# Patient Record
Sex: Female | Born: 1940
Health system: Southern US, Community
[De-identification: ages and names within clinical notes are randomized; demographics above are authoritative.]

## PROBLEM LIST (undated history)

## (undated) DIAGNOSIS — R519 Headache, unspecified: Secondary | ICD-10-CM

## (undated) DIAGNOSIS — J339 Nasal polyp, unspecified: Secondary | ICD-10-CM

## (undated) DIAGNOSIS — K589 Irritable bowel syndrome without diarrhea: Secondary | ICD-10-CM

## (undated) DIAGNOSIS — Z860100 Personal history of colon polyps, unspecified: Secondary | ICD-10-CM

## (undated) DIAGNOSIS — M419 Scoliosis, unspecified: Secondary | ICD-10-CM

## (undated) DIAGNOSIS — Z87442 Personal history of urinary calculi: Secondary | ICD-10-CM

## (undated) DIAGNOSIS — I2510 Atherosclerotic heart disease of native coronary artery without angina pectoris: Secondary | ICD-10-CM

## (undated) DIAGNOSIS — M7552 Bursitis of left shoulder: Secondary | ICD-10-CM

## (undated) DIAGNOSIS — R079 Chest pain, unspecified: Secondary | ICD-10-CM

## (undated) DIAGNOSIS — G500 Trigeminal neuralgia: Secondary | ICD-10-CM

## (undated) DIAGNOSIS — G9600 Cerebrospinal fluid leak, unspecified: Secondary | ICD-10-CM

## (undated) DIAGNOSIS — R51 Headache: Secondary | ICD-10-CM

## (undated) DIAGNOSIS — E785 Hyperlipidemia, unspecified: Secondary | ICD-10-CM

## (undated) DIAGNOSIS — E538 Deficiency of other specified B group vitamins: Secondary | ICD-10-CM

## (undated) DIAGNOSIS — N6019 Diffuse cystic mastopathy of unspecified breast: Secondary | ICD-10-CM

## (undated) DIAGNOSIS — K5790 Diverticulosis of intestine, part unspecified, without perforation or abscess without bleeding: Secondary | ICD-10-CM

## (undated) DIAGNOSIS — H9313 Tinnitus, bilateral: Secondary | ICD-10-CM

## (undated) DIAGNOSIS — K219 Gastro-esophageal reflux disease without esophagitis: Secondary | ICD-10-CM

## (undated) DIAGNOSIS — T8859XA Other complications of anesthesia, initial encounter: Secondary | ICD-10-CM

## (undated) DIAGNOSIS — M5030 Other cervical disc degeneration, unspecified cervical region: Secondary | ICD-10-CM

## (undated) DIAGNOSIS — T4145XA Adverse effect of unspecified anesthetic, initial encounter: Secondary | ICD-10-CM

## (undated) DIAGNOSIS — J329 Chronic sinusitis, unspecified: Secondary | ICD-10-CM

## (undated) DIAGNOSIS — Z8719 Personal history of other diseases of the digestive system: Secondary | ICD-10-CM

## (undated) DIAGNOSIS — N189 Chronic kidney disease, unspecified: Secondary | ICD-10-CM

## (undated) HISTORY — DX: Hyperlipidemia, unspecified: E78.5

## (undated) HISTORY — PX: NASAL SINUS SURGERY: SHX719

## (undated) HISTORY — DX: Gastro-esophageal reflux disease without esophagitis: K21.9

## (undated) HISTORY — DX: Scoliosis, unspecified: M41.9

## (undated) HISTORY — PX: CATARACT EXTRACTION: SUR2

## (undated) HISTORY — PX: JOINT REPLACEMENT: SHX530

## (undated) HISTORY — DX: Other cervical disc degeneration, unspecified cervical region: M50.30

## (undated) HISTORY — DX: Personal history of colon polyps, unspecified: Z86.0100

## (undated) HISTORY — DX: Nasal polyp, unspecified: J33.9

## (undated) HISTORY — DX: Chest pain, unspecified: R07.9

## (undated) HISTORY — DX: Chronic sinusitis, unspecified: J32.9

## (undated) HISTORY — PX: KIDNEY STONE SURGERY: SHX686

## (undated) HISTORY — PX: EYE SURGERY: SHX253

## (undated) HISTORY — DX: Diffuse cystic mastopathy of unspecified breast: N60.19

## (undated) HISTORY — PX: APPENDECTOMY: SHX54

## (undated) HISTORY — DX: Irritable bowel syndrome, unspecified: K58.9

## (undated) HISTORY — DX: Personal history of colonic polyps: Z86.010

## (undated) HISTORY — PX: ABDOMINAL HYSTERECTOMY: SHX81

## (undated) HISTORY — PX: CARDIAC CATHETERIZATION: SHX172

## (undated) HISTORY — DX: Diverticulosis of intestine, part unspecified, without perforation or abscess without bleeding: K57.90

## (undated) HISTORY — DX: Bursitis of left shoulder: M75.52

---

## 1972-09-14 HISTORY — PX: OTHER SURGICAL HISTORY: SHX169

## 1980-09-14 HISTORY — PX: BACK SURGERY: SHX140

## 2000-09-14 HISTORY — PX: CHOLECYSTECTOMY: SHX55

## 2000-11-01 ENCOUNTER — Encounter: Payer: Self-pay | Admitting: Neurosurgery

## 2000-11-01 ENCOUNTER — Ambulatory Visit (HOSPITAL_COMMUNITY): Admission: RE | Admit: 2000-11-01 | Discharge: 2000-11-01 | Payer: Self-pay | Admitting: Neurosurgery

## 2000-12-07 ENCOUNTER — Encounter: Payer: Self-pay | Admitting: Neurosurgery

## 2000-12-07 ENCOUNTER — Ambulatory Visit (HOSPITAL_COMMUNITY): Admission: RE | Admit: 2000-12-07 | Discharge: 2000-12-07 | Payer: Self-pay | Admitting: Neurosurgery

## 2000-12-20 ENCOUNTER — Encounter: Payer: Self-pay | Admitting: Neurosurgery

## 2000-12-20 ENCOUNTER — Encounter: Admission: RE | Admit: 2000-12-20 | Discharge: 2000-12-20 | Payer: Self-pay | Admitting: Neurosurgery

## 2001-01-03 ENCOUNTER — Encounter: Admission: RE | Admit: 2001-01-03 | Discharge: 2001-01-03 | Payer: Self-pay | Admitting: Neurosurgery

## 2001-01-03 ENCOUNTER — Encounter: Payer: Self-pay | Admitting: Neurosurgery

## 2002-01-23 ENCOUNTER — Encounter: Payer: Self-pay | Admitting: *Deleted

## 2002-01-23 ENCOUNTER — Inpatient Hospital Stay (HOSPITAL_COMMUNITY): Admission: EM | Admit: 2002-01-23 | Discharge: 2002-01-25 | Payer: Self-pay | Admitting: *Deleted

## 2002-09-14 HISTORY — PX: KNEE SURGERY: SHX244

## 2004-08-11 ENCOUNTER — Other Ambulatory Visit: Payer: Self-pay

## 2004-08-11 ENCOUNTER — Ambulatory Visit: Payer: Self-pay | Admitting: Otolaryngology

## 2004-08-12 ENCOUNTER — Ambulatory Visit: Payer: Self-pay | Admitting: Otolaryngology

## 2004-10-03 ENCOUNTER — Ambulatory Visit: Payer: Self-pay | Admitting: Otolaryngology

## 2004-11-04 ENCOUNTER — Ambulatory Visit: Payer: Self-pay

## 2005-04-03 ENCOUNTER — Ambulatory Visit: Payer: Self-pay | Admitting: Unknown Physician Specialty

## 2006-08-10 ENCOUNTER — Encounter: Admission: RE | Admit: 2006-08-10 | Discharge: 2006-08-10 | Payer: Self-pay | Admitting: Orthopedic Surgery

## 2006-09-02 ENCOUNTER — Encounter: Admission: RE | Admit: 2006-09-02 | Discharge: 2006-09-02 | Payer: Self-pay | Admitting: Orthopedic Surgery

## 2007-04-25 ENCOUNTER — Ambulatory Visit: Payer: Self-pay | Admitting: General Practice

## 2007-05-17 ENCOUNTER — Encounter: Admission: RE | Admit: 2007-05-17 | Discharge: 2007-05-17 | Payer: Self-pay | Admitting: Orthopedic Surgery

## 2007-05-25 ENCOUNTER — Ambulatory Visit: Payer: Self-pay | Admitting: Ophthalmology

## 2007-06-16 ENCOUNTER — Encounter: Admission: RE | Admit: 2007-06-16 | Discharge: 2007-06-16 | Payer: Self-pay | Admitting: Orthopedic Surgery

## 2007-07-12 ENCOUNTER — Encounter: Admission: RE | Admit: 2007-07-12 | Discharge: 2007-07-12 | Payer: Self-pay | Admitting: Orthopedic Surgery

## 2007-08-03 ENCOUNTER — Ambulatory Visit: Payer: Self-pay | Admitting: Urology

## 2007-08-24 ENCOUNTER — Ambulatory Visit: Payer: Self-pay | Admitting: Urology

## 2007-12-01 ENCOUNTER — Ambulatory Visit: Payer: Self-pay | Admitting: Urology

## 2007-12-19 ENCOUNTER — Ambulatory Visit: Payer: Self-pay | Admitting: Cardiology

## 2007-12-27 ENCOUNTER — Ambulatory Visit: Payer: Self-pay

## 2008-01-03 ENCOUNTER — Ambulatory Visit: Payer: Self-pay | Admitting: Cardiology

## 2008-05-23 ENCOUNTER — Ambulatory Visit: Payer: Self-pay | Admitting: Urology

## 2008-09-21 ENCOUNTER — Ambulatory Visit: Payer: Self-pay | Admitting: Otolaryngology

## 2008-09-27 ENCOUNTER — Ambulatory Visit: Payer: Self-pay | Admitting: Otolaryngology

## 2008-11-28 ENCOUNTER — Ambulatory Visit: Payer: Self-pay | Admitting: Urology

## 2008-12-20 ENCOUNTER — Ambulatory Visit: Payer: Self-pay | Admitting: Otolaryngology

## 2009-02-20 DIAGNOSIS — R0602 Shortness of breath: Secondary | ICD-10-CM | POA: Insufficient documentation

## 2009-02-20 DIAGNOSIS — J45909 Unspecified asthma, uncomplicated: Secondary | ICD-10-CM | POA: Insufficient documentation

## 2009-02-20 DIAGNOSIS — J329 Chronic sinusitis, unspecified: Secondary | ICD-10-CM | POA: Insufficient documentation

## 2009-02-20 DIAGNOSIS — E782 Mixed hyperlipidemia: Secondary | ICD-10-CM

## 2009-02-20 DIAGNOSIS — K219 Gastro-esophageal reflux disease without esophagitis: Secondary | ICD-10-CM

## 2009-02-20 DIAGNOSIS — R079 Chest pain, unspecified: Secondary | ICD-10-CM | POA: Insufficient documentation

## 2009-05-17 ENCOUNTER — Ambulatory Visit: Payer: Self-pay | Admitting: Internal Medicine

## 2009-07-12 ENCOUNTER — Encounter: Admission: RE | Admit: 2009-07-12 | Discharge: 2009-07-12 | Payer: Self-pay | Admitting: Orthopedic Surgery

## 2009-07-23 ENCOUNTER — Encounter: Admission: RE | Admit: 2009-07-23 | Discharge: 2009-07-23 | Payer: Self-pay | Admitting: Orthopedic Surgery

## 2009-10-16 ENCOUNTER — Telehealth: Payer: Self-pay | Admitting: Cardiology

## 2009-10-18 ENCOUNTER — Ambulatory Visit: Payer: Self-pay | Admitting: Cardiology

## 2009-10-18 DIAGNOSIS — R609 Edema, unspecified: Secondary | ICD-10-CM | POA: Insufficient documentation

## 2009-11-05 ENCOUNTER — Encounter: Payer: Self-pay | Admitting: Cardiology

## 2009-11-05 ENCOUNTER — Ambulatory Visit (HOSPITAL_COMMUNITY): Admission: RE | Admit: 2009-11-05 | Discharge: 2009-11-05 | Payer: Self-pay | Admitting: Cardiology

## 2009-11-05 ENCOUNTER — Ambulatory Visit: Payer: Self-pay

## 2009-11-05 ENCOUNTER — Ambulatory Visit: Payer: Self-pay | Admitting: Cardiology

## 2009-12-04 ENCOUNTER — Ambulatory Visit: Payer: Self-pay | Admitting: Urology

## 2009-12-11 ENCOUNTER — Encounter: Admission: RE | Admit: 2009-12-11 | Discharge: 2009-12-11 | Payer: Self-pay | Admitting: Orthopedic Surgery

## 2010-04-11 ENCOUNTER — Ambulatory Visit: Payer: Self-pay | Admitting: Unknown Physician Specialty

## 2010-04-18 ENCOUNTER — Ambulatory Visit: Payer: Self-pay | Admitting: Urology

## 2010-07-02 ENCOUNTER — Ambulatory Visit: Payer: Self-pay | Admitting: Otolaryngology

## 2010-10-03 ENCOUNTER — Other Ambulatory Visit: Payer: Self-pay | Admitting: Ophthalmology

## 2010-10-14 NOTE — Assessment & Plan Note (Signed)
Summary: f2y per pt call/lg   Visit Type:  2 yr f/u  CC:  bilateral leg edema/tingling ...denies any cp or sob.  Current Medications (verified): 1)  Premarin 0.625 Mg Tabs (Estrogens Conjugated) .Marland Kitchen.. 1 Tab Every Other Day 2)  Prevacid 30 Mg Cpdr (Lansoprazole) .Marland Kitchen.. 1 Tab Two Times A Day 2-3 Times Weekly 3)  Topamax 100 Mg Tabs (Topiramate) .Marland Kitchen.. 1 1/2 Tab Once Daily 4)  Vitamin C 500 Mg Tabs (Ascorbic Acid) .Marland Kitchen.. 1 Tab Once Daily 5)  Vitamin E 400 Unit Caps (Vitamin E) .Marland Kitchen.. 1 Cap Once Daily 6)  Aspirin 81 Mg Tbec (Aspirin) .... Take One Tablet By Mouth Daily 7)  Fish Oil 1200 Mg Caps (Omega-3 Fatty Acids) .Marland Kitchen.. 1 Cap Once Daily 8)  Caltrate 600+d 600-400 Mg-Unit Tabs (Calcium Carbonate-Vitamin D) .Marland Kitchen.. 1 Tab Two Times A Day 9)  Hydrochlorothiazide 25 Mg Tabs (Hydrochlorothiazide) .Marland Kitchen.. 1 Tab 2-3 Times Weekly 10)  Gabapentin 300 Mg Caps (Gabapentin) .... 2 Caps As Needed 11)  Etodolac 200 Mg Caps (Etodolac)  Allergies (verified): 1)  ! Codeine  Past History:  Past Medical History: Last updated: 02/20/2009 CHEST PAIN-UNSPECIFIED (ICD-786.50) DYSPNEA ON EXERTION (ICD-786.09) HYPERLIPIDEMIA-MIXED (ICD-272.4) GERD (ICD-530.81) ASTHMA (ICD-493.90) SINUSITIS, CHRONIC (ICD-473.9)    Past Surgical History: Last updated: 02/20/2009  She has had back surgery in 1982, knee surgery   in 2004, and is having a lot of problems with her left knee,   cholecystectomy 2002, sinus procedures x4 in 1970, 1990, 1994, and 2005,   and hysterectomy in 1974.      Family History: Last updated: 02/20/2009 Siblings:  brother had a premature MI. Both parents died of heart-related problems, but her mother was 64 and her father was 24 years old.     Social History: Last updated: 02/20/2009 Retired .Marland Kitchen88 Married   Review of Systems       negative other than history of present illness  Vital Signs:  Patient profile:   70 year old female Height:      66 inches Weight:      149 pounds BMI:      24.14 Pulse rate:   66 / minute Pulse rhythm:   regular BP sitting:   122 / 70  (left arm) Cuff size:   regular  Vitals Entered By: Danielle Rankin, CMA (October 18, 2009 4:15 PM)   Physical Exam  General:  Well developed, well nourished, in no acute distress. Head:  normocephalic and atraumatic Eyes:  PERRLA/EOM intact; conjunctiva and lids normal. Ears:  TM's intact and clear with normal canals and hearing Neck:  Neck supple, no JVD. No masses, thyromegaly or abnormal cervical nodes. Chest Vyom Brass:  no deformities or breast masses noted Lungs:  Clear bilaterally to auscultation and percussion. Heart:  Non-displaced PMI, chest non-tender; regular rate and rhythm, S1, S2 without murmurs, rubs or gallops. Carotid upstroke normal, no bruit. Normal abdominal aortic size, no bruits. Femorals normal pulses, no bruits. Pedals normal pulses. No edema, no varicosities. Abdomen:  Bowel sounds positive; abdomen soft and non-tender without masses, organomegaly, or hernias noted. No hepatosplenomegaly. Msk:  Back normal, normal gait. Muscle strength and tone normal. Pulses:  pulses normal in all 4 extremities Extremities:  1+ edema above the sock line. There is no edema around either ankle or foot. Venous varicosities are present but not dramatic. There is no sign of DVT Neurologic:  Alert and oriented x 3. Skin:  Intact without lesions or rashes. Psych:  Normal affect.   Problems:  Medical Problems Added: 1)  Dx of Edema  (ICD-782.3)  EKG  Procedure date:  10/18/2009  Findings:      nsinus rhythm, low voltage  Impression & Recommendations:  Problem # 1:  EDEMA (ICD-782.3) Assessment New I suspect her pain is due to venous insufficiency. Will perform lower extremity venous Dopplers and a 2-D echocardiogram to rule out any sign of DVT and/or pulmonary hypertension right-sided dysfunction. She may need a stronger diuretic down the road if HCTZ is not controlling this. Orders: Echocardiogram  (Echo) Venous Duplex Lower Extremity (Venous Duplex Lower)  Other Orders: EKG w/ Interpretation (93000)  Patient Instructions: 1)  Your physician recommends that you schedule a follow-up appointment in: AS NEEDED 2)  Your physician recommends that you continue on your current medications as directed. Please refer to the Current Medication list given to you today. 3)  Your physician has requested that you have an echocardiogram.  Echocardiography is a painless test that uses sound waves to create images of your heart. It provides your doctor with information about the size and shape of your heart and how well your heart's chambers and valves are working.  This procedure takes approximately one hour. There are no restrictions for this procedure. 4)  Your physician has requested that you limit the intake of sodium (salt) in your diet to two grams daily. Please see [facility] handout. 5)  ELEVATE LEGS AS MUCH AS POSSIBLE  6)  Your physician has requested that you have a lower or upper extremity venous duplex.  This test is an ultrasound of the veins in the legs or arms.  It looks at venous blood flow that carries blood from the heart to the legs or arms.  Allow one hour for a Lower Venous exam.  Allow thirty minutes for an Upper Venous exam. There are no restrictions or special instructions.

## 2010-10-14 NOTE — Progress Notes (Signed)
Summary: pt wants to talk about the fluid in her legs  Phone Note Call from Patient Call back at Home Phone [phone]   Caller: Patient Reason for Call: Talk to Nurse Summary of Call: pt has a lot of fluid in her legs and wants to talk to someone Initial call taken by: Omer Jack,  October 16, 2009 4:03 PM  Follow-up for Phone Call        Spoke with pt who reports appt was made for her to see Dr. Daleen Squibb on 10/18/09 to evaluate pain and burning and fluid in her legs and that she has no additional questions.  Pt. denies SOB and states she weighs herself daily.  Follow-up by: Dossie Arbour, RN, BSN,  October 16, 2009 4:55 PM

## 2010-10-29 ENCOUNTER — Ambulatory Visit: Payer: Self-pay | Admitting: Urology

## 2010-10-30 ENCOUNTER — Ambulatory Visit: Payer: Self-pay | Admitting: Otolaryngology

## 2010-11-03 LAB — PATHOLOGY REPORT

## 2010-12-24 ENCOUNTER — Ambulatory Visit: Payer: Self-pay | Admitting: Ophthalmology

## 2011-01-20 ENCOUNTER — Ambulatory Visit: Payer: Self-pay | Admitting: Sports Medicine

## 2011-01-27 NOTE — Assessment & Plan Note (Signed)
[facility]                             OFFICE NOTE   NAME:Dedominicis, CHOSEN GARRON                         MRN:          [id number]  DATE:01/03/2008                            DOB:          04/15/1941    Jocelyn Hooper returns today to discuss the findings of her stress Myoview.  I saw her on December 29, 2007, for chest discomfort and dyspnea on  exertion.   She had an adenosine rest stress Myoview on December 27, 2007.  This showed  an EF of 79%, normal contractility and thickening in all areas of  myocardium and no evidence of ischemia.   I have reviewed the findings of the study and its implications in detail  using a heart model with Jocelyn Hooper.  I have answered all of her  questions.   Her blood pressure today is 102/60, her pulse is 64 and regular.  Her  weight is 151 and stable.  Rest of her exam is unremarkable.   We will see her back on a p.r.n. basis.  She is obviously cleared for  any orthopedic surgery she needs on her knee.     Thomas C. Daleen Squibb, MD, [facility]  Electronically Signed    TCW/MedQ  DD: 01/03/2008  DT: 01/03/2008  Job #: [id number]   cc:   Alonna Buckler, M.D.

## 2011-01-27 NOTE — Assessment & Plan Note (Signed)
[facility]                            [place] OFFICE NOTE   NAME:Jocelyn Hooper, Jocelyn Hooper                         MRN:          [id number]  DATE:12/19/2007                            DOB:          Sep 26, 1940    We were asked by Dr. Alonna Buckler to consult on [facility] with chest  discomfort and dyspnea on exertion.   HISTORY OF PRESENT ILLNESS:  She is 70 years of age, married white  female.  Her only cardiac risk factors are age and a strong family  history.   She was admitted at [facility] with substernal chest pain in May 2003.  A cardiac cath showed no significant coronary disease at that time.   She has been having substernal chest discomfort or pressure.  She  notices this mostly after she eats.  She has also had some aching in her  shoulder which she thinks that is arthritis.  She has mostly dyspnea on  exertion which is most concerning.   Her cardiac risk factors, otherwise, are unremarkable, including she  does not smoke, does not have hypertension, diabetes, and is only  marginally overweight.   ALLERGIES/SENSITIVITIES:  She is CODEINE sensitive.  She had allergic  reaction to DYE in the past.   MEDICATIONS:  1. Premarin 0.625 mg p.o. daily.  2. Prevacid 30 mg a day.  3. Topamax 150 mg a day.  4. Aspirin 81 mg a day.  5. Vitamin C, E.  6. Fish oil.  7. Caltrate and D.   PAST SURGICAL HISTORY:  She has had back surgery in 1982, knee surgery  in 2004, and is having a lot of problems with her left knee,  cholecystectomy 2002, sinus procedures x4 in 1970, 1990, 1994, and 2005,  and hysterectomy in 1974.   FAMILY HISTORY:  Her brother had a premature MI.  Both parents died of  heart-related problems, but her mother was 98 and her father was 16  years old.   SOCIAL HISTORY:  She is retired since 1997.  She has two children.  She  is married.  She lives in the area.   REVIEW OF SYSTEMS:  Other than the HPI is positive for seasonal  allergies and history of gastroesophageal reflux.  Other Review of  Systems questioned and are negative.   PHYSICAL EXAMINATION:  VITAL SIGNS:  Today blood pressure is 122/66,  heart rate 75.  EKG is normal except for low voltage.  She is 5 feet 6  inches, weighs 151 pounds.  HEENT:  Normocephalic, atraumatic.  PERRLA.  Extraocular movements  intact.  Sclerae are clear.  Facial symmetry is normal.  NECK:  Carotid upstrokes are equal bilaterally without bruits, no JVD.  Thyroid is not enlarged.  Trachea is midline.  LUNGS:  Clear.  HEART:  Reveals a normal S1, S2.  PMI is nondisplaced.  There is no  murmur.  ABDOMEN:  Soft, good bowel sounds.  No midline bruit.  No hepatomegaly.  EXTREMITIES:  With no cyanosis, clubbing or edema.  Pulses are intact.  NEUROLOGICAL:  Intact.  SKIN:  Unremarkable.   ASSESSMENT/PLAN:  Jocelyn Hooper has risk factors of age and strong family  history and dyspnea on exertion.  I think her other chest discomfort is  probably reflux related.  She may have some arthritic pain in her  shoulders.   PLAN:  Adenosine rest stress Myoview.  She prefers this approach since  she is having a lot of problems with her left knee.  She may need some  surgical intervention on that knee in the future, and this would also  clear her if her Myoview is negative.     Thomas C. Daleen Squibb, MD, [facility]  Electronically Signed    TCW/MedQ  DD: 12/19/2007  DT: 12/19/2007  Job #: [id number]   cc:   Alonna Buckler, M.D.

## 2011-01-28 ENCOUNTER — Other Ambulatory Visit: Payer: Self-pay | Admitting: Orthopedic Surgery

## 2011-01-28 DIAGNOSIS — M545 Low back pain: Secondary | ICD-10-CM

## 2011-01-29 ENCOUNTER — Ambulatory Visit
Admission: RE | Admit: 2011-01-29 | Discharge: 2011-01-29 | Disposition: A | Discharge status: Home / Self Care | Payer: Medicare Other | Source: Ambulatory Visit | Attending: Orthopedic Surgery | Admitting: Orthopedic Surgery

## 2011-01-29 DIAGNOSIS — M545 Low back pain: Secondary | ICD-10-CM

## 2011-01-30 NOTE — Discharge Summary (Signed)
NAME:  Jocelyn Hooper, Jocelyn Hooper NO.:  [id number]   MEDICAL RECORD NO.:  [id number]                   PATIENT TYPE:   LOCATION:                                       FACILITY:  [facility]   PHYSICIAN:  Dante Gang, M.D.               DATE OF BIRTH:  Jun 21, 1941   DATE OF ADMISSION:  01/23/2002  DATE OF DISCHARGE:  01/25/2002                                 DISCHARGE SUMMARY   PROBLEM LIST:  1. Chest pain.  2. Prolonged Q-T.  3. Dizziness.  4. Hypercholesterolemia.  5. Gastroesophageal reflux.  6. Chronic sinusitis.  7. Asthma.  8. Status post colon polypectomy in March of 2003.  9. Cholecystectomy.  10.      Back surgery.  11.      History of partial hysterectomy.   MEDICATIONS AT DISCHARGE:  1. Darvocet-N 100 one p.o. q.4h. p.r.n. pain.  2. Singulair 10 mg q.d.  3. Premarin 0.625 mg q.d.  4. Prevacid 30 mg q.d.  5. Advair 100/50 mcg one puff b.i.d.  6. Flonase every day.   ADMITTING HISTORY:  The patient is a 70 year old woman who presented to the  emergency department complaining of chest pressure and tightness that had  been constant since the night before.  She has sweating along with this as  well as dizziness and nausea for several days beforehand.  She was still  feeling dizzy, especially when bending over.  Pain radiated to her back and  left shoulder.  She felt some numbness in her left arm.  She had no  vomiting, did have nausea and headache.   PHYSICAL EXAMINATION:  Temperature 96.8, blood pressure 138/82, pulse 80,  respiratory rate 24 and pulse oximetry 100% on room air.  This was a 70-year-  old woman in no acute distress.  Pupils were equal, round and reactive to  light and extraocular movements intact.  Neck was supple without  lymphadenopathy or JVD.  Lungs were clear to auscultation bilaterally.  Heart was regular without murmurs.  Abdomen was soft and nontender with  positive bowel sounds.  Extremities show 2+ pulses and no edema.   Neuro exam  showed her to be alert and oriented x3 with no focal deficits.   LABORATORY AND ACCESSORY CLINICAL DATA:  White count 7.2, hemoglobin 13.6,  platelets 255,000.  Sodium 137, potassium 4.3, chloride 106, bicarb 23, BUN  11, creatinine 0.7, glucose 100, calcium 9.3.  PT 12.9, INR 0.9, PTT 28.  CK  is 42, CK-MB 0.8, troponin I was less than 0.01.   Chest x-ray was unremarkable.   EKG showed normal sinus rhythm with a prolonged Q-T interval and no changes  consistent with acute ischemia.   HOSPITAL COURSE:  CHEST PAIN:  The patient was admitted and placed on  telemetry.  She had two episodes of pain overnight, one of which was 7 a.m.  when she got up to go to the bathroom; this pain was sudden and sharp.  Another episode when she later got up to go to the bathroom was more dull  and pressure-like with numbness in her legs and left arm; this was relieved  within seconds with sublingual nitroglycerin.   Subsequent cardiac enzymes continued to be negative.  Twelve-lead EKG showed  no further changes overnight.  Given that the pain was very typical anginal  pain,  Cardiology was consulted and the patient was seen by Dr. Vernie Shanks. De[place]ent.  He believed the patient's history was concerning for coronary  insufficiency and he recommended cardiac cath due to a high pre-test  probability.   Cardiac cath was performed on May 14th and the patient was found to have no  significant coronary disease.  Cardiology believed that the chest pain was  almost certainly noncardiac.  The patient was discharged home on the 14th on  her previous medications, plus some Darvocet for the pain.   FOLLOWUP:  She had seen a gastroenterologist in the past through the  [facility] and considering that this was the next most likely etiology  of her pain, she planned to return to see that physician.  She was also  going to follow up with her primary physician, Dr. Shayne Alken at [place].                                                Dante Gang, M.D.    JP/MEDQ  D:  05/18/2002  T:  05/20/2002  Job:  [id number]

## 2011-01-30 NOTE — Consult Note (Signed)
[facility]  Patient:    Jocelyn Hooper, Jocelyn Hooper Visit Number: [id number] MRN: [id number]          Service Type: MED Location: 4700 [id number] Attending Physician:  Edwyna Perfect Dictated by:   Lewayne Bunting, M.D. [facility] Proc. Date: 01/24/02 Admit Date:  01/23/2002 Discharge Date: 01/25/2002   CC:         C. Ulyess Mort, M.D.  Dr. Shayne Alken at the [facility]   Consultation Report  DATE OF BIRTH:  [birth year].  REFERRING PHYSICIAN:  C. Ulyess Mort, M.D.  CHIEF COMPLAINT:  Substernal chest pain.  HISTORY OF PRESENT ILLNESS:  Ms. Wirsing is a 70 year old female with strong family history of coronary artery disease.  The patient presented with substernal chest pain as well as left arm and shoulder pain to the medicine teaching service.  The patient has reported this for approximately one week in duration.  However, on Sunday when she was playing dominoes, she felt a heavy sensation in the chest associated with shortness of breath and left-sided arm pain and was rather concerned about this.  The patient then presented to the emergency room.  At that time, she was pain-free.  Today, the patient had another episode of substernal chest pain on exertion approximately 3 oclock this morning when walking from the bed to the commode. This occurred on several occasions and the patient received sublingual oxygen and nitroglycerin with resolution of her chest pressure.  She states for the last several weeks she has not felt "right."  She also has been feeling hot at night with sensation of heart pounding with laying on the left side.  The patient was ruled out for myocardial infarction with negative enzymes and had no significant EKG changes.  ALLERGIES:  CODEINE causes nausea.  MORPHINE SULFATE causes insomnia. PREDNISONE:  Causes increased heart rate.  MEDICATIONS: 1. Singulair 10 mg q.d. 2. Premarin 0.625 mg p.o. q.d. 3. Prevacid 30 mg q.d. 4. Advair  q.d. 5. Flonase q.d.  PAST MEDICAL HISTORY:  History of hyperlipidemia.  History of hiatal hernia and GERD.  History of sinus problems.  History of scoliosis.  History of colonoscopy and polypectomy.  History of back surgery.  History of partial hysterectomy.  History of myelogram x 2.  SOCIAL HISTORY:  The patient lives in [place] with her husband.  She is currently not employed.  She does not smoke or drink.  FAMILY HISTORY:  Mother died at age 41 but had her first myocardial infarction in her 71s.  Father died at age 53 but had bypass surgery at age 45.  Brother with CAD at age 64.  REVIEW OF SYSTEMS:  Positive for sweats.  No headaches or sore throat, rash, or lesions.  Positive for chest pain and mild dizziness but no dyspnea on exertion or PND.  No frequency or dysuria.  No weakness or numbness.  No myalgias or arthralgias.  No nausea or vomiting.  No polyuria or polydipsia.  PHYSICAL EXAMINATION:  VITAL SIGNS:  Blood pressure 100/56, pulse 80, respirations are 20, temperature is 98.6.  GENERAL:  A 70 year old white female in no apparent distress.  HEENT:  NCAT.  PERRLA.  EOMI.  NECK:  Supple.  No lymphadenopathy.  HEART:  Regular rate and rhythm.  Normal S1 and S2.  LUNGS:  Clear.  SKIN:  No rash or lesions.  ABDOMEN:  Soft and nontender.  GENITOURINARY:  Deferred.  RECTAL:  Deferred.  EXTREMITIES:  No clubbing, cyanosis,  or edema.  NEUROLOGICAL:  The patient is alert, oriented, and grossly nonfocal.  LABORATORY DATA:  Chest x-ray shows no acute abnormalities.  EKG shows normal sinus rhythm at 78 beats per minute, normal PR and QRS interval.  Hemoglobin 13.6, white count 7.2, platelets 255,000.  Potassium 4.3, creatinine 0.7.  Normal LFTs.  Troponins negative x 3.  IMPRESSION: 1. Substernal chest pain:  The patients history is very concerning for    coronary insufficiency.  She does have a strong family history of coronary    artery disease:  I have  recommended that the patient proceed with cardiac    catheterization due to a relatively high pretest probability.  The patient    is rather concern and also will feel more comfortable with more aggressive    workup.  She understands the risks and benefits of diagnostic    catheterization and she is willing to proceed.  This was discussed at    length with her and husband and all risks for elective cardiac    catheterization has been explained carefully including vascular    complications, myocardial infarction, stroke, and risks of death    from diagnostic catheterization. 2. Gastroesophageal reflux disease. 3. History of asthma.  DISPOSITION:  The patient will proceed with the cardiac catheterization in the morning. Dictated by:   Lewayne Bunting, M.D. [facility] Attending Physician:  Edwyna Perfect DD:  01/24/02 TD:  01/25/02 Job: [id number]

## 2011-02-03 ENCOUNTER — Ambulatory Visit: Payer: Self-pay | Admitting: Urology

## 2011-03-05 ENCOUNTER — Encounter: Payer: Self-pay | Admitting: Cardiology

## 2011-03-09 ENCOUNTER — Ambulatory Visit: Payer: Self-pay | Admitting: Urology

## 2011-03-12 ENCOUNTER — Encounter: Payer: Self-pay | Admitting: Cardiology

## 2011-08-14 ENCOUNTER — Other Ambulatory Visit: Payer: Self-pay | Admitting: Ophthalmology

## 2011-12-02 ENCOUNTER — Ambulatory Visit: Payer: Self-pay | Admitting: Urology

## 2011-12-26 ENCOUNTER — Emergency Department: Payer: Self-pay | Admitting: Emergency Medicine

## 2011-12-27 LAB — URINALYSIS, COMPLETE
Blood: NEGATIVE
Glucose,UR: NEGATIVE mg/dL (ref 0–75)
Ph: 8 (ref 4.5–8.0)
Protein: NEGATIVE
RBC,UR: 6 /HPF (ref 0–5)
Squamous Epithelial: 1

## 2011-12-31 ENCOUNTER — Ambulatory Visit: Payer: Self-pay | Admitting: Urology

## 2012-01-14 ENCOUNTER — Ambulatory Visit: Payer: Self-pay | Admitting: Urology

## 2012-06-01 ENCOUNTER — Ambulatory Visit: Payer: Self-pay | Admitting: Urology

## 2012-06-01 DIAGNOSIS — N200 Calculus of kidney: Secondary | ICD-10-CM | POA: Insufficient documentation

## 2012-06-01 DIAGNOSIS — M5430 Sciatica, unspecified side: Secondary | ICD-10-CM | POA: Insufficient documentation

## 2012-06-01 DIAGNOSIS — N23 Unspecified renal colic: Secondary | ICD-10-CM | POA: Insufficient documentation

## 2012-07-27 ENCOUNTER — Ambulatory Visit: Payer: Self-pay | Admitting: Urology

## 2012-08-26 ENCOUNTER — Ambulatory Visit: Payer: Self-pay | Admitting: Urology

## 2012-08-29 ENCOUNTER — Ambulatory Visit: Payer: Self-pay | Admitting: General Practice

## 2012-08-29 LAB — PROTIME-INR
INR: 0.8
Prothrombin Time: 11.7 secs (ref 11.5–14.7)

## 2012-08-29 LAB — BASIC METABOLIC PANEL
Anion Gap: 6 — ABNORMAL LOW (ref 7–16)
BUN: 17 mg/dL (ref 7–18)
Calcium, Total: 9.1 mg/dL (ref 8.5–10.1)
Chloride: 116 mmol/L — ABNORMAL HIGH (ref 98–107)
Co2: 24 mmol/L (ref 21–32)
Osmolality: 292 (ref 275–301)
Potassium: 4.4 mmol/L (ref 3.5–5.1)

## 2012-08-29 LAB — URINALYSIS, COMPLETE
Bacteria: NONE SEEN
Blood: NEGATIVE
Glucose,UR: NEGATIVE mg/dL (ref 0–75)
Ketone: NEGATIVE
Specific Gravity: 1.015 (ref 1.003–1.030)
Squamous Epithelial: 1
WBC UR: NONE SEEN /HPF (ref 0–5)

## 2012-08-29 LAB — CBC
Platelet: 190 10*3/uL (ref 150–440)
RBC: 3.99 10*6/uL (ref 3.80–5.20)
WBC: 6.4 10*3/uL (ref 3.6–11.0)

## 2012-08-29 LAB — SEDIMENTATION RATE: Erythrocyte Sed Rate: 6 mm/hr (ref 0–30)

## 2012-08-30 LAB — URINE CULTURE

## 2012-08-31 ENCOUNTER — Other Ambulatory Visit: Payer: Self-pay | Admitting: Nurse Practitioner

## 2012-09-12 ENCOUNTER — Inpatient Hospital Stay: Payer: Self-pay | Admitting: General Practice

## 2012-09-13 LAB — BASIC METABOLIC PANEL
BUN: 9 mg/dL (ref 7–18)
Calcium, Total: 7.8 mg/dL — ABNORMAL LOW (ref 8.5–10.1)
Chloride: 113 mmol/L — ABNORMAL HIGH (ref 98–107)
Co2: 20 mmol/L — ABNORMAL LOW (ref 21–32)
Creatinine: 0.84 mg/dL (ref 0.60–1.30)
EGFR (African American): >60
Glucose: 106 mg/dL — ABNORMAL HIGH (ref 65–99)
Osmolality: 280 (ref 275–301)
Potassium: 3.8 mmol/L (ref 3.5–5.1)
Sodium: 141 mmol/L (ref 136–145)

## 2012-09-13 LAB — PLATELET COUNT: Platelet: 147 10*3/uL — ABNORMAL LOW (ref 150–440)

## 2012-09-14 LAB — BASIC METABOLIC PANEL
BUN: 8 mg/dL (ref 7–18)
Chloride: 113 mmol/L — ABNORMAL HIGH (ref 98–107)
Co2: 21 mmol/L (ref 21–32)
Creatinine: 0.75 mg/dL (ref 0.60–1.30)
EGFR (African American): >60
EGFR (Non-African Amer.): >60
Glucose: 75 mg/dL (ref 65–99)
Osmolality: 276 (ref 275–301)

## 2012-09-14 LAB — URINE CULTURE

## 2012-12-07 ENCOUNTER — Ambulatory Visit: Payer: Self-pay | Admitting: Urology

## 2012-12-19 ENCOUNTER — Ambulatory Visit: Payer: Self-pay | Admitting: Unknown Physician Specialty

## 2012-12-20 LAB — PATHOLOGY REPORT

## 2013-01-04 ENCOUNTER — Ambulatory Visit: Payer: Self-pay | Admitting: Unknown Physician Specialty

## 2013-03-08 ENCOUNTER — Ambulatory Visit: Payer: Self-pay | Admitting: Urology

## 2013-05-22 ENCOUNTER — Ambulatory Visit: Payer: Self-pay | Admitting: Otolaryngology

## 2013-08-08 ENCOUNTER — Ambulatory Visit: Payer: Self-pay | Admitting: Urology

## 2013-08-16 ENCOUNTER — Ambulatory Visit: Payer: Self-pay | Admitting: Urology

## 2013-08-22 ENCOUNTER — Ambulatory Visit: Payer: Self-pay | Admitting: Urology

## 2013-08-22 ENCOUNTER — Ambulatory Visit: Payer: Self-pay | Admitting: Otolaryngology

## 2013-09-14 HISTORY — PX: TONSILLECTOMY: SUR1361

## 2013-10-16 ENCOUNTER — Ambulatory Visit: Payer: Self-pay | Admitting: Otolaryngology

## 2013-10-26 ENCOUNTER — Ambulatory Visit (INDEPENDENT_AMBULATORY_CARE_PROVIDER_SITE_OTHER): Payer: Medicare Other | Admitting: Podiatry

## 2013-10-26 ENCOUNTER — Encounter: Payer: Self-pay | Admitting: Podiatry

## 2013-10-26 VITALS — BP 106/59 | HR 73 | Resp 16 | Ht 66.0 in | Wt 152.0 lb

## 2013-10-26 DIAGNOSIS — G5760 Lesion of plantar nerve, unspecified lower limb: Secondary | ICD-10-CM

## 2013-10-26 DIAGNOSIS — G588 Other specified mononeuropathies: Secondary | ICD-10-CM

## 2013-10-26 NOTE — Progress Notes (Signed)
She presents today after have not seen her for approximately one year with a chief complaint of recurrence of her neuroma to her third interdigital space of her left foot. And she points to this area. She states that now she has radiating pain up and distal aspect of the third digit and fourth digit of the left foot. She denies any change in her past medical history medications allergies surgeries and social history.  Objective: Vital signs are stable she is alert and oriented x3. There is no erythema edema saline is drainage or odor. Pulses are strongly palpable left. Positive Mulder's click to the third interdigital space with pain.  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  Plan: Discussed etiology pathology conservative versus surgical therapies at this point I am suggesting that we start with a dehydrated alcohol injection and we will followup with [place] in 3 weeks. We dispensed the first injection today.

## 2013-11-16 ENCOUNTER — Ambulatory Visit (INDEPENDENT_AMBULATORY_CARE_PROVIDER_SITE_OTHER): Payer: Medicare Other | Admitting: Podiatry

## 2013-11-16 VITALS — BP 127/75 | HR 70 | Resp 16 | Ht 66.0 in | Wt 150.0 lb

## 2013-11-16 DIAGNOSIS — G5760 Lesion of plantar nerve, unspecified lower limb: Secondary | ICD-10-CM

## 2013-11-16 DIAGNOSIS — G588 Other specified mononeuropathies: Secondary | ICD-10-CM

## 2013-11-16 NOTE — Progress Notes (Signed)
She presents today for followup of her painful neuroma.  Objective: Vital signs are stable she is alert and oriented x3. Pulses are strongly palpable left foot. Still has a palpable Mulder's click to the third interdigital space of the left foot.  Assessment: Neuroma third interdigital space left foot.  Plan: Injected her second dose of dehydrated alcohol to the third interdigital space today.

## 2013-12-07 ENCOUNTER — Ambulatory Visit (INDEPENDENT_AMBULATORY_CARE_PROVIDER_SITE_OTHER): Payer: Medicare Other | Admitting: Podiatry

## 2013-12-07 ENCOUNTER — Encounter: Payer: Self-pay | Admitting: Podiatry

## 2013-12-07 VITALS — BP 112/63 | HR 65 | Resp 16 | Ht 66.0 in | Wt 150.0 lb

## 2013-12-07 DIAGNOSIS — G588 Other specified mononeuropathies: Secondary | ICD-10-CM

## 2013-12-07 DIAGNOSIS — G5760 Lesion of plantar nerve, unspecified lower limb: Secondary | ICD-10-CM

## 2013-12-07 NOTE — Progress Notes (Signed)
She presents today for her neuroma third interdigital space of the left foot. She has had to alcohol injection states that she is no better as of yet.  Objective: Vital signs are stable she is alert and oriented x3. Pulses are palpable. Palpable Mulder's click to the third interdigital space of the left foot.  Assessment: Neuroma third interdigital space left foot.  Plan: Injected her third dose of dehydrated alcohol today to the third interdigital space of her left foot. Followup with her in 3 weeks.

## 2013-12-28 ENCOUNTER — Ambulatory Visit: Payer: Medicare Other | Admitting: Podiatry

## 2014-01-03 ENCOUNTER — Encounter: Payer: Self-pay | Admitting: Podiatry

## 2014-01-03 ENCOUNTER — Ambulatory Visit (INDEPENDENT_AMBULATORY_CARE_PROVIDER_SITE_OTHER): Payer: Medicare Other | Admitting: Podiatry

## 2014-01-03 ENCOUNTER — Telehealth: Payer: Self-pay | Admitting: *Deleted

## 2014-01-03 VITALS — BP 122/76 | HR 70 | Resp 16

## 2014-01-03 DIAGNOSIS — G5760 Lesion of plantar nerve, unspecified lower limb: Secondary | ICD-10-CM

## 2014-01-03 DIAGNOSIS — G588 Other specified mononeuropathies: Secondary | ICD-10-CM

## 2014-01-03 NOTE — Progress Notes (Signed)
Jocelyn Hooper presents today for followup of her neuroma third interdigital space of her left foot. She had her third alcohol injection last visit. She states it is doing some better. I am now able to wear high heels.  Objective: Vital signs are stable she is alert and oriented x3. Pulses are palpable left. Pain on palpation third interdigital space left.  Assessment: Neuroma third interdigital space left foot.  Plan: Discussed etiology pathology conservative versus surgical therapies reinjected with her fourth dose of dehydrated alcohol to the third interdigital space of the left foot today. Followup with her in 3 weeks at which time we'll week we will reinject with dehydrated alcohol. Also we will discuss the results of her fungal culture which come back negative.

## 2014-01-03 NOTE — Telephone Encounter (Signed)
CALLED PATIENT TO LET HER KNOW THAT HER RESULTS FOR TOENAIL WAS NEGATIVE FOR FUNGUS. WE CAN TRY NUVAIL IF SHE WANTS. PATIENT IS GOING TO CALL PHARMACIES TO SEE WHAT THE PRICE MAYBE

## 2014-01-22 ENCOUNTER — Ambulatory Visit (INDEPENDENT_AMBULATORY_CARE_PROVIDER_SITE_OTHER): Payer: Medicare Other | Admitting: Podiatry

## 2014-01-22 ENCOUNTER — Encounter: Payer: Self-pay | Admitting: Podiatry

## 2014-01-22 VITALS — BP 100/60 | HR 82 | Resp 16

## 2014-01-22 DIAGNOSIS — G5760 Lesion of plantar nerve, unspecified lower limb: Secondary | ICD-10-CM

## 2014-01-22 DIAGNOSIS — G588 Other specified mononeuropathies: Secondary | ICD-10-CM

## 2014-01-22 NOTE — Progress Notes (Signed)
She presents today for followup of her neuroma third interdigital space of the left foot. She states it may be a little bit better.  Objective: Vital signs are stable she is alert and oriented x3. Palpable Mulder's click to the third interdigital space of the left foot. This is indicative of a neuroma.  Assessment: Neuroma third interdigital space left foot.  Plan: Injection dehydrated alcohol #5 to the third interdigital space of the left foot.

## 2014-01-24 ENCOUNTER — Ambulatory Visit: Payer: Medicare Other | Admitting: Podiatry

## 2014-01-31 ENCOUNTER — Ambulatory Visit: Payer: Self-pay | Admitting: Anesthesiology

## 2014-01-31 DIAGNOSIS — Z01810 Encounter for preprocedural cardiovascular examination: Secondary | ICD-10-CM

## 2014-02-01 ENCOUNTER — Ambulatory Visit: Payer: Self-pay | Admitting: Otolaryngology

## 2014-02-02 LAB — PATHOLOGY REPORT

## 2014-02-19 ENCOUNTER — Ambulatory Visit (INDEPENDENT_AMBULATORY_CARE_PROVIDER_SITE_OTHER): Payer: Medicare Other | Admitting: Podiatry

## 2014-02-19 ENCOUNTER — Encounter: Payer: Self-pay | Admitting: Podiatry

## 2014-02-19 VITALS — BP 120/76 | HR 78 | Resp 16

## 2014-02-19 DIAGNOSIS — G588 Other specified mononeuropathies: Secondary | ICD-10-CM

## 2014-02-19 DIAGNOSIS — G5760 Lesion of plantar nerve, unspecified lower limb: Secondary | ICD-10-CM

## 2014-02-19 NOTE — Progress Notes (Signed)
She presents today for followup of her neuroma to the third interdigital space of her left foot. She states that the fifth injection seemed to work well and she's doing much better. She also underwent a tonsillectomy recently.  Objective: Vital signs are stable she is alert and oriented x3 she has pain on palpation to the third interdigital space of palpable Mulder's click but much decrease in previously noted.  Assessment: Neuroma third interdigital space of the left foot.  Plan: 6 dehydrated alcohol injection today third interdigital space left.

## 2014-03-12 ENCOUNTER — Ambulatory Visit (INDEPENDENT_AMBULATORY_CARE_PROVIDER_SITE_OTHER): Payer: Medicare Other | Admitting: Podiatry

## 2014-03-12 VITALS — BP 96/49 | HR 75 | Resp 16

## 2014-03-12 DIAGNOSIS — G5760 Lesion of plantar nerve, unspecified lower limb: Secondary | ICD-10-CM

## 2014-03-12 DIAGNOSIS — G5782 Other specified mononeuropathies of left lower limb: Secondary | ICD-10-CM

## 2014-03-12 NOTE — Progress Notes (Signed)
She presents today for another injection of her third interdigital space of her left foot she states that her left foot is feeling much much better. She's very happy with the outcome at this point.  Objective: She has minimal pain on palpation third interdigital space of the left foot. Palpable Mulder's click is present.  Assessment: Neuroma third interdigital space of the left foot.  Plan: Injected dehydrated alcohol third interdigital space left foot.

## 2014-04-02 ENCOUNTER — Ambulatory Visit (INDEPENDENT_AMBULATORY_CARE_PROVIDER_SITE_OTHER): Payer: Medicare Other | Admitting: Podiatry

## 2014-04-02 ENCOUNTER — Encounter: Payer: Self-pay | Admitting: Podiatry

## 2014-04-02 DIAGNOSIS — G5782 Other specified mononeuropathies of left lower limb: Secondary | ICD-10-CM

## 2014-04-02 DIAGNOSIS — G5760 Lesion of plantar nerve, unspecified lower limb: Secondary | ICD-10-CM

## 2014-04-02 NOTE — Progress Notes (Signed)
She presents today for followup of her neuroma third interdigital space of the left foot. Seems to be doing a whole lot better almost 100%.  Objective: Pulses are palpable left foot. Pain on palpation third interdigital space of the left foot. Palpable Mulder's click third interdigital space left foot.  Assessment: Neuroma third interdigital space left foot.  Plan: Injected dehydrated alcohol third interdigital space of the left foot will followup with her on 3 weeks if necessary.

## 2014-04-23 ENCOUNTER — Ambulatory Visit (INDEPENDENT_AMBULATORY_CARE_PROVIDER_SITE_OTHER): Payer: Medicare Other | Admitting: Podiatry

## 2014-04-23 VITALS — BP 105/51 | HR 84 | Resp 16

## 2014-04-23 DIAGNOSIS — G5782 Other specified mononeuropathies of left lower limb: Secondary | ICD-10-CM

## 2014-04-23 DIAGNOSIS — G5760 Lesion of plantar nerve, unspecified lower limb: Secondary | ICD-10-CM

## 2014-04-23 NOTE — Progress Notes (Signed)
She presents today for followup of her neuroma/neuritis third interdigital space of her left foot. She states that "it's feeling much better and I think this is the last one".  Objective: Vital signs are stable she is alert and oriented x3. She has pain on palpation to the third interdigital space of the left foot though very minimal.  Assessment: Resolving neuroma third interdigital space of her left foot.  Plan: Reinjected the third interdigital space today with dehydrated alcohol I will followup with her in 3 weeks if necessary.

## 2014-04-30 ENCOUNTER — Ambulatory Visit: Payer: Medicare Other | Admitting: Podiatry

## 2014-05-09 DIAGNOSIS — M5136 Other intervertebral disc degeneration, lumbar region: Secondary | ICD-10-CM | POA: Insufficient documentation

## 2014-05-14 ENCOUNTER — Ambulatory Visit: Payer: Medicare Other | Admitting: Podiatry

## 2014-06-13 ENCOUNTER — Ambulatory Visit: Payer: Medicare Other | Admitting: Cardiology

## 2014-06-29 ENCOUNTER — Encounter (INDEPENDENT_AMBULATORY_CARE_PROVIDER_SITE_OTHER): Payer: Self-pay

## 2014-06-29 ENCOUNTER — Encounter: Payer: Self-pay | Admitting: Cardiovascular Disease

## 2014-06-29 ENCOUNTER — Ambulatory Visit (INDEPENDENT_AMBULATORY_CARE_PROVIDER_SITE_OTHER): Payer: Medicare Other | Admitting: Cardiovascular Disease

## 2014-06-29 VITALS — BP 112/80 | HR 73 | Ht 66.0 in | Wt 152.5 lb

## 2014-06-29 DIAGNOSIS — R0789 Other chest pain: Secondary | ICD-10-CM

## 2014-06-29 DIAGNOSIS — E785 Hyperlipidemia, unspecified: Secondary | ICD-10-CM

## 2014-06-29 DIAGNOSIS — I499 Cardiac arrhythmia, unspecified: Secondary | ICD-10-CM

## 2014-06-29 DIAGNOSIS — R0602 Shortness of breath: Secondary | ICD-10-CM

## 2014-06-29 NOTE — Progress Notes (Signed)
Patient ID: Jocelyn Hooper, female    DOB: 1940-11-19, 73 y.o.   MRN: [id number]  HPI Comments: Jocelyn Hooper is a 73 year old woman with hyperlipidemia, degenerative disc disease, GERD, IBS, history of chest pain with normal cardiac catheterization may 2003 at [facility], strong family history of coronary artery disease, previously seen in our clinic for leg swelling, leg tingling who presents for symptoms of shortness of breath with exertion .  She reports that she does not do any regular exercise, is very sedentary. Her husband is very active . She does some activities around the house but no regular walking . When she does yard work, gardening, she feels short of breath at times . She denies any lower extremity edema, no weight gain. Denies having any significant chest tightness with exertion . She is concerned given her strong family history . She was previously on HCTZ. She's not take this anymore . She does continue to have slight tingling in her legs. She takes gabapentin  Prior lab work showed total cholesterol 218, LDL 112, HDL 77   EKG shows normal sinus rhythm with rate 73 beats per minute, no significant ST or T wave changes    Outpatient Encounter Prescriptions as of 06/29/2014  Medication Sig  . acetaminophen (TYLENOL) 500 MG tablet Take 500 mg by mouth every 4 (four) hours as needed.   Marland Kitchen aspirin (ASPIR-81) 81 MG EC tablet Take 81 mg by mouth daily.    . Calcium Carbonate-Vitamin D (CALTRATE 600+D) 600-400 MG-UNIT per tablet Take 1 tablet by mouth 2 (two) times daily.    . diphenhydrAMINE (BENADRYL) 25 mg capsule Take 25 mg by mouth at bedtime as needed.   Jocelyn Hooper Calcium (STOOL SOFTENER PO) Take by mouth daily.  Marland Kitchen estrogens, conjugated, (PREMARIN) 0.625 MG tablet Take 0.625 mg by mouth daily. Every other day   . fluticasone (FLONASE) 50 MCG/ACT nasal spray Place 2 sprays into both nostrils daily.   Marland Kitchen gabapentin (NEURONTIN) 300 MG capsule Take 300 mg by mouth as needed. Take 2 caps     . lansoprazole (PREVACID) 30 MG capsule Take 30 mg by mouth 2 (two) times daily before a meal.   . topiramate (TOPAMAX) 100 MG tablet Take 150 mg by mouth daily. 1 1/2 tab  . vitamin B-12 (CYANOCOBALAMIN) 1000 MCG tablet Take 1,000 mcg by mouth daily.  . vitamin C (ASCORBIC ACID) 500 MG tablet Take 500 mg by mouth daily.     Review of Systems  Constitutional: Negative.   HENT: Negative.   Eyes: Negative.   Respiratory: Positive for shortness of breath.   Cardiovascular: Negative.   Gastrointestinal: Negative.   Endocrine: Negative.   Musculoskeletal: Negative.        Tingling in her legs  Skin: Negative.   Allergic/Immunologic: Negative.   Neurological: Negative.   Hematological: Negative.   Psychiatric/Behavioral: Negative.   All other systems reviewed and are negative.   BP 112/80  Pulse 73  Ht 5\' 6"  (1.676 m)  Wt 152 lb 8 oz (69.174 kg)  BMI 24.63 kg/m2  Physical Exam  Nursing note and vitals reviewed. Constitutional: She is oriented to person, place, and time. She appears well-developed and well-nourished.  HENT:  Head: Normocephalic.  Nose: Nose normal.  Mouth/Throat: Oropharynx is clear and moist.  Eyes: Conjunctivae are normal. Pupils are equal, round, and reactive to light.  Neck: Normal range of motion. Neck supple. No JVD present.  Cardiovascular: Normal rate, regular rhythm, S1 normal, S2 normal, normal heart  sounds and intact distal pulses.  Exam reveals no gallop and no friction rub.   No murmur heard. Pulmonary/Chest: Effort normal and breath sounds normal. No respiratory distress. She has no wheezes. She has no rales. She exhibits no tenderness.  Abdominal: Soft. Bowel sounds are normal. She exhibits no distension. There is no tenderness.  Musculoskeletal: Normal range of motion. She exhibits no edema and no tenderness.  Lymphadenopathy:    She has no cervical adenopathy.  Neurological: She is alert and oriented to person, place, and time. Coordination  normal.  Skin: Skin is warm and dry. No rash noted. No erythema.  Psychiatric: She has a normal mood and affect. Her behavior is normal. Judgment and thought content normal.    Assessment and Plan

## 2014-06-29 NOTE — Assessment & Plan Note (Signed)
She will treat her cholesterol with diet, increase her oatmeal, red yeast rice If cholesterol continues to run high, we could consider adding low-dose statin if she would agree

## 2014-06-29 NOTE — Addendum Note (Signed)
Addended by: Minna Merritts on: 06/29/2014 02:13 PM   Modules accepted: Level of Service

## 2014-06-29 NOTE — Patient Instructions (Addendum)
Your next appointment will be scheduled in our new office located at :  [facility], [place], [place] [address]  For cramping, Try tonic water, potassium or magnesium  For cholesterol, Oatmeal, Red Yeast Rice  You are doing well. No medication changes were made.  We will schedule a stress echo for shortness of  breath  Please call us if you have new issues that need to be addressed before your next appt.

## 2014-06-29 NOTE — Assessment & Plan Note (Addendum)
Etiology of her symptoms is unclear. We have discussed the various options with her. She does have a very strong family history of coronary artery disease. We discussed treadmill echo, plain treadmill, treadmill Myoview . She prefers a treadmill echocardiogram stress study.  Symptoms could be also secondary to her deconditioning. This was discussed with her.

## 2014-07-19 ENCOUNTER — Other Ambulatory Visit: Payer: Medicare Other

## 2014-07-24 ENCOUNTER — Other Ambulatory Visit (INDEPENDENT_AMBULATORY_CARE_PROVIDER_SITE_OTHER): Payer: Medicare Other

## 2014-07-24 DIAGNOSIS — R0602 Shortness of breath: Secondary | ICD-10-CM

## 2014-07-24 DIAGNOSIS — I499 Cardiac arrhythmia, unspecified: Secondary | ICD-10-CM

## 2014-07-24 DIAGNOSIS — R0789 Other chest pain: Secondary | ICD-10-CM

## 2014-08-15 ENCOUNTER — Encounter: Payer: Self-pay | Admitting: Cardiovascular Disease

## 2014-09-26 ENCOUNTER — Encounter: Payer: Self-pay | Admitting: Podiatry

## 2014-09-26 ENCOUNTER — Ambulatory Visit (INDEPENDENT_AMBULATORY_CARE_PROVIDER_SITE_OTHER): Payer: Medicare Other

## 2014-09-26 ENCOUNTER — Ambulatory Visit (INDEPENDENT_AMBULATORY_CARE_PROVIDER_SITE_OTHER): Payer: Medicare Other | Admitting: Podiatry

## 2014-09-26 VITALS — BP 128/75 | HR 66 | Resp 16

## 2014-09-26 DIAGNOSIS — M205X1 Other deformities of toe(s) (acquired), right foot: Secondary | ICD-10-CM

## 2014-09-26 DIAGNOSIS — R52 Pain, unspecified: Secondary | ICD-10-CM

## 2014-09-26 DIAGNOSIS — B351 Tinea unguium: Secondary | ICD-10-CM

## 2014-09-26 DIAGNOSIS — M7750 Other enthesopathy of unspecified foot: Secondary | ICD-10-CM

## 2014-09-26 DIAGNOSIS — M6588 Other synovitis and tenosynovitis, other site: Secondary | ICD-10-CM

## 2014-09-26 NOTE — Progress Notes (Signed)
She presents today with a 3 week duration of pain to the fifth metatarsal base right foot. She denies any trauma any past medical history medications are allergy changes. She states that the hallux nail plate right demonstrates onychomycosis.  Objective: Vital signs are stable she is alert and oriented 3 pulses are palpable right no erythema edema cellulitis drainage or odor just pain on palpation fifth metatarsal base of the peroneal brevis insertion site right. She also has a darkening to the nail plate which on a previous fungal culture demonstrated a negative status.  Assessment: Insertional peroneal brevis tendinitis and negative onychomycosis nail dystrophy and possible bacterial infection are present.  Plan: Injected today with dexamethasone and local anesthetic to the point of maximal tenderness writing Kercher wear her Darco shoe which she has at home encouraged rest and ice compression and elevation of this right ankle.

## 2014-10-10 ENCOUNTER — Ambulatory Visit (INDEPENDENT_AMBULATORY_CARE_PROVIDER_SITE_OTHER): Payer: Medicare Other | Admitting: Podiatry

## 2014-10-10 ENCOUNTER — Ambulatory Visit: Payer: Medicare Other | Admitting: Podiatry

## 2014-10-10 VITALS — BP 116/68 | HR 88 | Resp 16

## 2014-10-10 DIAGNOSIS — M778 Other enthesopathies, not elsewhere classified: Secondary | ICD-10-CM

## 2014-10-10 DIAGNOSIS — G5761 Lesion of plantar nerve, right lower limb: Secondary | ICD-10-CM

## 2014-10-10 DIAGNOSIS — M7751 Other enthesopathy of right foot: Secondary | ICD-10-CM

## 2014-10-10 DIAGNOSIS — M779 Enthesopathy, unspecified: Principal | ICD-10-CM

## 2014-10-11 NOTE — Progress Notes (Signed)
She presents today for follow-up of pain to the lateral aspect of her fifth metatarsal base right foot. She states this seems to be a little better but I hurt more and here now and she points to the sinus tarsi of the right foot. She is also complaining of pain to the forefoot.   Objective:Pulses remain palpable right foot. Neurologic sensorium is intact though she does have a palpable Mulder's click to the third interdigital space of the right foot. She also has pain on end range of motionof the subtalar joint right foot.she no longer has pain on palpation of the fifth metatarsal base of the right foot.  Assessment: Subtalar joint capsulitis right foot. Neuroma third interdigital space right foot.  Plan: Reinjected the neuroma third interdigital space right foot with Kenalog and local anesthetic after sterile Betadine skin prep. Injected the subtalar joint today after sterile Betadine skin prep with Kenalog and local anesthetic.

## 2014-10-31 ENCOUNTER — Encounter: Payer: Self-pay | Admitting: Podiatry

## 2014-11-07 ENCOUNTER — Ambulatory Visit: Payer: Medicare Other | Admitting: Podiatry

## 2014-12-28 DIAGNOSIS — E538 Deficiency of other specified B group vitamins: Secondary | ICD-10-CM | POA: Insufficient documentation

## 2014-12-28 DIAGNOSIS — M4134 Thoracogenic scoliosis, thoracic region: Secondary | ICD-10-CM | POA: Insufficient documentation

## 2014-12-28 DIAGNOSIS — G500 Trigeminal neuralgia: Secondary | ICD-10-CM | POA: Insufficient documentation

## 2015-01-01 NOTE — Op Note (Signed)
PATIENT NAME:  Jocelyn Hooper, Jocelyn Hooper MR#:  [id number] DATE OF BIRTH:  [birth year]  DATE OF PROCEDURE:  09/12/2012  PREOPERATIVE DIAGNOSIS: Degenerative arthrosis of the left knee.   POSTOPERATIVE DIAGNOSIS: Degenerative arthrosis of the left knee.   PROCEDURE PERFORMED: Left total knee arthroplasty using computer-assisted navigation.   SURGEON: Laurice Record. Hooten, MD  ASSISTANT: Vance Peper, PA-C (required to maintain retraction throughout the procedure)   ANESTHESIA: Femoral nerve block and general.   ESTIMATED BLOOD LOSS: 50 mL.   FLUIDS REPLACED: 2000 mL crystalloid.   TOURNIQUET TIME: 81 minutes.   DRAINS: Two medium drains to reinfusion system.   SOFT TISSUE RELEASES: Anterior cruciate ligament, posterior cruciate ligament, deep medial collateral ligament, patellofemoral ligament and posterolateral corner.   IMPLANTS UTILIZED:  DePuy PFC Sigma size 3 posterior stabilized femoral component (cemented), size 3 MBT tibial component (cemented), 35 mm three peg oval dome patella (cemented) and a 10 mm stabilized rotating platform polyethylene insert.  INDICATIONS FOR SURGERY: The patient is a 73 year old female who has been seen for complaints of persistent and progressive left knee pain. She developed valgus deformity and radiographs demonstrated significant degenerative changes in tricompartmental fashion. After discussion of the risks and benefits of surgical intervention, the patient expressed her understanding of the risks and benefits and agreed with plans for surgical intervention.   PROCEDURE IN DETAIL: The patient was brought to the operating room and, after adequate femoral nerve block and general anesthesia was achieved (spinal anesthesia could not be adequately achieved and it was elected to proceed with general anesthesia), a tourniquet was placed on the patient's upper left thigh. The patient's left knee and leg were cleaned and prepped with alcohol and DuraPrep and draped in the usual  sterile fashion. A "timeout" was performed as per usual protocol. The left lower extremity was exsanguinated using an Esmarch, and the tourniquet was inflated to 300 mmHg. An anterior longitudinal incision was made followed by a standard mid vastus approach. A moderate effusion was evacuated. Deep fibers of the medial collateral ligament were elevated in subperiosteal fashion off the medial flare of the tibia so as to maintain a continuous soft tissue sleeve. The patella was subluxed laterally and the patellofemoral ligament was incised. Inspection of the knee demonstrated severe degenerative changes with full thickness loss of articular cartilage to the lateral tibial plateau and lateral femoral condyle. Significant degenerative changes were also noted to the medial and patellofemoral compartments. Osteophytes were debrided using a rongeur. Anterior and posterior cruciate ligaments were excised. Two 4.0 mm Schanz pins were inserted into the femur and into the tibia for attachment of the ray of trackers used for computer-assisted navigation. Hip center was identified using a circumduction technique. Distal landmarks were mapped using the computer. The distal femur and proximal tibia were mapped using the computer. Distal femoral cutting guide was positioned using computer-assisted navigation and the distal cut was performed and verified using the computer. Distal femur was sized and it was felt that a size 3 femoral component was appropriate. Size 3 cutting guide was positioned and the anterior cut was performed and verified using the computer. This was followed by completion of the posterior and chamfer cuts. Femoral cutting guide for the central box was then positioned and central box cut was performed.   Attention was then directed to the proximal tibia. Medial and lateral menisci were excised. The extramedullary tibial cutting guide was positioned using computer-assisted navigation so as to achieve 0 degree  varus valgus alignment and 0 degree  posterior slope. Cut was performed and verified using the computer. The proximal tibia was sized and it was felt that a size 3 tibial tray was appropriate. Tibial and femoral trials were inserted followed by insertion of a 10 mm polyethylene trial. The knee was felt to be tight laterally. The trial components were removed and the knee was brought into full extension and distracted using Moreland retractors. The posterolateral corner was noted be extremely tight and was carefully released using a combination of electrocautery and Metzenbaum scissors. This allowed for good medial and lateral soft tissue balancing. Trial components were reinserted followed by insertion of a 10 mm polyethylene trial. Excellent mediolateral soft tissue balancing was appreciated both in extension and in flexion. Finally, the patella was cut and prepared so as to accommodate a 35 mm three peg oval dome patella. Patellar trial was placed and the knee was placed through a range of motion with excellent patellar tracking appreciated.   Femoral trial was removed after debridement of posterior osteophytes. Central post hole for the tibial component was reamed followed by insertion of a keel punch. Tibial trial was then removed. Cut surfaces of bone were irrigated with copious amounts of normal saline with antibiotic solution using pulsatile lavage and then suctioned dry. Polymethyl methacrylate cement with gentamicin was prepared in the usual fashion using a vacuum mixer. Cement was applied to the cut surface of the proximal tibia as well as along the undersurface of a size 3 MBT tibial component. The tibial component was positioned and impacted into place. Excess cement was removed using freer elevators. Cement was then applied to the cut surface of the femur as well as along the posterior flanges of a size 3 posterior stabilized femoral component. Femoral component was positioned and impacted into place.  Excess cement was removed using freer elevators. A 10 mm polyethylene trial was inserted and the knee was brought into full extension with steady axial compression applied. Finally, cement was applied to the backside of a 35 mm three peg oval dome patella and the patellar component was positioned and patellar clamp applied. Excess cement was removed using freer elevators.   After adequate curing of cement, the tourniquet was deflated after total tourniquet time of 81 minutes. Hemostasis was achieved using electrocautery. The knee was irrigated with copious amounts of normal saline with antibiotic solution using pulsatile lavage and then suctioned dry. The knee was inspected for any residual cement debris. A total of 30 mL of 0.25% Marcaine with epinephrine was injected along the posterior capsule. A 10 mm stabilized rotating platform polyethylene insert was inserted and the knee was placed through a range of motion. Excellent patellar tracking was appreciated and excellent mediolateral soft tissue balancing was appreciated both in extension and in flexion. Two medium drains were placed in the wound bed and brought out through a separate stab incision to be attached to a reinfusion system. The medial parapatellar portion of the incision was reapproximated using interrupted sutures of #1 Vicryl. The subcutaneous tissue was approximated in layers using first #0 Vicryl followed #2-0 Vicryl. Skin was closed with skin staples. A sterile dressing was applied.   The patient tolerated the procedure well. She was transported to the recovery room in stable condition.  ____________________________ Laurice Record. Holley Bouche., MD [email or web address]:sb D: 09/12/2012 11:11:58 ET     T: 09/12/2012 11:29:31 ET       JOB#: [id number] cc: Jeneen Rinks P. Holley Bouche., MD, <Dictator> Laurice Record Holley Bouche MD ELECTRONICALLY SIGNED 09/12/2012 17:58

## 2015-01-04 NOTE — Discharge Summary (Signed)
PATIENT NAME:  Jocelyn Hooper, Jocelyn Hooper MR#:  [id number] DATE OF BIRTH:  02/18/1941  DATE OF ADMISSION:  09/12/2012 DATE OF DISCHARGE:  09/15/2012  ADMITTING DIAGNOSIS: Degenerative arthrosis of the left knee.   DISCHARGE DIAGNOSIS: Degenerative arthrosis of the right knee.   HISTORY: The patient is a 74 year old, who has been followed at [facility] for progression of left knee pain for several years. She has had numerous Synvisc injections initially, she responded well to these. However, recently the Synvisc injections along with the ibuprofen have not given her any relief. She was not using any ambulatory aid at the time of surgery. She complained mostly of the pain along the medial aspect of the knee. She was having problems with activities of daily living. She continued to be extremely active. X-rays taken in [place] showed valgus deformity with significant degenerative changes in a tricompartmental fashion. After discussion of the risks and benefits of surgical intervention, the patient expressed her understanding of the risks and benefits and agreed for plans for surgical intervention.   HOSPITAL COURSE:  PROCEDURE: left total knee arthroplasty using computer-assisted navigation.   ANESTHESIA: Femoral nerve block with general.   SOFT TISSUE RELEASE: Anterior cruciate ligament, posterior cruciate ligament, deep medial collateral ligaments, as well as the patellofemoral ligament and the posterior lateral corner.   IMPLANTS UTILIZED: DePuy PFC Sigma size 3 posterior stabilized femoral component (cemented), size 3 MBT tibial component (cemented), 35 mm 3-pegged oval dome patella (cemented) and a 10 mm stabilized rotating platform polyethylene insert.  At the time of the insertion of the catheter prior to her operation, she was noted to have significant cloudiness and sediment in her urine. A culture was obtained and a 36 hours post specimen did not reveal any growth. She was  asymptomatic. No antibiotics were given for this. It appears that she a little bit of this during preadmission as well.   The patient tolerated the procedure very well. She had no complications. She was then taken to the PACU where she was stabilized and then transferred to the orthopedic floor. She began receiving anticoagulation therapy of Lovenox 30 mg subcutaneous q. 12 hours per anesthesia protocol. She was fitted with TED stockings bilaterally. These were allowed to be removed 1 hour per 8 hour shift. The left one was applied on day 2 following removal of the Hemovac and dressing changes. The patient was also fitted with AVI compression foot pumps bilaterally set at 80 mmHg. Her calves have been nontender. There has been no evidence of any deep venous thromboses of the lower extremity. Negative Homans sign. Heels were elevated off the bed using rolled towels.   The patient has denied any chest pain or shortness of breath. Vital signs have been stable. She has been afebrile. Hemodynamically she was stable. No transfusions were given other than the Autovac transfusion given the first 6 hours postoperatively.   Physical therapy was initiated on day 1 for gait training and transfers. Upon being discharged, she was ambulating greater than 200 feet. She was able go up and down 4 sets of steps. She was independent with bed to chair transfers. Occupational therapy was also initiated on day 1 for ADLs and assistive devices. She has progressed very nicely. There have been no complications throughout the hospital course.   The patient's IV, Foley and Hemovac were all discontinued on day 2 along with dressing change. The Polar Care was reapplied to the surgical leg maintaining a temperature of 40 to 50 degrees  Fahrenheit.   DISPOSITION: The patient is discharged to home in improved stable condition.   DISCHARGE INSTRUCTIONS: She may weight bear as tolerated. Continue using a walker until cleared by physical  therapy to go to a quad cane. She will receive home health PT. Continue TED stockings. These are to be worn during the day, but may be removed at night.  Recommend that she continue with Polar Care using her Polar Care around-the-clock as much as possible maintaining a temperature of 40 to 50 degrees Fahrenheit. She is placed on a regular diet. She has a follow-up appointment on January 14. She is to call the clinic sooner if any temperatures of 101.5 or greater or excessive bleeding.   DISCHARGE MEDICATIONS:  She was given a prescription for oxycodone 5 to 10 mg  q. 4 to 6 hours p.r.n. for pain, tramadol 50 to 100 mg q. 4 to 6 hours p.r.n. for pain and Lovenox 40 mg subcutaneously daily for 14 days, then discontinue and begin taking one 81 mg enteric-coated aspirin. She is to resume her regular medications that she was on prior to admission.   PAST MEDICAL HISTORY: Allergic rhinitis, asthma, cervical degenerative disk disease, diverticulosis, fibrocystic breast disease, gastroesophageal reflux disease, history of nodular tendinitis, history of recurrent sinusitis, hyperlipidemia, irritable bowel syndrome, nasal polyps and postmenopausal scoliosis.   ____________________________ Vance Peper, PA [id number] D: 09/15/2012 08:06:25 ET T: 09/15/2012 08:32:39 ET JOB#: [id number]  cc: Vance Peper, PA, <Dictator> Sharilyn Geisinger PA ELECTRONICALLY SIGNED 09/15/2012 20:12

## 2015-01-05 NOTE — Op Note (Signed)
PATIENT NAME:  Jocelyn Hooper, Jocelyn Hooper MR#:  [id number] DATE OF BIRTH:  11/04/1940  DATE OF PROCEDURE:  02/01/2014  PREOPERATIVE DIAGNOSIS: Left tonsil lesion (possible neoplasm versus infection).     POSTOPERATIVE DIAGNOSIS: Left tonsil lesion (possible neoplasm versus infection).   SURGEON: Janalee Dane, M.D.   DESCRIPTION OF PROCEDURE: The patient was placed in the supine position on the operating room table. After general endotracheal anesthesia had been induced, the patient was turned 90 degrees clockwise from anesthesia and placed in a slight Trendelenburg position. The Dingman mouth retractor was placed and suspended gently from the [place] stand and the tonsils were palpated. The left tonsil was slightly firmer than the right. The right side was completely smooth no abnormality visible therefore, it was left alone. The left tonsil with a large crypt in the center was grasped with a tonsil tenaculum and retracted to the midline. The Bovie cautery set on 25 was used to dissect the tonsil from its peritonsillar space. The tonsil was placed in saline and was taken by me down to the pathologist for description and verification of my clinical concerns. Prior to taking the specimen down, the tonsil base was injected with 0.5% plain bupivacaine. The oropharynx and nasopharynx was copiously irrigated with saline. The patient was returned to anesthesia, allowed to emerge from anesthesia in the operating room and taken to the recovery room in stable condition. There were no complications. Estimated blood loss less than 5 mL.   ____________________________ J. Nadeen Landau, MD [email or web address] D: 02/01/2014 07:59:03 ET T: 02/01/2014 08:04:36 ET JOB#: [id number]  cc: Janalee Dane, MD, <Dictator> Nicholos Johns MD ELECTRONICALLY SIGNED 02/11/2014 17:32

## 2015-01-09 ENCOUNTER — Ambulatory Visit
Admit: 2015-01-09 | Disposition: A | Discharge status: Home / Self Care | Payer: Self-pay | Attending: General Practice | Admitting: General Practice

## 2015-03-13 ENCOUNTER — Ambulatory Visit (INDEPENDENT_AMBULATORY_CARE_PROVIDER_SITE_OTHER): Payer: Medicare Other | Admitting: Podiatry

## 2015-03-13 ENCOUNTER — Encounter: Payer: Self-pay | Admitting: Podiatry

## 2015-03-13 VITALS — BP 93/60 | HR 87 | Resp 16

## 2015-03-13 DIAGNOSIS — G5762 Lesion of plantar nerve, left lower limb: Secondary | ICD-10-CM | POA: Diagnosis not present

## 2015-03-13 DIAGNOSIS — G5782 Other specified mononeuropathies of left lower limb: Secondary | ICD-10-CM

## 2015-03-14 NOTE — Progress Notes (Signed)
She presents today and states in the left foot is starting to her.  Objective: Vital signs are stable she is alert and oriented 3. Pulses are strongly palpable. Neurologic sensorium is intact she does have a palpable Mulder's click to the third digitthe left foot.  Assessment: Neuroma third interdigital space left foot. Mild tenderness to the fourth intermetatarsal space left foot.  Plan: Injected her first dose of dehydrated alcohol third interdigital space left foot. Follow up with her in 3-4 weeks.

## 2015-04-10 ENCOUNTER — Ambulatory Visit: Payer: Medicare Other | Admitting: Podiatry

## 2015-04-15 ENCOUNTER — Ambulatory Visit (INDEPENDENT_AMBULATORY_CARE_PROVIDER_SITE_OTHER): Payer: Medicare Other | Admitting: Podiatry

## 2015-04-15 ENCOUNTER — Encounter: Payer: Self-pay | Admitting: Podiatry

## 2015-04-15 ENCOUNTER — Ambulatory Visit (INDEPENDENT_AMBULATORY_CARE_PROVIDER_SITE_OTHER): Payer: Medicare Other

## 2015-04-15 VITALS — BP 133/73 | HR 77 | Resp 16

## 2015-04-15 DIAGNOSIS — S92302A Fracture of unspecified metatarsal bone(s), left foot, initial encounter for closed fracture: Secondary | ICD-10-CM

## 2015-04-15 MED ORDER — ONDANSETRON HCL 4 MG PO TABS: 4.0000 mg | ORAL_TABLET | Freq: Three times a day (TID) | ORAL | Status: DC | PRN

## 2015-04-15 MED ORDER — ONDANSETRON 4 MG PO TBDP: 4.0000 mg | ORAL_TABLET | Freq: Three times a day (TID) | ORAL | Status: DC | PRN

## 2015-04-15 NOTE — Progress Notes (Signed)
Presents today after having twisted her left foot. She states is in a little more than a week now and it has been fairly sore and swollen. She relates a fall last Sunday the resulted in pain to the left foot and abrasions to her right knee and hip. She denies any past changes in her past medical history medications or allergies.  Objective: Vital signs are stable she is alert and oriented 3. Pulses are palpable. Moderate edema to the left foot overlying the fifth metatarsal base which is exquisitely painful. Radiographic evaluation demonstrates fracture fifth metatarsal base approximately 6 mm of diastases.  Assessment: Fracture fifth metatarsal base left. Neuroma.  Plan: Place her in a Cam Walker for the next month left foot. Consider injection for neuroma next visit.

## 2015-04-19 ENCOUNTER — Telehealth: Payer: Self-pay | Admitting: Podiatry

## 2015-04-19 NOTE — Telephone Encounter (Signed)
Pt called and  She has a  question about the boot that she was given and another question also.

## 2015-04-22 ENCOUNTER — Telehealth: Payer: Self-pay | Admitting: Podiatry

## 2015-04-22 NOTE — Telephone Encounter (Signed)
Patient was called-see previous note.

## 2015-04-22 NOTE — Telephone Encounter (Signed)
Called patient - she wanted to know if she needed to sleep in the boot. Told her she does not have to sleep in the boot, just make sure that anytime she is weight bearing that she has it on. Patient understood.

## 2015-04-22 NOTE — Telephone Encounter (Signed)
-----   Message from [facility] sent at 04/22/2015 10:15 AM EDT ----- Regarding: pt states she has called 3 times now Contact: [phone] Pt called and said she has called 2 times last week and again today requarding some questions about the boot she was given. Please call her back asap.Marland KitchenHer other # is [phone].Marland Kitchen

## 2015-04-22 NOTE — Telephone Encounter (Signed)
Pt states she has called n

## 2015-05-13 ENCOUNTER — Ambulatory Visit (INDEPENDENT_AMBULATORY_CARE_PROVIDER_SITE_OTHER): Payer: Medicare Other

## 2015-05-13 ENCOUNTER — Ambulatory Visit (INDEPENDENT_AMBULATORY_CARE_PROVIDER_SITE_OTHER): Payer: Medicare Other | Admitting: Podiatry

## 2015-05-13 DIAGNOSIS — S92302D Fracture of unspecified metatarsal bone(s), left foot, subsequent encounter for fracture with routine healing: Secondary | ICD-10-CM | POA: Diagnosis not present

## 2015-05-13 NOTE — Progress Notes (Signed)
She presents today for follow-up of her fracture fifth metatarsal base of her left foot. She states that is feeling better his lungs are wear the boot. She states that she has not been out of the boot with exception of the shower the entire month. When I ask her about her right foot she states that he hasn't bothered her at all.  Objective: Vital signs are stable she is alert and oriented 3 no acute distress well-nourished. Cam Walker intact to the left lower extremity was removed demonstrates moderate edema left lower extremity pain on palpation fifth metatarsal base left. Pulses remain strongly palpable. Radiographs 3 views taken TODAY still demonstrate large diastases at the base of the fifth metatarsal left foot.  Assessment: Fracture fifth metatarsal base left foot.  Plan: I encouraged her to continue the use of the Cam Walker and I will follow-up with her in 4-6 weeks.  Roselind Messier DPM

## 2015-06-05 ENCOUNTER — Other Ambulatory Visit: Payer: Self-pay | Admitting: Physician Assistant

## 2015-06-05 DIAGNOSIS — M2391 Unspecified internal derangement of right knee: Secondary | ICD-10-CM

## 2015-06-10 ENCOUNTER — Ambulatory Visit (INDEPENDENT_AMBULATORY_CARE_PROVIDER_SITE_OTHER): Payer: Medicare Other | Admitting: Podiatry

## 2015-06-10 ENCOUNTER — Encounter: Payer: Self-pay | Admitting: Podiatry

## 2015-06-10 ENCOUNTER — Ambulatory Visit (INDEPENDENT_AMBULATORY_CARE_PROVIDER_SITE_OTHER): Payer: Medicare Other

## 2015-06-10 VITALS — BP 120/80 | HR 64 | Resp 12

## 2015-06-10 DIAGNOSIS — S92302D Fracture of unspecified metatarsal bone(s), left foot, subsequent encounter for fracture with routine healing: Secondary | ICD-10-CM

## 2015-06-10 NOTE — Progress Notes (Signed)
She presents today for follow-up of her fifth metatarsal base avulsion fracture left area she's been in a Cam Walker now for approximately 12 weeks she states that still experience some burning but the pain is subsiding. She will snowshoe be able to go to a wedding and where pretty shoes this weekend.  Objective: Vital signs are stable alert and oriented 3. Pulses are palpable left. Minimal pain on palpation fifth metatarsal base left. No erythema no ecchymosis.  Assessment: Well-healing fracture fifth that base left.  Plan: I encouraged her to wear the Cam Walker for approximately another month explaining to her that she may wear dress shoes over the weekend as long she will immediately return to her Cam Walker or stable tennis shoes. I will follow up with her in another month for another set of x-rays.  Roselind Messier DPM

## 2015-06-12 ENCOUNTER — Ambulatory Visit
Admission: RE | Admit: 2015-06-12 | Discharge: 2015-06-12 | Disposition: A | Discharge status: Home / Self Care | Payer: Medicare Other | Source: Ambulatory Visit | Attending: Physician Assistant | Admitting: Physician Assistant

## 2015-06-12 DIAGNOSIS — M179 Osteoarthritis of knee, unspecified: Secondary | ICD-10-CM | POA: Insufficient documentation

## 2015-06-12 DIAGNOSIS — X58XXXA Exposure to other specified factors, initial encounter: Secondary | ICD-10-CM | POA: Diagnosis not present

## 2015-06-12 DIAGNOSIS — M2391 Unspecified internal derangement of right knee: Secondary | ICD-10-CM | POA: Diagnosis not present

## 2015-06-12 DIAGNOSIS — M7121 Synovial cyst of popliteal space [Baker], right knee: Secondary | ICD-10-CM | POA: Insufficient documentation

## 2015-06-12 DIAGNOSIS — M25461 Effusion, right knee: Secondary | ICD-10-CM | POA: Diagnosis not present

## 2015-06-12 DIAGNOSIS — S83281A Other tear of lateral meniscus, current injury, right knee, initial encounter: Secondary | ICD-10-CM | POA: Insufficient documentation

## 2015-07-03 ENCOUNTER — Ambulatory Visit (INDEPENDENT_AMBULATORY_CARE_PROVIDER_SITE_OTHER): Payer: Medicare Other

## 2015-07-03 ENCOUNTER — Ambulatory Visit (INDEPENDENT_AMBULATORY_CARE_PROVIDER_SITE_OTHER): Payer: Medicare Other | Admitting: Podiatry

## 2015-07-03 ENCOUNTER — Encounter: Payer: Self-pay | Admitting: Podiatry

## 2015-07-03 VITALS — BP 107/62 | HR 83 | Resp 18

## 2015-07-03 DIAGNOSIS — G5762 Lesion of plantar nerve, left lower limb: Secondary | ICD-10-CM

## 2015-07-03 DIAGNOSIS — R52 Pain, unspecified: Secondary | ICD-10-CM

## 2015-07-03 DIAGNOSIS — G588 Other specified mononeuropathies: Secondary | ICD-10-CM

## 2015-07-03 DIAGNOSIS — G5761 Lesion of plantar nerve, right lower limb: Secondary | ICD-10-CM

## 2015-07-03 NOTE — Progress Notes (Signed)
She presents today for follow-up of her fracture fifth metatarsal base of the left foot. She states that it feels just great with exception of some tingling to her fifth toe left foot. Other than that she has noticed considerable changes about the neuroma to June 3 all lateral. She states the right foot is starting to hurt now which was not hurting before.  Objective: Vital signs are stable alert and oriented 3. Pulses are strongly palpable. She has no pain on palpation fifth metatarsal base of the left foot. She does have pain on palpation of the third interdigital space of the left foot with a palpable Mulder's click.  Assessment: Well-healing fifth met base fracture left. Neuroma third interdigital space left and a new neuroma third interdigital space right.  Plan: Discussed etiology pathology conservative versus surgical therapies. Get her back into a regular pair of tennis shoes for her left foot. Also injected the third interdigital space today with Kenalog and local anesthetic. We'll follow up with her in 3 weeks and consider restarting dehydrated alcohol.

## 2015-07-15 ENCOUNTER — Ambulatory Visit: Payer: Medicare Other | Admitting: Podiatry

## 2015-07-24 ENCOUNTER — Encounter: Payer: Self-pay | Admitting: Podiatry

## 2015-07-24 ENCOUNTER — Ambulatory Visit (INDEPENDENT_AMBULATORY_CARE_PROVIDER_SITE_OTHER): Payer: Medicare Other | Admitting: Podiatry

## 2015-07-24 ENCOUNTER — Ambulatory Visit (INDEPENDENT_AMBULATORY_CARE_PROVIDER_SITE_OTHER): Payer: Medicare Other

## 2015-07-24 VITALS — BP 144/86 | HR 72 | Resp 18

## 2015-07-24 DIAGNOSIS — R52 Pain, unspecified: Secondary | ICD-10-CM

## 2015-07-24 DIAGNOSIS — G5762 Lesion of plantar nerve, left lower limb: Secondary | ICD-10-CM | POA: Diagnosis not present

## 2015-07-24 DIAGNOSIS — G5761 Lesion of plantar nerve, right lower limb: Secondary | ICD-10-CM | POA: Diagnosis not present

## 2015-07-24 DIAGNOSIS — G588 Other specified mononeuropathies: Secondary | ICD-10-CM

## 2015-07-25 NOTE — Progress Notes (Signed)
She presents today for follow-up of her fracture fifth metatarsal base of the left foot stating that it feels much better. He states it is a little painful on occasion but all in all it is much better than it was previously. At this point she states that her neuromas are bothering her more.  Objective: Vital signs are stable she is alert and oriented 3. Pulses are strongly palpable. She has minimal tenderness on palpation of the fifth metatarsal. Radiographs of that today demonstrate the avulsion fracture fifth met base appears to be healing but is very slow to do so. Moderately displaced but appears to be healing and consolidating. She has palpable Biagio Borg signs to the bilateral third interdigital spaces.  Assessment well-healing fracture fifth metatarsal base left. Neuromas third interdigital space bilateral chronic in nature.  Plan: Discussed etiology pathology conservative versus surgical therapies. At this point she requested all injections and we provided those bilaterally. I will follow-up with her in 1 month.

## 2015-08-14 ENCOUNTER — Ambulatory Visit: Payer: Medicare Other | Admitting: Podiatry

## 2015-09-02 ENCOUNTER — Encounter: Payer: Self-pay | Admitting: *Deleted

## 2015-09-02 ENCOUNTER — Other Ambulatory Visit: Payer: Medicare Other

## 2015-09-02 NOTE — Patient Instructions (Signed)
  Your procedure is scheduled on: 09-13-15 (FRIDAY) Report to [place]. To find out your arrival time please call [phone] between 1PM - 3PM on 09-12-15 [facility])  Remember: Instructions that are not followed completely may result in serious medical risk, up to and including death, or upon the discretion of your surgeon and anesthesiologist your surgery may need to be rescheduled.    _X___ 1. Do not eat food or drink liquids after midnight. No gum chewing or hard candies.     _X___ 2. No Alcohol for 24 hours before or after surgery.   ____ 3. Bring all medications with you on the day of surgery if instructed.    _X___ 4. Notify your doctor if there is any change in your medical condition     (cold, fever, infections).     Do not wear jewelry, make-up, hairpins, clips or nail polish.  Do not wear lotions, powders, or perfumes. You may wear deodorant.  Do not shave 48 hours prior to surgery. Men may shave face and neck.  Do not bring valuables to the hospital.    [facility] is not responsible for any belongings or valuables.               Contacts, dentures or bridgework may not be worn into surgery.  Leave your suitcase in the car. After surgery it may be brought to your room.  For patients admitted to the hospital, discharge time is determined by your treatment team.   Patients discharged the day of surgery will not be allowed to drive home.   Please read over the following fact sheets that you were given:     _X___ Take these medicines the morning of surgery with A SIP OF WATER:    1. PREVACID  2.   3.   4.  5.  6.  ____ Fleet Enema (as directed)   _X___ Use CHG Soap as directed  ____ Use inhalers on the day of surgery  ____ Stop metformin 2 days prior to surgery    ____ Take 1/2 of usual insulin dose the night before surgery and none on the morning of surgery.   ____ Stop Coumadin/Plavix/aspirin-STOP ASPIRIN 7 DAYS PRIOR TO  SURGERY  _X___ Stop Anti-inflammatories-STOP MELOXICAM 7 DAYS PRIORS TO SURGERY   _X___ Stop supplements until after surgery-STOP VITAMIN C AND MELATONIN 7 DAYS PRIOR TO SURGERY  ____ Bring C-Pap to the hospital.

## 2015-09-03 ENCOUNTER — Other Ambulatory Visit: Payer: Medicare Other

## 2015-09-04 ENCOUNTER — Encounter
Admission: RE | Admit: 2015-09-04 | Discharge: 2015-09-04 | Disposition: A | Discharge status: Home / Self Care | Payer: Medicare Other | Source: Ambulatory Visit | Attending: Orthopedic Surgery | Admitting: Orthopedic Surgery

## 2015-09-04 DIAGNOSIS — Z01810 Encounter for preprocedural cardiovascular examination: Secondary | ICD-10-CM | POA: Insufficient documentation

## 2015-09-09 DIAGNOSIS — Z96652 Presence of left artificial knee joint: Secondary | ICD-10-CM | POA: Insufficient documentation

## 2015-09-13 ENCOUNTER — Ambulatory Visit: Payer: Medicare Other | Admitting: Certified Registered"

## 2015-09-13 ENCOUNTER — Ambulatory Visit
Admission: RE | Admit: 2015-09-13 | Discharge: 2015-09-13 | Disposition: A | Discharge status: Home / Self Care | Payer: Medicare Other | Source: Ambulatory Visit | Attending: Orthopedic Surgery | Admitting: Orthopedic Surgery

## 2015-09-13 ENCOUNTER — Encounter: Payer: Self-pay | Admitting: *Deleted

## 2015-09-13 ENCOUNTER — Encounter
Admission: RE | Disposition: A | Discharge status: Home / Self Care | Payer: Self-pay | Source: Ambulatory Visit | Attending: Orthopedic Surgery

## 2015-09-13 DIAGNOSIS — R51 Headache: Secondary | ICD-10-CM | POA: Diagnosis not present

## 2015-09-13 DIAGNOSIS — M23241 Derangement of anterior horn of lateral meniscus due to old tear or injury, right knee: Secondary | ICD-10-CM | POA: Insufficient documentation

## 2015-09-13 DIAGNOSIS — R0602 Shortness of breath: Secondary | ICD-10-CM | POA: Diagnosis not present

## 2015-09-13 DIAGNOSIS — M23251 Derangement of posterior horn of lateral meniscus due to old tear or injury, right knee: Secondary | ICD-10-CM | POA: Insufficient documentation

## 2015-09-13 DIAGNOSIS — M2391 Unspecified internal derangement of right knee: Secondary | ICD-10-CM | POA: Diagnosis present

## 2015-09-13 DIAGNOSIS — K219 Gastro-esophageal reflux disease without esophagitis: Secondary | ICD-10-CM | POA: Diagnosis not present

## 2015-09-13 DIAGNOSIS — J45909 Unspecified asthma, uncomplicated: Secondary | ICD-10-CM | POA: Diagnosis not present

## 2015-09-13 DIAGNOSIS — M238X1 Other internal derangements of right knee: Secondary | ICD-10-CM | POA: Diagnosis present

## 2015-09-13 DIAGNOSIS — M94261 Chondromalacia, right knee: Secondary | ICD-10-CM | POA: Diagnosis not present

## 2015-09-13 HISTORY — DX: Cerebrospinal fluid leak: G96.0

## 2015-09-13 HISTORY — DX: Cerebrospinal fluid leak, unspecified: G96.00

## 2015-09-13 HISTORY — DX: Chronic kidney disease, unspecified: N18.9

## 2015-09-13 HISTORY — DX: Headache: R51

## 2015-09-13 HISTORY — DX: Adverse effect of unspecified anesthetic, initial encounter: T41.45XA

## 2015-09-13 HISTORY — PX: KNEE ARTHROSCOPY: SHX127

## 2015-09-13 HISTORY — DX: Other complications of anesthesia, initial encounter: T88.59XA

## 2015-09-13 HISTORY — DX: Headache, unspecified: R51.9

## 2015-09-13 SURGERY — ARTHROSCOPY, KNEE
Anesthesia: General | Site: Knee | Laterality: Right | Wound class: Clean

## 2015-09-13 MED ORDER — ONDANSETRON HCL 4 MG/2ML IJ SOLN: 4.0000 mg | Freq: Four times a day (QID) | INTRAMUSCULAR | Status: DC | PRN

## 2015-09-13 MED ORDER — GLYCOPYRROLATE 0.2 MG/ML IJ SOLN
INTRAMUSCULAR | Status: DC | PRN
  Administered 2015-09-13: 0.2 mg via INTRAVENOUS

## 2015-09-13 MED ORDER — LACTATED RINGERS IV SOLN
INTRAVENOUS | Status: DC
  Administered 2015-09-13: 07:00:00 via INTRAVENOUS

## 2015-09-13 MED ORDER — MORPHINE SULFATE (PF) 4 MG/ML IV SOLN
INTRAVENOUS | Status: AC
  Filled 2015-09-13: qty 1

## 2015-09-13 MED ORDER — LIDOCAINE HCL (CARDIAC) 20 MG/ML IV SOLN
INTRAVENOUS | Status: DC | PRN
  Administered 2015-09-13: 50 mg via INTRAVENOUS

## 2015-09-13 MED ORDER — FENTANYL CITRATE (PF) 100 MCG/2ML IJ SOLN
INTRAMUSCULAR | Status: AC
  Filled 2015-09-13: qty 2

## 2015-09-13 MED ORDER — FENTANYL CITRATE (PF) 100 MCG/2ML IJ SOLN
INTRAMUSCULAR | Status: DC | PRN
Start: 2015-09-13 — End: 2015-09-13
  Administered 2015-09-13 (×2): 25 ug via INTRAVENOUS

## 2015-09-13 MED ORDER — BUPIVACAINE-EPINEPHRINE (PF) 0.25% -1:200000 IJ SOLN
INTRAMUSCULAR | Status: AC
  Filled 2015-09-13: qty 30

## 2015-09-13 MED ORDER — KETAMINE HCL 10 MG/ML IJ SOLN
INTRAMUSCULAR | Status: DC | PRN
  Administered 2015-09-13: 25 mg via INTRAVENOUS

## 2015-09-13 MED ORDER — ACETAMINOPHEN 10 MG/ML IV SOLN
INTRAVENOUS | Status: AC
  Filled 2015-09-13: qty 100

## 2015-09-13 MED ORDER — MIDAZOLAM HCL 2 MG/2ML IJ SOLN
INTRAMUSCULAR | Status: DC | PRN
  Administered 2015-09-13: 2 mg via INTRAVENOUS

## 2015-09-13 MED ORDER — HYDROCODONE-ACETAMINOPHEN 5-325 MG PO TABS
ORAL_TABLET | ORAL | Status: AC
  Filled 2015-09-13: qty 1

## 2015-09-13 MED ORDER — HYDROCODONE-ACETAMINOPHEN 5-325 MG PO TABS: 1.0000 | ORAL_TABLET | ORAL | Status: DC | PRN

## 2015-09-13 MED ORDER — CEFAZOLIN SODIUM-DEXTROSE 2-3 GM-% IV SOLR
INTRAVENOUS | Status: AC
  Filled 2015-09-13: qty 50

## 2015-09-13 MED ORDER — ACETAMINOPHEN 10 MG/ML IV SOLN
INTRAVENOUS | Status: DC | PRN
  Administered 2015-09-13: 1000 mg via INTRAVENOUS

## 2015-09-13 MED ORDER — PHENYLEPHRINE HCL 10 MG/ML IJ SOLN
INTRAMUSCULAR | Status: DC | PRN
  Administered 2015-09-13 (×4): 100 ug via INTRAVENOUS

## 2015-09-13 MED ORDER — CHLORHEXIDINE GLUCONATE 4 % EX LIQD: Freq: Once | CUTANEOUS | Status: DC

## 2015-09-13 MED ORDER — METOCLOPRAMIDE HCL 5 MG/ML IJ SOLN: 5.0000 mg | Freq: Three times a day (TID) | INTRAMUSCULAR | Status: DC | PRN

## 2015-09-13 MED ORDER — HYDROCODONE-ACETAMINOPHEN 5-325 MG PO TABS
1.0000 | ORAL_TABLET | ORAL | Status: DC | PRN
  Administered 2015-09-13: 1 via ORAL

## 2015-09-13 MED ORDER — METOCLOPRAMIDE HCL 10 MG PO TABS: 5.0000 mg | ORAL_TABLET | Freq: Three times a day (TID) | ORAL | Status: DC | PRN

## 2015-09-13 MED ORDER — ONDANSETRON HCL 4 MG/2ML IJ SOLN
INTRAMUSCULAR | Status: DC | PRN
  Administered 2015-09-13: 4 mg via INTRAVENOUS

## 2015-09-13 MED ORDER — ONDANSETRON HCL 4 MG/2ML IJ SOLN
4.0000 mg | Freq: Once | INTRAMUSCULAR | Status: DC | PRN
Start: 2015-09-13 — End: 2015-09-13

## 2015-09-13 MED ORDER — ONDANSETRON HCL 4 MG PO TABS: 4.0000 mg | ORAL_TABLET | Freq: Four times a day (QID) | ORAL | Status: DC | PRN

## 2015-09-13 MED ORDER — MORPHINE SULFATE (PF) 4 MG/ML IV SOLN
INTRAVENOUS | Status: DC | PRN
  Administered 2015-09-13: 4 mg

## 2015-09-13 MED ORDER — PROPOFOL 10 MG/ML IV BOLUS
INTRAVENOUS | Status: DC | PRN
  Administered 2015-09-13: 130 mg via INTRAVENOUS

## 2015-09-13 MED ORDER — BUPIVACAINE-EPINEPHRINE 0.25% -1:200000 IJ SOLN
INTRAMUSCULAR | Status: DC | PRN
  Administered 2015-09-13: 5 mL

## 2015-09-13 MED ORDER — DEXAMETHASONE SODIUM PHOSPHATE 10 MG/ML IJ SOLN
INTRAMUSCULAR | Status: DC | PRN
  Administered 2015-09-13: 5 mg via INTRAVENOUS

## 2015-09-13 MED ORDER — FENTANYL CITRATE (PF) 100 MCG/2ML IJ SOLN
25.0000 ug | INTRAMUSCULAR | Status: DC | PRN
  Administered 2015-09-13 (×4): 25 ug via INTRAVENOUS

## 2015-09-13 MED ORDER — CEFAZOLIN SODIUM-DEXTROSE 2-3 GM-% IV SOLR
2.0000 g | Freq: Once | INTRAVENOUS | Status: AC
  Administered 2015-09-13: 2 g via INTRAVENOUS

## 2015-09-13 SURGICAL SUPPLY — 23 items
BLADE SHAVER 4.5 DBL SERAT CV (CUTTER) ×3 IMPLANT
BNDG ESMARK 6X12 TAN STRL LF (GAUZE/BANDAGES/DRESSINGS) ×3 IMPLANT
DRSG DERMACEA 8X12 NADH (GAUZE/BANDAGES/DRESSINGS) ×3 IMPLANT
DURAPREP 26ML APPLICATOR (WOUND CARE) ×6 IMPLANT
GAUZE SPONGE 4X4 12PLY STRL (GAUZE/BANDAGES/DRESSINGS) ×3 IMPLANT
GLOVE BIOGEL M STRL SZ7.5 (GLOVE) ×3 IMPLANT
GLOVE INDICATOR 8.0 STRL GRN (GLOVE) ×3 IMPLANT
GOWN STRL REUS W/ TWL LRG LVL3 (GOWN DISPOSABLE) ×1 IMPLANT
GOWN STRL REUS W/ TWL LRG LVL4 (GOWN DISPOSABLE) ×1 IMPLANT
GOWN STRL REUS W/TWL LRG LVL3 (GOWN DISPOSABLE) ×2
GOWN STRL REUS W/TWL LRG LVL4 (GOWN DISPOSABLE) ×2
IV LACTATED RINGER IRRG 3000ML (IV SOLUTION) ×12
IV LR IRRIG 3000ML ARTHROMATIC (IV SOLUTION) ×6 IMPLANT
MANIFOLD NEPTUNE II (INSTRUMENTS) ×3 IMPLANT
PACK ARTHROSCOPY KNEE (MISCELLANEOUS) ×3 IMPLANT
SET TUBE SUCT SHAVER OUTFL 24K (TUBING) ×3 IMPLANT
SET TUBE TIP INTRA-ARTICULAR (MISCELLANEOUS) ×3 IMPLANT
STRAP SAFETY BODY (MISCELLANEOUS) ×3 IMPLANT
SUT ETHILON 3-0 FS-10 30 BLK (SUTURE) ×3
SUTURE EHLN 3-0 FS-10 30 BLK (SUTURE) ×1 IMPLANT
TUBING ARTHRO INFLOW-ONLY STRL (TUBING) ×3 IMPLANT
WAND HAND CNTRL MULTIVAC 50 (MISCELLANEOUS) ×3 IMPLANT
WRAP KNEE W/COLD PACKS 25.5X14 (SOFTGOODS) ×3 IMPLANT

## 2015-09-13 NOTE — Anesthesia Procedure Notes (Signed)
Procedure Name: LMA Insertion Performed by: Kemon Devincenzi Pre-anesthesia Checklist: Patient identified, Patient being monitored, Timeout performed, Emergency Drugs available and Suction available Patient Re-evaluated:Patient Re-evaluated prior to inductionOxygen Delivery Method: Circle system utilized Preoxygenation: Pre-oxygenation with 100% oxygen Intubation Type: IV induction Ventilation: Mask ventilation without difficulty LMA: LMA inserted LMA Size: 4.0 Tube type: Oral Number of attempts: 1 Placement Confirmation: positive ETCO2 and breath sounds checked- equal and bilateral Tube secured with: Tape Dental Injury: Teeth and Oropharynx as per pre-operative assessment      

## 2015-09-13 NOTE — H&P (Signed)
The patient has been re-examined, and the chart reviewed, and there have been no interval changes to the documented history and physical.    The risks, benefits, and alternatives have been discussed at length. The patient expressed understanding of the risks benefits and agreed with plans for surgical intervention.  James P. Hooten, [name] M.D.    

## 2015-09-13 NOTE — Op Note (Signed)
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   ASSISTANT: none  ANESTHESIA: general  ESTIMATED BLOOD LOSS: Minimal  FLUIDS REPLACED: 700 mL of crystalloid  TOURNIQUET TIME: Not used   DRAINS: none  IMPLANTS UTILIZED: None  INDICATIONS FOR SURGERY: Jocelyn Hooper is a 74 y.o. year old female who has been seen for complaints of right knee pain. MRI demonstrated findings consistent with meniscal pathology. After discussion of the risks and benefits of surgical intervention, the patient expressed understanding of the risks benefits and agree with plans for right knee arthroscopy.   PROCEDURE IN DETAIL: The patient was brought into the operating room and, after adequate general anesthesia was achieved, a tourniquet was applied to the right thigh and the leg was placed in the leg holder. All bony prominences were well padded. The patient's right knee was cleaned and prepped with alcohol and Duraprep and draped in the usual sterile fashion. A "timeout" was performed as per usual protocol. The anticipated portal sites were injected with 0.25% Marcaine with epinephrine. An anterolateral incision was made and a cannula was inserted. A large effusion was evacuated and the knee was distended with fluid using the pump. The scope was advanced down the medial gutter into the medial compartment. Under visualization with the scope, an anteromedial portal was created and a hooked probe was inserted. The medial meniscus was visualized and probed. The medial meniscus was intact without  evidence of tear or instability. The articular cartilage was visualized. Only minimal chondral changes were appreciated to the lateral compartment.  The scope was then advanced into the intercondylar notch. The anterior cruciate ligament was visualized and probed and felt to be intact. The scope was removed from the lateral portal and reinserted via the anteromedial portal to better visualize the lateral compartment. The lateral meniscus was visualized and probed. There was a complex tear involving the anterior and posterior horns of the lateral meniscus. The tears were debrided using meniscal punches and a 4.5 mm incisor shaver. Final contouring was performed using the 50 ArthroCare wand. The remaining rim of meniscus was probed and felt to be stable. The articular cartilage of the lateral compartment was visualized. Grade 3-4 chondromalacia was noted to the lateral tibial plateau. These areas of fibrillation were debrided and contoured using the ArthroCare wand. Finally, the scope was advanced so as to visualize the patellofemoral articulation. Good patellar tracking was appreciated. The articular surface was in reasonably good condition.  The knee was irrigated with copius amounts of fluid and suctioned dry. The anterolateral portal was re-approximated with #3-0 nylon. A combination of 0.25% Marcaine with epinephrine and 4 mg of Morphine were injected via the scope. The scope was removed and the anteromedial portal was re-approximated with #3-0 nylon. A sterile dressing was applied followed by application of an ice wrap.  The patient tolerated the procedure well and was transported to the PACU in stable condition.  Jocelyn Hooper P. Holley Bouche., M.D.

## 2015-09-13 NOTE — Anesthesia Preprocedure Evaluation (Signed)
Anesthesia Evaluation  Patient identified by MRN, date of birth, ID band Patient awake    Reviewed: Allergy & Precautions, H&P , NPO status , Patient's Chart, lab work & pertinent test results, reviewed documented beta blocker date and time   History of Anesthesia Complications (+) PONV and history of anesthetic complications  Airway Mallampati: II  TM Distance: >3 FB Neck ROM: full    Dental  (+) Teeth Intact   Pulmonary neg pulmonary ROS, shortness of breath, asthma ,    Pulmonary exam normal        Cardiovascular Exercise Tolerance: Good negative cardio ROS Normal cardiovascular exam Rate:Normal     Neuro/Psych  Headaches, negative neurological ROS  negative psych ROS   GI/Hepatic negative GI ROS, Neg liver ROS, GERD  ,  Endo/Other  negative endocrine ROS  Renal/GU Renal diseasenegative Renal ROS  negative genitourinary   Musculoskeletal   Abdominal   Peds  Hematology negative hematology ROS (+)   Anesthesia Other Findings   Reproductive/Obstetrics negative OB ROS                             Anesthesia Physical Anesthesia Plan  ASA: III  Anesthesia Plan: General LMA   Post-op Pain Management:    Induction:   Airway Management Planned:   Additional Equipment:   Intra-op Plan:   Post-operative Plan:   Informed Consent: I have reviewed the patients History and Physical, chart, labs and discussed the procedure including the risks, benefits and alternatives for the proposed anesthesia with the patient or authorized representative who has indicated his/her understanding and acceptance.     Plan Discussed with: CRNA  Anesthesia Plan Comments:         Anesthesia Quick Evaluation

## 2015-09-13 NOTE — Discharge Instructions (Signed)
°  Instructions after Knee Arthroscopy  ° ° Quindell Shere P. Vineeth Fell, [name], M.D.    ° Dept. of Orthopaedics & Sports Medicine ° [facility] ° [address] ° [place], [place]  [address] ° ° Phone: [phone]   Fax: [phone] ° ° °DIET: °• Drink plenty of non-alcoholic fluids & begin a light diet. °• Resume your normal diet the day after surgery. ° °ACTIVITY:  °• You may use crutches or a walker with weight-bearing as tolerated, unless instructed otherwise. °• You may wean yourself off of the walker or crutches as tolerated.  °• Begin doing gentle exercises. Exercising will reduce the pain and swelling, increase motion, and prevent muscle weakness.   °• Avoid strenuous activities or athletics for a minimum of 4-6 weeks after arthroscopic surgery. °• Do not drive or operate any equipment until instructed. ° °WOUND CARE:  °• Place one to two pillows under the knee the first day or two when sitting or lying.  °• Continue to use the ice packs periodically to reduce pain and swelling. °• The small incisions in your knee are closed with nylon stitches. The stitches will be removed in the office. °• The bulky dressing may be removed on the second day after surgery. DO NOT TOUCH THE STITCHES. Put a Band-Aid over each stitch. Do NOT use any ointments or creams on the incisions.  °• You may bathe or shower after the stitches are removed at the first office visit following surgery. ° °MEDICATIONS: °• You may resume your regular medications. °• Please take the pain medication as prescribed. °• Do not take pain medication on an empty stomach. °• Do not drive or drink alcoholic beverages when taking pain medications. ° °CALL THE OFFICE FOR: °• Temperature above 101 degrees °• Excessive bleeding or drainage on the dressing. °• Excessive swelling, coldness, or paleness of the toes. °• Persistent nausea and vomiting. ° °FOLLOW-UP:  °• You should have an appointment to return to the office in 7-10 days after surgery.  °  °

## 2015-09-13 NOTE — Brief Op Note (Signed)
09/13/2015  8:52 AM  PATIENT:  Jocelyn Hooper  74 y.o. female  PRE-OPERATIVE DIAGNOSIS:  internal derangement right knee  POST-OPERATIVE DIAGNOSIS:  chondromalacia, meniscus tear of the lateral meniscus, right knee  PROCEDURE:  Procedure(s): ARTHROSCOPY KNEE, PARTIAL LATERAL MENISECTOMY, CHONDROPLASTY  (Right)  SURGEON:  Surgeon(s) and Role:    * Dereck Leep, MD - Primary  ASSISTANTS: none   ANESTHESIA:   general  EBL:  Total I/O In: 700 [I.V.:700] Out: 10 [Blood:10]  BLOOD ADMINISTERED:none  DRAINS: none   LOCAL MEDICATIONS USED:  MARCAINE     SPECIMEN:  No Specimen  DISPOSITION OF SPECIMEN:  N/A  COUNTS:  YES  TOURNIQUET:   not used  DICTATION: .Sales executive  PLAN OF CARE: Discharge to home after PACU  PATIENT DISPOSITION:  PACU - hemodynamically stable.   Delay start of Pharmacological VTE agent (>24hrs) due to surgical blood loss or risk of bleeding: not applicable

## 2015-09-13 NOTE — Transfer of Care (Signed)
Immediate Anesthesia Transfer of Care Note  Patient: Jocelyn Hooper  Procedure(s) Performed: Procedure(s): ARTHROSCOPY KNEE, PARTIAL LATERAL MENISECTOMY, CHONDROPLASTY  (Right)  Patient Location: PACU  Anesthesia Type:General  Level of Consciousness: awake and alert   Airway & Oxygen Therapy: Patient Spontanous Breathing and Patient connected to face mask oxygen  Post-op Assessment: Report given to RN  Post vital signs: Reviewed  Last Vitals:  Filed Vitals:   09/13/15 0614 09/13/15 0851  BP: 124/70 126/72  Pulse: 70 96  Temp: 36.6 C 35.9 C  Resp: 16 12    Complications: No apparent anesthesia complications

## 2015-09-17 NOTE — Anesthesia Postprocedure Evaluation (Signed)
Anesthesia Post Note  Patient: Jocelyn Hooper  Procedure(s) Performed: Procedure(s) (LRB): ARTHROSCOPY KNEE, PARTIAL LATERAL MENISECTOMY, CHONDROPLASTY  (Right)  Patient location during evaluation: PACU Anesthesia Type: General Level of consciousness: awake and alert Pain management: pain level controlled Vital Signs Assessment: post-procedure vital signs reviewed and stable Respiratory status: spontaneous breathing, nonlabored ventilation, respiratory function stable and patient connected to nasal cannula oxygen Cardiovascular status: blood pressure returned to baseline and stable Postop Assessment: no signs of nausea or vomiting Anesthetic complications: no    Last Vitals:  Filed Vitals:   09/13/15 1001 09/13/15 1021  BP: 128/73 128/62  Pulse: 76 77  Temp: 36.3 C   Resp: 16 16    Last Pain:  Filed Vitals:   09/13/15 1033  PainSc: [place]

## 2015-11-04 ENCOUNTER — Ambulatory Visit (INDEPENDENT_AMBULATORY_CARE_PROVIDER_SITE_OTHER): Payer: PPO | Admitting: Podiatry

## 2015-11-04 ENCOUNTER — Encounter: Payer: Self-pay | Admitting: Podiatry

## 2015-11-04 VITALS — BP 104/68 | HR 99 | Resp 12

## 2015-11-04 DIAGNOSIS — G5762 Lesion of plantar nerve, left lower limb: Secondary | ICD-10-CM

## 2015-11-04 DIAGNOSIS — L600 Ingrowing nail: Secondary | ICD-10-CM | POA: Diagnosis not present

## 2015-11-04 DIAGNOSIS — G5782 Other specified mononeuropathies of left lower limb: Secondary | ICD-10-CM

## 2015-11-04 MED ORDER — NEOMYCIN-POLYMYXIN-HC 3.5-10000-1 OT SOLN: OTIC | Status: DC

## 2015-11-04 NOTE — Patient Instructions (Signed)

## 2015-11-04 NOTE — Progress Notes (Signed)
She presents today for follow-up of her neuroma third interdigital space left foot. She states there is still become sore quite frequently. She is also complaining of a sharp incurvated toenail that has been present for proximally 5 weeks she states that she notices no infection however the tip of the nail is thick and red.  Objective: Vital signs stable alert and oriented 3. Pulses are palpable. Neurologic exam is intact. Palpable molder click third interdigital space of the left foot. Sharp incurvated nail margin tibial border hallux left with a dystrophic nail plate. No purulence no malodor.  Assessment: Chronic neuroma third interdigital space left foot. Ingrown toenail tibial border hallux right.  Plan: Reinjected another dose dehydrated alcohol third interdigital space left foot. Performed a chemical matrixectomy to the tibial border of the hallux right after local anesthesia was administered. She tolerated the procedure well. Wrote a prescription for Cortisporin Otic to be applied twice daily after soaking she was given both oral and written home-going instructions for care and soaking of her toe. I will follow-up with her in 1 week for her nail check and 3 weeks for her alcohol injection.

## 2015-11-08 ENCOUNTER — Ambulatory Visit: Payer: Medicare Other

## 2015-11-08 ENCOUNTER — Other Ambulatory Visit: Payer: Self-pay | Admitting: Orthopedic Surgery

## 2015-11-08 DIAGNOSIS — M79604 Pain in right leg: Secondary | ICD-10-CM

## 2015-11-11 ENCOUNTER — Ambulatory Visit
Admission: RE | Admit: 2015-11-11 | Discharge: 2015-11-11 | Disposition: A | Discharge status: Home / Self Care | Payer: PPO | Source: Ambulatory Visit | Attending: Orthopedic Surgery | Admitting: Orthopedic Surgery

## 2015-11-11 DIAGNOSIS — R600 Localized edema: Secondary | ICD-10-CM | POA: Diagnosis not present

## 2015-11-11 DIAGNOSIS — M79604 Pain in right leg: Secondary | ICD-10-CM | POA: Diagnosis not present

## 2015-11-11 DIAGNOSIS — M79661 Pain in right lower leg: Secondary | ICD-10-CM | POA: Diagnosis not present

## 2015-11-13 ENCOUNTER — Ambulatory Visit (INDEPENDENT_AMBULATORY_CARE_PROVIDER_SITE_OTHER): Payer: PPO | Admitting: Podiatry

## 2015-11-13 ENCOUNTER — Encounter: Payer: Self-pay | Admitting: Podiatry

## 2015-11-13 DIAGNOSIS — L600 Ingrowing nail: Secondary | ICD-10-CM

## 2015-11-13 NOTE — Progress Notes (Signed)
She presents today for follow-up matrixectomy tibial border hallux right. She states that is doing very well still little tender but feels much better than it did previously. She relates that the contralateral foot feels better and she refers to the neuroma to the third interdigital space left foot.  Objective:Marland Kitchen Vital signs are stable alert and oriented 3. Pulses are palpable. No erythema edema cellulitis drainage or odor to the surgical site hallux right.  Assessment: Well-healing matrixectomy hallux right. Neuroma third interdigital space left foot to be checked next visit.  Plan: She is due to follow up with me in 2 weeks for alcohol injection to the third interdigital space left foot. The right foot we discontinued Betadine in warm water started with Epsom salts and warm water soaks covered in the daytime and leave open at bedtime. She will continue her Cortisporin otic as well. She will continue soaking until completely resolved

## 2015-11-13 NOTE — Patient Instructions (Signed)

## 2015-11-20 DIAGNOSIS — M25561 Pain in right knee: Secondary | ICD-10-CM | POA: Diagnosis not present

## 2015-11-20 DIAGNOSIS — G8929 Other chronic pain: Secondary | ICD-10-CM | POA: Diagnosis not present

## 2015-11-25 ENCOUNTER — Ambulatory Visit (INDEPENDENT_AMBULATORY_CARE_PROVIDER_SITE_OTHER): Payer: PPO | Admitting: Podiatry

## 2015-11-25 ENCOUNTER — Encounter: Payer: Self-pay | Admitting: Podiatry

## 2015-11-25 VITALS — BP 111/69 | HR 72 | Resp 12

## 2015-11-25 DIAGNOSIS — G5762 Lesion of plantar nerve, left lower limb: Secondary | ICD-10-CM | POA: Diagnosis not present

## 2015-11-25 DIAGNOSIS — G5782 Other specified mononeuropathies of left lower limb: Secondary | ICD-10-CM

## 2015-11-25 DIAGNOSIS — G8929 Other chronic pain: Secondary | ICD-10-CM | POA: Diagnosis not present

## 2015-11-25 DIAGNOSIS — M25561 Pain in right knee: Secondary | ICD-10-CM | POA: Diagnosis not present

## 2015-11-25 NOTE — Progress Notes (Signed)
She presents today for follow-up of her alcohol injection third digit left foot. She states that she is doing proximally 75% improved at this point.  Objective: Vital signs are stable alert and oriented 3. Much decrease on palpation of the third interdigital space of the left foot. Palpable Mulder's click is present.  Assessment: Neuroma third interdigital space left foot.  Plan: Injected another dose of dehydrated alcohol and I will follow-up with her in 3 weeks.

## 2015-11-27 DIAGNOSIS — J324 Chronic pansinusitis: Secondary | ICD-10-CM | POA: Diagnosis not present

## 2015-11-27 DIAGNOSIS — R51 Headache: Secondary | ICD-10-CM | POA: Diagnosis not present

## 2015-11-28 DIAGNOSIS — M7631 Iliotibial band syndrome, right leg: Secondary | ICD-10-CM | POA: Diagnosis not present

## 2015-11-28 DIAGNOSIS — Z9889 Other specified postprocedural states: Secondary | ICD-10-CM | POA: Diagnosis not present

## 2015-12-16 ENCOUNTER — Encounter: Payer: Self-pay | Admitting: Podiatry

## 2015-12-16 ENCOUNTER — Ambulatory Visit (INDEPENDENT_AMBULATORY_CARE_PROVIDER_SITE_OTHER): Payer: PPO | Admitting: Podiatry

## 2015-12-16 ENCOUNTER — Ambulatory Visit: Payer: PPO | Admitting: Podiatry

## 2015-12-16 DIAGNOSIS — G5762 Lesion of plantar nerve, left lower limb: Secondary | ICD-10-CM | POA: Diagnosis not present

## 2015-12-16 DIAGNOSIS — G5782 Other specified mononeuropathies of left lower limb: Secondary | ICD-10-CM

## 2015-12-16 DIAGNOSIS — L820 Inflamed seborrheic keratosis: Secondary | ICD-10-CM | POA: Diagnosis not present

## 2015-12-16 DIAGNOSIS — L821 Other seborrheic keratosis: Secondary | ICD-10-CM | POA: Diagnosis not present

## 2015-12-16 DIAGNOSIS — L570 Actinic keratosis: Secondary | ICD-10-CM | POA: Diagnosis not present

## 2015-12-16 NOTE — Progress Notes (Signed)
She presents today for follow-up of her neuroma third interdigital space of the left foot. She states that it is a  proximally 95% resolved.  Objective: Vital signs stable alert and oriented 3. Pulses are palpable. Neurologic sensorium is intact. Mild tenderness on palpation third interdigital space of the left foot but much improved.  Assessment: Pain in limb secondary to neuroma third interdigital space left foot.  Plan: Injected what I have is her final dose of dehydrated alcohol. Follow up with her in 3 weeks if necessary.

## 2015-12-25 DIAGNOSIS — J324 Chronic pansinusitis: Secondary | ICD-10-CM | POA: Diagnosis not present

## 2015-12-25 DIAGNOSIS — R51 Headache: Secondary | ICD-10-CM | POA: Diagnosis not present

## 2016-01-06 ENCOUNTER — Ambulatory Visit: Payer: PPO | Admitting: Podiatry

## 2016-01-09 DIAGNOSIS — G44221 Chronic tension-type headache, intractable: Secondary | ICD-10-CM | POA: Diagnosis not present

## 2016-01-21 DIAGNOSIS — J0100 Acute maxillary sinusitis, unspecified: Secondary | ICD-10-CM | POA: Diagnosis not present

## 2016-01-21 DIAGNOSIS — E538 Deficiency of other specified B group vitamins: Secondary | ICD-10-CM | POA: Diagnosis not present

## 2016-01-30 ENCOUNTER — Other Ambulatory Visit: Payer: Self-pay | Admitting: Internal Medicine

## 2016-01-30 DIAGNOSIS — R51 Headache: Secondary | ICD-10-CM | POA: Diagnosis not present

## 2016-01-30 DIAGNOSIS — R519 Headache, unspecified: Secondary | ICD-10-CM

## 2016-01-30 DIAGNOSIS — G939 Disorder of brain, unspecified: Secondary | ICD-10-CM | POA: Diagnosis not present

## 2016-01-30 DIAGNOSIS — G500 Trigeminal neuralgia: Secondary | ICD-10-CM | POA: Diagnosis not present

## 2016-01-30 DIAGNOSIS — J0101 Acute recurrent maxillary sinusitis: Secondary | ICD-10-CM | POA: Diagnosis not present

## 2016-02-06 DIAGNOSIS — R51 Headache: Secondary | ICD-10-CM | POA: Diagnosis not present

## 2016-02-06 DIAGNOSIS — G939 Disorder of brain, unspecified: Secondary | ICD-10-CM | POA: Diagnosis not present

## 2016-02-06 DIAGNOSIS — G500 Trigeminal neuralgia: Secondary | ICD-10-CM | POA: Diagnosis not present

## 2016-02-07 ENCOUNTER — Ambulatory Visit
Admission: RE | Admit: 2016-02-07 | Discharge: 2016-02-07 | Disposition: A | Discharge status: Home / Self Care | Payer: PPO | Source: Ambulatory Visit | Attending: Internal Medicine | Admitting: Internal Medicine

## 2016-02-07 DIAGNOSIS — Z9889 Other specified postprocedural states: Secondary | ICD-10-CM | POA: Diagnosis not present

## 2016-02-07 DIAGNOSIS — R9082 White matter disease, unspecified: Secondary | ICD-10-CM | POA: Insufficient documentation

## 2016-02-07 DIAGNOSIS — G939 Disorder of brain, unspecified: Secondary | ICD-10-CM | POA: Diagnosis not present

## 2016-02-07 DIAGNOSIS — R51 Headache: Secondary | ICD-10-CM | POA: Diagnosis not present

## 2016-02-14 DIAGNOSIS — H04123 Dry eye syndrome of bilateral lacrimal glands: Secondary | ICD-10-CM | POA: Diagnosis not present

## 2016-02-18 ENCOUNTER — Ambulatory Visit: Payer: PPO

## 2016-04-06 ENCOUNTER — Ambulatory Visit (INDEPENDENT_AMBULATORY_CARE_PROVIDER_SITE_OTHER): Payer: PPO | Admitting: Podiatry

## 2016-04-06 ENCOUNTER — Encounter: Payer: Self-pay | Admitting: Podiatry

## 2016-04-06 DIAGNOSIS — G5762 Lesion of plantar nerve, left lower limb: Secondary | ICD-10-CM

## 2016-04-06 DIAGNOSIS — G5761 Lesion of plantar nerve, right lower limb: Secondary | ICD-10-CM

## 2016-04-06 DIAGNOSIS — G5782 Other specified mononeuropathies of left lower limb: Secondary | ICD-10-CM

## 2016-04-06 NOTE — Progress Notes (Signed)
She presents today with a chief complaint of a painful recurrence of her neuroma third interdigital space bilateral. She denies fever chills nausea vomiting muscle aches and pains. She denies trauma.  Objective: Signs are stable she is alert and oriented 3 pulses are palpable. Neurologic sensorium is intact per Semmes-Weinstein monofilament deep tendon reflexes are intact has pain on palpation third interdigital space bilateral. Positive Mulder's click.  Assessment: Neuroma third interdigital space bilateral.  Plan: I reinjected third interdigital space today with dehydrated alcohol and I will follow-up with her in 1 month.  Dr. Roselind Messier

## 2016-04-27 ENCOUNTER — Ambulatory Visit (INDEPENDENT_AMBULATORY_CARE_PROVIDER_SITE_OTHER): Payer: PPO | Admitting: Podiatry

## 2016-04-27 DIAGNOSIS — G5761 Lesion of plantar nerve, right lower limb: Secondary | ICD-10-CM

## 2016-04-27 DIAGNOSIS — G5762 Lesion of plantar nerve, left lower limb: Secondary | ICD-10-CM

## 2016-04-27 DIAGNOSIS — G588 Other specified mononeuropathies: Secondary | ICD-10-CM

## 2016-04-27 NOTE — Progress Notes (Signed)
She presents today for follow-up of her neuromas. She states that I can finally tell that something is starting to work. She states that I would like a double dose today so lateral have to have surgery.  Objective: Vital signs are stable alert and oriented 3 pulses are palpable. Palpable Mulder's click still very tender third interdigital space bilateral.  Assessment: Pain and limb secondary to neuroma third interdigital space bilateral.  Plan: Injected 2 mL of dehydrated alcohol and local anesthetic to the point of maximal tenderness third interdigital space bilateral. We'll follow-up with her in 1 month.

## 2016-04-28 DIAGNOSIS — M1731 Unilateral post-traumatic osteoarthritis, right knee: Secondary | ICD-10-CM | POA: Diagnosis not present

## 2016-04-28 DIAGNOSIS — M25561 Pain in right knee: Secondary | ICD-10-CM | POA: Diagnosis not present

## 2016-05-01 DIAGNOSIS — R300 Dysuria: Secondary | ICD-10-CM | POA: Diagnosis not present

## 2016-05-01 DIAGNOSIS — B029 Zoster without complications: Secondary | ICD-10-CM | POA: Diagnosis not present

## 2016-05-07 DIAGNOSIS — N3090 Cystitis, unspecified without hematuria: Secondary | ICD-10-CM | POA: Diagnosis not present

## 2016-05-07 DIAGNOSIS — B029 Zoster without complications: Secondary | ICD-10-CM | POA: Diagnosis not present

## 2016-05-15 DIAGNOSIS — R3911 Hesitancy of micturition: Secondary | ICD-10-CM | POA: Diagnosis not present

## 2016-05-15 DIAGNOSIS — N320 Bladder-neck obstruction: Secondary | ICD-10-CM | POA: Insufficient documentation

## 2016-05-15 DIAGNOSIS — B0223 Postherpetic polyneuropathy: Secondary | ICD-10-CM | POA: Diagnosis not present

## 2016-05-15 DIAGNOSIS — N200 Calculus of kidney: Secondary | ICD-10-CM | POA: Diagnosis not present

## 2016-05-15 DIAGNOSIS — R143 Flatulence: Secondary | ICD-10-CM | POA: Diagnosis not present

## 2016-05-15 DIAGNOSIS — R339 Retention of urine, unspecified: Secondary | ICD-10-CM | POA: Diagnosis not present

## 2016-05-15 DIAGNOSIS — Z9889 Other specified postprocedural states: Secondary | ICD-10-CM | POA: Diagnosis not present

## 2016-05-15 DIAGNOSIS — N3020 Other chronic cystitis without hematuria: Secondary | ICD-10-CM | POA: Diagnosis not present

## 2016-05-17 DIAGNOSIS — B0223 Postherpetic polyneuropathy: Secondary | ICD-10-CM | POA: Insufficient documentation

## 2016-05-20 ENCOUNTER — Ambulatory Visit: Payer: Self-pay | Admitting: Urology

## 2016-05-25 ENCOUNTER — Ambulatory Visit: Payer: PPO | Admitting: Podiatry

## 2016-05-27 ENCOUNTER — Ambulatory Visit (INDEPENDENT_AMBULATORY_CARE_PROVIDER_SITE_OTHER): Payer: PPO | Admitting: Podiatry

## 2016-05-27 ENCOUNTER — Encounter: Payer: Self-pay | Admitting: Podiatry

## 2016-05-27 DIAGNOSIS — G5762 Lesion of plantar nerve, left lower limb: Secondary | ICD-10-CM

## 2016-05-27 DIAGNOSIS — G5761 Lesion of plantar nerve, right lower limb: Secondary | ICD-10-CM | POA: Diagnosis not present

## 2016-05-27 DIAGNOSIS — G588 Other specified mononeuropathies: Secondary | ICD-10-CM

## 2016-05-27 NOTE — Progress Notes (Signed)
Lewis presents today for follow-up of neuroma to the third interspace of the bilateral foot. She states that she is greater than 80% improved.  Objective: She has pain on palpation third interdigital space left greater than right. Pulses remain palpable pain.  Assessment: Neuroma third interdigital space bilateral left greater than right.  Plan: Injected dehydrated alcohol to the third interdigital space today. Follow up with her in 1 month.

## 2016-05-28 DIAGNOSIS — N3020 Other chronic cystitis without hematuria: Secondary | ICD-10-CM | POA: Diagnosis not present

## 2016-05-28 DIAGNOSIS — N816 Rectocele: Secondary | ICD-10-CM | POA: Insufficient documentation

## 2016-06-16 DIAGNOSIS — Z79899 Other long term (current) drug therapy: Secondary | ICD-10-CM | POA: Diagnosis not present

## 2016-06-16 DIAGNOSIS — E538 Deficiency of other specified B group vitamins: Secondary | ICD-10-CM | POA: Diagnosis not present

## 2016-06-22 DIAGNOSIS — Z0000 Encounter for general adult medical examination without abnormal findings: Secondary | ICD-10-CM | POA: Diagnosis not present

## 2016-06-22 DIAGNOSIS — M6530 Trigger finger, unspecified finger: Secondary | ICD-10-CM | POA: Diagnosis not present

## 2016-06-29 ENCOUNTER — Ambulatory Visit: Payer: PPO | Admitting: Podiatry

## 2016-07-01 DIAGNOSIS — M67442 Ganglion, left hand: Secondary | ICD-10-CM | POA: Diagnosis not present

## 2016-07-01 DIAGNOSIS — M5136 Other intervertebral disc degeneration, lumbar region: Secondary | ICD-10-CM | POA: Diagnosis not present

## 2016-09-03 DIAGNOSIS — M5136 Other intervertebral disc degeneration, lumbar region: Secondary | ICD-10-CM | POA: Diagnosis not present

## 2016-09-03 DIAGNOSIS — M5416 Radiculopathy, lumbar region: Secondary | ICD-10-CM | POA: Diagnosis not present

## 2016-10-05 DIAGNOSIS — Z1231 Encounter for screening mammogram for malignant neoplasm of breast: Secondary | ICD-10-CM | POA: Diagnosis not present

## 2016-10-06 ENCOUNTER — Telehealth: Payer: Self-pay | Admitting: Cardiovascular Disease

## 2016-10-06 ENCOUNTER — Ambulatory Visit (INDEPENDENT_AMBULATORY_CARE_PROVIDER_SITE_OTHER): Payer: PPO | Admitting: Physician Assistant

## 2016-10-06 ENCOUNTER — Observation Stay
Admission: AD | Admit: 2016-10-06 | Discharge: 2016-10-07 | Disposition: A | Discharge status: Home / Self Care | Payer: PPO | Source: Ambulatory Visit | Attending: Internal Medicine | Admitting: Internal Medicine

## 2016-10-06 ENCOUNTER — Encounter: Payer: Self-pay | Admitting: Physician Assistant

## 2016-10-06 VITALS — BP 122/78 | HR 79 | Ht 65.5 in | Wt 156.2 lb

## 2016-10-06 DIAGNOSIS — E785 Hyperlipidemia, unspecified: Secondary | ICD-10-CM | POA: Insufficient documentation

## 2016-10-06 DIAGNOSIS — D696 Thrombocytopenia, unspecified: Secondary | ICD-10-CM | POA: Insufficient documentation

## 2016-10-06 DIAGNOSIS — K219 Gastro-esophageal reflux disease without esophagitis: Secondary | ICD-10-CM | POA: Insufficient documentation

## 2016-10-06 DIAGNOSIS — N289 Disorder of kidney and ureter, unspecified: Secondary | ICD-10-CM | POA: Diagnosis not present

## 2016-10-06 DIAGNOSIS — Z79890 Hormone replacement therapy (postmenopausal): Secondary | ICD-10-CM | POA: Insufficient documentation

## 2016-10-06 DIAGNOSIS — R079 Chest pain, unspecified: Secondary | ICD-10-CM

## 2016-10-06 DIAGNOSIS — R0789 Other chest pain: Principal | ICD-10-CM | POA: Insufficient documentation

## 2016-10-06 DIAGNOSIS — R0602 Shortness of breath: Secondary | ICD-10-CM | POA: Insufficient documentation

## 2016-10-06 DIAGNOSIS — Z7982 Long term (current) use of aspirin: Secondary | ICD-10-CM | POA: Diagnosis not present

## 2016-10-06 DIAGNOSIS — E782 Mixed hyperlipidemia: Secondary | ICD-10-CM | POA: Diagnosis not present

## 2016-10-06 DIAGNOSIS — Z8249 Family history of ischemic heart disease and other diseases of the circulatory system: Secondary | ICD-10-CM | POA: Diagnosis not present

## 2016-10-06 DIAGNOSIS — Z79899 Other long term (current) drug therapy: Secondary | ICD-10-CM | POA: Diagnosis not present

## 2016-10-06 DIAGNOSIS — Z96652 Presence of left artificial knee joint: Secondary | ICD-10-CM | POA: Insufficient documentation

## 2016-10-06 DIAGNOSIS — I200 Unstable angina: Secondary | ICD-10-CM | POA: Diagnosis not present

## 2016-10-06 LAB — BASIC METABOLIC PANEL
ANION GAP: 6 (ref 5–15)
BUN: 19 mg/dL (ref 6–20)
CALCIUM: 9.1 mg/dL (ref 8.9–10.3)
CHLORIDE: 108 mmol/L (ref 101–111)
CO2: 22 mmol/L (ref 22–32)
CREATININE: 1.02 mg/dL — AB (ref 0.44–1.00)
GFR calc non Af Amer: 52 mL/min — ABNORMAL LOW (ref >60)
GFR, EST AFRICAN AMERICAN: 60 mL/min — AB (ref >60)
Glucose, Bld: 141 mg/dL — ABNORMAL HIGH (ref 65–99)
Potassium: 3.9 mmol/L (ref 3.5–5.1)
SODIUM: 136 mmol/L (ref 135–145)

## 2016-10-06 LAB — CBC
HCT: 39.3 % (ref 35.0–47.0)
HEMOGLOBIN: 13.3 g/dL (ref 12.0–16.0)
MCH: 31.8 pg (ref 26.0–34.0)
MCHC: 33.8 g/dL (ref 32.0–36.0)
MCV: 94.3 fL (ref 80.0–100.0)
Platelets: 147 10*3/uL — ABNORMAL LOW (ref 150–440)
RBC: 4.17 MIL/uL (ref 3.80–5.20)
RDW: 13.5 % (ref 11.5–14.5)
WBC: 9.3 10*3/uL (ref 3.6–11.0)

## 2016-10-06 LAB — TROPONIN I

## 2016-10-06 MED ORDER — DOCUSATE SODIUM 100 MG PO CAPS
100.0000 mg | ORAL_CAPSULE | Freq: Every day | ORAL | Status: DC
  Administered 2016-10-07: 100 mg via ORAL
  Filled 2016-10-06: qty 1

## 2016-10-06 MED ORDER — ACETAMINOPHEN 325 MG PO TABS: 650.0000 mg | ORAL_TABLET | ORAL | Status: DC | PRN

## 2016-10-06 MED ORDER — NITROGLYCERIN 0.4 MG SL SUBL: 0.4000 mg | SUBLINGUAL_TABLET | SUBLINGUAL | Status: DC | PRN

## 2016-10-06 MED ORDER — GI COCKTAIL ~~LOC~~
30.0000 mL | Freq: Four times a day (QID) | ORAL | Status: DC | PRN
  Filled 2016-10-06: qty 30

## 2016-10-06 MED ORDER — PANTOPRAZOLE SODIUM 20 MG PO TBEC
20.0000 mg | DELAYED_RELEASE_TABLET | Freq: Every day | ORAL | Status: DC
  Filled 2016-10-06: qty 1

## 2016-10-06 MED ORDER — ASPIRIN EC 325 MG PO TBEC
325.0000 mg | DELAYED_RELEASE_TABLET | Freq: Every day | ORAL | Status: DC
  Administered 2016-10-07: 325 mg via ORAL
  Filled 2016-10-06: qty 1

## 2016-10-06 MED ORDER — TOPIRAMATE 25 MG PO TABS
150.0000 mg | ORAL_TABLET | Freq: Every day | ORAL | Status: DC
  Administered 2016-10-06: 150 mg via ORAL
  Filled 2016-10-06: qty 2

## 2016-10-06 MED ORDER — LORATADINE 10 MG PO TABS
10.0000 mg | ORAL_TABLET | Freq: Every day | ORAL | Status: DC
  Administered 2016-10-07: 10 mg via ORAL
  Filled 2016-10-06: qty 1

## 2016-10-06 MED ORDER — ENOXAPARIN SODIUM 40 MG/0.4ML ~~LOC~~ SOLN
40.0000 mg | SUBCUTANEOUS | Status: DC
  Administered 2016-10-06: 40 mg via SUBCUTANEOUS
  Filled 2016-10-06: qty 0.4

## 2016-10-06 MED ORDER — ATORVASTATIN CALCIUM 20 MG PO TABS: 40.0000 mg | ORAL_TABLET | Freq: Every day | ORAL | Status: DC

## 2016-10-06 MED ORDER — ONDANSETRON HCL 4 MG/2ML IJ SOLN: 4.0000 mg | Freq: Four times a day (QID) | INTRAMUSCULAR | Status: DC | PRN

## 2016-10-06 MED ORDER — FLUTICASONE PROPIONATE 50 MCG/ACT NA SUSP
2.0000 | Freq: Every day | NASAL | Status: DC
Start: 2016-10-07 — End: 2016-10-07
  Administered 2016-10-07: 2 via NASAL
  Filled 2016-10-06: qty 16

## 2016-10-06 MED ORDER — ONDANSETRON 4 MG PO TBDP
4.0000 mg | ORAL_TABLET | Freq: Three times a day (TID) | ORAL | Status: DC | PRN
  Filled 2016-10-06: qty 1

## 2016-10-06 MED ORDER — MELATONIN 10 MG PO TABS
10.0000 | ORAL_TABLET | Freq: Every day | ORAL | Status: DC
  Administered 2016-10-06: 10 via ORAL
  Filled 2016-10-06 (×2): qty 10

## 2016-10-06 MED ORDER — GABAPENTIN 300 MG PO CAPS
300.0000 mg | ORAL_CAPSULE | Freq: Every day | ORAL | Status: DC
  Administered 2016-10-06: 300 mg via ORAL
  Filled 2016-10-06: qty 1

## 2016-10-06 NOTE — Addendum Note (Signed)
Addended by: Britt Bottom on: 10/06/2016 03:35 PM   Modules accepted: Orders

## 2016-10-06 NOTE — H&P (Signed)
[facility] NAME: Jocelyn Hooper    MR#:  [id number]  DATE OF BIRTH:  [birth year]  DATE OF ADMISSION:  10/06/2016  PRIMARY CARE PHYSICIAN: Rusty Aus, MD   REQUESTING/REFERRING PHYSICIAN: Dr.Gollan.  CHIEF COMPLAINT:  No chief complaint on file.  Chest pain for 2 days. HISTORY OF PRESENT ILLNESS:  Jocelyn Hooper  is a 76 y.o. female with a known history of Chest pain, kidney stone, colonic polyps and GERD. The patient has had chest pain on exertion for 2 days. The chest pain is 7/10, in substernal area, heaviness with radiation to left shoulder and down to her left arm. The chest pain lasted about 45 minutes after rest, associated with diaphoresis. The patient denies any other symptoms. The patient had chest pain in cardiologist office today and then sent to the hospital for direct admission. Cardiology PA give aspirin and Nitrol in the office. He suggested the patient has unstable angina, follow-up troponin and stress test tomorrow.  PAST MEDICAL HISTORY:   Past Medical History:  Diagnosis Date  . Bursitis of left shoulder   . Chest pain, unspecified   . Chronic kidney disease    KIDNEY STONE CURRENTLY-08-2015-SEES DR COPE  . Complication of anesthesia    PT HAS SEVERE SCOLIOSIS AND FOR TKR ANESTHESIA COULD NOT GET A SPINAL  . CSF leak   . DDD (degenerative disc disease), cervical   . Diverticulosis   . Esophageal reflux   . Fibrocystic breast disease   . GERD (gastroesophageal reflux disease)   . Headache    H/O  . History of colon polyps   . IBS (irritable bowel syndrome)   . Nasal polyps   . Other and unspecified hyperlipidemia   . Scoliosis   . Unspecified sinusitis (chronic)     PAST SURGICAL HISTORY:   Past Surgical History:  Procedure Laterality Date  . ABDOMINAL HYSTERECTOMY    . APPENDECTOMY    . [place]  . [place]     left   . CATARACT  EXTRACTION     right  . CHOLECYSTECTOMY  2002  . hysterectomy (other)  1974  . JOINT REPLACEMENT     LEFT knee  . KIDNEY STONE SURGERY    . KNEE ARTHROSCOPY Right 09/13/2015   Procedure: ARTHROSCOPY KNEE, PARTIAL LATERAL MENISECTOMY, CHONDROPLASTY ;  Surgeon: Dereck Leep, MD;  Location: [facility] ORS;  Service: Orthopedics;  Laterality: Right;  . KNEE SURGERY  2004   having a lot of problems with left knee  . [place], 2003, 2012  . sinus procedures  1970,1990,1994,2005  . TONSILLECTOMY      SOCIAL HISTORY:   Social History  Substance Use Topics  . Smoking status: Never Smoker  . Smokeless tobacco: Never Used  . Alcohol use No    FAMILY HISTORY:   Family History  Problem Relation Age of Onset  . Heart attack Mother 53  . Heart failure Mother   . Heart attack Father   . Heart disease Father     CABG  . Heart attack Brother 65    premature    DRUG ALLERGIES:   Allergies  Allergen Reactions  . Avelox [Moxifloxacin Hcl In Nacl] Other (See Comments)    Redness all over in about 5 minutes after taking  . Codeine Nausea Only    REACTION: nausea  .  Shrimp [Shellfish Allergy]     Hives    . Ciprofloxacin Rash  . Moxifloxacin Hives and Rash    Other reaction(s): OTHER    REVIEW OF SYSTEMS:   Review of Systems  Constitutional: Positive for diaphoresis. Negative for chills, fever and malaise/fatigue.  HENT: Negative for congestion.   Eyes: Negative for blurred vision and double vision.  Respiratory: Negative for cough, shortness of breath and stridor.   Cardiovascular: Positive for chest pain. Negative for palpitations, orthopnea and leg swelling.  Gastrointestinal: Negative for abdominal pain, blood in stool, diarrhea, melena, nausea and vomiting.  Genitourinary: Negative for dysuria and frequency.  Musculoskeletal: Negative for joint pain.  Skin: Negative for itching and rash.  Neurological: Negative for dizziness, focal weakness,  loss of consciousness and weakness.  Psychiatric/Behavioral: Negative for depression. The patient is not nervous/anxious.     MEDICATIONS AT HOME:   Prior to Admission medications   Medication Sig Start Date End Date Taking? Authorizing Provider  aspirin (ASPIR-81) 81 MG EC tablet Take 81 mg by mouth daily.      Historical Provider, MD  Calcium Carbonate-Vitamin D (CALTRATE 600+D) 600-400 MG-UNIT per tablet Take 1 tablet by mouth 2 (two) times daily.      Historical Provider, MD  Docusate Calcium (STOOL SOFTENER PO) Take by mouth daily.    Historical Provider, MD  estradiol (ESTRACE) 1 MG tablet Take by mouth. 04/28/16 04/28/17  Historical Provider, MD  fluticasone (FLONASE) 50 MCG/ACT nasal spray Place 2 sprays into both nostrils daily.  01/09/14   Historical Provider, MD  gabapentin (NEURONTIN) 300 MG capsule Take 300 mg by mouth at bedtime.     Historical Provider, MD  Lansoprazole (PREVACID PO) Take 10 mg by mouth every morning.    Historical Provider, MD  lansoprazole (PREVACID) 30 MG capsule Take 30 mg by mouth at bedtime.     Historical Provider, MD  loratadine (CLARITIN) 10 MG tablet Take 10 mg by mouth daily.    Historical Provider, MD  Melatonin 10 MG TABS Take 10 tablets by mouth at bedtime.    Historical Provider, MD  meloxicam (MOBIC) 7.5 MG tablet Take 7.5 mg by mouth daily.    Historical Provider, MD  neomycin-polymyxin-hydrocortisone (CORTISPORIN) otic solution Apply one to two drops to toe after soaking twice daily. 11/04/15   Max T Hyatt, DPM  ondansetron (ZOFRAN ODT) 4 MG disintegrating tablet Take 1 tablet (4 mg total) by mouth every 8 (eight) hours as needed for nausea or vomiting. 04/15/15   Max T Hyatt, DPM  topiramate (TOPAMAX) 100 MG tablet Take 150 mg by mouth at bedtime. 1 1/2 tab    Historical Provider, MD  vitamin B-12 (CYANOCOBALAMIN) 1000 MCG tablet Take 1,000 mcg by mouth daily.    Historical Provider, MD  vitamin C (ASCORBIC ACID) 500 MG tablet Take 500 mg by mouth 2  (two) times daily.     Historical Provider, MD      VITAL SIGNS:  There were no vitals taken for this visit.  PHYSICAL EXAMINATION:  Physical Exam  GENERAL:  76 y.o.-year-old patient lying in the bed with no acute distress.  EYES: Pupils equal, round, reactive to light and accommodation. No scleral icterus. Extraocular muscles intact.  HEENT: Head atraumatic, normocephalic. Oropharynx and nasopharynx clear.  NECK:  Supple, no jugular venous distention. No thyroid enlargement, no tenderness.  LUNGS: Normal breath sounds bilaterally, no wheezing, rales,rhonchi or crepitation. No use of accessory muscles of respiration.  CARDIOVASCULAR: S1, S2 normal. No murmurs,  rubs, or gallops. No chest tenderness on palpation. ABDOMEN: Soft, nontender, nondistended. Bowel sounds present. No organomegaly or mass.  EXTREMITIES: No pedal edema, cyanosis, or clubbing.  NEUROLOGIC: Cranial nerves II through XII are intact. Muscle strength 5/5 in all extremities. Sensation intact. Gait not checked.  PSYCHIATRIC: The patient is alert and oriented x 3.  SKIN: No obvious rash, lesion, or ulcer.   LABORATORY PANEL:   CBC No results for input(s): WBC, HGB, HCT, PLT in the last 168 hours. ------------------------------------------------------------------------------------------------------------------  Chemistries  No results for input(s): NA, K, CL, CO2, GLUCOSE, BUN, CREATININE, CALCIUM, MG, AST, ALT, ALKPHOS, BILITOT in the last 168 hours.  Invalid input(s): GFRCGP ------------------------------------------------------------------------------------------------------------------  Cardiac Enzymes No results for input(s): TROPONINI in the last 168 hours. ------------------------------------------------------------------------------------------------------------------  RADIOLOGY:  No results found.    IMPRESSION AND PLAN:   Unstable angina The patient will be placed for observation. Telemetry  monitor, follow-up troponin level, continue aspirin and add Lipitor. Echo and lipid panel. Stress test tomorrow per cardiologist.  HLP. lipitor.  GERD. GI cocktail. All the records are reviewed and case discussed with ED provider. Management plans discussed with the patient, family and they are in agreement.  CODE STATUS: Full code  TOTAL TIME TAKING CARE OF THIS PATIENT: 45 minutes.    Demetrios Loll M.D on 10/06/2016 at 5:39 PM  Between 7am to 6pm - Pager - [phone]  After 6pm go to [email or web address] - Proofreader  [facility]  Office  [phone]  CC: Primary care physician; Rusty Aus, MD   Note: This dictation was prepared with Dragon dictation along with smaller phrase technology. Any transcriptional errors that result from this process are unintentional.

## 2016-10-06 NOTE — Telephone Encounter (Signed)
Spoke w/ pt.  She reports chest tightness and heaviness. She is volunteering at the hospital and would like to be evaluated today, is possible.  Added pt on to see Christell Faith, PA today @ 2:30.  Advised her to proceed to the ED if sx become emergent.

## 2016-10-06 NOTE — Progress Notes (Signed)
Cardiology Office Note Date:  10/06/2016  Patient ID:  Jocelyn Hooper, DOB Jan 12, 1941, MRN [id number] PCP:  Rusty Aus, MD  Cardiologist:  Dr. Rockey Situ, MD    Chief Complaint: Chest pain  History of Present Illness: Jocelyn Hooper is a 76 y.o. female with history of She waslast seen in 2015 for evaluation of LE swelling, tingling, and SOB with exertion. She undewent stress echo that possible hypokinesis of the lateral wall with normal EF. Recommendations included exercise regimen vs cardiac cath if her symptoms persisted. She was lost to follow up.   She originally had an appoinement scheduled with Dr. Rockey Situ for 10/09/16 to discuss some issues with chest pain. She called the office on 1/23 stating she would like to be seen today. In the office she tells me for the past 2 days she has had exertional substernal chest heaviness that radiates to her left shoulder and down her left arm. Pain is 7/10. Pain is worse with exertion and will improve with rest. Pain last 4-5 minutes after rest and will persist as long as she is exerting herself. There is associated diaphoresis. No associated nausea, vomiting, palpitations, dizziness, presyncope, or syncope. She has had two episodes of this pain today. First episode was this morning associated with ambulation and the 2nd episode was around 1 PM while walking down the [place] hallway. She does not actively have chest pain at this time in the office.    Past Medical History:  Diagnosis Date  . Bursitis of left shoulder   . Chest pain, unspecified   . Chronic kidney disease    KIDNEY STONE CURRENTLY-08-2015-SEES DR COPE  . Complication of anesthesia    PT HAS SEVERE SCOLIOSIS AND FOR TKR ANESTHESIA COULD NOT GET A SPINAL  . CSF leak   . DDD (degenerative disc disease), cervical   . Diverticulosis   . Esophageal reflux   . Fibrocystic breast disease   . GERD (gastroesophageal reflux disease)   . Headache    H/O  . History of colon polyps   . IBS  (irritable bowel syndrome)   . Nasal polyps   . Other and unspecified hyperlipidemia   . Scoliosis   . Unspecified sinusitis (chronic)     Past Surgical History:  Procedure Laterality Date  . ABDOMINAL HYSTERECTOMY    . APPENDECTOMY    . [place]  . [place]     left   . CATARACT EXTRACTION     right  . CHOLECYSTECTOMY  2002  . hysterectomy (other)  1974  . JOINT REPLACEMENT     LEFT knee  . KIDNEY STONE SURGERY    . KNEE ARTHROSCOPY Right 09/13/2015   Procedure: ARTHROSCOPY KNEE, PARTIAL LATERAL MENISECTOMY, CHONDROPLASTY ;  Surgeon: Dereck Leep, MD;  Location: [facility] ORS;  Service: Orthopedics;  Laterality: Right;  . KNEE SURGERY  2004   having a lot of problems with left knee  . [place], 2003, 2012  . sinus procedures  1970,1990,1994,2005  . TONSILLECTOMY      Current Outpatient Prescriptions  Medication Sig Dispense Refill  . aspirin (ASPIR-81) 81 MG EC tablet Take 81 mg by mouth daily.      . Calcium Carbonate-Vitamin D (CALTRATE 600+D) 600-400 MG-UNIT per tablet Take 1 tablet by mouth 2 (two) times daily.      Mariane Baumgarten Calcium (STOOL SOFTENER PO) Take  by mouth daily.    . fluticasone (FLONASE) 50 MCG/ACT nasal spray Place 2 sprays into both nostrils daily.     Marland Kitchen gabapentin (NEURONTIN) 300 MG capsule Take 300 mg by mouth at bedtime.     . Lansoprazole (PREVACID PO) Take 10 mg by mouth every morning.    . lansoprazole (PREVACID) 30 MG capsule Take 30 mg by mouth at bedtime.     Marland Kitchen loratadine (CLARITIN) 10 MG tablet Take 10 mg by mouth daily.    . Melatonin 10 MG TABS Take 10 tablets by mouth at bedtime.    . meloxicam (MOBIC) 7.5 MG tablet Take 7.5 mg by mouth daily.    Marland Kitchen neomycin-polymyxin-hydrocortisone (CORTISPORIN) otic solution Apply one to two drops to toe after soaking twice daily. 10 mL 0  . ondansetron (ZOFRAN ODT) 4 MG disintegrating tablet Take 1 tablet (4 mg  total) by mouth every 8 (eight) hours as needed for nausea or vomiting. 20 tablet 0  . topiramate (TOPAMAX) 100 MG tablet Take 150 mg by mouth at bedtime. 1 1/2 tab    . vitamin B-12 (CYANOCOBALAMIN) 1000 MCG tablet Take 1,000 mcg by mouth daily.    . vitamin C (ASCORBIC ACID) 500 MG tablet Take 500 mg by mouth 2 (two) times daily.      No current facility-administered medications for this visit.     Allergies:   Avelox [moxifloxacin hcl in nacl] and Codeine   Social History:  The patient  reports that she has never smoked. She has never used smokeless tobacco. She reports that she does not drink alcohol or use drugs.   Family History:  The patient's family history includes Heart attack in her father; Heart attack (age of onset: 51) in her brother; Heart attack (age of onset: 26) in her mother; Heart disease in her father; Heart failure in her mother.  ROS:   Review of Systems  Constitutional: Positive for diaphoresis and malaise/fatigue. Negative for chills, fever and weight loss.  HENT: Negative for congestion.   Eyes: Negative for discharge and redness.  Respiratory: Negative for cough, hemoptysis, sputum production, shortness of breath and wheezing.   Cardiovascular: Positive for chest pain. Negative for palpitations, orthopnea, claudication, leg swelling and PND.  Gastrointestinal: Negative for abdominal pain, blood in stool, heartburn, melena, nausea and vomiting.  Genitourinary: Negative for hematuria.  Musculoskeletal: Negative for falls and myalgias.  Skin: Negative for rash.  Neurological: Positive for weakness. Negative for dizziness, tingling, tremors, sensory change, speech change, focal weakness and loss of consciousness.  Endo/Heme/Allergies: Does not bruise/bleed easily.  Psychiatric/Behavioral: Negative for substance abuse. The patient is not nervous/anxious.   All other systems reviewed and are negative.    PHYSICAL EXAM:  VS:  BP 122/78 (BP Location: Left Arm,  Patient Position: Sitting, Cuff Size: Normal)   Pulse 79   Ht 5' 5.5" (1.664 m)   Wt 156 lb 4 oz (70.9 kg)   BMI 25.61 kg/m  BMI: Body mass index is 25.61 kg/m.  Physical Exam  Constitutional: She is oriented to person, place, and time. She appears well-developed and well-nourished.  HENT:  Head: Normocephalic and atraumatic.  Eyes: Right eye exhibits no discharge. Left eye exhibits no discharge.  Neck: Normal range of motion. No JVD present.  Cardiovascular: Normal rate, regular rhythm, S1 normal, S2 normal and normal heart sounds.  Exam reveals no distant heart sounds, no friction rub, no midsystolic click and no opening snap.   No murmur heard. Palpation of the chest  wall does not reproduce her symptoms  Pulmonary/Chest: Effort normal and breath sounds normal. No respiratory distress. She has no decreased breath sounds. She has no wheezes. She has no rales. She exhibits no tenderness.  Abdominal: Soft. She exhibits no distension. There is no tenderness.  Musculoskeletal: She exhibits no edema.  Neurological: She is alert and oriented to person, place, and time.  Skin: Skin is warm and dry. No cyanosis. Nails show no clubbing.  Psychiatric: She has a normal mood and affect. Her speech is normal and behavior is normal. Judgment and thought content normal.     EKG:  Was ordered and interpreted by me today. Shows NSR, 79 bpm, low voltage QRS, poor R wave progression, nonspecific inferolateral st/t changes   Recent Labs: Reviewed most recent labs from [facility].     Wt Readings from Last 3 Encounters:  10/06/16 156 lb 4 oz (70.9 kg)  09/13/15 154 lb (69.9 kg)  06/12/15 156 lb (70.8 kg)     Other studies reviewed: Additional studies/records reviewed today include: summarized above  ASSESSMENT AND PLAN:  1. Chest pain with moderate risk for cardiac etiology: Currently chest pain free. Discussed all evaluation and treatment options with the patient in detail. It was  recommended she be direct admitted by hospitalist for rule out. Given she has had multiple episodes of her chest pain today we will not be able to rule her out with a single troponin as an outpatient. Discussed with Dr. Bridgett Larsson, MD who will be accepting the patient. She was given ASA 325 mg in the office. I did not give SL NTG at this time as she was chest pain free. If she rules out, we will plan for Lexiscan Myoview on 1/24 to evaluate for high-risk ischemia. If she rules in she will need LHC on 1/24. Will need to be NPO except for medications after midnight. Check echo as an inpatient as well as fasting lipid panel and A1C for further risk stratification.   2. HLD: As above.   Disposition: F/u with Dr. Rockey Situ s/p hospital admission   Current medicines are reviewed at length with the patient today.  The patient did not have any concerns regarding medicines.  Melvern Banker PA-C 10/06/2016 2:47 PM     [address] [place], [place] [address] [phone]

## 2016-10-06 NOTE — Telephone Encounter (Signed)
Pt states when she walks a long distance, she gets real hot, and "feels funny" States this morning she got up and went to the bathroom, states after she got back into the bed, she felt real hot and her chest felt real tight.  Pt c/o of Chest Pain: STAT if CP now or developed within 24 hours  1. Are you having CP right now? No, she is sitting down  2. Are you experiencing any other symptoms (ex. SOB, nausea, vomiting, sweating)? sweating  3. How long have you been experiencing CP? Yesterday and today  4. Is your CP continuous or coming and going? Comes and goes   5. Have you taken Nitroglycerin? No  Pt is scheduled with Dr. Rockey Situ on 1/26 ?

## 2016-10-07 ENCOUNTER — Encounter: Payer: Self-pay | Admitting: Radiology

## 2016-10-07 ENCOUNTER — Observation Stay (HOSPITAL_BASED_OUTPATIENT_CLINIC_OR_DEPARTMENT_OTHER)
Admission: AD | Admit: 2016-10-07 | Discharge: 2016-10-07 | Disposition: A | Discharge status: Home / Self Care | Payer: PPO | Source: Ambulatory Visit | Attending: Physician Assistant | Admitting: Physician Assistant

## 2016-10-07 ENCOUNTER — Telehealth: Payer: Self-pay | Admitting: Cardiovascular Disease

## 2016-10-07 ENCOUNTER — Observation Stay (HOSPITAL_BASED_OUTPATIENT_CLINIC_OR_DEPARTMENT_OTHER): Payer: PPO

## 2016-10-07 DIAGNOSIS — K219 Gastro-esophageal reflux disease without esophagitis: Secondary | ICD-10-CM

## 2016-10-07 DIAGNOSIS — I200 Unstable angina: Secondary | ICD-10-CM | POA: Diagnosis not present

## 2016-10-07 DIAGNOSIS — E785 Hyperlipidemia, unspecified: Secondary | ICD-10-CM | POA: Diagnosis not present

## 2016-10-07 DIAGNOSIS — R079 Chest pain, unspecified: Secondary | ICD-10-CM

## 2016-10-07 DIAGNOSIS — N289 Disorder of kidney and ureter, unspecified: Secondary | ICD-10-CM | POA: Diagnosis not present

## 2016-10-07 DIAGNOSIS — R0789 Other chest pain: Secondary | ICD-10-CM | POA: Diagnosis not present

## 2016-10-07 DIAGNOSIS — D696 Thrombocytopenia, unspecified: Secondary | ICD-10-CM

## 2016-10-07 LAB — LIPID PANEL
CHOL/HDL RATIO: 2.6 ratio
Cholesterol: 198 mg/dL (ref 0–200)
HDL: 75 mg/dL (ref >40)
LDL Cholesterol: 105 mg/dL — ABNORMAL HIGH (ref 0–99)
Triglycerides: 88 mg/dL (ref <150)
VLDL: 18 mg/dL (ref 0–40)

## 2016-10-07 LAB — ECHOCARDIOGRAM COMPLETE
HEIGHTINCHES: 65.5 in
WEIGHTICAEL: 2500 [oz_av]

## 2016-10-07 LAB — NM MYOCAR MULTI W/SPECT W/WALL MOTION / EF
CHL CUP RESTING HR STRESS: 68 {beats}/min
LV dias vol: 60 mL (ref 46–106)
LV sys vol: 14 mL
Peak HR: 116 {beats}/min
Percent HR: 81 %
TID: 1.07

## 2016-10-07 LAB — TROPONIN I

## 2016-10-07 MED ORDER — PANTOPRAZOLE SODIUM 40 MG PO TBEC
40.0000 mg | DELAYED_RELEASE_TABLET | Freq: Every day | ORAL | Status: DC
  Administered 2016-10-07: 40 mg via ORAL
  Filled 2016-10-07: qty 1

## 2016-10-07 MED ORDER — IBUPROFEN 400 MG PO TABS
200.0000 mg | ORAL_TABLET | Freq: Four times a day (QID) | ORAL | Status: DC | PRN
  Administered 2016-10-07: 200 mg via ORAL
  Filled 2016-10-07: qty 1

## 2016-10-07 MED ORDER — TECHNETIUM TC 99M TETROFOSMIN IV KIT
32.1700 | PACK | Freq: Once | INTRAVENOUS | Status: AC | PRN
  Administered 2016-10-07: 32.17 via INTRAVENOUS

## 2016-10-07 MED ORDER — METOPROLOL TARTRATE 25 MG PO TABS: 12.5000 mg | ORAL_TABLET | Freq: Two times a day (BID) | ORAL | Status: DC

## 2016-10-07 MED ORDER — METOPROLOL TARTRATE 25 MG PO TABS
12.5000 mg | ORAL_TABLET | Freq: Two times a day (BID) | ORAL | 6 refills | Status: DC
Start: 2016-10-07 — End: 2018-05-18

## 2016-10-07 MED ORDER — TECHNETIUM TC 99M TETROFOSMIN IV KIT
10.6700 | PACK | Freq: Once | INTRAVENOUS | Status: AC | PRN
  Administered 2016-10-07: 10.67 via INTRAVENOUS

## 2016-10-07 MED ORDER — PANTOPRAZOLE SODIUM 40 MG PO TBEC: 40.0000 mg | DELAYED_RELEASE_TABLET | Freq: Every day | ORAL | 6 refills | Status: DC

## 2016-10-07 MED ORDER — ASPIRIN EC 81 MG PO TBEC: 81.0000 mg | DELAYED_RELEASE_TABLET | Freq: Every day | ORAL | Status: DC

## 2016-10-07 MED ORDER — REGADENOSON 0.4 MG/5ML IV SOLN
0.4000 mg | Freq: Once | INTRAVENOUS | Status: AC
  Administered 2016-10-07: 0.4 mg via INTRAVENOUS

## 2016-10-07 MED ORDER — MELATONIN 5 MG PO TABS
10.0000 mg | ORAL_TABLET | Freq: Every day | ORAL | Status: DC
  Filled 2016-10-07: qty 2

## 2016-10-07 NOTE — Telephone Encounter (Signed)
Lmov for patient to call back and schedule Hospital fu

## 2016-10-07 NOTE — Progress Notes (Signed)
*  PRELIMINARY RESULTS* Echocardiogram 2D Echocardiogram has been performed.  Sherrie Sport 10/07/2016, 7:52 AM

## 2016-10-07 NOTE — Telephone Encounter (Signed)
-----   Message from Rise Mu, PA-C sent at 10/07/2016  1:10 PM EST ----- Good afternoon:  Can we get this patient set up for hospital follow up with me or TG in 7-10 days. She had an appointment with me 1/23 for chest pain and was admitted to [facility] for rule out.  Thanks!

## 2016-10-07 NOTE — Progress Notes (Signed)
    Discussed stress test and echo results with the patient in detail. Will start low-dose metoprolol 12.5 mg bid. Continue ASA 18 mg daily. She will follow up with our office in 7-10 days to evaluate her symptoms, sooner if needed.

## 2016-10-07 NOTE — Discharge Summary (Signed)
[name] NAME: Jocelyn Hooper    MR#:  [id number]  DATE OF BIRTH:  12/23/1940  DATE OF ADMISSION:  10/06/2016 ADMITTING PHYSICIAN: Demetrios Loll, MD  DATE OF DISCHARGE: No discharge date for patient encounter.  PRIMARY CARE PHYSICIAN: Rusty Aus, MD     ADMISSION DIAGNOSIS:  Unstable Angina  DISCHARGE DIAGNOSIS:  Principal Problem:   Chest pain with moderate risk for cardiac etiology Active Problems:   Hyperlipidemia   Acute renal insufficiency   Thrombocytopenia (HCC)   GERD (gastroesophageal reflux disease)   SECONDARY DIAGNOSIS:   Past Medical History:  Diagnosis Date  . Bursitis of left shoulder   . Chest pain, unspecified   . Chronic kidney disease    KIDNEY STONE CURRENTLY-08-2015-SEES DR COPE  . Complication of anesthesia    PT HAS SEVERE SCOLIOSIS AND FOR TKR ANESTHESIA COULD NOT GET A SPINAL  . CSF leak   . DDD (degenerative disc disease), cervical   . Diverticulosis   . Esophageal reflux   . Fibrocystic breast disease   . GERD (gastroesophageal reflux disease)   . Headache    H/O  . History of colon polyps   . IBS (irritable bowel syndrome)   . Nasal polyps   . Other and unspecified hyperlipidemia   . Scoliosis   . Unspecified sinusitis (chronic)     .pro HOSPITAL COURSE:   The patient is 76 year old Caucasian female with past nuchal history significant for history of gastro-esophageal reflux disease, degenerative disc disease, headaches, hyperlipidemia, normal coronary arteries on cardiac catheterization in 2003 who presented to the hospital with complaints of progressive chest pain for the past 2 weeks. She was admitted to the hospital to rule out MI. Troponins were negative 3 area. The patient underwent transthoracic echocardiogram which revealed normal left ventricular size and contraction with ejection fraction of 0000000, grade 1 diastolic dysfunction, trivial aortic regurgitation, mild  enlargement of ascending aorta, mild mitral regurgitation, right ventricle was normal in size and contraction. Myocardial perfusion stress test that demonstrated septal, mid inferolateral and apical lateral reversible defect, which may represent septal ischemia versus artifact. Right ventricle. Ejection fraction was hyperdynamic. Overall stress test was felt to be low risk. Patient was felt to be stable to be discharged home on metoprolol. Discussion by problem: #1. Atypical chest pain, ruled out of MI, etiology remains unclear, Myoview stress test was low risk according to cardiologist, patient was advised to continue aspirin, metoprolol and follow up with cardiologist as outpatient #2. Hyperlipidemia, continue outpatient management #3. Gastroesophageal reflux disease, continue Protonix, may need to be evaluated by gastroenterologist if symptoms recurrent #4 acute renal insufficiency, BMP needs to be rechecked as outpatient #5. Thrombocytopenia, CBC, needs to be rechecked as outpatient DISCHARGE CONDITIONS:   Stable  CONSULTS OBTAINED:  Treatment Team:  Nelva Bush, MD  DRUG ALLERGIES:   Allergies  Allergen Reactions  . Avelox [Moxifloxacin Hcl In Nacl] Other (See Comments)    Redness all over in about 5 minutes after taking  . Codeine Nausea Only    REACTION: nausea  . Shrimp [Shellfish Allergy]     Hives    . Ciprofloxacin Rash  . Moxifloxacin Hives and Rash    Other reaction(s): OTHER    DISCHARGE MEDICATIONS:   Current Discharge Medication List    START taking these medications   Details  metoprolol tartrate (LOPRESSOR) 25 MG tablet Take 0.5 tablets (12.5 mg total) by mouth 2 (two) times daily. Qty:  60 tablet, Refills: 6    pantoprazole (PROTONIX) 40 MG tablet Take 1 tablet (40 mg total) by mouth daily before breakfast. Qty: 30 tablet, Refills: 6      CONTINUE these medications which have NOT CHANGED   Details  aspirin (ASPIR-81) 81 MG EC tablet Take 81 mg by  mouth daily.      Calcium Carbonate-Vitamin D (CALTRATE 600+D) 600-400 MG-UNIT per tablet Take 1 tablet by mouth 2 (two) times daily.      Docusate Calcium (STOOL SOFTENER PO) Take by mouth daily.    estradiol (ESTRACE) 1 MG tablet Take by mouth.    fluticasone (FLONASE) 50 MCG/ACT nasal spray Place 2 sprays into both nostrils daily.     gabapentin (NEURONTIN) 300 MG capsule Take 300 mg by mouth at bedtime.     loratadine (CLARITIN) 10 MG tablet Take 10 mg by mouth daily.    Melatonin 10 MG TABS Take 10 tablets by mouth at bedtime.    topiramate (TOPAMAX) 100 MG tablet Take 100 mg by mouth at bedtime. 1 1/2 tab    vitamin B-12 (CYANOCOBALAMIN) 1000 MCG tablet Take 1,000 mcg by mouth daily.    vitamin C (ASCORBIC ACID) 500 MG tablet Take 500 mg by mouth 2 (two) times daily.       STOP taking these medications     lansoprazole (PREVACID) 30 MG capsule      meloxicam (MOBIC) 7.5 MG tablet      Lansoprazole (PREVACID PO)      ondansetron (ZOFRAN ODT) 4 MG disintegrating tablet          DISCHARGE INSTRUCTIONS:    The patient is to follow-up with primary care physician and primary cardiologist as outpatient  If you experience worsening of your admission symptoms, develop shortness of breath, life threatening emergency, suicidal or homicidal thoughts you must seek medical attention immediately by calling 911 or calling your MD immediately  if symptoms less severe.  You Must read complete instructions/literature along with all the possible adverse reactions/side effects for all the Medicines you take and that have been prescribed to you. Take any new Medicines after you have completely understood and accept all the possible adverse reactions/side effects.   Please note  You were cared for by a hospitalist during your hospital stay. If you have any questions about your discharge medications or the care you received while you were in the hospital after you are discharged, you  can call the unit and asked to speak with the hospitalist on call if the hospitalist that took care of you is not available. Once you are discharged, your primary care physician will handle any further medical issues. Please note that NO REFILLS for any discharge medications will be authorized once you are discharged, as it is imperative that you return to your primary care physician (or establish a relationship with a primary care physician if you do not have one) for your aftercare needs so that they can reassess your need for medications and monitor your lab values.    Today   CHIEF COMPLAINT:  No chief complaint on file.   HISTORY OF PRESENT ILLNESS:  Jocelyn Hooper  is a 76 y.o. female with a known history of gastro-esophageal reflux disease, degenerative disc disease, headaches, hyperlipidemia, normal coronary arteries on cardiac catheterization in 2003 who presented to the hospital with complaints of progressive chest pain for the past 2 weeks. She was admitted to the hospital to rule out MI. Troponins were negative 3 area.  The patient underwent transthoracic echocardiogram which revealed normal left ventricular size and contraction with ejection fraction of 0000000, grade 1 diastolic dysfunction, trivial aortic regurgitation, mild enlargement of ascending aorta, mild mitral regurgitation, right ventricle was normal in size and contraction. Myocardial perfusion stress test that demonstrated septal, mid inferolateral and apical lateral reversible defect, which may represent septal ischemia versus artifact. Right ventricle. Ejection fraction was hyperdynamic. Overall stress test was felt to be low risk. Patient was felt to be stable to be discharged home on metoprolol. Discussion by problem: #1. Atypical chest pain, ruled out of MI, etiology remains unclear, Myoview stress test was low risk according to cardiologist, patient was advised to continue aspirin, metoprolol and follow up with cardiologist as  outpatient #2. Hyperlipidemia, continue outpatient management #3. Gastroesophageal reflux disease, continue Protonix, may need to be evaluated by gastroenterologist if symptoms recurrent #4 acute renal insufficiency, BMP needs to be rechecked as outpatient #5. Thrombocytopenia, CBC, needs to be rechecked as outpatient   VITAL SIGNS:  Blood pressure 121/67, pulse 70, temperature 98.1 F (36.7 C), temperature source Oral, resp. rate 18, SpO2 97 %.  I/O:    Intake/Output Summary (Last 24 hours) at 10/07/16 1433 Last data filed at 10/07/16 1012  Gross per 24 hour  Intake                0 ml  Output                0 ml  Net                0 ml    PHYSICAL EXAMINATION:  GENERAL:  76 y.o.-year-old patient lying in the bed with no acute distress.  EYES: Pupils equal, round, reactive to light and accommodation. No scleral icterus. Extraocular muscles intact.  HEENT: Head atraumatic, normocephalic. Oropharynx and nasopharynx clear.  NECK:  Supple, no jugular venous distention. No thyroid enlargement, no tenderness.  LUNGS: Normal breath sounds bilaterally, no wheezing, rales,rhonchi or crepitation. No use of accessory muscles of respiration.  CARDIOVASCULAR: S1, S2 normal. No murmurs, rubs, or gallops.  ABDOMEN: Soft, non-tender, non-distended. Bowel sounds present. No organomegaly or mass.  EXTREMITIES: No pedal edema, cyanosis, or clubbing.  NEUROLOGIC: Cranial nerves II through XII are intact. Muscle strength 5/5 in all extremities. Sensation intact. Gait not checked.  PSYCHIATRIC: The patient is alert and oriented x 3.  SKIN: No obvious rash, lesion, or ulcer.   DATA REVIEW:   CBC  Recent Labs Lab 10/06/16 1918  WBC 9.3  HGB 13.3  HCT 39.3  PLT 147*    Chemistries   Recent Labs Lab 10/06/16 1918  NA 136  K 3.9  CL 108  CO2 22  GLUCOSE 141*  BUN 19  CREATININE 1.02*  CALCIUM 9.1    Cardiac Enzymes  Recent Labs Lab 10/07/16 0626  TROPONINI <0.03     Microbiology Results  Results for orders placed or performed in visit on 09/12/12  Urine culture     Status: None   Collection Time: 09/12/12 12:25 PM  Result Value Ref Range Status   Micro Text Report   Final       SOURCE: INDWELLING CATHETER    COMMENT                   NO GROWTH IN 36 HOURS   ANTIBIOTIC  RADIOLOGY:  Nm Myocar Multi W/spect W/wall Motion / Ef  Result Date: 10/07/2016  This is a low risk study.  There is a small in size, mild in severity, reversible defect involving the apical lateral and mid inferolateral segments that may represent subtle ischemia or artifact.  The left ventricular ejection fraction is hyperdynamic (>65%).     EKG:   Orders placed or performed in visit on 10/06/16  . EKG 12-Lead      Management plans discussed with the patient, family and they are in agreement.  CODE STATUS:     Code Status Orders        Start     Ordered   10/06/16 1837  Full code  Continuous     10/06/16 1837    Code Status History    Date Active Date Inactive Code Status Order ID Comments User Context   09/13/2015 10:10 AM 09/13/2015  1:55 PM Full Code [id number]  Dereck Leep, MD Inpatient    Advance Directive Documentation   Flowsheet Row Most Recent Value  Type of Advance Directive  Healthcare Power of Attorney, Living will  Pre-existing out of facility DNR order (yellow form or pink MOST form)  No data  "MOST" Form in Place?  No data      TOTAL TIME TAKING CARE OF THIS PATIENT: 40 minutes.    Theodoro Grist M.D on 10/07/2016 at 2:33 PM  Between 7am to 6pm - Pager - [phone]  After 6pm go to [email or web address] - password [email or web address] [facility]  Office  [phone]  CC: Primary care physician; Rusty Aus, MD

## 2016-10-07 NOTE — Telephone Encounter (Signed)
[facility] ph [facility] for [place] . Scheduled [place] 10/16/16  At 8 am

## 2016-10-07 NOTE — Care Management Obs Status (Signed)
[facility] NOTIFICATION   Patient Details  Name: Jocelyn Hooper MRN: [id number] Date of Birth: 06/07/1941   Medicare Observation Status Notification Given:  Yes    Katrina Stack, RN 10/07/2016, 3:14 PM

## 2016-10-07 NOTE — Consult Note (Signed)
Cardiology Consultation Note  Patient ID: Jocelyn Hooper, MRN: [id number], DOB/AGE: 1940-11-22 76 y.o. Admit date: 10/06/2016   Date of Consult: 10/07/2016 Primary Physician: Rusty Aus, MD Primary Cardiologist: Dr. Rockey Situ, MD Requesting Physician: Dr. Bridgett Larsson, MD  Chief Complaint: Chest pain Reason for Consult: Chest pain  HPI: 76 y.o. female with h/o normal coronary arteries by cardiac cath in 2003, HLD, DDD, GERD, IBS, chest pain/SOB, and strong family history of CAD with her mother and a brother having an MI at age 102 who presented to clinic on 1/23 requesting to be seen for chest pain.   She waslast seen in 2015 for evaluation of LE swelling, tingling, and SOB with exertion. She undewent stress echo that possible hypokinesis of the lateral wall with normal EF. Recommendations included exercise regimen vs cardiac cath if her symptoms persisted. She was lost to follow up.   She originally had an appoinement scheduled with Dr. Rockey Situ for 10/09/16 to discuss some issues with chest pain. She called the office on 1/23 stating she would like to be seen today. In the office she tells me for the past 2 days she has had exertional substernal chest heaviness that radiates to her left shoulder and down her left arm. Pain is 7/10. Pain is worse with exertion and will improve with rest. Pain last 4-5 minutes after rest and will persist as long as she is exerting herself. There is associated diaphoresis. No associated nausea, vomiting, palpitations, dizziness, presyncope, or syncope. She has had two episodes of this pain today. First episode was this morning associated with ambulation and the 2nd episode was around 1 PM while walking down the [place] hallway. She does not actively have chest pain at this time in the office. She was directly admitted to [facility] for chest pain observation and rule out.  Since being admitted she has not had any further chest pain. She has ruled out. Labs showed an LDL of 105, HDL  75, wbc 9.3, hgb 13.3, plt 147, A1C pending. She is for [facility] this morning.     Past Medical History:  Diagnosis Date  . Bursitis of left shoulder   . Chest pain, unspecified   . Chronic kidney disease    KIDNEY STONE CURRENTLY-08-2015-SEES DR COPE  . Complication of anesthesia    PT HAS SEVERE SCOLIOSIS AND FOR TKR ANESTHESIA COULD NOT GET A SPINAL  . CSF leak   . DDD (degenerative disc disease), cervical   . Diverticulosis   . Esophageal reflux   . Fibrocystic breast disease   . GERD (gastroesophageal reflux disease)   . Headache    H/O  . History of colon polyps   . IBS (irritable bowel syndrome)   . Nasal polyps   . Other and unspecified hyperlipidemia   . Scoliosis   . Unspecified sinusitis (chronic)       Most Recent Cardiac Studies: As above   Surgical History:  Past Surgical History:  Procedure Laterality Date  . ABDOMINAL HYSTERECTOMY    . APPENDECTOMY    . [place]  . [place]     left   . CATARACT EXTRACTION     right  . CHOLECYSTECTOMY  2002  . hysterectomy (other)  1974  . JOINT REPLACEMENT     LEFT knee  . KIDNEY STONE SURGERY    . KNEE ARTHROSCOPY Right 09/13/2015   Procedure: ARTHROSCOPY KNEE, PARTIAL LATERAL  MENISECTOMY, CHONDROPLASTY ;  Surgeon: Dereck Leep, MD;  Location: [facility] ORS;  Service: Orthopedics;  Laterality: Right;  . KNEE SURGERY  2004   having a lot of problems with left knee  . [place], 2003, 2012  . sinus procedures  1970,1990,1994,2005  . TONSILLECTOMY       Home Meds: Prior to Admission medications   Medication Sig Start Date End Date Taking? Authorizing Provider  aspirin (ASPIR-81) 81 MG EC tablet Take 81 mg by mouth daily.     Yes Historical Provider, MD  Calcium Carbonate-Vitamin D (CALTRATE 600+D) 600-400 MG-UNIT per tablet Take 1 tablet by mouth 2 (two) times daily.     Yes Historical Provider, MD  Docusate  Calcium (STOOL SOFTENER PO) Take by mouth daily.   Yes Historical Provider, MD  estradiol (ESTRACE) 1 MG tablet Take by mouth. 04/28/16 04/28/17 Yes Historical Provider, MD  fluticasone (FLONASE) 50 MCG/ACT nasal spray Place 2 sprays into both nostrils daily.  01/09/14  Yes Historical Provider, MD  gabapentin (NEURONTIN) 300 MG capsule Take 300 mg by mouth at bedtime.    Yes Historical Provider, MD  lansoprazole (PREVACID) 30 MG capsule Take 30 mg by mouth at bedtime.    Yes Historical Provider, MD  loratadine (CLARITIN) 10 MG tablet Take 10 mg by mouth daily.   Yes Historical Provider, MD  Melatonin 10 MG TABS Take 10 tablets by mouth at bedtime.   Yes Historical Provider, MD  meloxicam (MOBIC) 7.5 MG tablet Take 7.5 mg by mouth daily.   Yes Historical Provider, MD  topiramate (TOPAMAX) 100 MG tablet Take 100 mg by mouth at bedtime. 1 1/2 tab   Yes Historical Provider, MD  vitamin B-12 (CYANOCOBALAMIN) 1000 MCG tablet Take 1,000 mcg by mouth daily.   Yes Historical Provider, MD  vitamin C (ASCORBIC ACID) 500 MG tablet Take 500 mg by mouth 2 (two) times daily.    Yes Historical Provider, MD    Inpatient Medications:  . aspirin EC  325 mg Oral Daily  . atorvastatin  40 mg Oral q1800  . docusate sodium  100 mg Oral Daily  . enoxaparin (LOVENOX) injection  40 mg Subcutaneous Q24H  . fluticasone  2 spray Each Nare Daily  . gabapentin  300 mg Oral QHS  . loratadine  10 mg Oral Daily  . Melatonin  10 tablet Oral QHS  . pantoprazole  40 mg Oral QAC breakfast  . topiramate  150 mg Oral QHS     Allergies:  Allergies  Allergen Reactions  . Avelox [Moxifloxacin Hcl In Nacl] Other (See Comments)    Redness all over in about 5 minutes after taking  . Codeine Nausea Only    REACTION: nausea  . Shrimp [Shellfish Allergy]     Hives    . Ciprofloxacin Rash  . Moxifloxacin Hives and Rash    Other reaction(s): OTHER    Social History   Social History  . Marital status: Married    Spouse name:  N/A  . Number of children: N/A  . Years of education: N/A   Occupational History  . Retired Retired   Social History Main Topics  . Smoking status: Never Smoker  . Smokeless tobacco: Never Used  . Alcohol use No  . Drug use: No  . Sexual activity: Not on file   Other Topics Concern  . Not on file   Social History Narrative   Retired 1997. Married.  Family History  Problem Relation Age of Onset  . Heart attack Mother 62  . Heart failure Mother   . Heart attack Father   . Heart disease Father     CABG  . Heart attack Brother 73    premature     Review of Systems: Review of Systems  Constitutional: Positive for diaphoresis and malaise/fatigue. Negative for chills, fever and weight loss.  HENT: Negative for congestion.   Eyes: Negative for discharge and redness.  Respiratory: Negative for cough, hemoptysis, sputum production, shortness of breath and wheezing.   Cardiovascular: Positive for chest pain. Negative for palpitations, orthopnea, claudication, leg swelling and PND.  Gastrointestinal: Negative for abdominal pain, blood in stool, heartburn, melena, nausea and vomiting.  Genitourinary: Negative for hematuria.  Musculoskeletal: Negative for falls and myalgias.  Skin: Negative for rash.  Neurological: Positive for weakness. Negative for dizziness, tingling, tremors, sensory change, speech change, focal weakness and loss of consciousness.  Endo/Heme/Allergies: Does not bruise/bleed easily.  Psychiatric/Behavioral: Negative for substance abuse. The patient is not nervous/anxious.   All other systems reviewed and are negative.   Labs:  Recent Labs  10/06/16 1918 10/07/16 0034 10/07/16 0626  TROPONINI <0.03 <0.03 <0.03   Lab Results  Component Value Date   WBC 9.3 10/06/2016   HGB 13.3 10/06/2016   HCT 39.3 10/06/2016   MCV 94.3 10/06/2016   PLT 147 (L) 10/06/2016    Recent Labs Lab 10/06/16 1918  NA 136  K 3.9  CL 108  CO2 22  BUN 19    CREATININE 1.02*  CALCIUM 9.1  GLUCOSE 141*   Lab Results  Component Value Date   CHOL 198 10/07/2016   HDL 75 10/07/2016   LDLCALC 105 (H) 10/07/2016   TRIG 88 10/07/2016   No results found for: DDIMER  Radiology/Studies:  No results found.  EKG: Interpreted by me showed: NSR, 79 bpm, low voltage QRS, poor R wave progression, nonspecific inferolateral st/t changes  Telemetry: Interpreted by me showed: sinus rhythm  Weights: Wt Readings from Last 3 Encounters:  10/06/16 156 lb 4 oz (70.9 kg)  09/13/15 154 lb (69.9 kg)  06/12/15 156 lb (70.8 kg)     Physical Exam: Blood pressure (!) 106/55, pulse 69, temperature 97.7 F (36.5 C), temperature source Oral, resp. rate 16, SpO2 96 %. There is no height or weight on file to calculate BMI. General: Well developed, well nourished, in no acute distress. Head: Normocephalic, atraumatic, sclera non-icteric, no xanthomas, nares are without discharge.  Neck: Negative for carotid bruits. JVD not elevated. Lungs: Clear bilaterally to auscultation without wheezes, rales, or rhonchi. Breathing is unlabored. Heart: RRR with S1 S2. No murmurs, rubs, or gallops appreciated. Abdomen: Soft, non-tender, non-distended with normoactive bowel sounds. No hepatomegaly. No rebound/guarding. No obvious abdominal masses. Msk:  Strength and tone appear normal for age. Extremities: No clubbing or cyanosis. No edema. Distal pedal pulses are 2+ and equal bilaterally. Neuro: Alert and oriented X 3. No facial asymmetry. No focal deficit. Moves all extremities spontaneously. Psych:  Responds to questions appropriately with a normal affect.    Assessment and Plan:  Principal Problem:   Unstable angina (HCC) Active Problems:   Chest pain with moderate risk for cardiac etiology   Hyperlipidemia    1. Chest pain with moderate risk of cardiac etiology: -Currently chest pain free -Lexiscan Myoview this morning, await results -If stress test is normal,  should be ok to discharge home with outpatient follow up -Continue ASA  2.  HLD: -Stable -PCP has deferred statin therapy 2/2 HDL -A1C pending for further risk stratification    Signed, Christell Faith, PA-C [facility] Pager: [phone] 10/07/2016, 9:47 AM

## 2016-10-08 LAB — HEMOGLOBIN A1C
HEMOGLOBIN A1C: 5.1 % (ref 4.8–5.6)
Hgb A1c MFr Bld: 6.2 % — ABNORMAL HIGH (ref 4.8–5.6)
MEAN PLASMA GLUCOSE: 131 mg/dL
MEAN PLASMA GLUCOSE: 100 mg/dL

## 2016-10-09 ENCOUNTER — Ambulatory Visit: Payer: PPO | Admitting: Cardiovascular Disease

## 2016-10-15 DIAGNOSIS — K2100 Gastro-esophageal reflux disease with esophagitis, without bleeding: Secondary | ICD-10-CM | POA: Insufficient documentation

## 2016-10-15 DIAGNOSIS — I2510 Atherosclerotic heart disease of native coronary artery without angina pectoris: Secondary | ICD-10-CM | POA: Insufficient documentation

## 2016-10-16 ENCOUNTER — Ambulatory Visit (INDEPENDENT_AMBULATORY_CARE_PROVIDER_SITE_OTHER): Payer: PPO | Admitting: Cardiovascular Disease

## 2016-10-16 ENCOUNTER — Encounter: Payer: Self-pay | Admitting: Cardiovascular Disease

## 2016-10-16 VITALS — BP 120/70 | HR 61 | Ht 65.5 in | Wt 154.5 lb

## 2016-10-16 DIAGNOSIS — R600 Localized edema: Secondary | ICD-10-CM | POA: Diagnosis not present

## 2016-10-16 DIAGNOSIS — Z8249 Family history of ischemic heart disease and other diseases of the circulatory system: Secondary | ICD-10-CM | POA: Diagnosis not present

## 2016-10-16 DIAGNOSIS — R0602 Shortness of breath: Secondary | ICD-10-CM | POA: Diagnosis not present

## 2016-10-16 DIAGNOSIS — E782 Mixed hyperlipidemia: Secondary | ICD-10-CM | POA: Diagnosis not present

## 2016-10-16 DIAGNOSIS — R079 Chest pain, unspecified: Secondary | ICD-10-CM

## 2016-10-16 NOTE — Patient Instructions (Addendum)
Medication Instructions:   No medication changes made  Labwork:  No new labs needed  Testing/Procedures:  No further testing at this time  Research CT coronary calcium score (looks for blockages) Please call [phone] if you would like to schedule There is a one-time fee of $150 due at the time of your procedure [address] recommend watching educational videos on topics of interest to you at:       [email or web address]  Enter code: HEARTCARE    Follow-Up: It was a pleasure seeing you in the office today. Please call us if you have new issues that need to be addressed before your next appt.  [phone]  Your physician wants you to follow-up in: 12 months.  You will receive a reminder letter in the mail two months in advance. If you don't receive a letter, please call our office to schedule the follow-up appointment.  If you need a refill on your cardiac medications before your next appointment, please call your pharmacy.    Coronary Calcium Scan A coronary calcium scan is an imaging test used to look for deposits of calcium and other fatty materials (plaques) in the inner lining of the blood vessels of your heart (coronary arteries). These deposits of calcium and plaques can partly clog and narrow the coronary arteries without producing any symptoms or warning signs. This puts you at risk for a heart attack. This test can detect these deposits before symptoms develop.  LET [facility] CARE PROVIDER KNOW ABOUT:  Any allergies you have.  All medicines you are taking, including vitamins, herbs, eye drops, creams, and over-the-counter medicines.  Previous problems you or members of your family have had with the use of anesthetics.  Any blood disorders you have.  Previous surgeries you have had.  Medical conditions you have.  Possibility of pregnancy, if this applies. RISKS AND COMPLICATIONS Generally, this is a safe procedure. However, as  with any procedure, complications can occur. This test involves the use of radiation. Radiation exposure can be dangerous to a pregnant woman and her unborn baby. If you are pregnant, you should not have this procedure done.  BEFORE THE PROCEDURE There is no special preparation for the procedure. PROCEDURE  You will need to undress and put on a hospital gown. You will need to remove any jewelry around your neck or chest.  Sticky electrodes are placed on your chest and are connected to an electrocardiogram (EKG or electrocardiography) machine to recorda tracing of the electrical activity of your heart.  A CT scanner will take pictures of your heart. During this time, you will be asked to lie still and hold your breath for 2-3 seconds while a picture is being taken of your heart. AFTER THE PROCEDURE   You will be allowed to get dressed.  You can return to your normal activities after the scan is done. This information is not intended to replace advice given to you by your health care provider. Make sure you discuss any questions you have with your health care provider. Document Released: 02/27/2008 Document Revised: 09/05/2013 Document Reviewed: 05/08/2013 Elsevier Interactive Patient Education  2017 [employer].

## 2016-10-16 NOTE — Progress Notes (Signed)
Cardiology Office Note  Date:  10/16/2016   ID:  TEARRIA MCDANIEL, DOB Apr 21, 1941, MRN [id number]  PCP:  Rusty Aus, MD   Chief Complaint  Patient presents with  . other     7 day hospital follow up for chest pain [facility] for [place]  Pt states she has been symptom free since leaving hospital. Reviewed meds with pt verbally.    HPI:  Jocelyn Hooper is a 76 year old woman with hyperlipidemia, degenerative disc disease, GERD, IBS, history of chest pain with normal cardiac catheterization may 2003 at [facility], strong family history of coronary artery disease, previously seen in our clinic for leg swelling, leg tingling previously seen for shortness of breath, who presents for follow-up after recent hospitalization for chest pain  Admitted to the hospital 1 week ago for chest pain She reports that she woke up in the middle of the night with mediastinal discomfort lasting for 15 minutes. She went to the emergency room Cardiac enzymes were negative 3 Echocardiogram performed showing normal LV function greater than 55%, mildly enlarged ascending aorta  Stress test showed low risk scan, Septal, mid inferolateral and apical lateral reversible defect, possible artifact Results reviewed with her in detail  No further episodes since her discharge, has been active, no shortness of breath no chest pain She continues to be very nervous about the abnormal stress test finding, the potential for blockage  Lab work reviewed with her total cholesterol 198, LDL around 100  EKG shows normal sinus rhythm with rate 61 beats per minute, no significant ST or T wave changes    PMH:   has a past medical history of Bursitis of left shoulder; Chest pain, unspecified; Chronic kidney disease; Complication of anesthesia; CSF leak; DDD (degenerative disc disease), cervical; Diverticulosis; Esophageal reflux; Fibrocystic breast disease; GERD (gastroesophageal reflux disease); Headache; History of colon polyps; IBS (irritable  bowel syndrome); Nasal polyps; Other and unspecified hyperlipidemia; Scoliosis; and Unspecified sinusitis (chronic).  PSH:    Past Surgical History:  Procedure Laterality Date  . ABDOMINAL HYSTERECTOMY    . APPENDECTOMY    . [place]  . [place]     left   . CATARACT EXTRACTION     right  . CHOLECYSTECTOMY  2002  . hysterectomy (other)  1974  . JOINT REPLACEMENT     LEFT knee  . KIDNEY STONE SURGERY    . KNEE ARTHROSCOPY Right 09/13/2015   Procedure: ARTHROSCOPY KNEE, PARTIAL LATERAL MENISECTOMY, CHONDROPLASTY ;  Surgeon: Dereck Leep, MD;  Location: [facility] ORS;  Service: Orthopedics;  Laterality: Right;  . KNEE SURGERY  2004   having a lot of problems with left knee  . [place], 2003, 2012  . sinus procedures  1970,1990,1994,2005  . TONSILLECTOMY      Current Outpatient Prescriptions  Medication Sig Dispense Refill  . aspirin (ASPIR-81) 81 MG EC tablet Take 81 mg by mouth daily.      . Calcium Carbonate-Vitamin D (CALTRATE 600+D) 600-400 MG-UNIT per tablet Take 1 tablet by mouth 2 (two) times daily.      Jocelyn Hooper Calcium (STOOL SOFTENER PO) Take by mouth daily.    Marland Kitchen estradiol (ESTRACE) 1 MG tablet Take by mouth.    . fluticasone (FLONASE) 50 MCG/ACT nasal spray Place 2 sprays into both nostrils daily.     Marland Kitchen gabapentin (NEURONTIN) 300 MG capsule Take 300 mg by  mouth at bedtime.     . lansoprazole (PREVACID) 30 MG capsule Take 30 mg by mouth daily at 12 noon.    . loratadine (CLARITIN) 10 MG tablet Take 10 mg by mouth daily.    . Melatonin 10 MG TABS Take 10 tablets by mouth at bedtime.    . metoprolol tartrate (LOPRESSOR) 25 MG tablet Take 0.5 tablets (12.5 mg total) by mouth 2 (two) times daily. 60 tablet 6  . topiramate (TOPAMAX) 100 MG tablet Take 100 mg by mouth at bedtime. 1 1/2 tab    . vitamin B-12 (CYANOCOBALAMIN) 1000 MCG tablet Take 1,000 mcg by mouth daily.    .  vitamin C (ASCORBIC ACID) 500 MG tablet Take 500 mg by mouth 2 (two) times daily.      No current facility-administered medications for this visit.      Allergies:   Avelox [moxifloxacin hcl in nacl]; Codeine; Shrimp [shellfish allergy]; Ciprofloxacin; and Moxifloxacin   Social History:  The patient  reports that she has never smoked. She has never used smokeless tobacco. She reports that she does not drink alcohol or use drugs.   Family History:   family history includes Heart attack in her father; Heart attack (age of onset: 44) in her brother; Heart attack (age of onset: 39) in her mother; Heart disease in her father; Heart failure in her mother.    Review of Systems: Review of Systems  Constitutional: Negative.   Respiratory: Negative.   Cardiovascular: Positive for chest pain.  Gastrointestinal: Negative.   Musculoskeletal: Negative.   Neurological: Negative.   Psychiatric/Behavioral: The patient is nervous/anxious.   All other systems reviewed and are negative.    PHYSICAL EXAM: VS:  BP 120/70 (BP Location: Left Arm, Patient Position: Sitting, Cuff Size: Normal)   Pulse 61   Ht 5' 5.5" (1.664 m)   Wt 154 lb 8 oz (70.1 kg)   BMI 25.32 kg/m  , BMI Body mass index is 25.32 kg/m. GEN: Well nourished, well developed, in no acute distress  HEENT: normal  Neck: no JVD, carotid bruits, or masses Cardiac: RRR; no murmurs, rubs, or gallops,no edema  Respiratory:  clear to auscultation bilaterally, normal work of breathing GI: soft, nontender, nondistended, + BS MS: no deformity or atrophy  Skin: warm and dry, no rash Neuro:  Strength and sensation are intact Psych: euthymic mood, full affect    Recent Labs: 10/06/2016: BUN 19; Creatinine, Ser 1.02; Hemoglobin 13.3; Platelets 147; Potassium 3.9; Sodium 136    Lipid Panel Lab Results  Component Value Date   CHOL 198 10/07/2016   HDL 75 10/07/2016   LDLCALC 105 (H) 10/07/2016   TRIG 88 10/07/2016      Wt Readings  from Last 3 Encounters:  10/16/16 154 lb 8 oz (70.1 kg)  10/06/16 156 lb 4 oz (70.9 kg)  09/13/15 154 lb (69.9 kg)       ASSESSMENT AND PLAN:  Mixed hyperlipidemia - Plan: EKG 12-Lead, CT CARDIAC SCORING Talked about her cholesterol with her, currently 200, not on a statin  Localized edema - Plan: EKG 12-Lead, CT CARDIAC SCORING Stable minimal ankle swelling  Exertional shortness of breath - Plan: EKG 12-Lead, CT CARDIAC SCORING Reports symptoms are stable Recent normal echocardiogram and stress test  Family history of premature CAD - Plan: EKG 12-Lead, CT CARDIAC SCORING  Chest pain with moderate risk for cardiac etiology Hospital records reviewed with her in detail She continues to be very anxious about recent episode of chest  pain Subtle abnormality on recent stress test,   was told she may have had an old heart attack My suspicion is this is artifact As she is concerned, talked about other modalities to visualize her heart and risk stratify  we'll schedule her for CT coronary calcium score. Suspect her chest pain was GI in nature, possibly gas Recommended she take carbonate, Gas-X for recurrent symptoms  Disposition:   F/U  12 months   Total encounter time more than 25 minutes  Greater than 50% was spent in counseling and coordination of care with the patient    Orders Placed This Encounter  Procedures  . CT CARDIAC SCORING  . EKG 12-Lead     Signed, Jocelyn Hooper, M.D., Ph.D. 10/16/2016  [place], [place]

## 2016-10-20 DIAGNOSIS — J328 Other chronic sinusitis: Secondary | ICD-10-CM | POA: Diagnosis not present

## 2016-10-20 DIAGNOSIS — R51 Headache: Secondary | ICD-10-CM | POA: Diagnosis not present

## 2016-10-20 DIAGNOSIS — L570 Actinic keratosis: Secondary | ICD-10-CM | POA: Diagnosis not present

## 2016-11-02 ENCOUNTER — Ambulatory Visit (INDEPENDENT_AMBULATORY_CARE_PROVIDER_SITE_OTHER)
Admission: RE | Admit: 2016-11-02 | Discharge: 2016-11-02 | Disposition: A | Discharge status: Home / Self Care | Payer: Self-pay | Source: Ambulatory Visit | Attending: Cardiovascular Disease | Admitting: Cardiovascular Disease

## 2016-11-02 DIAGNOSIS — R0602 Shortness of breath: Secondary | ICD-10-CM

## 2016-11-02 DIAGNOSIS — E782 Mixed hyperlipidemia: Secondary | ICD-10-CM

## 2016-11-02 DIAGNOSIS — R600 Localized edema: Secondary | ICD-10-CM

## 2016-11-02 DIAGNOSIS — Z8249 Family history of ischemic heart disease and other diseases of the circulatory system: Secondary | ICD-10-CM

## 2016-11-06 ENCOUNTER — Telehealth: Payer: Self-pay | Admitting: Cardiovascular Disease

## 2016-11-06 NOTE — Telephone Encounter (Signed)
They have not resulted to me yet.

## 2016-11-06 NOTE — Telephone Encounter (Signed)
Pt would like her calcium scan results.

## 2016-11-18 DIAGNOSIS — M25512 Pain in left shoulder: Secondary | ICD-10-CM | POA: Diagnosis not present

## 2016-11-18 DIAGNOSIS — M7542 Impingement syndrome of left shoulder: Secondary | ICD-10-CM | POA: Diagnosis not present

## 2016-11-19 ENCOUNTER — Other Ambulatory Visit: Payer: Self-pay

## 2016-11-19 MED ORDER — ROSUVASTATIN CALCIUM 5 MG PO TABS: 5.0000 mg | ORAL_TABLET | Freq: Every day | ORAL | 3 refills | Status: DC

## 2016-12-14 DIAGNOSIS — Z0000 Encounter for general adult medical examination without abnormal findings: Secondary | ICD-10-CM | POA: Diagnosis not present

## 2016-12-14 DIAGNOSIS — E538 Deficiency of other specified B group vitamins: Secondary | ICD-10-CM | POA: Diagnosis not present

## 2016-12-21 DIAGNOSIS — Z23 Encounter for immunization: Secondary | ICD-10-CM | POA: Diagnosis not present

## 2016-12-21 DIAGNOSIS — E538 Deficiency of other specified B group vitamins: Secondary | ICD-10-CM | POA: Diagnosis not present

## 2016-12-21 DIAGNOSIS — Z0000 Encounter for general adult medical examination without abnormal findings: Secondary | ICD-10-CM | POA: Diagnosis not present

## 2016-12-21 DIAGNOSIS — M5136 Other intervertebral disc degeneration, lumbar region: Secondary | ICD-10-CM | POA: Diagnosis not present

## 2016-12-21 DIAGNOSIS — Z79899 Other long term (current) drug therapy: Secondary | ICD-10-CM | POA: Diagnosis not present

## 2016-12-25 DIAGNOSIS — Z86010 Personal history of colonic polyps: Secondary | ICD-10-CM | POA: Diagnosis not present

## 2016-12-25 DIAGNOSIS — R194 Change in bowel habit: Secondary | ICD-10-CM | POA: Diagnosis not present

## 2017-01-06 DIAGNOSIS — L570 Actinic keratosis: Secondary | ICD-10-CM | POA: Diagnosis not present

## 2017-01-06 DIAGNOSIS — L821 Other seborrheic keratosis: Secondary | ICD-10-CM | POA: Diagnosis not present

## 2017-01-08 DIAGNOSIS — R194 Change in bowel habit: Secondary | ICD-10-CM | POA: Diagnosis not present

## 2017-01-15 DIAGNOSIS — L578 Other skin changes due to chronic exposure to nonionizing radiation: Secondary | ICD-10-CM | POA: Diagnosis not present

## 2017-01-15 DIAGNOSIS — L989 Disorder of the skin and subcutaneous tissue, unspecified: Secondary | ICD-10-CM | POA: Diagnosis not present

## 2017-01-15 DIAGNOSIS — Z9889 Other specified postprocedural states: Secondary | ICD-10-CM | POA: Diagnosis not present

## 2017-01-15 DIAGNOSIS — J328 Other chronic sinusitis: Secondary | ICD-10-CM | POA: Diagnosis not present

## 2017-01-15 DIAGNOSIS — R51 Headache: Secondary | ICD-10-CM | POA: Diagnosis not present

## 2017-03-10 ENCOUNTER — Encounter
Admission: RE | Disposition: A | Discharge status: Home / Self Care | Payer: Self-pay | Source: Ambulatory Visit | Attending: Unknown Physician Specialty

## 2017-03-10 ENCOUNTER — Ambulatory Visit: Payer: PPO | Admitting: Anesthesiology

## 2017-03-10 ENCOUNTER — Ambulatory Visit
Admission: RE | Admit: 2017-03-10 | Discharge: 2017-03-10 | Disposition: A | Discharge status: Home / Self Care | Payer: PPO | Source: Ambulatory Visit | Attending: Unknown Physician Specialty | Admitting: Unknown Physician Specialty

## 2017-03-10 ENCOUNTER — Encounter: Payer: Self-pay | Admitting: Anesthesiology

## 2017-03-10 DIAGNOSIS — Z7951 Long term (current) use of inhaled steroids: Secondary | ICD-10-CM | POA: Diagnosis not present

## 2017-03-10 DIAGNOSIS — I2510 Atherosclerotic heart disease of native coronary artery without angina pectoris: Secondary | ICD-10-CM | POA: Insufficient documentation

## 2017-03-10 DIAGNOSIS — K648 Other hemorrhoids: Secondary | ICD-10-CM | POA: Diagnosis not present

## 2017-03-10 DIAGNOSIS — K640 First degree hemorrhoids: Secondary | ICD-10-CM | POA: Insufficient documentation

## 2017-03-10 DIAGNOSIS — Z7982 Long term (current) use of aspirin: Secondary | ICD-10-CM | POA: Diagnosis not present

## 2017-03-10 DIAGNOSIS — Z79899 Other long term (current) drug therapy: Secondary | ICD-10-CM | POA: Diagnosis not present

## 2017-03-10 DIAGNOSIS — K5730 Diverticulosis of large intestine without perforation or abscess without bleeding: Secondary | ICD-10-CM | POA: Diagnosis not present

## 2017-03-10 DIAGNOSIS — Z86010 Personal history of colonic polyps: Secondary | ICD-10-CM | POA: Insufficient documentation

## 2017-03-10 DIAGNOSIS — N189 Chronic kidney disease, unspecified: Secondary | ICD-10-CM | POA: Diagnosis not present

## 2017-03-10 DIAGNOSIS — R194 Change in bowel habit: Secondary | ICD-10-CM | POA: Diagnosis not present

## 2017-03-10 DIAGNOSIS — K5790 Diverticulosis of intestine, part unspecified, without perforation or abscess without bleeding: Secondary | ICD-10-CM | POA: Diagnosis not present

## 2017-03-10 DIAGNOSIS — Z79890 Hormone replacement therapy (postmenopausal): Secondary | ICD-10-CM | POA: Insufficient documentation

## 2017-03-10 HISTORY — PX: COLONOSCOPY WITH PROPOFOL: SHX5780

## 2017-03-10 HISTORY — DX: Trigeminal neuralgia: G50.0

## 2017-03-10 HISTORY — DX: Deficiency of other specified B group vitamins: E53.8

## 2017-03-10 HISTORY — DX: Atherosclerotic heart disease of native coronary artery without angina pectoris: I25.10

## 2017-03-10 SURGERY — COLONOSCOPY WITH PROPOFOL
Anesthesia: General

## 2017-03-10 MED ORDER — SODIUM CHLORIDE 0.9 % IV SOLN: INTRAVENOUS | Status: DC

## 2017-03-10 MED ORDER — PROPOFOL 500 MG/50ML IV EMUL
INTRAVENOUS | Status: AC
  Filled 2017-03-10: qty 50

## 2017-03-10 MED ORDER — MIDAZOLAM HCL 2 MG/2ML IJ SOLN
INTRAMUSCULAR | Status: DC | PRN
  Administered 2017-03-10: 2 mg via INTRAVENOUS

## 2017-03-10 MED ORDER — PROPOFOL 500 MG/50ML IV EMUL
INTRAVENOUS | Status: DC | PRN
  Administered 2017-03-10: 120 ug/kg/min via INTRAVENOUS

## 2017-03-10 MED ORDER — PIPERACILLIN-TAZOBACTAM 3.375 G IVPB 30 MIN
3.3750 g | Freq: Once | INTRAVENOUS | Status: AC
  Administered 2017-03-10: 3.375 g via INTRAVENOUS
  Filled 2017-03-10: qty 50

## 2017-03-10 MED ORDER — SODIUM CHLORIDE 0.9 % IV SOLN
INTRAVENOUS | Status: DC
  Administered 2017-03-10: 1000 mL via INTRAVENOUS

## 2017-03-10 MED ORDER — MIDAZOLAM HCL 2 MG/2ML IJ SOLN
INTRAMUSCULAR | Status: AC
  Filled 2017-03-10: qty 2

## 2017-03-10 MED ORDER — FENTANYL CITRATE (PF) 100 MCG/2ML IJ SOLN
INTRAMUSCULAR | Status: DC | PRN
  Administered 2017-03-10: 50 ug via INTRAVENOUS

## 2017-03-10 MED ORDER — FENTANYL CITRATE (PF) 100 MCG/2ML IJ SOLN
INTRAMUSCULAR | Status: AC
  Filled 2017-03-10: qty 2

## 2017-03-10 NOTE — Anesthesia Post-op Follow-up Note (Cosign Needed)
Anesthesia QCDR form completed.        

## 2017-03-10 NOTE — Transfer of Care (Signed)
Immediate Anesthesia Transfer of Care Note  Patient: Jocelyn Hooper  Procedure(s) Performed: Procedure(s): COLONOSCOPY WITH PROPOFOL (N/A)  Patient Location: PACU  Anesthesia Type:General  Level of Consciousness: awake and sedated  Airway & Oxygen Therapy: Patient Spontanous Breathing and Patient connected to nasal cannula oxygen  Post-op Assessment: Report given to RN and Post -op Vital signs reviewed and stable  Post vital signs: Reviewed and stable  Last Vitals:  Vitals:   03/10/17 1030  BP: 130/66  Pulse: (!) 58  Resp: 18  Temp: (!) 36 C    Last Pain:  Vitals:   03/10/17 1030  TempSrc: Tympanic         Complications: No apparent anesthesia complications

## 2017-03-10 NOTE — H&P (Signed)
Primary Care Physician:  Rusty Aus, MD Primary Gastroenterologist:  Dr. Vira Agar  Pre-Procedure History & Physical: HPI:  Jocelyn Hooper is a 76 y.o. female is here for an colonoscopy.   Past Medical History:  Diagnosis Date  . Bursitis of left shoulder   . Chest pain, unspecified   . Chronic kidney disease    KIDNEY STONE CURRENTLY-08-2015-SEES DR COPE  . Complication of anesthesia    PT HAS SEVERE SCOLIOSIS AND FOR TKR ANESTHESIA COULD NOT GET A SPINAL  . Coronary artery disease    ascvd  . CSF leak   . DDD (degenerative disc disease), cervical   . Diverticulosis   . Esophageal reflux   . Fibrocystic breast disease   . GERD (gastroesophageal reflux disease)   . Headache    H/O  . History of colon polyps   . IBS (irritable bowel syndrome)   . Nasal polyps   . Other and unspecified hyperlipidemia   . Scoliosis   . Trigeminal neuralgia syndrome   . Unspecified sinusitis (chronic)   . Vitamin B12 deficiency     Past Surgical History:  Procedure Laterality Date  . ABDOMINAL HYSTERECTOMY    . APPENDECTOMY    . [place]  . [place]     left   . CATARACT EXTRACTION     right  . CHOLECYSTECTOMY  2002  . hysterectomy (other)  1974  . JOINT REPLACEMENT     LEFT knee  . KIDNEY STONE SURGERY    . KNEE ARTHROSCOPY Right 09/13/2015   Procedure: ARTHROSCOPY KNEE, PARTIAL LATERAL MENISECTOMY, CHONDROPLASTY ;  Surgeon: Dereck Leep, MD;  Location: [facility] ORS;  Service: Orthopedics;  Laterality: Right;  . KNEE SURGERY  2004   having a lot of problems with left knee  . [place], 2003, 2012  . sinus procedures  1970,1990,1994,2005  . TONSILLECTOMY      Prior to Admission medications   Medication Sig Start Date End Date Taking? Authorizing Provider  Amoxicillin (AMOXIL PO) Take 2,000 mg by mouth as needed.   Yes [provider]  diclofenac sodium (VOLTAREN) 1 % GEL  Apply 2 g topically 3 (three) times daily.   Yes [provider]  metoprolol tartrate (LOPRESSOR) 25 MG tablet Take 0.5 tablets (12.5 mg total) by mouth 2 (two) times daily. 10/07/16  Yes Theodoro Grist, MD  aspirin (ASPIR-81) 81 MG EC tablet Take 81 mg by mouth daily.      [provider]  Calcium Carbonate-Vitamin D (CALTRATE 600+D) 600-400 MG-UNIT per tablet Take 1 tablet by mouth 2 (two) times daily.      [provider]  Docusate Calcium (STOOL SOFTENER PO) Take by mouth daily.    [provider]  estradiol (ESTRACE) 1 MG tablet Take by mouth. 04/28/16 04/28/17  [provider]  fluticasone (FLONASE) 50 MCG/ACT nasal spray Place 2 sprays into both nostrils daily.  01/09/14   [provider]  gabapentin (NEURONTIN) 300 MG capsule Take 300 mg by mouth at bedtime.     [provider]  lansoprazole (PREVACID) 30 MG capsule Take 30 mg by mouth daily at 12 noon.    [provider]  loratadine (CLARITIN) 10 MG tablet Take 10 mg by mouth daily.    [provider]  Melatonin 10 MG TABS Take 10 tablets by mouth at bedtime.    [provider]  topiramate (TOPAMAX) 100 MG tablet Take 100 mg by mouth at bedtime. 1 1/2 tab    [provider]  vitamin B-12 (CYANOCOBALAMIN) 1000 MCG tablet Take 1,000 mcg by mouth daily.    [provider]  vitamin C (ASCORBIC ACID) 500 MG tablet Take 500 mg by mouth 2 (two) times daily.     [provider]    Allergies as of 12/30/2016 - Review Complete 10/16/2016  Allergen Reaction Noted  . Avelox [moxifloxacin hcl in nacl] Other (See Comments) 01/29/2011  . Codeine Nausea Only 05/31/2012  . Shrimp [shellfish allergy]  10/06/2016  . Ciprofloxacin Rash 05/01/2016  . Moxifloxacin Hives and Rash 03/07/2013    Family History  Problem Relation Age of Onset  . Heart attack Mother 67  . Heart failure Mother   . Heart attack Father   . Heart disease Father         CABG  . Heart attack Brother 72       premature    Social History   Social History  . Marital status: Married    Spouse name: N/A  . Number of children: N/A  . Years of education: N/A   Occupational History  . Retired Retired   Social History Main Topics  . Smoking status: Never Smoker  . Smokeless tobacco: Never Used  . Alcohol use No  . Drug use: No  . Sexual activity: Not on file   Other Topics Concern  . Not on file   Social History Narrative   Retired 1997. Married.     Review of Systems: See HPI, otherwise negative ROS  Physical Exam: BP 116/63   Pulse (!) 56   Temp (!) 96.3 F (35.7 C) (Tympanic)   Resp 16   Ht 5' 5.5" (1.664 m)   Wt 68.9 kg (152 lb)   SpO2 100%   BMI 24.91 kg/m  General:   Alert,  pleasant and cooperative in NAD Head:  Normocephalic and atraumatic. Neck:  Supple; no masses or thyromegaly. Lungs:  Clear throughout to auscultation.    Heart:  Regular rate and rhythm. Abdomen:  Soft, nontender and nondistended. Normal bowel sounds, without guarding, and without rebound.   Neurologic:  Alert and  oriented x4;  grossly normal neurologically.  Impression/Plan: Jocelyn Hooper is here for an colonoscopy to be performed for personal history of colon polyps and change in bowel habits.  Risks, benefits, limitations, and alternatives regarding  colonoscopy have been reviewed with the patient.  Questions have been answered.  All parties agreeable.   Gaylyn Cheers, MD  03/10/2017, 2:29 PM

## 2017-03-10 NOTE — Anesthesia Preprocedure Evaluation (Signed)
Anesthesia Evaluation  Patient identified by MRN, date of birth, ID band Patient awake    Reviewed: Allergy & Precautions, NPO status , Patient's Chart, lab work & pertinent test results  History of Anesthesia Complications Negative for: history of anesthetic complications  Airway Mallampati: I  TM Distance: >3 FB Neck ROM: Full    Dental no notable dental hx.    Pulmonary neg pulmonary ROS, neg sleep apnea, neg COPD,    breath sounds clear to auscultation- rhonchi (-) wheezing      Cardiovascular Exercise Tolerance: Good (-) hypertension+ CAD  (-) Cardiac Stents  Rhythm:Regular Rate:Normal - Systolic murmurs and - Diastolic murmurs    Neuro/Psych  Headaches, negative psych ROS   GI/Hepatic Neg liver ROS, GERD  ,  Endo/Other  negative endocrine ROS  Renal/GU Renal InsufficiencyRenal disease     Musculoskeletal  (+) Arthritis ,   Abdominal (+) + obese,   Peds  Hematology negative hematology ROS (+)   Anesthesia Other Findings Past Medical History: No date: Bursitis of left shoulder No date: Chest pain, unspecified No date: Chronic kidney disease     Comment: KIDNEY STONE CURRENTLY-08-2015-SEES DR COPE No date: Complication of anesthesia     Comment: PT HAS SEVERE SCOLIOSIS AND FOR TKR ANESTHESIA              COULD NOT GET A SPINAL No date: Coronary artery disease     Comment: ascvd No date: CSF leak No date: DDD (degenerative disc disease), cervical No date: Diverticulosis No date: Esophageal reflux No date: Fibrocystic breast disease No date: GERD (gastroesophageal reflux disease) No date: Headache     Comment: H/O No date: History of colon polyps No date: IBS (irritable bowel syndrome) No date: Nasal polyps No date: Other and unspecified hyperlipidemia No date: Scoliosis No date: Trigeminal neuralgia syndrome No date: Unspecified sinusitis (chronic) No date: Vitamin B12 deficiency   Reproductive/Obstetrics                             Anesthesia Physical Anesthesia Plan  ASA: II  Anesthesia Plan: General   Post-op Pain Management:    Induction: Intravenous  PONV Risk Score and Plan: 2 and Propofol  Airway Management Planned: Natural Airway  Additional Equipment:   Intra-op Plan:   Post-operative Plan:   Informed Consent: I have reviewed the patients History and Physical, chart, labs and discussed the procedure including the risks, benefits and alternatives for the proposed anesthesia with the patient or authorized representative who has indicated his/her understanding and acceptance.   Dental advisory given  Plan Discussed with: CRNA and Anesthesiologist  Anesthesia Plan Comments:         Anesthesia Quick Evaluation

## 2017-03-10 NOTE — Op Note (Signed)
[facility] Gastroenterology Patient Name: Jocelyn Hooper Procedure Date: 03/10/2017 10:44 AM MRN: [id number] Account #: [id number] Date of Birth: [birth year] Admit Type: Outpatient Age: 76 Room: [facility] ENDO ROOM 3 Gender: Female Note Status: Finalized Procedure:            Colonoscopy Indications:          Change in bowel habits Providers:            Manya Silvas, MD Referring MD:         Rusty Aus, MD (Referring MD) Medicines:            Propofol per Anesthesia Complications:        No immediate complications. Procedure:            Pre-Anesthesia Assessment:                       - After reviewing the risks and benefits, the patient                        was deemed in satisfactory condition to undergo the                        procedure.                       After obtaining informed consent, the colonoscope was                        passed under direct vision. Throughout the procedure,                        the patient's blood pressure, pulse, and oxygen                        saturations were monitored continuously. The Olympus                        PCF-H180AL colonoscope ( S#: [id number] ) was introduced                        through the anus and advanced to the the cecum,                        identified by appendiceal orifice and ileocecal valve.                        The colonoscopy was somewhat difficult due to                        restricted mobility of the colon. Successful completion                        of the procedure was aided by applying abdominal                        pressure. The patient tolerated the procedure well. The                        quality of the bowel preparation was good. Findings:      Multiple small-mouthed diverticula were found in the sigmoid  colon.      Internal hemorrhoids were found during endoscopy. The hemorrhoids were       small and Grade I (internal hemorrhoids that do not prolapse).      The exam was  otherwise without abnormality. Impression:           - Diverticulosis in the sigmoid colon.                       - Internal hemorrhoids.                       - The examination was otherwise normal.                       - No specimens collected. Recommendation:       - The findings and recommendations were discussed with                        the patient. No routine repeat colon recomended. Manya Silvas, MD 03/10/2017 11:26:00 AM This report has been signed electronically. Number of Addenda: 0 Note Initiated On: 03/10/2017 10:44 AM Scope Withdrawal Time: 0 hours 10 minutes 59 seconds  Total Procedure Duration: 0 hours 23 minutes 28 seconds       [facility]

## 2017-03-10 NOTE — Anesthesia Postprocedure Evaluation (Signed)
Anesthesia Post Note  Patient: Gerhard Munch Poser  Procedure(s) Performed: Procedure(s) (LRB): COLONOSCOPY WITH PROPOFOL (N/A)  Patient location during evaluation: Endoscopy Anesthesia Type: General Level of consciousness: awake and alert and oriented Pain management: pain level controlled Vital Signs Assessment: post-procedure vital signs reviewed and stable Respiratory status: spontaneous breathing, nonlabored ventilation and respiratory function stable Cardiovascular status: blood pressure returned to baseline and stable Postop Assessment: no signs of nausea or vomiting Anesthetic complications: no     Last Vitals:  Vitals:   03/10/17 1147 03/10/17 1156  BP: 105/62 116/63  Pulse: (!) 58 (!) 56  Resp: 20 16  Temp:      Last Pain:  Vitals:   03/10/17 1127  TempSrc: Tympanic                 Luisana Lutzke

## 2017-03-10 NOTE — Anesthesia Procedure Notes (Signed)
Performed by: COOK-MARTIN, Mea Ozga Pre-anesthesia Checklist: Patient identified, Emergency Drugs available, Suction available, Patient being monitored and Timeout performed Patient Re-evaluated:Patient Re-evaluated prior to inductionOxygen Delivery Method: Nasal cannula Preoxygenation: Pre-oxygenation with 100% oxygen Intubation Type: IV induction Placement Confirmation: positive ETCO2 and CO2 detector       

## 2017-03-11 ENCOUNTER — Encounter: Payer: Self-pay | Admitting: Unknown Physician Specialty

## 2017-04-06 DIAGNOSIS — M1711 Unilateral primary osteoarthritis, right knee: Secondary | ICD-10-CM | POA: Diagnosis not present

## 2017-04-28 DIAGNOSIS — N3020 Other chronic cystitis without hematuria: Secondary | ICD-10-CM | POA: Diagnosis not present

## 2017-04-28 DIAGNOSIS — R339 Retention of urine, unspecified: Secondary | ICD-10-CM | POA: Diagnosis not present

## 2017-05-27 DIAGNOSIS — M1711 Unilateral primary osteoarthritis, right knee: Secondary | ICD-10-CM | POA: Diagnosis not present

## 2017-06-15 DIAGNOSIS — Z79899 Other long term (current) drug therapy: Secondary | ICD-10-CM | POA: Diagnosis not present

## 2017-06-15 DIAGNOSIS — E538 Deficiency of other specified B group vitamins: Secondary | ICD-10-CM | POA: Diagnosis not present

## 2017-06-16 DIAGNOSIS — J340 Abscess, furuncle and carbuncle of nose: Secondary | ICD-10-CM | POA: Diagnosis not present

## 2017-06-16 DIAGNOSIS — R51 Headache: Secondary | ICD-10-CM | POA: Diagnosis not present

## 2017-06-23 DIAGNOSIS — I2510 Atherosclerotic heart disease of native coronary artery without angina pectoris: Secondary | ICD-10-CM | POA: Diagnosis not present

## 2017-06-23 DIAGNOSIS — E782 Mixed hyperlipidemia: Secondary | ICD-10-CM | POA: Diagnosis not present

## 2017-06-23 DIAGNOSIS — E538 Deficiency of other specified B group vitamins: Secondary | ICD-10-CM | POA: Diagnosis not present

## 2017-06-23 DIAGNOSIS — G500 Trigeminal neuralgia: Secondary | ICD-10-CM | POA: Diagnosis not present

## 2017-06-23 DIAGNOSIS — Z0000 Encounter for general adult medical examination without abnormal findings: Secondary | ICD-10-CM | POA: Diagnosis not present

## 2017-06-30 DIAGNOSIS — R51 Headache: Secondary | ICD-10-CM | POA: Diagnosis not present

## 2017-06-30 DIAGNOSIS — G43709 Chronic migraine without aura, not intractable, without status migrainosus: Secondary | ICD-10-CM | POA: Diagnosis not present

## 2017-07-01 DIAGNOSIS — G43709 Chronic migraine without aura, not intractable, without status migrainosus: Secondary | ICD-10-CM | POA: Diagnosis not present

## 2017-07-08 DIAGNOSIS — G43709 Chronic migraine without aura, not intractable, without status migrainosus: Secondary | ICD-10-CM | POA: Diagnosis not present

## 2017-07-14 DIAGNOSIS — G43709 Chronic migraine without aura, not intractable, without status migrainosus: Secondary | ICD-10-CM | POA: Diagnosis not present

## 2017-07-15 DIAGNOSIS — H04123 Dry eye syndrome of bilateral lacrimal glands: Secondary | ICD-10-CM | POA: Diagnosis not present

## 2017-08-17 DIAGNOSIS — M1711 Unilateral primary osteoarthritis, right knee: Secondary | ICD-10-CM | POA: Diagnosis not present

## 2017-08-31 DIAGNOSIS — N3020 Other chronic cystitis without hematuria: Secondary | ICD-10-CM | POA: Diagnosis not present

## 2017-08-31 DIAGNOSIS — R339 Retention of urine, unspecified: Secondary | ICD-10-CM | POA: Diagnosis not present

## 2017-08-31 DIAGNOSIS — I878 Other specified disorders of veins: Secondary | ICD-10-CM | POA: Diagnosis not present

## 2017-08-31 DIAGNOSIS — N200 Calculus of kidney: Secondary | ICD-10-CM | POA: Diagnosis not present

## 2017-09-01 DIAGNOSIS — R194 Change in bowel habit: Secondary | ICD-10-CM | POA: Diagnosis not present

## 2017-09-16 ENCOUNTER — Other Ambulatory Visit: Payer: Self-pay

## 2017-09-16 ENCOUNTER — Encounter
Admission: RE | Admit: 2017-09-16 | Discharge: 2017-09-16 | Disposition: A | Discharge status: Home / Self Care | Payer: PPO | Source: Ambulatory Visit | Attending: Orthopedic Surgery | Admitting: Orthopedic Surgery

## 2017-09-16 DIAGNOSIS — K219 Gastro-esophageal reflux disease without esophagitis: Secondary | ICD-10-CM | POA: Insufficient documentation

## 2017-09-16 DIAGNOSIS — N289 Disorder of kidney and ureter, unspecified: Secondary | ICD-10-CM | POA: Diagnosis not present

## 2017-09-16 DIAGNOSIS — M25561 Pain in right knee: Secondary | ICD-10-CM | POA: Diagnosis not present

## 2017-09-16 DIAGNOSIS — I2510 Atherosclerotic heart disease of native coronary artery without angina pectoris: Secondary | ICD-10-CM | POA: Diagnosis not present

## 2017-09-16 DIAGNOSIS — E785 Hyperlipidemia, unspecified: Secondary | ICD-10-CM | POA: Diagnosis not present

## 2017-09-16 DIAGNOSIS — R001 Bradycardia, unspecified: Secondary | ICD-10-CM | POA: Insufficient documentation

## 2017-09-16 DIAGNOSIS — D696 Thrombocytopenia, unspecified: Secondary | ICD-10-CM | POA: Diagnosis not present

## 2017-09-16 DIAGNOSIS — Z01810 Encounter for preprocedural cardiovascular examination: Secondary | ICD-10-CM | POA: Diagnosis not present

## 2017-09-16 DIAGNOSIS — Z01812 Encounter for preprocedural laboratory examination: Secondary | ICD-10-CM | POA: Insufficient documentation

## 2017-09-16 HISTORY — DX: Personal history of urinary calculi: Z87.442

## 2017-09-16 HISTORY — DX: Personal history of other diseases of the digestive system: Z87.19

## 2017-09-16 HISTORY — DX: Tinnitus, bilateral: H93.13

## 2017-09-16 LAB — COMPREHENSIVE METABOLIC PANEL
ALBUMIN: 4.2 g/dL (ref 3.5–5.0)
ALT: 31 U/L (ref 14–54)
AST: 30 U/L (ref 15–41)
Alkaline Phosphatase: 75 U/L (ref 38–126)
Anion gap: 8 (ref 5–15)
BUN: 19 mg/dL (ref 6–20)
CHLORIDE: 110 mmol/L (ref 101–111)
CO2: 23 mmol/L (ref 22–32)
Calcium: 9.4 mg/dL (ref 8.9–10.3)
Creatinine, Ser: 0.99 mg/dL (ref 0.44–1.00)
GFR calc Af Amer: >60 mL/min (ref >60)
GFR calc non Af Amer: 53 mL/min — ABNORMAL LOW (ref >60)
GLUCOSE: 72 mg/dL (ref 65–99)
POTASSIUM: 3.9 mmol/L (ref 3.5–5.1)
Sodium: 141 mmol/L (ref 135–145)
Total Bilirubin: 0.3 mg/dL (ref 0.3–1.2)
Total Protein: 6.9 g/dL (ref 6.5–8.1)

## 2017-09-16 LAB — URINALYSIS, ROUTINE W REFLEX MICROSCOPIC
BILIRUBIN URINE: NEGATIVE
Glucose, UA: NEGATIVE mg/dL
Hgb urine dipstick: NEGATIVE
Ketones, ur: NEGATIVE mg/dL
Leukocytes, UA: NEGATIVE
NITRITE: NEGATIVE
PH: 6 (ref 5.0–8.0)
Protein, ur: NEGATIVE mg/dL
Specific Gravity, Urine: 1.013 (ref 1.005–1.030)

## 2017-09-16 LAB — PROTIME-INR
INR: 0.93
Prothrombin Time: 12.4 seconds (ref 11.4–15.2)

## 2017-09-16 LAB — CBC
HCT: 42.2 % (ref 35.0–47.0)
Hemoglobin: 14.1 g/dL (ref 12.0–16.0)
MCH: 31.7 pg (ref 26.0–34.0)
MCHC: 33.3 g/dL (ref 32.0–36.0)
MCV: 95.2 fL (ref 80.0–100.0)
PLATELETS: 247 10*3/uL (ref 150–440)
RBC: 4.44 MIL/uL (ref 3.80–5.20)
RDW: 13.7 % (ref 11.5–14.5)
WBC: 6.5 10*3/uL (ref 3.6–11.0)

## 2017-09-16 LAB — TYPE AND SCREEN
ABO/RH(D): A POS
ANTIBODY SCREEN: NEGATIVE

## 2017-09-16 LAB — C-REACTIVE PROTEIN: CRP: 1 mg/dL — ABNORMAL HIGH (ref <1.0)

## 2017-09-16 LAB — APTT: aPTT: 27 seconds (ref 24–36)

## 2017-09-16 LAB — SURGICAL PCR SCREEN
MRSA, PCR: NEGATIVE
Staphylococcus aureus: NEGATIVE

## 2017-09-16 LAB — SEDIMENTATION RATE: SED RATE: 8 mm/h (ref 0–30)

## 2017-09-16 NOTE — Patient Instructions (Addendum)
Your procedure is scheduled on: September 27, 2017 (Monday ) Report to Same Day Surgery 2nd floor medical mall [facility] Entrance-take elevator on left to 2nd floor.  Check in with surgery information desk.) To find out your arrival time please call [phone] between 1PM - 3PM on September 24, 2017 (Friday ) Remember: Instructions that are not followed completely may result in serious medical risk, up to and including death, or upon the discretion of your surgeon and anesthesiologist your surgery may need to be rescheduled.    _x___ 1. Do not eat food after midnight the night before your procedure. You may drink clear liquids up to 2 hours before you are scheduled to arrive at the hospital for your procedure.  Do not drink clear liquids within 2 hours of your scheduled arrival to the hospital.  Clear liquids include  --Water or Apple juice without pulp  --Clear carbohydrate beverage such as ClearFast or Gatorade  --Black Coffee or Clear Tea (No milk, no creamers, do not add anything to  the coffee or tea               Type 1 and type 2 diabetics should only drink water.  No gum chewing or hard candies.     __x__ 2. No Alcohol for 24 hours before or after surgery.   __x__3. No Smoking for 24 prior to surgery.   ____  4. Bring all medications with you on the day of surgery if instructed.    __x__ 5. Notify your doctor if there is any change in your medical condition     (cold, fever, infections).     Do not wear jewelry, make-up, hairpins, clips or nail polish.  Do not wear lotions, powders, or perfumes.   Do not shave 48 hours prior to surgery. Men may shave face and neck.  Do not bring valuables to the hospital.    [facility] is not responsible for any belongings or valuables.               Contacts, dentures or bridgework may not be worn into surgery.  Leave your suitcase in the car. After surgery it may be brought to your room.  For patients admitted to the hospital,  discharge time is determined by your treatment team                     .   Patients discharged the day of surgery will not be allowed to drive home.  You will need someone to drive you home and stay with you the night of your procedure.    Please read over the following fact sheets that you were given:   [facility] Preparing for Surgery and or MRSA Information   _x___ Take anti-hypertensive listed below, cardiac, seizure, asthma,     anti-reflux and psychiatric medicines with a sip of water. These include:  1. METOPROLOL  2. LANSOPRAZOLE (PREVACID )  3.  4.  5.  6.  ____Fleets enema or Magnesium Citrate as directed.   _x___ Use CHG Soap or sage wipes as directed on instruction sheet   ____ Use inhalers on the day of surgery and bring to hospital day of surgery  ____ Stop Metformin and Janumet 2 days prior to surgery.    ____ Take 1/2 of usual insulin dose the night before surgery and none on the morning surgery     _x___ Follow recommendations from Cardiologist, Pulmonologist or PCP regarding  stopping Aspirin, Coumadin, Plavix ,Eliquis, Effient, or Pradaxa, and Pletal. (STOP ASPIRIN ONE WEEK PRIOR TO SURGERY )  X____Stop Anti-inflammatories such as Advil, Aleve, Ibuprofen, Motrin, Naproxen, Naprosyn, Goodies powders or aspirin products. OK to take Tylenol    _x___ Stop supplements until after surgery.  But may continue Vitamin D, Vitamin B, and multivitamin (STOP VITAMIN C AND MELATONIN  NOW )     ____ Bring C-Pap to the hospital.

## 2017-09-17 LAB — URINE CULTURE
CULTURE: NO GROWTH
Special Requests: NORMAL

## 2017-09-26 MED ORDER — CEFAZOLIN SODIUM-DEXTROSE 2-4 GM/100ML-% IV SOLN: 2.0000 g | INTRAVENOUS | Status: DC

## 2017-09-26 MED ORDER — TRANEXAMIC ACID 1000 MG/10ML IV SOLN
1000.0000 mg | INTRAVENOUS | Status: DC
  Filled 2017-09-26: qty 10

## 2017-09-27 ENCOUNTER — Inpatient Hospital Stay
Admission: RE | Admit: 2017-09-27 | Discharge: 2017-09-29 | DRG: 470 | Disposition: A | Discharge status: Home / Self Care | Payer: PPO | Source: Ambulatory Visit | Attending: Orthopedic Surgery | Admitting: Orthopedic Surgery

## 2017-09-27 ENCOUNTER — Inpatient Hospital Stay: Payer: PPO | Admitting: Anesthesiology

## 2017-09-27 ENCOUNTER — Encounter
Admission: RE | Disposition: A | Discharge status: Home / Self Care | Payer: Self-pay | Source: Ambulatory Visit | Attending: Orthopedic Surgery

## 2017-09-27 ENCOUNTER — Encounter: Payer: Self-pay | Admitting: Orthopedic Surgery

## 2017-09-27 ENCOUNTER — Other Ambulatory Visit: Payer: Self-pay

## 2017-09-27 ENCOUNTER — Inpatient Hospital Stay: Payer: PPO

## 2017-09-27 DIAGNOSIS — K5790 Diverticulosis of intestine, part unspecified, without perforation or abscess without bleeding: Secondary | ICD-10-CM | POA: Diagnosis not present

## 2017-09-27 DIAGNOSIS — J329 Chronic sinusitis, unspecified: Secondary | ICD-10-CM | POA: Diagnosis not present

## 2017-09-27 DIAGNOSIS — E785 Hyperlipidemia, unspecified: Secondary | ICD-10-CM | POA: Diagnosis present

## 2017-09-27 DIAGNOSIS — Z91013 Allergy to seafood: Secondary | ICD-10-CM | POA: Diagnosis not present

## 2017-09-27 DIAGNOSIS — Z87442 Personal history of urinary calculi: Secondary | ICD-10-CM | POA: Diagnosis not present

## 2017-09-27 DIAGNOSIS — Z86010 Personal history of colonic polyps: Secondary | ICD-10-CM

## 2017-09-27 DIAGNOSIS — M1711 Unilateral primary osteoarthritis, right knee: Principal | ICD-10-CM | POA: Diagnosis present

## 2017-09-27 DIAGNOSIS — K219 Gastro-esophageal reflux disease without esophagitis: Secondary | ICD-10-CM | POA: Diagnosis not present

## 2017-09-27 DIAGNOSIS — M419 Scoliosis, unspecified: Secondary | ICD-10-CM | POA: Diagnosis not present

## 2017-09-27 DIAGNOSIS — K589 Irritable bowel syndrome without diarrhea: Secondary | ICD-10-CM | POA: Diagnosis present

## 2017-09-27 DIAGNOSIS — I2510 Atherosclerotic heart disease of native coronary artery without angina pectoris: Secondary | ICD-10-CM | POA: Diagnosis not present

## 2017-09-27 DIAGNOSIS — M5136 Other intervertebral disc degeneration, lumbar region: Secondary | ICD-10-CM | POA: Diagnosis present

## 2017-09-27 DIAGNOSIS — Z881 Allergy status to other antibiotic agents status: Secondary | ICD-10-CM | POA: Diagnosis not present

## 2017-09-27 DIAGNOSIS — N6019 Diffuse cystic mastopathy of unspecified breast: Secondary | ICD-10-CM | POA: Diagnosis present

## 2017-09-27 DIAGNOSIS — J45909 Unspecified asthma, uncomplicated: Secondary | ICD-10-CM | POA: Diagnosis not present

## 2017-09-27 DIAGNOSIS — Z90710 Acquired absence of both cervix and uterus: Secondary | ICD-10-CM | POA: Diagnosis not present

## 2017-09-27 DIAGNOSIS — Z885 Allergy status to narcotic agent status: Secondary | ICD-10-CM

## 2017-09-27 DIAGNOSIS — Z96651 Presence of right artificial knee joint: Secondary | ICD-10-CM | POA: Diagnosis not present

## 2017-09-27 DIAGNOSIS — Z9089 Acquired absence of other organs: Secondary | ICD-10-CM | POA: Diagnosis not present

## 2017-09-27 DIAGNOSIS — Z981 Arthrodesis status: Secondary | ICD-10-CM

## 2017-09-27 DIAGNOSIS — Z9049 Acquired absence of other specified parts of digestive tract: Secondary | ICD-10-CM | POA: Diagnosis not present

## 2017-09-27 DIAGNOSIS — Z96659 Presence of unspecified artificial knee joint: Secondary | ICD-10-CM

## 2017-09-27 DIAGNOSIS — M5030 Other cervical disc degeneration, unspecified cervical region: Secondary | ICD-10-CM | POA: Diagnosis present

## 2017-09-27 DIAGNOSIS — Z471 Aftercare following joint replacement surgery: Secondary | ICD-10-CM | POA: Diagnosis not present

## 2017-09-27 HISTORY — PX: KNEE ARTHROPLASTY: SHX992

## 2017-09-27 LAB — ABO/RH: ABO/RH(D): A POS

## 2017-09-27 SURGERY — ARTHROPLASTY, KNEE, TOTAL, USING IMAGELESS COMPUTER-ASSISTED NAVIGATION
Anesthesia: General | Laterality: Right

## 2017-09-27 MED ORDER — BUPIVACAINE LIPOSOME 1.3 % IJ SUSP
INTRAMUSCULAR | Status: AC
  Filled 2017-09-27: qty 20

## 2017-09-27 MED ORDER — OXYCODONE HCL 5 MG PO TABS: 10.0000 mg | ORAL_TABLET | ORAL | Status: DC | PRN

## 2017-09-27 MED ORDER — LIDOCAINE HCL (PF) 2 % IJ SOLN
INTRAMUSCULAR | Status: AC
  Filled 2017-09-27: qty 10

## 2017-09-27 MED ORDER — PHENOL 1.4 % MT LIQD
1.0000 | OROMUCOSAL | Status: DC | PRN
  Filled 2017-09-27: qty 177

## 2017-09-27 MED ORDER — LACTATED RINGERS IV SOLN
INTRAVENOUS | Status: DC
  Administered 2017-09-27 (×2): via INTRAVENOUS

## 2017-09-27 MED ORDER — REMIFENTANIL HCL 1 MG IV SOLR
INTRAVENOUS | Status: DC | PRN
  Administered 2017-09-27: .05 ug/kg/min via INTRAVENOUS

## 2017-09-27 MED ORDER — ENOXAPARIN SODIUM 30 MG/0.3ML ~~LOC~~ SOLN
30.0000 mg | Freq: Two times a day (BID) | SUBCUTANEOUS | Status: DC
  Administered 2017-09-28 – 2017-09-29 (×3): 30 mg via SUBCUTANEOUS
  Filled 2017-09-27 (×3): qty 0.3

## 2017-09-27 MED ORDER — ACETAMINOPHEN 10 MG/ML IV SOLN
INTRAVENOUS | Status: DC | PRN
  Administered 2017-09-27: 1000 mg via INTRAVENOUS

## 2017-09-27 MED ORDER — ONDANSETRON HCL 4 MG PO TABS: 4.0000 mg | ORAL_TABLET | Freq: Four times a day (QID) | ORAL | Status: DC | PRN

## 2017-09-27 MED ORDER — VITAMIN C 500 MG PO TABS
1000.0000 mg | ORAL_TABLET | Freq: Two times a day (BID) | ORAL | Status: DC
  Administered 2017-09-27 – 2017-09-29 (×4): 1000 mg via ORAL
  Filled 2017-09-27 (×5): qty 2

## 2017-09-27 MED ORDER — MAGNESIUM HYDROXIDE 400 MG/5ML PO SUSP
30.0000 mL | Freq: Every day | ORAL | Status: DC | PRN
  Administered 2017-09-29: 30 mL via ORAL
  Filled 2017-09-27: qty 30

## 2017-09-27 MED ORDER — SODIUM CHLORIDE 0.9 % IJ SOLN
INTRAMUSCULAR | Status: DC | PRN
  Administered 2017-09-27: 40 mL via INTRAVENOUS

## 2017-09-27 MED ORDER — ACETAMINOPHEN 10 MG/ML IV SOLN
1000.0000 mg | Freq: Four times a day (QID) | INTRAVENOUS | Status: AC
  Administered 2017-09-27 – 2017-09-28 (×4): 1000 mg via INTRAVENOUS
  Filled 2017-09-27 (×4): qty 100

## 2017-09-27 MED ORDER — DEXAMETHASONE SODIUM PHOSPHATE 10 MG/ML IJ SOLN
INTRAMUSCULAR | Status: AC
Start: 2017-09-27 — End: ?
  Filled 2017-09-27: qty 1

## 2017-09-27 MED ORDER — ONDANSETRON HCL 4 MG/2ML IJ SOLN
INTRAMUSCULAR | Status: AC
  Filled 2017-09-27: qty 2

## 2017-09-27 MED ORDER — GLYCOPYRROLATE 0.2 MG/ML IJ SOLN
INTRAMUSCULAR | Status: AC
  Filled 2017-09-27: qty 1

## 2017-09-27 MED ORDER — FENTANYL CITRATE (PF) 100 MCG/2ML IJ SOLN
INTRAMUSCULAR | Status: DC | PRN
  Administered 2017-09-27: 100 ug via INTRAVENOUS

## 2017-09-27 MED ORDER — SENNOSIDES-DOCUSATE SODIUM 8.6-50 MG PO TABS
1.0000 | ORAL_TABLET | Freq: Two times a day (BID) | ORAL | Status: DC
  Administered 2017-09-27 – 2017-09-29 (×4): 1 via ORAL
  Filled 2017-09-27 (×4): qty 1

## 2017-09-27 MED ORDER — SODIUM CHLORIDE 0.9 % IJ SOLN
INTRAMUSCULAR | Status: AC
  Filled 2017-09-27: qty 50

## 2017-09-27 MED ORDER — ROCURONIUM BROMIDE 50 MG/5ML IV SOLN
INTRAVENOUS | Status: AC
  Filled 2017-09-27: qty 1

## 2017-09-27 MED ORDER — SODIUM CHLORIDE 0.9 % IV SOLN
INTRAVENOUS | Status: DC | PRN
  Administered 2017-09-27: 1000 mg via INTRAVENOUS

## 2017-09-27 MED ORDER — METOPROLOL TARTRATE 25 MG PO TABS
12.5000 mg | ORAL_TABLET | Freq: Two times a day (BID) | ORAL | Status: DC
  Administered 2017-09-28 – 2017-09-29 (×2): 12.5 mg via ORAL
  Filled 2017-09-27 (×3): qty 1

## 2017-09-27 MED ORDER — PROPOFOL 500 MG/50ML IV EMUL
INTRAVENOUS | Status: AC
  Filled 2017-09-27: qty 50

## 2017-09-27 MED ORDER — TRANEXAMIC ACID 1000 MG/10ML IV SOLN
1000.0000 mg | Freq: Once | INTRAVENOUS | Status: AC
  Administered 2017-09-27: 1000 mg via INTRAVENOUS
  Filled 2017-09-27: qty 10

## 2017-09-27 MED ORDER — METOPROLOL TARTRATE 5 MG/5ML IV SOLN
INTRAVENOUS | Status: AC
  Filled 2017-09-27: qty 5

## 2017-09-27 MED ORDER — ONDANSETRON HCL 4 MG/2ML IJ SOLN
4.0000 mg | Freq: Four times a day (QID) | INTRAMUSCULAR | Status: DC | PRN
  Administered 2017-09-28: 4 mg via INTRAVENOUS
  Filled 2017-09-27 (×2): qty 2

## 2017-09-27 MED ORDER — HYDROMORPHONE HCL 1 MG/ML IJ SOLN
INTRAMUSCULAR | Status: AC
  Filled 2017-09-27: qty 1

## 2017-09-27 MED ORDER — KETAMINE HCL 50 MG/ML IJ SOLN
INTRAMUSCULAR | Status: DC | PRN
  Administered 2017-09-27: 120 mg via INTRAMUSCULAR

## 2017-09-27 MED ORDER — ACETAMINOPHEN 650 MG RE SUPP: 650.0000 mg | RECTAL | Status: DC | PRN

## 2017-09-27 MED ORDER — DIPHENHYDRAMINE HCL 25 MG PO CAPS: 25.0000 mg | ORAL_CAPSULE | Freq: Every evening | ORAL | Status: DC | PRN

## 2017-09-27 MED ORDER — BUPIVACAINE HCL (PF) 0.25 % IJ SOLN
INTRAMUSCULAR | Status: DC | PRN
  Administered 2017-09-27: 60 mL

## 2017-09-27 MED ORDER — OXYCODONE HCL 5 MG PO TABS
5.0000 mg | ORAL_TABLET | ORAL | Status: DC | PRN
  Administered 2017-09-28 (×2): 5 mg via ORAL
  Filled 2017-09-27 (×2): qty 1

## 2017-09-27 MED ORDER — METOPROLOL TARTRATE 5 MG/5ML IV SOLN
INTRAVENOUS | Status: DC | PRN
  Administered 2017-09-27: 2 mg via INTRAVENOUS

## 2017-09-27 MED ORDER — HYDROMORPHONE HCL 1 MG/ML IJ SOLN
INTRAMUSCULAR | Status: DC | PRN
  Administered 2017-09-27: 1 mg via INTRAVENOUS

## 2017-09-27 MED ORDER — LIDOCAINE HCL (CARDIAC) 20 MG/ML IV SOLN
INTRAVENOUS | Status: DC | PRN
  Administered 2017-09-27: 100 mg via INTRAVENOUS

## 2017-09-27 MED ORDER — FENTANYL CITRATE (PF) 100 MCG/2ML IJ SOLN
INTRAMUSCULAR | Status: AC
  Administered 2017-09-27: 25 ug via INTRAVENOUS
  Filled 2017-09-27: qty 2

## 2017-09-27 MED ORDER — CEFAZOLIN SODIUM-DEXTROSE 2-3 GM-%(50ML) IV SOLR
INTRAVENOUS | Status: DC | PRN
  Administered 2017-09-27: 2 g via INTRAVENOUS

## 2017-09-27 MED ORDER — DEXAMETHASONE SODIUM PHOSPHATE 4 MG/ML IJ SOLN
INTRAMUSCULAR | Status: DC | PRN
  Administered 2017-09-27: 6 mg via INTRAVENOUS

## 2017-09-27 MED ORDER — MAGNESIUM OXIDE 400 (241.3 MG) MG PO TABS
400.0000 mg | ORAL_TABLET | Freq: Every day | ORAL | Status: DC
  Administered 2017-09-27 – 2017-09-28 (×2): 400 mg via ORAL
  Filled 2017-09-27: qty 1

## 2017-09-27 MED ORDER — ALUM & MAG HYDROXIDE-SIMETH 200-200-20 MG/5ML PO SUSP: 30.0000 mL | ORAL | Status: DC | PRN

## 2017-09-27 MED ORDER — LORATADINE 10 MG PO TABS: 10.0000 mg | ORAL_TABLET | Freq: Every day | ORAL | Status: DC | PRN

## 2017-09-27 MED ORDER — TRAMADOL HCL 50 MG PO TABS
50.0000 mg | ORAL_TABLET | ORAL | Status: DC | PRN
  Administered 2017-09-28 – 2017-09-29 (×2): 100 mg via ORAL
  Administered 2017-09-29 (×2): 50 mg via ORAL
  Filled 2017-09-27: qty 2
  Filled 2017-09-27: qty 1
  Filled 2017-09-27: qty 2
  Filled 2017-09-27: qty 1

## 2017-09-27 MED ORDER — FENTANYL CITRATE (PF) 100 MCG/2ML IJ SOLN
25.0000 ug | INTRAMUSCULAR | Status: AC | PRN
  Administered 2017-09-27 (×6): 25 ug via INTRAVENOUS

## 2017-09-27 MED ORDER — PHENYLEPHRINE HCL 10 MG/ML IJ SOLN
INTRAMUSCULAR | Status: AC
  Filled 2017-09-27: qty 1

## 2017-09-27 MED ORDER — SODIUM CHLORIDE 0.9 % IV SOLN
INTRAVENOUS | Status: DC
  Administered 2017-09-28: 01:00:00 via INTRAVENOUS

## 2017-09-27 MED ORDER — ACETAMINOPHEN 10 MG/ML IV SOLN
INTRAVENOUS | Status: AC
  Filled 2017-09-27: qty 100

## 2017-09-27 MED ORDER — PROPOFOL 10 MG/ML IV BOLUS
INTRAVENOUS | Status: AC
  Filled 2017-09-27: qty 20

## 2017-09-27 MED ORDER — CELECOXIB 200 MG PO CAPS
200.0000 mg | ORAL_CAPSULE | Freq: Two times a day (BID) | ORAL | Status: DC
  Administered 2017-09-27 – 2017-09-29 (×4): 200 mg via ORAL
  Filled 2017-09-27 (×4): qty 1

## 2017-09-27 MED ORDER — ONDANSETRON HCL 4 MG/2ML IJ SOLN
INTRAMUSCULAR | Status: DC | PRN
  Administered 2017-09-27: 4 mg via INTRAVENOUS

## 2017-09-27 MED ORDER — MENTHOL 3 MG MT LOZG
1.0000 | LOZENGE | OROMUCOSAL | Status: DC | PRN
  Filled 2017-09-27: qty 9

## 2017-09-27 MED ORDER — GABAPENTIN 300 MG PO CAPS
300.0000 mg | ORAL_CAPSULE | Freq: Every day | ORAL | Status: DC
  Administered 2017-09-27 – 2017-09-28 (×2): 300 mg via ORAL
  Filled 2017-09-27 (×2): qty 1

## 2017-09-27 MED ORDER — BISACODYL 10 MG RE SUPP
10.0000 mg | Freq: Every day | RECTAL | Status: DC | PRN
  Administered 2017-09-29: 10 mg via RECTAL
  Filled 2017-09-27: qty 1

## 2017-09-27 MED ORDER — NEOMYCIN-POLYMYXIN B GU 40-200000 IR SOLN
Status: AC
  Filled 2017-09-27: qty 20

## 2017-09-27 MED ORDER — SODIUM CHLORIDE 0.9 % IV SOLN
INTRAVENOUS | Status: DC | PRN
  Administered 2017-09-27: 25 ug/min via INTRAVENOUS

## 2017-09-27 MED ORDER — BUPIVACAINE HCL (PF) 0.25 % IJ SOLN
INTRAMUSCULAR | Status: AC
  Filled 2017-09-27: qty 60

## 2017-09-27 MED ORDER — SUGAMMADEX SODIUM 200 MG/2ML IV SOLN
INTRAVENOUS | Status: AC
  Filled 2017-09-27: qty 2

## 2017-09-27 MED ORDER — ONDANSETRON HCL 4 MG/2ML IJ SOLN
4.0000 mg | Freq: Once | INTRAMUSCULAR | Status: AC | PRN
  Administered 2017-09-27: 4 mg via INTRAVENOUS

## 2017-09-27 MED ORDER — FENTANYL CITRATE (PF) 100 MCG/2ML IJ SOLN
INTRAMUSCULAR | Status: AC
  Filled 2017-09-27: qty 2

## 2017-09-27 MED ORDER — PHENYLEPHRINE HCL 10 MG/ML IJ SOLN
INTRAMUSCULAR | Status: DC | PRN
  Administered 2017-09-27: 50 ug via INTRAVENOUS
  Administered 2017-09-27: 100 ug via INTRAVENOUS

## 2017-09-27 MED ORDER — BUPIVACAINE LIPOSOME 1.3 % IJ SUSP
INTRAMUSCULAR | Status: DC | PRN
  Administered 2017-09-27: 60 mL

## 2017-09-27 MED ORDER — CEFAZOLIN SODIUM-DEXTROSE 2-4 GM/100ML-% IV SOLN
2.0000 g | Freq: Four times a day (QID) | INTRAVENOUS | Status: AC
  Administered 2017-09-27 – 2017-09-28 (×4): 2 g via INTRAVENOUS
  Filled 2017-09-27 (×4): qty 100

## 2017-09-27 MED ORDER — ONDANSETRON HCL 4 MG/2ML IJ SOLN
INTRAMUSCULAR | Status: AC
  Administered 2017-09-27: 4 mg
  Filled 2017-09-27: qty 2

## 2017-09-27 MED ORDER — PANTOPRAZOLE SODIUM 40 MG PO TBEC
40.0000 mg | DELAYED_RELEASE_TABLET | Freq: Two times a day (BID) | ORAL | Status: DC
  Administered 2017-09-27 – 2017-09-29 (×4): 40 mg via ORAL
  Filled 2017-09-27 (×4): qty 1

## 2017-09-27 MED ORDER — CEFAZOLIN SODIUM-DEXTROSE 2-4 GM/100ML-% IV SOLN
INTRAVENOUS | Status: AC
  Filled 2017-09-27: qty 100

## 2017-09-27 MED ORDER — ROCURONIUM BROMIDE 100 MG/10ML IV SOLN
INTRAVENOUS | Status: DC | PRN
  Administered 2017-09-27: 50 mg via INTRAVENOUS
  Administered 2017-09-27: 10 mg via INTRAVENOUS
  Administered 2017-09-27: 25 mg via INTRAVENOUS

## 2017-09-27 MED ORDER — NEOMYCIN-POLYMYXIN B GU 40-200000 IR SOLN
Status: DC | PRN
  Administered 2017-09-27: 16 mL

## 2017-09-27 MED ORDER — REMIFENTANIL HCL 1 MG IV SOLR
INTRAVENOUS | Status: AC
  Filled 2017-09-27: qty 1000

## 2017-09-27 MED ORDER — METOCLOPRAMIDE HCL 10 MG PO TABS
10.0000 mg | ORAL_TABLET | Freq: Three times a day (TID) | ORAL | Status: AC
  Administered 2017-09-27 – 2017-09-29 (×8): 10 mg via ORAL
  Filled 2017-09-27 (×8): qty 1

## 2017-09-27 MED ORDER — SUGAMMADEX SODIUM 200 MG/2ML IV SOLN
INTRAVENOUS | Status: DC | PRN
  Administered 2017-09-27: 200 mg via INTRAVENOUS

## 2017-09-27 MED ORDER — MORPHINE SULFATE (PF) 2 MG/ML IV SOLN: 2.0000 mg | INTRAVENOUS | Status: DC | PRN

## 2017-09-27 MED ORDER — FLUTICASONE PROPIONATE 50 MCG/ACT NA SUSP
2.0000 | Freq: Every day | NASAL | Status: DC
  Administered 2017-09-28 – 2017-09-29 (×2): 2 via NASAL
  Filled 2017-09-27: qty 16

## 2017-09-27 MED ORDER — CALCIUM CARBONATE-VITAMIN D 500-200 MG-UNIT PO TABS
1.0000 | ORAL_TABLET | Freq: Two times a day (BID) | ORAL | Status: DC
  Administered 2017-09-27 – 2017-09-29 (×4): 1 via ORAL
  Filled 2017-09-27 (×4): qty 1

## 2017-09-27 MED ORDER — ACETAMINOPHEN 325 MG PO TABS
650.0000 mg | ORAL_TABLET | ORAL | Status: DC | PRN
Start: 2017-09-27 — End: 2017-09-29
  Administered 2017-09-29: 650 mg via ORAL
  Filled 2017-09-27: qty 2

## 2017-09-27 MED ORDER — FLEET ENEMA 7-19 GM/118ML RE ENEM: 1.0000 | ENEMA | Freq: Once | RECTAL | Status: DC | PRN

## 2017-09-27 MED ORDER — TOPIRAMATE 25 MG PO TABS
100.0000 mg | ORAL_TABLET | Freq: Every day | ORAL | Status: DC
  Administered 2017-09-27 – 2017-09-28 (×2): 100 mg via ORAL
  Filled 2017-09-27 (×3): qty 4

## 2017-09-27 MED ORDER — FERROUS SULFATE 325 (65 FE) MG PO TABS
325.0000 mg | ORAL_TABLET | Freq: Two times a day (BID) | ORAL | Status: DC
  Administered 2017-09-28 – 2017-09-29 (×2): 325 mg via ORAL
  Filled 2017-09-27 (×2): qty 1

## 2017-09-27 MED ORDER — DIPHENHYDRAMINE HCL 12.5 MG/5ML PO ELIX: 12.5000 mg | ORAL_SOLUTION | ORAL | Status: DC | PRN

## 2017-09-27 SURGICAL SUPPLY — 64 items
BATTERY INSTRU NAVIGATION (MISCELLANEOUS) ×12 IMPLANT
BLADE SAW 1 (BLADE) ×3 IMPLANT
BLADE SAW 1/2 (BLADE) ×3 IMPLANT
BLADE SAW 70X12.5 (BLADE) IMPLANT
CANISTER SUCT 1200ML W/VALVE (MISCELLANEOUS) ×3 IMPLANT
CANISTER SUCT 3000ML PPV (MISCELLANEOUS) ×6 IMPLANT
CAP KNEE TOTAL 3 SIGMA ×3 IMPLANT
CEMENT HV SMART SET (Cement) ×3 IMPLANT
COOLER POLAR GLACIER W/PUMP (MISCELLANEOUS) ×3 IMPLANT
CUFF TOURN 24 STER (MISCELLANEOUS) IMPLANT
CUFF TOURN 30 STER DUAL PORT (MISCELLANEOUS) ×3 IMPLANT
DRAPE SHEET LG 3/4 BI-LAMINATE (DRAPES) ×3 IMPLANT
DRSG DERMACEA 8X12 NADH (GAUZE/BANDAGES/DRESSINGS) ×3 IMPLANT
DRSG OPSITE POSTOP 4X14 (GAUZE/BANDAGES/DRESSINGS) ×3 IMPLANT
DRSG TEGADERM 4X4.75 (GAUZE/BANDAGES/DRESSINGS) ×3 IMPLANT
DURAPREP 26ML APPLICATOR (WOUND CARE) ×6 IMPLANT
ELECT CAUTERY BLADE 6.4 (BLADE) ×3 IMPLANT
ELECT REM PT RETURN 9FT ADLT (ELECTROSURGICAL) ×3
ELECTRODE REM PT RTRN 9FT ADLT (ELECTROSURGICAL) ×1 IMPLANT
EVACUATOR 1/8 PVC DRAIN (DRAIN) ×3 IMPLANT
EX-PIN ORTHOLOCK NAV 4X150 (PIN) ×6 IMPLANT
GLOVE BIOGEL M STRL SZ7.5 (GLOVE) ×15 IMPLANT
GLOVE BIOGEL PI IND STRL 9 (GLOVE) ×2 IMPLANT
GLOVE BIOGEL PI INDICATOR 9 (GLOVE) ×4
GLOVE INDICATOR 8.0 STRL GRN (GLOVE) ×18 IMPLANT
GLOVE SURG SYN 9.0  PF PI (GLOVE) ×2
GLOVE SURG SYN 9.0 PF PI (GLOVE) ×1 IMPLANT
GOWN STRL REUS W/ TWL LRG LVL3 (GOWN DISPOSABLE) ×2 IMPLANT
GOWN STRL REUS W/TWL 2XL LVL3 (GOWN DISPOSABLE) ×3 IMPLANT
GOWN STRL REUS W/TWL LRG LVL3 (GOWN DISPOSABLE) ×4
HOLDER FOLEY CATH W/STRAP (MISCELLANEOUS) ×3 IMPLANT
HOOD PEEL AWAY FLYTE STAYCOOL (MISCELLANEOUS) ×6 IMPLANT
INSERT TIBIAL PFC SIG SZ3 10MM (Knees) ×3 IMPLANT
KIT RM TURNOVER STRD PROC AR (KITS) ×3 IMPLANT
KNIFE SCULPS 14X20 (INSTRUMENTS) ×3 IMPLANT
LABEL OR SOLS (LABEL) ×3 IMPLANT
NDL SAFETY ECLIPSE 18X1.5 (NEEDLE) ×1 IMPLANT
NEEDLE HYPO 18GX1.5 SHARP (NEEDLE) ×2
NEEDLE SPNL 20GX3.5 QUINCKE YW (NEEDLE) ×6 IMPLANT
NS IRRIG 500ML POUR BTL (IV SOLUTION) ×3 IMPLANT
PACK TOTAL KNEE (MISCELLANEOUS) ×3 IMPLANT
PAD WRAPON POLAR KNEE (MISCELLANEOUS) ×1 IMPLANT
PIN DRILL QUICK PACK ×3 IMPLANT
PIN FIXATION 1/8DIA X 3INL (PIN) ×3 IMPLANT
PULSAVAC PLUS IRRIG FAN TIP (DISPOSABLE) ×3
SOL .9 NS 3000ML IRR  AL (IV SOLUTION) ×2
SOL .9 NS 3000ML IRR UROMATIC (IV SOLUTION) ×1 IMPLANT
SOL PREP PVP 2OZ (MISCELLANEOUS) ×3
SOLUTION PREP PVP 2OZ (MISCELLANEOUS) ×1 IMPLANT
SPONGE DRAIN TRACH 4X4 STRL 2S (GAUZE/BANDAGES/DRESSINGS) ×3 IMPLANT
STAPLER SKIN PROX 35W (STAPLE) ×3 IMPLANT
STRAP TIBIA SHORT (MISCELLANEOUS) ×3 IMPLANT
SUCTION FRAZIER HANDLE 10FR (MISCELLANEOUS) ×2
SUCTION TUBE FRAZIER 10FR DISP (MISCELLANEOUS) ×1 IMPLANT
SUT VIC AB 0 CT1 36 (SUTURE) ×3 IMPLANT
SUT VIC AB 1 CT1 36 (SUTURE) ×6 IMPLANT
SUT VIC AB 2-0 CT2 27 (SUTURE) ×3 IMPLANT
SYR 20CC LL (SYRINGE) ×3 IMPLANT
SYR 30ML LL (SYRINGE) ×6 IMPLANT
TIP FAN IRRIG PULSAVAC PLUS (DISPOSABLE) ×1 IMPLANT
TOWEL OR 17X26 4PK STRL BLUE (TOWEL DISPOSABLE) ×3 IMPLANT
TOWER CARTRIDGE SMART MIX (DISPOSABLE) ×3 IMPLANT
TRAY FOLEY W/METER SILVER 16FR (SET/KITS/TRAYS/PACK) ×3 IMPLANT
WRAPON POLAR PAD KNEE (MISCELLANEOUS) ×3

## 2017-09-27 NOTE — Anesthesia Preprocedure Evaluation (Signed)
Anesthesia Evaluation  Patient identified by MRN, date of birth, ID band Patient awake    Reviewed: Allergy & Precautions, H&P , NPO status , Patient's Chart, lab work & pertinent test results, reviewed documented beta blocker date and time   History of Anesthesia Complications (+) PONV and history of anesthetic complications  Airway Mallampati: II  TM Distance: >3 FB Neck ROM: full    Dental  (+) Teeth Intact   Pulmonary neg pulmonary ROS, shortness of breath, asthma ,    Pulmonary exam normal        Cardiovascular Exercise Tolerance: Good + CAD  negative cardio ROS Normal cardiovascular exam Rate:Normal     Neuro/Psych  Headaches,  Neuromuscular disease negative neurological ROS  negative psych ROS   GI/Hepatic negative GI ROS, Neg liver ROS, hiatal hernia, GERD  ,  Endo/Other  negative endocrine ROS  Renal/GU Renal InsufficiencyRenal diseasenegative Renal ROS  negative genitourinary   Musculoskeletal  (+) Arthritis ,   Abdominal   Peds  Hematology   Anesthesia Other Findings   Reproductive/Obstetrics negative OB ROS                             Anesthesia Physical  Anesthesia Plan  ASA: III  Anesthesia Plan: General LMA   Post-op Pain Management:    Induction:   PONV Risk Score and Plan:   Airway Management Planned:   Additional Equipment:   Intra-op Plan:   Post-operative Plan:   Informed Consent: I have reviewed the patients History and Physical, chart, labs and discussed the procedure including the risks, benefits and alternatives for the proposed anesthesia with the patient or authorized representative who has indicated his/her understanding and acceptance.     Plan Discussed with: CRNA  Anesthesia Plan Comments:         Anesthesia Quick Evaluation

## 2017-09-27 NOTE — Transfer of Care (Signed)
Immediate Anesthesia Transfer of Care Note  Patient: Jocelyn Hooper  Procedure(s) Performed: COMPUTER ASSISTED TOTAL KNEE ARTHROPLASTY (Right )  Patient Location: PACU  Anesthesia Type:General  Level of Consciousness: awake, alert , oriented and patient cooperative  Airway & Oxygen Therapy: Patient Spontanous Breathing and Patient connected to nasal cannula oxygen  Post-op Assessment: Report given to RN and Post -op Vital signs reviewed and stable  Post vital signs: Reviewed and stable  Last Vitals:  Vitals:   09/27/17 0610  BP: 128/65  Pulse: (!) 59  Resp: 15  Temp: 36.4 C  SpO2: 100%    Last Pain:  Vitals:   09/27/17 0610  TempSrc: Oral  PainSc: 7          Complications: No apparent anesthesia complications

## 2017-09-27 NOTE — Anesthesia Post-op Follow-up Note (Signed)
Anesthesia QCDR form completed.        

## 2017-09-27 NOTE — Anesthesia Postprocedure Evaluation (Signed)
Anesthesia Post Note  Patient: Jocelyn Hooper  Procedure(s) Performed: COMPUTER ASSISTED TOTAL KNEE ARTHROPLASTY (Right )  Patient location during evaluation: PACU Anesthesia Type: General Level of consciousness: awake and alert and oriented Pain management: pain level controlled Vital Signs Assessment: post-procedure vital signs reviewed and stable Respiratory status: spontaneous breathing Cardiovascular status: blood pressure returned to baseline Anesthetic complications: no     Last Vitals:  Vitals:   09/27/17 1130 09/27/17 1256  BP: 111/65 129/61  Pulse: 88 67  Resp: (!) 9 18  Temp:    SpO2: 97% 100%    Last Pain:  Vitals:   09/27/17 1105  TempSrc:   PainSc: Asleep                 Taylan Mayhan

## 2017-09-27 NOTE — Anesthesia Procedure Notes (Signed)
Procedure Name: Intubation Date/Time: 09/27/2017 7:29 AM Performed by: Bernardo Heater, CRNA Pre-anesthesia Checklist: Patient identified, Emergency Drugs available, Suction available and Patient being monitored Patient Re-evaluated:Patient Re-evaluated prior to induction Oxygen Delivery Method: Circle system utilized Preoxygenation: Pre-oxygenation with 100% oxygen Induction Type: IV induction Laryngoscope Size: Mac and 3 Grade View: Grade I Tube type: Oral Tube size: 7.0 mm Number of attempts: 1 Placement Confirmation: ETT inserted through vocal cords under direct vision,  positive ETCO2 and breath sounds checked- equal and bilateral Secured at: 21 cm Tube secured with: Tape Dental Injury: Teeth and Oropharynx as per pre-operative assessment

## 2017-09-27 NOTE — Op Note (Signed)
OPERATIVE NOTE  DATE OF SURGERY:  09/27/2017  PATIENT NAME:  Jocelyn Hooper   DOB: Apr 17, 1941  MRN: [id number]  PRE-OPERATIVE DIAGNOSIS: Degenerative arthrosis of the right knee, primary  POST-OPERATIVE DIAGNOSIS:  Same  PROCEDURE:  Right total knee arthroplasty using computer-assisted navigation  SURGEON:  Marciano Sequin. M.D.  ASSISTANT:  Vance Peper, PA (present and scrubbed throughout the case, critical for assistance with exposure, retraction, instrumentation, and closure)  ANESTHESIA: general  ESTIMATED BLOOD LOSS: 50 mL  FLUIDS REPLACED: 1100 mL of crystalloid  TOURNIQUET TIME: 82 minutes  DRAINS: 2 medium Hemovac drains  SOFT TISSUE RELEASES: Anterior cruciate ligament, posterior cruciate ligament, deep medial collateral ligament, patellofemoral ligament  IMPLANTS UTILIZED: DePuy PFC Sigma size 3 posterior stabilized femoral component (cemented), size 3 MBT tibial component (cemented), 35 mm 3 peg oval dome patella (cemented), and a 10 mm stabilized rotating platform polyethylene insert.  INDICATIONS FOR SURGERY: Jocelyn Hooper is a 77 y.o. year old female with a long history of progressive knee pain. X-rays demonstrated severe degenerative changes in tricompartmental fashion. The patient had not seen any significant improvement despite conservative nonsurgical intervention. After discussion of the risks and benefits of surgical intervention, the patient expressed understanding of the risks benefits and agree with plans for total knee arthroplasty.   The risks, benefits, and alternatives were discussed at length including but not limited to the risks of infection, bleeding, nerve injury, stiffness, blood clots, the need for revision surgery, cardiopulmonary complications, among others, and they were willing to proceed.  PROCEDURE IN DETAIL: The patient was brought into the operating room and, after adequate general anesthesia was achieved, a tourniquet was placed on the  patient's upper thigh. The patient's knee and leg were cleaned and prepped with alcohol and DuraPrep and draped in the usual sterile fashion. A "timeout" was performed as per usual protocol. The lower extremity was exsanguinated using an Esmarch, and the tourniquet was inflated to 300 mmHg. An anterior longitudinal incision was made followed by a standard mid vastus approach. The deep fibers of the medial collateral ligament were elevated in a subperiosteal fashion off of the medial flare of the tibia so as to maintain a continuous soft tissue sleeve. The patella was subluxed laterally and the patellofemoral ligament was incised. Inspection of the knee demonstrated severe degenerative changes with full-thickness loss of articular cartilage. Osteophytes were debrided using a rongeur. Anterior and posterior cruciate ligaments were excised. Two 4.0 mm Schanz pins were inserted in the femur and into the tibia for attachment of the array of trackers used for computer-assisted navigation. Hip center was identified using a circumduction technique. Distal landmarks were mapped using the computer. The distal femur and proximal tibia were mapped using the computer. The distal femoral cutting guide was positioned using computer-assisted navigation so as to achieve a 5 distal valgus cut. The femur was sized and it was felt that a size 3 femoral component was appropriate. A size 3 femoral cutting guide was positioned and the anterior cut was performed and verified using the computer. This was followed by completion of the posterior and chamfer cuts. Femoral cutting guide for the central box was then positioned in the center box cut was performed.  Attention was then directed to the proximal tibia. Medial and lateral menisci were excised. The extramedullary tibial cutting guide was positioned using computer-assisted navigation so as to achieve a 0 varus-valgus alignment and 0 posterior slope. The cut was performed and  verified using the computer. The  proximal tibia was sized and it was felt that a size 3 tibial tray was appropriate. Tibial and femoral trials were inserted followed by insertion of a 10 mm polyethylene insert. This allowed for excellent mediolateral soft tissue balancing both in flexion and in full extension. Finally, the patella was cut and prepared so as to accommodate a 35 mm 3 peg oval dome patella. A patella trial was placed and the knee was placed through a range of motion with excellent patellar tracking appreciated. The femoral trial was removed after debridement of posterior osteophytes. The central post-hole for the tibial component was reamed followed by insertion of a keel punch. Tibial trials were then removed. Cut surfaces of bone were irrigated with copious amounts of normal saline with antibiotic solution using pulsatile lavage and then suctioned dry. Polymethylmethacrylate cement was prepared in the usual fashion using a vacuum mixer. Cement was applied to the cut surface of the proximal tibia as well as along the undersurface of a size 3 MBT tibial component. Tibial component was positioned and impacted into place. Excess cement was removed using [employer] fast streamer. Cement was then applied to the cut surfaces of the femur as well as along the posterior flanges of the size 3 femoral component. The femoral component was positioned and impacted into place. Excess cement was removed using [employer] fast streamer. A 10 mm polyethylene trial was inserted and the knee was brought into full extension with steady axial compression applied. Finally, cement was applied to the backside of a 35 mm 3 peg oval dome patella and the patellar component was positioned and patellar clamp applied. Excess cement was removed using [employer] fast streamer. After adequate curing of the cement, the tourniquet was deflated after a total tourniquet time of 82 minutes. Hemostasis was achieved using electrocautery. The knee was irrigated with  copious amounts of normal saline with antibiotic solution using pulsatile lavage and then suctioned dry. 20 mL of 1.3% Exparel and 60 mL of 0.25% Marcaine in 40 mL of normal saline was injected along the posterior capsule, medial and lateral gutters, and along the arthrotomy site. A 10 mm stabilized rotating platform polyethylene insert was inserted and the knee was placed through a range of motion with excellent mediolateral soft tissue balancing appreciated and excellent patellar tracking noted. 2 medium drains were placed in the wound bed and brought out through separate stab incisions. The medial parapatellar portion of the incision was reapproximated using interrupted sutures of #1 Vicryl. Subcutaneous tissue was approximated in layers using first #0 Vicryl followed #2-0 Vicryl. The skin was approximated with skin staples. A sterile dressing was applied.  The patient tolerated the procedure well and was transported to the recovery room in stable condition.    Macklin Jacquin P. Holley Bouche., M.D.

## 2017-09-27 NOTE — H&P (Signed)
The patient has been re-examined, and the chart reviewed, and there have been no interval changes to the documented history and physical.    The risks, benefits, and alternatives have been discussed at length. The patient expressed understanding of the risks benefits and agreed with plans for surgical intervention.  James P. Hooten, [name] M.D.    

## 2017-09-27 NOTE — Discharge Instructions (Signed)
°  Instructions after Total Knee Replacement ° ° Jocelyn Hooper P. [name], [name], M.D.    ° Dept. of Orthopaedics & Sports Medicine ° [facility] ° [address] ° [place], [place]  [address] ° Phone: [phone]   Fax: [phone] ° °  °DIET: °• Drink plenty of non-alcoholic fluids. °• Resume your normal diet. Include foods high in fiber. ° °ACTIVITY:  °• You may use crutches or a walker with weight-bearing as tolerated, unless instructed otherwise. °• You may be weaned off of the walker or crutches by your Physical Therapist.  °• Do NOT place pillows under the knee. Anything placed under the knee could limit your ability to straighten the knee.   °• Continue doing gentle exercises. Exercising will reduce the pain and swelling, increase motion, and prevent muscle weakness.   °• Please continue to use the TED compression stockings for 6 weeks. You may remove the stockings at night, but should reapply them in the morning. °• Do not drive or operate any equipment until instructed. ° °WOUND CARE:  °• Continue to use the PolarCare or ice packs periodically to reduce pain and swelling. °• You may bathe or shower after the staples are removed at the first office visit following surgery. ° °MEDICATIONS: °• You may resume your regular medications. °• Please take the pain medication as prescribed on the medication. °• Do not take pain medication on an empty stomach. °• You have been given a prescription for a blood thinner (Lovenox or Coumadin). Please take the medication as instructed. (NOTE: After completing a 2 week course of Lovenox, take one Enteric-coated aspirin once a day. This along with elevation will help reduce the possibility of phlebitis in your operated leg.) °• Do not drive or drink alcoholic beverages when taking pain medications. ° °CALL THE OFFICE FOR: °• Temperature above 101 degrees °• Excessive bleeding or drainage on the dressing. °• Excessive swelling, coldness, or paleness of the toes. °• Persistent  nausea and vomiting. ° °FOLLOW-UP:  °• You should have an appointment to return to the office in 10-14 days after surgery. °• Arrangements have been made for continuation of Physical Therapy (either home therapy or outpatient therapy). °  °

## 2017-09-27 NOTE — Evaluation (Signed)
Physical Therapy Evaluation Patient Details Name: Jocelyn Hooper MRN: [id number] DOB: 19-Jul-1941 Today's Date: 09/27/2017   History of Present Illness  admitted for acute hospitalization status post R TKR, WBAT (09/27/17).  Clinical Impression  Upon evaluation, patient alert and oriented; follows all commands and demonstrates good effort with all functional activities.  Demonstrates good post-op strength (3-/5) and ROM (3-98 degrees) in R LE; sensation fully intact.  Good R quad activation and control; able to control SLR with minimal lag/difficulty.  Able to complete bed mobility with cga/min assist; sit/stand, basic transfers and very short-distance gait (5' forward/backward) with RW, cga.  Good R LE WBing, stance time and knee control with minimal increase in pain reported.  Additional gait distance limited by nausea; RN informed/aware, meds administered. Would benefit from skilled PT to address above deficits and promote optimal return to PLOF; Recommend transition to [place] upon discharge from acute hospitalization.     Follow Up Recommendations Home health PT    Equipment Recommendations  (has RW from previous surgery)    Recommendations for Other Services       Precautions / Restrictions Precautions Precautions: Fall Restrictions Weight Bearing Restrictions: Yes RLE Weight Bearing: Weight bearing as tolerated      Mobility  Bed Mobility Overal bed mobility: Needs Assistance Bed Mobility: Supine to Sit;Sit to Supine     Supine to sit: Supervision Sit to supine: Min assist      Transfers Overall transfer level: Needs assistance Equipment used: Rolling walker (2 wheeled) Transfers: Sit to/from Stand Sit to Stand: Min guard         General transfer comment: cuing for hand placement, fair WBing and active use of R LE (minimal reports of pain)  Ambulation/Gait Ambulation/Gait assistance: Min guard Ambulation Distance (Feet): 5 Feet Assistive device: Rolling walker (2  wheeled)       General Gait Details: forward/backward stepping with RW, cga/min assist; 3-point, step to gait pattern with good stance time/WBing and knee control R LE.  Stairs            Wheelchair Mobility    Modified Rankin (Stroke Patients Only)       Balance Overall balance assessment: Needs assistance Sitting-balance support: No upper extremity supported;Feet supported Sitting balance-Leahy Scale: Good     Standing balance support: Bilateral upper extremity supported Standing balance-Leahy Scale: Fair                               Pertinent Vitals/Pain Pain Assessment: 0-10 Pain Score: 4  Pain Location: R knee Pain Descriptors / Indicators: Aching;Grimacing;Guarding Pain Intervention(s): Limited activity within patient's tolerance;Monitored during session;Repositioned;Premedicated before session    Home Living Family/patient expects to be discharged to:: Private residence Living Arrangements: Spouse/significant other Available Help at Discharge: Family Type of Home: House Home Access: Stairs to enter Entrance Stairs-Rails: Left Entrance Stairs-Number of Steps: 2 Home Layout: One level;Laundry or work area in basement;Able to live on main level with bedroom/bathroom Home Equipment: Environmental consultant - 2 wheels      Prior Function Level of Independence: Independent         Comments: Indep with ADLs, household and community mobilization without assist device; denies fall history; no home O2.     Hand Dominance        Extremity/Trunk Assessment   Upper Extremity Assessment Upper Extremity Assessment: Overall WFL for tasks assessed    Lower Extremity Assessment Lower Extremity Assessment: (R knee grossly 3-/5,  limited by pain; sensation fully intact.  L LE grossly 4+/5 throughout)       Communication   Communication: No difficulties  Cognition Arousal/Alertness: Awake/alert Behavior During Therapy: WFL for tasks  assessed/performed Overall Cognitive Status: Within Functional Limits for tasks assessed                                        General Comments      Exercises Total Joint Exercises Goniometric ROM: R knee: 3-98 degrees Other Exercises Other Exercises: Supine R LE therex, 1x10, AROM: ankle pumps, quad sets, SAQs, heel slides, hip abduct/adduct and SLR.  Good quad activation with mild/minimal lag noted during SLR.   Assessment/Plan    PT Assessment Patient needs continued PT services  PT Problem List Decreased strength;Decreased range of motion;Decreased activity tolerance;Decreased balance;Decreased mobility;Decreased coordination;Decreased knowledge of use of DME;Decreased safety awareness;Pain       PT Treatment Interventions DME instruction;Gait training;Functional mobility training;Therapeutic activities;Therapeutic exercise;Stair training;Balance training;Patient/family education    PT Goals (Current goals can be found in the Care Plan section)  Acute Rehab PT Goals Patient Stated Goal: to return home, to get something for this nausea PT Goal Formulation: With patient Time For Goal Achievement: 10/11/17 Potential to Achieve Goals: Good    Frequency BID   Barriers to discharge        Co-evaluation               AM-PAC PT "6 Clicks" Daily Activity  Outcome Measure Difficulty turning over in bed (including adjusting bedclothes, sheets and blankets)?: A Little Difficulty moving from lying on back to sitting on the side of the bed? : A Little Difficulty sitting down on and standing up from a chair with arms (e.g., wheelchair, bedside commode, etc,.)?: Unable Help needed moving to and from a bed to chair (including a wheelchair)?: A Little Help needed walking in hospital room?: A Little Help needed climbing 3-5 steps with a railing? : A Lot 6 Click Score: 15    End of Session Equipment Utilized During Treatment: Gait belt Activity Tolerance:  Patient tolerated treatment well Patient left: in bed;with call bell/phone within reach;with bed alarm set;with family/visitor present Nurse Communication: Mobility status PT Visit Diagnosis: Muscle weakness (generalized) (M62.81);Difficulty in walking, not elsewhere classified (R26.2);Pain Pain - Right/Left: Right Pain - part of body: Knee    Time: [id number] PT Time Calculation (min) (ACUTE ONLY): 42 min   Charges:   PT Evaluation $PT Eval Low Complexity: 1 Low PT Treatments $Therapeutic Exercise: 8-22 mins $Therapeutic Activity: 8-22 mins   PT G Codes:        Tessa Seaberry H. Owens Shark, PT, DPT, NCS 09/27/17, 4:10 PM [phone]

## 2017-09-27 NOTE — Clinical Social Work Note (Signed)
CSW received referral for SNF.  Case discussed with case manager and plan is to discharge home with home health.  CSW to sign off please re-consult if social work needs arise.  Jeiden Daughtridge R. Shakedra Beam, MSW, LCSWA [phone]  

## 2017-09-28 ENCOUNTER — Encounter: Payer: Self-pay | Admitting: Orthopedic Surgery

## 2017-09-28 MED ORDER — OXYCODONE HCL 5 MG PO TABS: 5.0000 mg | ORAL_TABLET | ORAL | 0 refills | Status: DC | PRN

## 2017-09-28 MED ORDER — ENOXAPARIN SODIUM 40 MG/0.4ML ~~LOC~~ SOLN: 40.0000 mg | SUBCUTANEOUS | 0 refills | Status: DC

## 2017-09-28 MED ORDER — TRAMADOL HCL 50 MG PO TABS: 50.0000 mg | ORAL_TABLET | ORAL | 0 refills | Status: DC | PRN

## 2017-09-28 NOTE — Progress Notes (Signed)
   Subjective: 1 Day Post-Op Procedure(s) (LRB): COMPUTER ASSISTED TOTAL KNEE ARTHROPLASTY (Right) Patient reports pain as mild and beginning to hurt a little more..  Taking Tylenol for pain. Apprehensive about oxycodone secondary to making her nauseated. Patient is well, and has had no acute complaints or problems Patient did well with physical therapy yesterday. Has great range of motion. Plan is to go Home after hospital stay. no nausea and no vomiting but was noted to be extremely nauseated yesterday after surgery. Patient denies any chest pains or shortness of breath. Objective: Vital signs in last 24 hours: Temp:  [97.6 F (36.4 C)-99.2 F (37.3 C)] 98.8 F (37.1 C) (01/15 0346) Pulse Rate:  [67-98] 71 (01/15 0346) Resp:  [7-18] 18 (01/15 0346) BP: (106-142)/(53-82) 107/56 (01/15 0346) SpO2:  [94 %-100 %] 97 % (01/15 0346) Weight:  [70.3 kg (155 lb 0 oz)] 70.3 kg (155 lb 0 oz) (01/14 1204) Heels are non tender and elevated off the bed using rolled towels along with bone foam under operative leg  Intake/Output from previous day: 01/14 0701 - 01/15 0700 In: 1520 [P.O.:120; I.V.:1100; IV Piggyback:300] Out: 8546 [Urine:1145; Drains:110; Blood:50] Intake/Output this shift: No intake/output data recorded.  No results for input(s): HGB in the last 72 hours. No results for input(s): WBC, RBC, HCT, PLT in the last 72 hours. No results for input(s): NA, K, CL, CO2, BUN, CREATININE, GLUCOSE, CALCIUM in the last 72 hours. No results for input(s): LABPT, INR in the last 72 hours.  EXAM General - Patient is Alert, Appropriate and Oriented Extremity - Neurologically intact Neurovascular intact Sensation intact distally Intact pulses distally Dorsiflexion/Plantar flexion intact Compartment soft Dressing - dressing C/D/I Motor Function - intact, moving foot and toes well on exam.    Past Medical History:  Diagnosis Date  . Bursitis of left shoulder   . Chest pain, unspecified    . Chronic kidney disease    KIDNEY STONE CURRENTLY-08-2015-SEES DR COPE  . Complication of anesthesia    PT HAS SEVERE SCOLIOSIS AND FOR TKR ANESTHESIA COULD NOT GET A SPINAL  . Coronary artery disease    ascvd  . CSF leak   . DDD (degenerative disc disease), cervical   . Diverticulosis   . Esophageal reflux   . Fibrocystic breast disease   . GERD (gastroesophageal reflux disease)   . Headache    H/O  . History of colon polyps   . History of hiatal hernia   . History of kidney stones   . IBS (irritable bowel syndrome)   . Nasal polyps   . Other and unspecified hyperlipidemia   . Scoliosis   . Tinnitus of both ears   . Trigeminal neuralgia syndrome   . Unspecified sinusitis (chronic)   . Vitamin B12 deficiency     Assessment/Plan: 1 Day Post-Op Procedure(s) (LRB): COMPUTER ASSISTED TOTAL KNEE ARTHROPLASTY (Right) Active Problems:   S/P total knee arthroplasty  Estimated body mass index is 25.4 kg/m as calculated from the following:   Height as of this encounter: 5' 5.5" (1.664 m).   Weight as of this encounter: 70.3 kg (155 lb 0 oz). Advance diet Up with therapy D/C IV fluids Plan for discharge tomorrow Discharge home with home health  Labs: None DVT Prophylaxis - Lovenox, Foot Pumps and TED hose Weight-Bearing as tolerated to right leg D/C O2 and Pulse OX and try on Room Air Begin working on bowel movement  Gates Jividen R. [place] 09/28/2017, 7:37 AM

## 2017-09-28 NOTE — Progress Notes (Signed)
Clinical Social Worker (CSW) received SNF consult. PT is recommending home health. RN case manager aware of above. Please reconsult if future social work needs arise. CSW signing off.   Belia Febo, LCSW [phone] 

## 2017-09-28 NOTE — Care Management Note (Signed)
Case Management Note  Patient Details  Name: Jocelyn Hooper MRN: [id number] Date of Birth: November 10, 1940  Subjective/Objective:                  Spoke with patient regarding transition of care. She plans to return home with her husband at discharge. She had no preference for home health agencies and cannot recall name of previous agency used in the past.  She wishes to go with [facility] as they have pre-referral from Dr. Clydell Hakim office. She has a front wheeled walker available at home. She uses [facility] [phone] for medications. [facility] pharmacy had already received e-script and medication will cost patient 90$.  Action/Plan:   Referral to [facility]. Patient aware and agrees on cost of Lovenox (90$).   Expected Discharge Date:  09/29/17               Expected Discharge Plan:     In-House Referral:     Discharge planning Services  CM Consult  Post Acute Care Choice:  Home Health Choice offered to:  Patient  DME Arranged:    DME Agency:     HH Arranged:  PT HH Agency:  [facility]  Status of Service:  In process, will continue to follow  If discussed at Long Length of Stay Meetings, dates discussed:    Additional Comments:  Marshell Garfinkel, RN 09/28/2017, 11:03 AM

## 2017-09-28 NOTE — Discharge Summary (Signed)
Physician Discharge Summary  Patient ID: Jocelyn Hooper MRN: [id number] DOB/AGE: Jun 08, 1941 77 y.o.  Admit date: 09/27/2017 Discharge date: 09/29/2017  Admission Diagnoses:  primary osteoarthritis of right knee   Discharge Diagnoses: Patient Active Problem List   Diagnosis Date Noted  . S/P total knee arthroplasty 09/27/2017  . ASCVD (arteriosclerotic cardiovascular disease) 10/15/2016  . Esophagitis, reflux 10/15/2016  . GERD (gastroesophageal reflux disease) 10/07/2016  . Acute renal insufficiency 10/07/2016  . Thrombocytopenia ([place]) 10/07/2016  . Rectocele 05/28/2016  . Post-herpetic polyneuropathy 05/17/2016  . Bladder outlet obstruction 05/15/2016  . Internal derangement of right knee 09/13/2015  . Status post total left knee replacement 09/09/2015  . B12 deficiency 12/28/2014  . Trigeminal neuralgia syndrome 12/28/2014  . Thoracogenic scoliosis of thoracic region 12/28/2014  . DDD (degenerative disc disease), lumbar 05/09/2014  . Kidney stone 06/01/2012  . Renal colic 72/53/6644  . Sciatica 06/01/2012  . EDEMA 10/18/2009  . Hyperlipidemia 02/20/2009  . SINUSITIS, CHRONIC 02/20/2009  . ASTHMA 02/20/2009  . GERD 02/20/2009  . Exertional shortness of breath 02/20/2009  . Chest pain with moderate risk for cardiac etiology 02/20/2009    Past Medical History:  Diagnosis Date  . Bursitis of left shoulder   . Chest pain, unspecified   . Chronic kidney disease    KIDNEY STONE CURRENTLY-08-2015-SEES DR COPE  . Complication of anesthesia    PT HAS SEVERE SCOLIOSIS AND FOR TKR ANESTHESIA COULD NOT GET A SPINAL  . Coronary artery disease    ascvd  . CSF leak   . DDD (degenerative disc disease), cervical   . Diverticulosis   . Esophageal reflux   . Fibrocystic breast disease   . GERD (gastroesophageal reflux disease)   . Headache    H/O  . History of colon polyps   . History of hiatal hernia   . History of kidney stones   . IBS (irritable bowel syndrome)   . Nasal  polyps   . Other and unspecified hyperlipidemia   . Scoliosis   . Tinnitus of both ears   . Trigeminal neuralgia syndrome   . Unspecified sinusitis (chronic)   . Vitamin B12 deficiency      Transfusion: No transfusions during this admission   Consultants (if any):   Discharged Condition: Improved  Hospital Course: [name] is an 77 y.o. female who was admitted 09/27/2017 with a diagnosis of degenerative arthrosis right knee and went to the operating room on 09/27/2017 and underwent the above named procedures.    Surgeries:Procedure(s): COMPUTER ASSISTED TOTAL KNEE ARTHROPLASTY on 09/27/2017   PRE-OPERATIVE DIAGNOSIS: Degenerative arthrosis of the right knee, primary  POST-OPERATIVE DIAGNOSIS:  Same  PROCEDURE:  Right total knee arthroplasty using computer-assisted navigation  SURGEON:  Marciano Sequin. M.D.  ASSISTANT:  Vance Peper, PA (present and scrubbed throughout the case, critical for assistance with exposure, retraction, instrumentation, and closure)  ANESTHESIA: general  ESTIMATED BLOOD LOSS: 50 mL  FLUIDS REPLACED: 1100 mL of crystalloid  TOURNIQUET TIME: 82 minutes  DRAINS: 2 medium Hemovac drains  SOFT TISSUE RELEASES: Anterior cruciate ligament, posterior cruciate ligament, deep medial collateral ligament, patellofemoral ligament  IMPLANTS UTILIZED: DePuy PFC Sigma size 3 posterior stabilized femoral component (cemented), size 3 MBT tibial component (cemented), 35 mm 3 peg oval dome patella (cemented), and a 10 mm stabilized rotating platform polyethylene insert.  INDICATIONS FOR SURGERY: Jocelyn Hooper is a 77 y.o. year old female with a long history of progressive knee pain. X-rays demonstrated severe degenerative changes in  tricompartmental fashion. The patient had not seen any significant improvement despite conservative nonsurgical intervention. After discussion of the risks and benefits of surgical intervention, the patient expressed  understanding of the risks benefits and agree with plans for total knee arthroplasty.   The risks, benefits, and alternatives were discussed at length including but not limited to the risks of infection, bleeding, nerve injury, stiffness, blood clots, the need for revision surgery, cardiopulmonary complications, among others, and they were willing to proceed.   Patient tolerated the surgery well. No complications .Patient was taken to PACU where she was stabilized and then transferred to the orthopedic floor.  Patient started on Lovenox 30 mg q 12 hrs. Foot pumps applied bilaterally at 80 mm hgb. Heels elevated off bed with rolled towels. No evidence of DVT. Calves non tender. Negative [place]oman. Physical therapy started on day #1 for gait training and transfer with OT starting on  day #1 for ADL and assisted devices. Patient has done well with therapy. Ambulated greater than 200 feet upon being discharged. Was able to ascend and descend 4 steps safely and independently  Patient's IV And Foley were discontinued on day #1 with Hemovac being discontinued on day #2. Dressing was changed on day 2 prior to patient being discharged   She was given perioperative antibiotics:  Anti-infectives (From admission, onward)   Start     Dose/Rate Route Frequency Ordered Stop   09/27/17 1400  ceFAZolin (ANCEF) IVPB 2g/100 mL premix     2 g 200 mL/hr over 30 Minutes Intravenous Every 6 hours 09/27/17 1154 09/28/17 1359   09/27/17 0634  ceFAZolin (ANCEF) 2-4 GM/100ML-% IVPB    Comments:  Phineas Real   : cabinet override      09/27/17 0634 09/27/17 1844   09/27/17 0600  ceFAZolin (ANCEF) IVPB 2g/100 mL premix  Status:  Discontinued     2 g 200 mL/hr over 30 Minutes Intravenous On call to O.R. 09/26/17 2158 09/27/17 1610    .  She was fitted with AV 1 compression foot pump devices, instructed on heel pumps, early ambulation, and fitted with TED stockings bilaterally for DVT prophylaxis.  She benefited  maximally from the hospital stay and there were no complications.    Recent vital signs:  Vitals:   09/28/17 0346 09/28/17 0740  BP: (!) 107/56 (!) 108/49  Pulse: 71 74  Resp: 18 16  Temp: 98.8 F (37.1 C) 98.2 F (36.8 C)  SpO2: 97% 95%    Recent laboratory studies:  Lab Results  Component Value Date   HGB 14.1 09/16/2017   HGB 13.3 10/06/2016   HGB 10.9 (L) 09/14/2012   Lab Results  Component Value Date   WBC 6.5 09/16/2017   PLT 247 09/16/2017   Lab Results  Component Value Date   INR 0.93 09/16/2017   Lab Results  Component Value Date   NA 141 09/16/2017   K 3.9 09/16/2017   CL 110 09/16/2017   CO2 23 09/16/2017   BUN 19 09/16/2017   CREATININE 0.99 09/16/2017   GLUCOSE 72 09/16/2017    Discharge Medications:   Allergies as of 09/28/2017      Reactions   Avelox [moxifloxacin Hcl In Nacl] Other (See Comments)   Redness all over in about 5 minutes after taking   Codeine Nausea Only   REACTION: nausea   Acyclovir And Related    Shrimp [shellfish Allergy]    Hives    Ciprofloxacin Rash   Moxifloxacin Hives, Rash  Other reaction(s): OTHER      Medication List    STOP taking these medications   ASPIR-81 81 MG EC tablet Generic drug:  aspirin     TAKE these medications   AMOXIL PO Take 2,000 mg by mouth See admin instructions. Take 2000 mg 1 hour prior to dental procedures   CALTRATE 600+D 600-400 MG-UNIT tablet Generic drug:  Calcium Carbonate-Vitamin D Take 1 tablet by mouth 2 (two) times daily.   diphenhydrAMINE 25 MG tablet Commonly known as:  BENADRYL Take 25 mg by mouth at bedtime as needed.   enoxaparin 40 MG/0.4ML injection Commonly known as:  LOVENOX Inject 0.4 mLs (40 mg total) into the skin daily for 14 days.   estradiol 1 MG tablet Commonly known as:  ESTRACE Take 0.5 mg by mouth daily.   fluticasone 50 MCG/ACT nasal spray Commonly known as:  FLONASE Place 2 sprays into both nostrils daily.   gabapentin 300 MG  capsule Commonly known as:  NEURONTIN Take 300 mg by mouth at bedtime.   lansoprazole 30 MG capsule Commonly known as:  PREVACID Take 30 mg by mouth at bedtime.   loratadine 10 MG tablet Commonly known as:  CLARITIN Take 10 mg by mouth daily as needed for allergies.   magnesium oxide 400 MG tablet Commonly known as:  MAG-OX Take 1 tablet by mouth at bedtime.   Melatonin 10 MG Tabs Take 10 mg by mouth at bedtime.   metoprolol tartrate 25 MG tablet Commonly known as:  LOPRESSOR Take 0.5 tablets (12.5 mg total) by mouth 2 (two) times daily.   oxyCODONE 5 MG immediate release tablet Commonly known as:  Oxy IR/ROXICODONE Take 1 tablet (5 mg total) by mouth every 3 (three) hours as needed for moderate pain ((score 4 to 6)).   STOOL SOFTENER PO Take 1 tablet by mouth daily.   TOPAMAX 100 MG tablet Generic drug:  topiramate Take 100 mg by mouth at bedtime.   traMADol 50 MG tablet Commonly known as:  ULTRAM Take 1-2 tablets (50-100 mg total) by mouth every 4 (four) hours as needed for moderate pain.   vitamin C 500 MG tablet Commonly known as:  ASCORBIC ACID Take 1,000 mg by mouth 2 (two) times daily.            Durable Medical Equipment  (From admission, onward)        Start     Ordered   09/27/17 1154  DME Walker rolling  Once    Question:  Patient needs a walker to treat with the following condition  Answer:  Total knee replacement status   09/27/17 1154   09/27/17 1154  DME Bedside commode  Once    Question:  Patient needs a bedside commode to treat with the following condition  Answer:  Total knee replacement status   09/27/17 1154      Diagnostic Studies: Dg Knee Right Port  Result Date: 09/27/2017 CLINICAL DATA:  Postop total knee replacement. EXAM: PORTABLE RIGHT KNEE - 1-2 VIEW COMPARISON:  None. FINDINGS: Total knee replacement with suprapatellar drain. Components appear well positioned. No radiographically detectable complication. IMPRESSION: Good  appearance following total knee replacement. Electronically Signed   By: Nelson Chimes M.D.   On: 09/27/2017 11:11    Disposition: 01-Home or Self Care  Discharge Instructions    Increase activity slowly   Complete by:  As directed       Follow-up Information    Watt Climes, PA On 10/12/2017.  Specialty:  Physician Assistant Why:  at 1:15pm Contact information: [place] [address] [phone]        Dereck Leep, MD On 11/09/2017.   Specialty:  Orthopedic Surgery Why:  at 10:00am Contact information: [place] [address] [phone]            Signed: Watt Climes 09/28/2017, 7:43 AM

## 2017-09-28 NOTE — Progress Notes (Signed)
Physical Therapy Treatment Patient Details Name: Jocelyn Hooper MRN: [id number] DOB: 1941/01/17 Today's Date: 09/28/2017    History of Present Illness 77yo female pt admitted for acute hospitalization status post R TKR, WBAT (09/27/17). Previous L TKR.    PT Comments    Pt agreeable to PT; reports 5/10 pain in R knee. Also notes some mild nausea persisting. Pt demonstrates progressing ambulation distance as well as stair climbing with Min guard to supervision. Requires supervision for STS transfers from various surfaces with cues for increased use of Right lower extremity for strengthening and stretching. Encouraged performance of R knee flex/ext stretching activities throughout the day. Continue PT to progress strength, endurance and R knee range for improve functional mobility to all for an optimal, safe return home.    Follow Up Recommendations  Home health PT     Equipment Recommendations  Rolling walker with 5" wheels    Recommendations for Other Services       Precautions / Restrictions Precautions Precautions: Fall;Knee Restrictions Weight Bearing Restrictions: Yes RLE Weight Bearing: Weight bearing as tolerated    Mobility  Bed Mobility Overal bed mobility: Needs Assistance Bed Mobility: Supine to Sit;Sit to Supine     Supine to sit: Min assist Sit to supine: Modified independent (Device/Increase time)   General bed mobility comments: for LEs   Transfers Overall transfer level: Needs assistance Equipment used: Rolling walker (2 wheeled) Transfers: Sit to/from Stand Sit to Stand: Supervision         General transfer comment: cues for use of RLE and stand QS before gait; Performed from bed/commode  Ambulation/Gait Ambulation/Gait assistance: Min guard;Supervision Ambulation Distance (Feet): 270 Feet Assistive device: Rolling walker (2 wheeled) Gait Pattern/deviations: Step-through pattern   Gait velocity interpretation: Below normal speed for  age/gender General Gait Details: rolling walker lowered for appropriate height. No safety concerns. Cues for QS on R with stance phase   Stairs Stairs: Yes   Stair Management: One rail Left;Step to pattern;Forwards      Wheelchair Mobility    Modified Rankin (Stroke Patients Only)       Balance Overall balance assessment: Needs assistance         Standing balance support: Bilateral upper extremity supported Standing balance-Leahy Scale: Fair                              Cognition Arousal/Alertness: Awake/alert(But tired) Behavior During Therapy: WFL for tasks assessed/performed Overall Cognitive Status: Within Functional Limits for tasks assessed                                        Exercises Total Joint Exercises Quad Sets: Strengthening;Both;20 reps;Supine(also in stand) Knee Flexion: AROM;Right;10 reps;Seated(3 positions 10 sec hold each position) Other Exercises Other Exercises: Set up/distant supervision with toileting    General Comments        Pertinent Vitals/Pain Pain Assessment: 0-10 Pain Location: R knee Pain Intervention(s): Limited activity within patient's tolerance;Monitored during session;Ice applied    Home Living                      Prior Function            PT Goals (current goals can now be found in the care plan section) Progress towards PT goals: Progressing toward goals    Frequency  BID      PT Plan Current plan remains appropriate    Co-evaluation              AM-PAC PT "6 Clicks" Daily Activity  Outcome Measure  Difficulty turning over in bed (including adjusting bedclothes, sheets and blankets)?: A Little Difficulty moving from lying on back to sitting on the side of the bed? : A Little Difficulty sitting down on and standing up from a chair with arms (e.g., wheelchair, bedside commode, etc,.)?: Unable Help needed moving to and from a bed to chair (including a  wheelchair)?: A Little Help needed walking in hospital room?: A Little Help needed climbing 3-5 steps with a railing? : A Little 6 Click Score: 16    End of Session Equipment Utilized During Treatment: Gait belt Activity Tolerance: Patient tolerated treatment well Patient left: in bed;with call bell/phone within reach;with bed alarm set   PT Visit Diagnosis: Muscle weakness (generalized) (M62.81);Difficulty in walking, not elsewhere classified (R26.2);Pain Pain - Right/Left: Right Pain - part of body: Knee     Time: 1450-1541 PT Time Calculation (min) (ACUTE ONLY): 51 min  Charges:  $Gait Training: 23-37 mins $Therapeutic Exercise: 8-22 mins                    G Codes:        Larae Grooms, PTA 09/28/2017, 3:52 PM

## 2017-09-28 NOTE — Evaluation (Signed)
Occupational Therapy Evaluation Patient Details Name: Jocelyn Hooper MRN: [id number] DOB: 01/25/1941 Today's Date: 09/28/2017    History of Present Illness 77yo female pt admitted for acute hospitalization status post R TKR, WBAT (09/27/17). Previous L TKR.   Clinical Impression   Pt seen for OT evaluation this date, POD #1 from R TKR. Pt independent at baseline and eager to return to PLOF. Pt presents with pain, deficits in strength/ROM in RLE, and impaired balance impacting safety and independence with functional mobility and self care tasks. Pt educated in polar care mgt, compression stocking mgt, AE for LB dressing, and falls prevention strategies. Pt will benefit from skilled OT services while in the hospital to maximize optimal return to PLOF and minimize risk of falls. Do not anticipate follow up OT needs after hospitalization.     Follow Up Recommendations  No OT follow up    Equipment Recommendations  None recommended by OT    Recommendations for Other Services       Precautions / Restrictions Precautions Precautions: Fall;Knee Precaution Booklet Issued: No Restrictions Weight Bearing Restrictions: No RLE Weight Bearing: Weight bearing as tolerated      Mobility Bed Mobility Overal bed mobility: Needs Assistance Bed Mobility: Supine to Sit     Supine to sit: Supervision        Transfers Overall transfer level: Needs assistance Equipment used: Rolling walker (2 wheeled) Transfers: Sit to/from Stand Sit to Stand: Min guard         General transfer comment: cuing for hand placement to maximize safety    Balance Overall balance assessment: Needs assistance Sitting-balance support: No upper extremity supported;Feet supported Sitting balance-Leahy Scale: Good     Standing balance support: Bilateral upper extremity supported Standing balance-Leahy Scale: Fair                             ADL either performed or assessed with clinical judgement    ADL Overall ADL's : Needs assistance/impaired Eating/Feeding: Independent   Grooming: Standing;Min guard   Upper Body Bathing: Sitting;Set up   Lower Body Bathing: Sit to/from stand;Min guard   Upper Body Dressing : Sitting;Set up   Lower Body Dressing: Sit to/from stand;Minimal assistance Lower Body Dressing Details (indicate cue type and reason): pt educated in AE for LB dressing and compression stocking mgt strategies for donning/doffing Toilet Transfer: RW;Comfort height toilet;Ambulation;Min guard         Tub/Shower Transfer Details (indicate cue type and reason): encouraged to utilize shower chair that she can borrow from a friend for initial seated shower once cleared by surgeon for showering to support energy conservation and safety Functional mobility during ADLs: Min guard;Rolling walker       Vision Baseline Vision/History: No visual deficits Patient Visual Report: No change from baseline       Perception     Praxis      Pertinent Vitals/Pain Pain Assessment: 0-10 Pain Score: 6  Pain Location: R knee and 5/10 headache Pain Descriptors / Indicators: Aching;Grimacing;Guarding;Headache Pain Intervention(s): Limited activity within patient's tolerance;Monitored during session;RN gave pain meds during session;Ice applied;Repositioned     Hand Dominance Right   Extremity/Trunk Assessment Upper Extremity Assessment Upper Extremity Assessment: Overall WFL for tasks assessed   Lower Extremity Assessment Lower Extremity Assessment: Defer to PT evaluation;RLE deficits/detail(LLE 4+/5) RLE Deficits / Details: R knee 3-/5, pain, intact sensation   Cervical / Trunk Assessment Cervical / Trunk Assessment: Normal   Communication Communication  Communication: No difficulties   Cognition Arousal/Alertness: Awake/alert Behavior During Therapy: WFL for tasks assessed/performed Overall Cognitive Status: Within Functional Limits for tasks assessed                                      General Comments       Exercises Other Exercises Other Exercises: pt educated in polar care mgt strategies and falls prevention strategies to support safe recovery in the home and maximize functional independence   Shoulder Instructions      Home Living Family/patient expects to be discharged to:: Private residence Living Arrangements: Spouse/significant other Available Help at Discharge: Family Type of Home: House Home Access: Stairs to enter [employer] of Steps: 2 Entrance Stairs-Rails: Left Home Layout: One level;Laundry or work area in basement;Able to live on main level with bedroom/bathroom     Bathroom Shower/Tub: Occupational psychologist: Handicapped height     Home Equipment: Environmental consultant - 2 wheels;Adaptive equipment Adaptive Equipment: Reacher        Prior Functioning/Environment Level of Independence: Independent        Comments: Indep with ADLs, household and community mobilization without assist device; denies fall history; no home O2.        OT Problem List: Decreased strength;Decreased knowledge of use of DME or AE;Decreased range of motion;Impaired balance (sitting and/or standing);Pain      OT Treatment/Interventions: Self-care/ADL training;Therapeutic exercise;Therapeutic activities;Energy conservation;DME and/or AE instruction;Patient/family education    OT Goals(Current goals can be found in the care plan section) Acute Rehab OT Goals Patient Stated Goal: return to PLOF OT Goal Formulation: With patient Time For Goal Achievement: 10/12/17 Potential to Achieve Goals: Good  OT Frequency: Min 2X/week   Barriers to D/C:            Co-evaluation              AM-PAC PT "6 Clicks" Daily Activity     Outcome Measure Help from another person eating meals?: None Help from another person taking care of personal grooming?: None Help from another person toileting, which includes using toliet,  bedpan, or urinal?: A Little Help from another person bathing (including washing, rinsing, drying)?: A Little Help from another person to put on and taking off regular upper body clothing?: None Help from another person to put on and taking off regular lower body clothing?: A Little 6 Click Score: 21   End of Session Equipment Utilized During Treatment: Gait belt;Rolling walker  Activity Tolerance: Patient tolerated treatment well Patient left: in chair;with call bell/phone within reach;with chair alarm set;with nursing/sitter in room;with SCD's reapplied;Other (comment)(polar care in place)  OT Visit Diagnosis: Other abnormalities of gait and mobility (R26.89);Pain;Muscle weakness (generalized) (M62.81) Pain - Right/Left: Right Pain - part of body: Knee                Time: 2297-9892 OT Time Calculation (min): 24 min Charges:  OT General Charges $OT Visit: 1 Visit OT Evaluation $OT Eval Low Complexity: 1 Low OT Treatments $Self Care/Home Management : 8-22 mins  Jeni Salles, MPH, MS, OTR/L ascom [phone] 09/28/17, 9:31 AM

## 2017-09-28 NOTE — Progress Notes (Signed)
Physical Therapy Treatment Patient Details Name: Jocelyn Hooper MRN: [id number] DOB: 1940-12-08 Today's Date: 09/28/2017    History of Present Illness 77yo female pt admitted for acute hospitalization status post R TKR, WBAT (09/27/17). Previous L TKR.    PT Comments    Progressive increase in gait distance with good stance time, weight acceptance and control of R LE throughout gait cycle.  Slow and guarded, but no overt buckling, LOB or safety concerns.  Good awareness and insight into deficits and need for assist. Persistent nausea remains primary limiting factor; RN informed/aware and at bedside for medication administration as appropriate.    Follow Up Recommendations  Home health PT     Equipment Recommendations  Rolling walker with 5" wheels    Recommendations for Other Services       Precautions / Restrictions Precautions Precautions: Fall;Knee Precaution Booklet Issued: No Restrictions Weight Bearing Restrictions: Yes RLE Weight Bearing: Weight bearing as tolerated    Mobility  Bed Mobility Overal bed mobility: Modified Independent Bed Mobility: Supine to Sit;Sit to Supine     Supine to sit: Supervision        Transfers Overall transfer level: Needs assistance Equipment used: Rolling walker (2 wheeled) Transfers: Sit to/from Stand Sit to Stand: Min guard         General transfer comment: cuing for hand placement; less active use/WBing R LE today (due to increased pain/soreness)  Ambulation/Gait Ambulation/Gait assistance: Min guard Ambulation Distance (Feet): 75 Feet Assistive device: Rolling walker (2 wheeled)       General Gait Details: step to progressing to step through gait pattern with good stance time/weight acceptance to R LE; slow and guarded, but no buckling, LOB or safety concerns. Min cuing for postural extension as fatigue increased, but good efforts to incorporate all cuing   Stairs            Wheelchair Mobility    Modified  Rankin (Stroke Patients Only)       Balance Overall balance assessment: Needs assistance Sitting-balance support: No upper extremity supported;Feet supported Sitting balance-Leahy Scale: Normal     Standing balance support: Bilateral upper extremity supported Standing balance-Leahy Scale: Fair                              Cognition Arousal/Alertness: Awake/alert Behavior During Therapy: WFL for tasks assessed/performed Overall Cognitive Status: Within Functional Limits for tasks assessed                                        Exercises Total Joint Exercises Goniometric ROM: R knee: 2-92 degrees Other Exercises Other Exercises: Supine LE therex, 1x15, AROM: ankle pumps, quad sets, SAQs, heel slides, SLR.  Very fluid movement with good quad activation, control throughout all therex Other Exercises: Toilet transfer, ambulatory with RW, cga/close sup; sit/stand from [facility] with RW, cga/clsoe sup; standing balance at sink for hand hygiene, close sup.  Functional reach approx 3-4" from immediate BOS; good awareness of limits of stability and overall safety needs.    General Comments        Pertinent Vitals/Pain Pain Assessment: 0-10 Pain Score: 5  Pain Location: R knee Pain Descriptors / Indicators: Aching;Grimacing;Guarding Pain Intervention(s): Limited activity within patient's tolerance;Monitored during session;Repositioned    Home Living Family/patient expects to be discharged to:: Private residence Living Arrangements: Spouse/significant other Available Help at  Discharge: Family Type of Home: House Home Access: Stairs to enter Entrance Stairs-Rails: Left Home Layout: One level;Laundry or work area in basement;Able to live on main level with bedroom/bathroom Home Equipment: Environmental consultant - 2 wheels;Adaptive equipment      Prior Function Level of Independence: Independent      Comments: Indep with ADLs, household and community mobilization without  assist device; denies fall history; no home O2.   PT Goals (current goals can now be found in the care plan section) Acute Rehab PT Goals Patient Stated Goal: return to PLOF PT Goal Formulation: With patient Time For Goal Achievement: 10/11/17 Potential to Achieve Goals: Good Progress towards PT goals: Progressing toward goals    Frequency    BID      PT Plan Current plan remains appropriate    Co-evaluation              AM-PAC PT "6 Clicks" Daily Activity  Outcome Measure  Difficulty turning over in bed (including adjusting bedclothes, sheets and blankets)?: A Little Difficulty moving from lying on back to sitting on the side of the bed? : A Little Difficulty sitting down on and standing up from a chair with arms (e.g., wheelchair, bedside commode, etc,.)?: Unable Help needed moving to and from a bed to chair (including a wheelchair)?: A Little Help needed walking in hospital room?: A Little Help needed climbing 3-5 steps with a railing? : A Little 6 Click Score: 16    End of Session Equipment Utilized During Treatment: Gait belt Activity Tolerance: Patient tolerated treatment well Patient left: in bed;with call bell/phone within reach;with bed alarm set;with family/visitor present;with nursing/sitter in room Nurse Communication: Mobility status PT Visit Diagnosis: Muscle weakness (generalized) (M62.81);Difficulty in walking, not elsewhere classified (R26.2);Pain Pain - Right/Left: Right Pain - part of body: Knee     Time: 0931-1000 PT Time Calculation (min) (ACUTE ONLY): 29 min  Charges:  $Gait Training: 8-22 mins $Therapeutic Exercise: 8-22 mins                    G Codes:       Santosha Jividen H. Owens Shark, PT, DPT, NCS 09/28/17, 10:07 AM [phone]

## 2017-09-28 NOTE — NC FL2 (Signed)
[place] LEVEL OF CARE SCREENING TOOL     IDENTIFICATION  Patient Name: Jocelyn Hooper Birthdate: 15-Jul-1941 Sex: female Admission Date (Current Location): 09/27/2017  [place] and [place] Number:  Engineering geologist and Address:  [facility], [address] [place], [place] [address]      Provider Number: [id number]  Attending Physician Name and Address:  Dereck Leep, MD  Relative Name and Phone Number:       Current Level of Care: Hospital Recommended Level of Care: [place] Prior Approval Number:    Date Approved/Denied:   PASRR Number: [id number])  Discharge Plan: SNF    Current Diagnoses: Patient Active Problem List   Diagnosis Date Noted  . S/P total knee arthroplasty 09/27/2017  . ASCVD (arteriosclerotic cardiovascular disease) 10/15/2016  . Esophagitis, reflux 10/15/2016  . GERD (gastroesophageal reflux disease) 10/07/2016  . Acute renal insufficiency 10/07/2016  . Thrombocytopenia ([place]) 10/07/2016  . Rectocele 05/28/2016  . Post-herpetic polyneuropathy 05/17/2016  . Bladder outlet obstruction 05/15/2016  . Internal derangement of right knee 09/13/2015  . Status post total left knee replacement 09/09/2015  . B12 deficiency 12/28/2014  . Trigeminal neuralgia syndrome 12/28/2014  . Thoracogenic scoliosis of thoracic region 12/28/2014  . DDD (degenerative disc disease), lumbar 05/09/2014  . Kidney stone 06/01/2012  . Renal colic 85/88/5027  . Sciatica 06/01/2012  . EDEMA 10/18/2009  . Hyperlipidemia 02/20/2009  . SINUSITIS, CHRONIC 02/20/2009  . ASTHMA 02/20/2009  . GERD 02/20/2009  . Exertional shortness of breath 02/20/2009  . Chest pain with moderate risk for cardiac etiology 02/20/2009    Orientation RESPIRATION BLADDER Height & Weight     Self, Time, Situation, Place  Normal Continent Weight: 155 lb 0 oz (70.3 kg) Height:  5' 5.5" (166.4 cm)  BEHAVIORAL SYMPTOMS/MOOD NEUROLOGICAL  BOWEL NUTRITION STATUS      Continent Diet(Regular)  AMBULATORY STATUS COMMUNICATION OF NEEDS Skin   Extensive Assist Verbally Surgical wounds(Incision Right Leg)                       Personal Care Assistance Level of Assistance  Bathing, Feeding, Dressing Bathing Assistance: Limited assistance Feeding assistance: Independent Dressing Assistance: Limited assistance     Functional Limitations Info  Sight, Hearing, Speech Sight Info: Adequate Hearing Info: Adequate Speech Info: Adequate    SPECIAL CARE FACTORS FREQUENCY  PT (By licensed PT), OT (By licensed OT)     PT Frequency: (5) OT Frequency: (5)            Contractures      Additional Factors Info  Code Status, Allergies Code Status Info: (Full Code) Allergies Info: (AVELOX MOXIFLOXACIN HCL IN NACL, CODEINE, ACYCLOVIR AND RELATED, SHRIMP SHELLFISH ALLERGY, CIPROFLOXACIN, MOXIFLOXACIN )           Current Medications (09/28/2017):  This is the current hospital active medication list Current Facility-Administered Medications  Medication Dose Route Frequency Provider Last Rate Last Dose  . 0.9 %  sodium chloride infusion   Intravenous Continuous Hooten, Laurice Record, MD 100 mL/hr at 09/28/17 0116    . acetaminophen (TYLENOL) tablet 650 mg  650 mg Oral Q4H PRN Hooten, Laurice Record, MD       Or  . acetaminophen (TYLENOL) suppository 650 mg  650 mg Rectal Q4H PRN Hooten, Laurice Record, MD      . alum & mag hydroxide-simeth (MAALOX/MYLANTA) 200-200-20 MG/5ML suspension 30 mL  30 mL Oral Q4H PRN Hooten, Laurice Record, MD      .  bisacodyl (DULCOLAX) suppository 10 mg  10 mg Rectal Daily PRN Hooten, Laurice Record, MD      . calcium-vitamin D (OSCAL WITH D) 500-200 MG-UNIT per tablet 1 tablet  1 tablet Oral BID Hooten, Laurice Record, MD   1 tablet at 09/28/17 0908  . ceFAZolin (ANCEF) IVPB 2g/100 mL premix  2 g Intravenous Q6H Hooten, Laurice Record, MD   Stopped at 09/28/17 0130  . celecoxib (CELEBREX) capsule 200 mg  200 mg Oral Q12H Hooten, Laurice Record, MD    200 mg at 09/28/17 0908  . diphenhydrAMINE (BENADRYL) 12.5 MG/5ML elixir 12.5-25 mg  12.5-25 mg Oral Q4H PRN Hooten, Laurice Record, MD      . diphenhydrAMINE (BENADRYL) capsule 25 mg  25 mg Oral QHS PRN Hooten, Laurice Record, MD      . enoxaparin (LOVENOX) injection 30 mg  30 mg Subcutaneous Q12H Hooten, Laurice Record, MD   30 mg at 09/28/17 0907  . ferrous sulfate tablet 325 mg  325 mg Oral BID WC Dereck Leep, MD   325 mg at 09/28/17 0908  . fluticasone (FLONASE) 50 MCG/ACT nasal spray 2 spray  2 spray Each Nare Daily Hooten, Laurice Record, MD   2 spray at 09/28/17 0913  . gabapentin (NEURONTIN) capsule 300 mg  300 mg Oral QHS Hooten, Laurice Record, MD   300 mg at 09/27/17 2105  . loratadine (CLARITIN) tablet 10 mg  10 mg Oral Daily PRN Hooten, Laurice Record, MD      . magnesium hydroxide (MILK OF MAGNESIA) suspension 30 mL  30 mL Oral Daily PRN Hooten, Laurice Record, MD      . magnesium oxide (MAG-OX) tablet 400 mg  400 mg Oral QHS Hooten, Laurice Record, MD   400 mg at 09/27/17 2105  . menthol-cetylpyridinium (CEPACOL) lozenge 3 mg  1 lozenge Oral PRN Hooten, Laurice Record, MD       Or  . phenol (CHLORASEPTIC) mouth spray 1 spray  1 spray Mouth/Throat PRN Hooten, Laurice Record, MD      . metoCLOPramide (REGLAN) tablet 10 mg  10 mg Oral TID AC & HS Hooten, Laurice Record, MD   10 mg at 09/28/17 0908  . metoprolol tartrate (LOPRESSOR) tablet 12.5 mg  12.5 mg Oral BID Hooten, Laurice Record, MD      . morphine 2 MG/ML injection 2 mg  2 mg Intravenous Q2H PRN Hooten, Laurice Record, MD      . ondansetron (ZOFRAN) tablet 4 mg  4 mg Oral Q6H PRN Hooten, Laurice Record, MD       Or  . ondansetron (ZOFRAN) injection 4 mg  4 mg Intravenous Q6H PRN Hooten, Laurice Record, MD      . oxyCODONE (Oxy IR/ROXICODONE) immediate release tablet 10 mg  10 mg Oral Q3H PRN Hooten, Laurice Record, MD      . oxyCODONE (Oxy IR/ROXICODONE) immediate release tablet 5 mg  5 mg Oral Q3H PRN Hooten, Laurice Record, MD      . pantoprazole (PROTONIX) EC tablet 40 mg  40 mg Oral BID Dereck Leep, MD   40 mg at 09/28/17 0908   . senna-docusate (Senokot-S) tablet 1 tablet  1 tablet Oral BID Dereck Leep, MD   1 tablet at 09/28/17 0908  . sodium phosphate (FLEET) 7-19 GM/118ML enema 1 enema  1 enema Rectal Once PRN Hooten, Laurice Record, MD      . topiramate (TOPAMAX) tablet 100 mg  100 mg Oral QHS Hooten, Laurice Record, MD  100 mg at 09/27/17 2103  . traMADol (ULTRAM) tablet 50-100 mg  50-100 mg Oral Q4H PRN Dereck Leep, MD   100 mg at 09/28/17 0907  . vitamin C (ASCORBIC ACID) tablet 1,000 mg  1,000 mg Oral BID Dereck Leep, MD   1,000 mg at 09/28/17 0908     Discharge Medications: Please see discharge summary for a list of discharge medications.  Relevant Imaging Results:  Relevant Lab Results:   Additional Information (SSN: [id number])  Smith Mince, Student-Social Work

## 2017-09-29 NOTE — Progress Notes (Signed)
Discharge summary reviewed with verbal understanding. 2 narcotic Rxs given at discharge. Drsg changed changed per order, one drsg provided for home. Answered all questions. Lovenox ad ministration complete. Escorted to personal vehicle via wc.

## 2017-09-29 NOTE — Progress Notes (Signed)
Physical Therapy Treatment Patient Details Name: Jocelyn Hooper MRN: [id number] DOB: 12-05-1940 Today's Date: 09/29/2017    History of Present Illness 77yo female pt admitted for acute hospitalization status post R TKR, WBAT (09/27/17). Previous L TKR.    PT Comments    Pt agreeable to PT; reports 5/10 pain R knee. Pt medicated during session. Pt demonstrating excellent active range of motion/ active assisted range of motion R knee; 0-106 degrees this session. Pt ambulated over 200 ft last session; wishes to conserve energy for going home; therefore, ambulation limited to in the room with education on maneuvering tight spaces. Demonstrates Mod I with bed mobility and transfers from bed and commode. Re educated on stretching in flexion and extension of right knee at home; pt understands. Pt to continue progress of strength, range and endurance with HHPT.    Follow Up Recommendations  Home health PT     Equipment Recommendations  Rolling walker with 5" wheels    Recommendations for Other Services       Precautions / Restrictions Precautions Precautions: Fall;Knee Restrictions Weight Bearing Restrictions: Yes RLE Weight Bearing: Weight bearing as tolerated    Mobility  Bed Mobility Overal bed mobility: Modified Independent Bed Mobility: Supine to Sit;Sit to Supine     Supine to sit: Modified independent (Device/Increase time) Sit to supine: Modified independent (Device/Increase time)   General bed mobility comments: improved ability and speed today; self assists RLE as needed  Transfers Overall transfer level: Needs assistance Equipment used: Rolling walker (2 wheeled) Transfers: Sit to/from Stand Sit to Stand: Supervision         General transfer comment: Improved use of RLE  Ambulation/Gait Ambulation/Gait assistance: Supervision Ambulation Distance (Feet): 25 Feet(performed twice in room) Assistive device: Rolling walker (2 wheeled) Gait Pattern/deviations:  Step-through pattern   Gait velocity interpretation: Below normal speed for age/gender General Gait Details: Advised to avoid turning 360 degrees to sit down. Educated on sidestepping for tight spaces with good demonstration. No LOB   Stairs            Wheelchair Mobility    Modified Rankin (Stroke Patients Only)       Balance Overall balance assessment: Needs assistance         Standing balance support: Bilateral upper extremity supported Standing balance-Leahy Scale: Fair                              Cognition Arousal/Alertness: Awake/alert Behavior During Therapy: WFL for tasks assessed/performed Overall Cognitive Status: Within Functional Limits for tasks assessed                                        Exercises Total Joint Exercises Quad Sets: Strengthening;Both;20 reps;Supine(several in stand pre gait) Knee Flexion: AROM;AAROM;Right;10 reps;Seated(3 positions each rep with 10 sec hold ea; self assist c LLE) Goniometric ROM: 0-106 Other Exercises Other Exercises: set up/distant supervision for toileting    General Comments        Pertinent Vitals/Pain Pain Assessment: 0-10 Pain Score: 5  Pain Location: R knee    Home Living                      Prior Function            PT Goals (current goals can now be found in the care plan  section) Progress towards PT goals: Progressing toward goals    Frequency    BID      PT Plan Current plan remains appropriate    Co-evaluation              AM-PAC PT "6 Clicks" Daily Activity  Outcome Measure  Difficulty turning over in bed (including adjusting bedclothes, sheets and blankets)?: A Little Difficulty moving from lying on back to sitting on the side of the bed? : A Little Difficulty sitting down on and standing up from a chair with arms (e.g., wheelchair, bedside commode, etc,.)?: A Little Help needed moving to and from a bed to chair (including a  wheelchair)?: A Little Help needed walking in hospital room?: A Little Help needed climbing 3-5 steps with a railing? : A Little 6 Click Score: 18    End of Session Equipment Utilized During Treatment: Gait belt Activity Tolerance: Patient tolerated treatment well Patient left: in bed;with call bell/phone within reach;with family/visitor present;Other (comment)(polar care in place) Nurse Communication: Other (comment)(Pt ready for nursing intervention for BM) PT Visit Diagnosis: Muscle weakness (generalized) (M62.81);Difficulty in walking, not elsewhere classified (R26.2);Pain Pain - Right/Left: Right Pain - part of body: Knee     Time: 5110-2111 PT Time Calculation (min) (ACUTE ONLY): 23 min  Charges:  $Gait Training: 8-22 mins $Therapeutic Exercise: 8-22 mins                    G Codes:        Larae Grooms, PTA 09/29/2017, 11:17 AM

## 2017-09-29 NOTE — Progress Notes (Signed)
   Subjective: 2 Days Post-Op Procedure(s) (LRB): COMPUTER ASSISTED TOTAL KNEE ARTHROPLASTY (Right) Patient reports pain as 6 on 0-10 scale.   Patient is well, and has had no acute complaints or problems Pt did very well with therapy yesterday. Meet all goals for going home Plan is to go Home after hospital stay. no nausea and no vomiting but was noted to have some yesterday morning. None since Patient denies any chest pains or shortness of breath. Objective: Vital signs in last 24 hours: Temp:  [98.2 F (36.8 C)-98.7 F (37.1 C)] 98.4 F (36.9 C) (01/16 0605) Pulse Rate:  [74-81] 78 (01/16 0605) Resp:  [16-18] 18 (01/16 0605) BP: (108-135)/(49-63) 135/63 (01/16 0605) SpO2:  [95 %-100 %] 95 % (01/16 0605) well approximated incision Heels are non tender and elevated off the bed using rolled towels Intake/Output from previous day: 01/15 0701 - 01/16 0700 In: 720 [P.O.:720] Out: -  Intake/Output this shift: No intake/output data recorded.  No results for input(s): HGB in the last 72 hours. No results for input(s): WBC, RBC, HCT, PLT in the last 72 hours. No results for input(s): NA, K, CL, CO2, BUN, CREATININE, GLUCOSE, CALCIUM in the last 72 hours. No results for input(s): LABPT, INR in the last 72 hours.  EXAM General - Patient is Alert, Appropriate and Oriented Extremity - Neurologically intact Neurovascular intact Sensation intact distally Intact pulses distally Dorsiflexion/Plantar flexion intact No cellulitis present Compartment soft Dressing - dressing C/D/I Motor Function - intact, moving foot and toes well on exam.    Past Medical History:  Diagnosis Date  . Bursitis of left shoulder   . Chest pain, unspecified   . Chronic kidney disease    KIDNEY STONE CURRENTLY-08-2015-SEES DR COPE  . Complication of anesthesia    PT HAS SEVERE SCOLIOSIS AND FOR TKR ANESTHESIA COULD NOT GET A SPINAL  . Coronary artery disease    ascvd  . CSF leak   . DDD (degenerative  disc disease), cervical   . Diverticulosis   . Esophageal reflux   . Fibrocystic breast disease   . GERD (gastroesophageal reflux disease)   . Headache    H/O  . History of colon polyps   . History of hiatal hernia   . History of kidney stones   . IBS (irritable bowel syndrome)   . Nasal polyps   . Other and unspecified hyperlipidemia   . Scoliosis   . Tinnitus of both ears   . Trigeminal neuralgia syndrome   . Unspecified sinusitis (chronic)   . Vitamin B12 deficiency     Assessment/Plan: 2 Days Post-Op Procedure(s) (LRB): COMPUTER ASSISTED TOTAL KNEE ARTHROPLASTY (Right) Active Problems:   S/P total knee arthroplasty  Estimated body mass index is 25.4 kg/m as calculated from the following:   Height as of this encounter: 5' 5.5" (1.664 m).   Weight as of this encounter: 70.3 kg (155 lb 0 oz). Up with therapy Discharge home with home health  Labs: none DVT Prophylaxis - Lovenox, Foot Pumps and TED hose Weight-Bearing as tolerated to right leg Hemovac was d/c'd. Ends of the drains appeared to be intact D/c to home once she has a bowel movement Please wash operative leg and apply TED stocking to both legs. Please change dressing prior to d/c and give the pt 2 extra honeycomb dressing to take home  [place]. [place] 09/29/2017, 6:48 AM

## 2017-09-29 NOTE — Care Management (Signed)
Sonia Side with [facility] notified that patient will be discharged to home today.

## 2017-10-02 DIAGNOSIS — N189 Chronic kidney disease, unspecified: Secondary | ICD-10-CM | POA: Diagnosis not present

## 2017-10-02 DIAGNOSIS — K589 Irritable bowel syndrome without diarrhea: Secondary | ICD-10-CM | POA: Diagnosis not present

## 2017-10-02 DIAGNOSIS — K219 Gastro-esophageal reflux disease without esophagitis: Secondary | ICD-10-CM | POA: Diagnosis not present

## 2017-10-02 DIAGNOSIS — M4134 Thoracogenic scoliosis, thoracic region: Secondary | ICD-10-CM | POA: Diagnosis not present

## 2017-10-02 DIAGNOSIS — J45909 Unspecified asthma, uncomplicated: Secondary | ICD-10-CM | POA: Diagnosis not present

## 2017-10-02 DIAGNOSIS — G500 Trigeminal neuralgia: Secondary | ICD-10-CM | POA: Diagnosis not present

## 2017-10-02 DIAGNOSIS — K5790 Diverticulosis of intestine, part unspecified, without perforation or abscess without bleeding: Secondary | ICD-10-CM | POA: Diagnosis not present

## 2017-10-02 DIAGNOSIS — B0223 Postherpetic polyneuropathy: Secondary | ICD-10-CM | POA: Diagnosis not present

## 2017-10-02 DIAGNOSIS — Z471 Aftercare following joint replacement surgery: Secondary | ICD-10-CM | POA: Diagnosis not present

## 2017-10-02 DIAGNOSIS — Z96653 Presence of artificial knee joint, bilateral: Secondary | ICD-10-CM | POA: Diagnosis not present

## 2017-10-02 DIAGNOSIS — N320 Bladder-neck obstruction: Secondary | ICD-10-CM | POA: Diagnosis not present

## 2017-10-02 DIAGNOSIS — M5136 Other intervertebral disc degeneration, lumbar region: Secondary | ICD-10-CM | POA: Diagnosis not present

## 2017-10-02 DIAGNOSIS — I2510 Atherosclerotic heart disease of native coronary artery without angina pectoris: Secondary | ICD-10-CM | POA: Diagnosis not present

## 2017-10-11 DIAGNOSIS — B0223 Postherpetic polyneuropathy: Secondary | ICD-10-CM | POA: Diagnosis not present

## 2017-10-11 DIAGNOSIS — N189 Chronic kidney disease, unspecified: Secondary | ICD-10-CM | POA: Diagnosis not present

## 2017-10-11 DIAGNOSIS — J45909 Unspecified asthma, uncomplicated: Secondary | ICD-10-CM | POA: Diagnosis not present

## 2017-10-11 DIAGNOSIS — Z96653 Presence of artificial knee joint, bilateral: Secondary | ICD-10-CM | POA: Diagnosis not present

## 2017-10-11 DIAGNOSIS — K589 Irritable bowel syndrome without diarrhea: Secondary | ICD-10-CM | POA: Diagnosis not present

## 2017-10-11 DIAGNOSIS — K5790 Diverticulosis of intestine, part unspecified, without perforation or abscess without bleeding: Secondary | ICD-10-CM | POA: Diagnosis not present

## 2017-10-11 DIAGNOSIS — Z471 Aftercare following joint replacement surgery: Secondary | ICD-10-CM | POA: Diagnosis not present

## 2017-10-11 DIAGNOSIS — N320 Bladder-neck obstruction: Secondary | ICD-10-CM | POA: Diagnosis not present

## 2017-10-11 DIAGNOSIS — G500 Trigeminal neuralgia: Secondary | ICD-10-CM | POA: Diagnosis not present

## 2017-10-11 DIAGNOSIS — I2510 Atherosclerotic heart disease of native coronary artery without angina pectoris: Secondary | ICD-10-CM | POA: Diagnosis not present

## 2017-10-11 DIAGNOSIS — M5136 Other intervertebral disc degeneration, lumbar region: Secondary | ICD-10-CM | POA: Diagnosis not present

## 2017-10-11 DIAGNOSIS — M4134 Thoracogenic scoliosis, thoracic region: Secondary | ICD-10-CM | POA: Diagnosis not present

## 2017-10-11 DIAGNOSIS — K219 Gastro-esophageal reflux disease without esophagitis: Secondary | ICD-10-CM | POA: Diagnosis not present

## 2017-10-12 DIAGNOSIS — R262 Difficulty in walking, not elsewhere classified: Secondary | ICD-10-CM | POA: Diagnosis not present

## 2017-10-12 DIAGNOSIS — M25561 Pain in right knee: Secondary | ICD-10-CM | POA: Diagnosis not present

## 2017-10-12 DIAGNOSIS — M6281 Muscle weakness (generalized): Secondary | ICD-10-CM | POA: Diagnosis not present

## 2017-10-15 DIAGNOSIS — M25561 Pain in right knee: Secondary | ICD-10-CM | POA: Diagnosis not present

## 2017-10-19 DIAGNOSIS — M25561 Pain in right knee: Secondary | ICD-10-CM | POA: Diagnosis not present

## 2017-10-22 DIAGNOSIS — M25561 Pain in right knee: Secondary | ICD-10-CM | POA: Diagnosis not present

## 2017-10-26 DIAGNOSIS — M25561 Pain in right knee: Secondary | ICD-10-CM | POA: Diagnosis not present

## 2017-10-29 DIAGNOSIS — M25561 Pain in right knee: Secondary | ICD-10-CM | POA: Diagnosis not present

## 2017-11-02 DIAGNOSIS — M25561 Pain in right knee: Secondary | ICD-10-CM | POA: Diagnosis not present

## 2017-11-05 DIAGNOSIS — M25561 Pain in right knee: Secondary | ICD-10-CM | POA: Diagnosis not present

## 2017-11-09 DIAGNOSIS — Z96651 Presence of right artificial knee joint: Secondary | ICD-10-CM | POA: Diagnosis not present

## 2017-11-10 DIAGNOSIS — E538 Deficiency of other specified B group vitamins: Secondary | ICD-10-CM | POA: Diagnosis not present

## 2017-11-10 DIAGNOSIS — G500 Trigeminal neuralgia: Secondary | ICD-10-CM | POA: Diagnosis not present

## 2017-11-10 DIAGNOSIS — R5383 Other fatigue: Secondary | ICD-10-CM | POA: Diagnosis not present

## 2017-11-10 DIAGNOSIS — G43709 Chronic migraine without aura, not intractable, without status migrainosus: Secondary | ICD-10-CM | POA: Diagnosis not present

## 2017-11-10 DIAGNOSIS — M5116 Intervertebral disc disorders with radiculopathy, lumbar region: Secondary | ICD-10-CM | POA: Diagnosis not present

## 2017-12-14 DIAGNOSIS — E782 Mixed hyperlipidemia: Secondary | ICD-10-CM | POA: Diagnosis not present

## 2017-12-14 DIAGNOSIS — E538 Deficiency of other specified B group vitamins: Secondary | ICD-10-CM | POA: Diagnosis not present

## 2017-12-17 DIAGNOSIS — R350 Frequency of micturition: Secondary | ICD-10-CM | POA: Diagnosis not present

## 2017-12-17 DIAGNOSIS — R319 Hematuria, unspecified: Secondary | ICD-10-CM | POA: Diagnosis not present

## 2017-12-22 DIAGNOSIS — N3090 Cystitis, unspecified without hematuria: Secondary | ICD-10-CM | POA: Diagnosis not present

## 2017-12-22 DIAGNOSIS — E782 Mixed hyperlipidemia: Secondary | ICD-10-CM | POA: Diagnosis not present

## 2017-12-22 DIAGNOSIS — Z79899 Other long term (current) drug therapy: Secondary | ICD-10-CM | POA: Diagnosis not present

## 2017-12-22 DIAGNOSIS — Z0000 Encounter for general adult medical examination without abnormal findings: Secondary | ICD-10-CM | POA: Diagnosis not present

## 2017-12-22 DIAGNOSIS — E538 Deficiency of other specified B group vitamins: Secondary | ICD-10-CM | POA: Diagnosis not present

## 2017-12-22 DIAGNOSIS — A4150 Gram-negative sepsis, unspecified: Secondary | ICD-10-CM | POA: Diagnosis not present

## 2017-12-28 DIAGNOSIS — G43709 Chronic migraine without aura, not intractable, without status migrainosus: Secondary | ICD-10-CM | POA: Diagnosis not present

## 2018-01-05 DIAGNOSIS — L821 Other seborrheic keratosis: Secondary | ICD-10-CM | POA: Diagnosis not present

## 2018-01-05 DIAGNOSIS — D485 Neoplasm of uncertain behavior of skin: Secondary | ICD-10-CM | POA: Diagnosis not present

## 2018-01-05 DIAGNOSIS — N3090 Cystitis, unspecified without hematuria: Secondary | ICD-10-CM | POA: Diagnosis not present

## 2018-01-11 DIAGNOSIS — G43709 Chronic migraine without aura, not intractable, without status migrainosus: Secondary | ICD-10-CM | POA: Diagnosis not present

## 2018-01-18 DIAGNOSIS — M25512 Pain in left shoulder: Secondary | ICD-10-CM | POA: Diagnosis not present

## 2018-01-18 DIAGNOSIS — G43709 Chronic migraine without aura, not intractable, without status migrainosus: Secondary | ICD-10-CM | POA: Diagnosis not present

## 2018-01-18 DIAGNOSIS — M7542 Impingement syndrome of left shoulder: Secondary | ICD-10-CM | POA: Diagnosis not present

## 2018-01-26 DIAGNOSIS — Z471 Aftercare following joint replacement surgery: Secondary | ICD-10-CM | POA: Diagnosis not present

## 2018-02-10 DIAGNOSIS — N761 Subacute and chronic vaginitis: Secondary | ICD-10-CM | POA: Diagnosis not present

## 2018-02-10 DIAGNOSIS — B0229 Other postherpetic nervous system involvement: Secondary | ICD-10-CM | POA: Diagnosis not present

## 2018-03-16 ENCOUNTER — Encounter: Payer: Self-pay | Admitting: Urology

## 2018-03-16 ENCOUNTER — Ambulatory Visit (INDEPENDENT_AMBULATORY_CARE_PROVIDER_SITE_OTHER): Payer: PPO | Admitting: Urology

## 2018-03-16 VITALS — BP 101/65 | HR 66 | Ht 66.0 in | Wt 140.0 lb

## 2018-03-16 DIAGNOSIS — Z87442 Personal history of urinary calculi: Secondary | ICD-10-CM | POA: Diagnosis not present

## 2018-03-16 DIAGNOSIS — N390 Urinary tract infection, site not specified: Secondary | ICD-10-CM | POA: Diagnosis not present

## 2018-03-16 NOTE — Progress Notes (Signed)
03/16/2018 6:10 PM   Jocelyn Hooper 27-Sep-1940 [id number]  Referring provider: Rusty Aus, MD [place] [facility] [place], [place] [address]  Chief Complaint  Patient presents with  . Recurrent UTI    New Patient    HPI: 77 year old female here to establish care for history of recurrent urinary tract infections and kidney stones.  She is previously followed by Dr. Jacqlyn Larsen.  She presents today to establish care.  She has a personal history of recurrent urinary tract infections.  Currently, she is not doing anything in particular for UTI prevention other than vitamin C.  Most recent UTI 12/17/2017, proteus.  This is her only urinary tract infection this year.  Is worried about what she will do that she has an early urinary tract infection and thus would like to provide treatment.  Most recent KUB on 08/2017 without stones.  She said no recent episodes of flank pain or gross hematuria.  No recent stone episodes.  She has a remote history of which sounds like ESWL in the remote past.    PMH: Past Medical History:  Diagnosis Date  . Bursitis of left shoulder   . Chest pain, unspecified   . Chronic kidney disease    KIDNEY STONE CURRENTLY-08-2015-SEES DR COPE  . Complication of anesthesia    PT HAS SEVERE SCOLIOSIS AND FOR TKR ANESTHESIA COULD NOT GET A SPINAL  . Coronary artery disease    ascvd  . CSF leak   . DDD (degenerative disc disease), cervical   . Diverticulosis   . Esophageal reflux   . Fibrocystic breast disease   . GERD (gastroesophageal reflux disease)   . Headache    H/O  . History of colon polyps   . History of hiatal hernia   . History of kidney stones   . IBS (irritable bowel syndrome)   . Nasal polyps   . Other and unspecified hyperlipidemia   . Scoliosis   . Tinnitus of both ears   . Trigeminal neuralgia syndrome   . Unspecified sinusitis (chronic)   . Vitamin B12 deficiency     Surgical History: Past Surgical  History:  Procedure Laterality Date  . ABDOMINAL HYSTERECTOMY    . APPENDECTOMY    . BACK SURGERY  1982   disc  . [place]     left   . CATARACT EXTRACTION     right  . CHOLECYSTECTOMY  2002  . COLONOSCOPY WITH PROPOFOL N/A 03/10/2017   Procedure: COLONOSCOPY WITH PROPOFOL;  Surgeon: Manya Silvas, MD;  Location: [facility] ENDOSCOPY;  Service: Endoscopy;  Laterality: N/A;  . EYE SURGERY Bilateral    Cataract Extraction with IOL  . hysterectomy (other)  1974  . JOINT REPLACEMENT     LEFT knee  . KIDNEY STONE SURGERY    . KNEE ARTHROPLASTY Right 09/27/2017   Procedure: COMPUTER ASSISTED TOTAL KNEE ARTHROPLASTY;  Surgeon: Dereck Leep, MD;  Location: [facility] ORS;  Service: Orthopedics;  Laterality: Right;  . KNEE ARTHROSCOPY Right 09/13/2015   Procedure: ARTHROSCOPY KNEE, PARTIAL LATERAL MENISECTOMY, CHONDROPLASTY ;  Surgeon: Dereck Leep, MD;  Location: [facility] ORS;  Service: Orthopedics;  Laterality: Right;  . KNEE SURGERY  2004   having a lot of problems with left knee  . [place], 2003, 2012  . sinus procedures  1970,1990,1994,2005  . TONSILLECTOMY      Home Medications:  Allergies as of 03/16/2018      Reactions   Avelox [moxifloxacin Hcl In Nacl] Other (See Comments)   Redness all over in about 5 minutes after taking   Codeine Nausea Only   REACTION: nausea   Acyclovir And Related    Shrimp [shellfish Allergy]    Hives    Ciprofloxacin Rash   Moxifloxacin Hives, Rash   Other reaction(s): OTHER      Medication List        Accurate as of 03/16/18 11:59 PM. Always use your most recent med list.          CALTRATE 600+D 600-400 MG-UNIT tablet Generic drug:  Calcium Carbonate-Vitamin D Take 1 tablet by mouth 2 (two) times daily.   fluticasone 50 MCG/ACT nasal spray Commonly known as:  FLONASE Place 2 sprays into both nostrils daily.   gabapentin 300 MG capsule Commonly known as:   NEURONTIN Take 300 mg by mouth at bedtime.   lansoprazole 30 MG capsule Commonly known as:  PREVACID Take 30 mg by mouth at bedtime.   metoprolol tartrate 25 MG tablet Commonly known as:  LOPRESSOR Take 0.5 tablets (12.5 mg total) by mouth 2 (two) times daily.   STOOL SOFTENER PO Take 1 tablet by mouth daily.   TOPAMAX 100 MG tablet Generic drug:  topiramate Take 100 mg by mouth at bedtime.   vitamin C 500 MG tablet Commonly known as:  ASCORBIC ACID Take 1,000 mg by mouth 2 (two) times daily.       Allergies:  Allergies  Allergen Reactions  . Avelox [Moxifloxacin Hcl In Nacl] Other (See Comments)    Redness all over in about 5 minutes after taking  . Codeine Nausea Only    REACTION: nausea  . Acyclovir And Related   . Shrimp [Shellfish Allergy]     Hives    . Ciprofloxacin Rash  . Moxifloxacin Hives and Rash    Other reaction(s): OTHER    Family History: Family History  Problem Relation Age of Onset  . Heart attack Mother 74  . Heart failure Mother   . Heart attack Father   . Heart disease Father        CABG  . Heart attack Brother 1       premature    Social History:  reports that she has never smoked. She has never used smokeless tobacco. She reports that she does not drink alcohol or use drugs.  ROS: UROLOGY Frequent Urination?: No Hard to postpone urination?: No Burning/pain with urination?: No Get up at night to urinate?: Yes Leakage of urine?: No Urine stream starts and stops?: No Trouble starting stream?: No Do you have to strain to urinate?: No Blood in urine?: No Urinary tract infection?: No Sexually transmitted disease?: No Injury to kidneys or bladder?: No Painful intercourse?: No Weak stream?: No Currently pregnant?: No Vaginal bleeding?: No Last menstrual period?: n  Gastrointestinal Nausea?: No Vomiting?: No Indigestion/heartburn?: No Diarrhea?: No Constipation?: No  Constitutional Fever: No Night sweats?: No Weight  loss?: No Fatigue?: No  Skin Skin rash/lesions?: No Itching?: No  Eyes Blurred vision?: No Double vision?: No  Ears/Nose/Throat Sore throat?: No Sinus problems?: No  Hematologic/Lymphatic Swollen glands?: No Easy bruising?: No  Cardiovascular Leg swelling?: No Chest pain?: No  Respiratory Cough?: No Shortness of breath?: No  Endocrine Excessive thirst?: No  Musculoskeletal Back pain?: Yes Joint pain?: No  Neurological Headaches?: Yes Dizziness?: No  Psychologic Depression?: No Anxiety?: No  Physical Exam: BP 101/65  Pulse 66   Ht 5\' 6"  (1.676 m)   Wt 140 lb (63.5 kg)   BMI 22.60 kg/m   Constitutional:  Alert and oriented, No acute distress. HEENT: [place] AT, moist mucus membranes.  Trachea midline, no masses. Cardiovascular: No clubbing, cyanosis, or edema. Respiratory: Normal respiratory effort, no increased work of breathing. GI: Abdomen is soft, nontender, nondistended, no abdominal masses GU: No CVA tenderness Skin: No rashes, bruises or suspicious lesions. Neurologic: Grossly intact, no focal deficits, moving all 4 extremities. Psychiatric: Normal mood and affect.  Laboratory Data: Lab Results  Component Value Date   WBC 6.5 09/16/2017   HGB 14.1 09/16/2017   HCT 42.2 09/16/2017   MCV 95.2 09/16/2017   PLT 247 09/16/2017    Lab Results  Component Value Date   CREATININE 0.99 09/16/2017    Lab Results  Component Value Date   HGBA1C 5.1 10/07/2016    Urinalysis N/a  Pertinent Imaging: n/a  Assessment & Plan:    1. Recurrent UTI Personal history of recurrent urinary tract infections Only one infection over the past year We discussed antibiotic stewardship today to treat We discussed additional prevention techniques included probiotic daily, as well as cranberry tablets If she continues to have increased frequency of infections, would consider topical estrogen cream We discussed today that we offer daily nurse visits as needed  during the weekdays for signs and symptoms of UTIs of which she will take advantage   2. History of kidney stones No recent kidney stones Most recent KUB negative for stones She would like to continue to follow annually with KUB - DG Abd 1 View; Future   Return in about 1 year (around 03/17/2019) for KUB.  Hollice Espy, MD  [facility] [address], [place], [place] [address] [phone]

## 2018-04-20 DIAGNOSIS — G43709 Chronic migraine without aura, not intractable, without status migrainosus: Secondary | ICD-10-CM | POA: Diagnosis not present

## 2018-05-11 DIAGNOSIS — Z1231 Encounter for screening mammogram for malignant neoplasm of breast: Secondary | ICD-10-CM | POA: Diagnosis not present

## 2018-05-18 ENCOUNTER — Ambulatory Visit: Payer: PPO | Admitting: Cardiovascular Disease

## 2018-05-18 ENCOUNTER — Encounter: Payer: Self-pay | Admitting: Cardiovascular Disease

## 2018-05-18 VITALS — BP 104/60 | HR 61 | Ht 66.0 in | Wt 142.0 lb

## 2018-05-18 DIAGNOSIS — E782 Mixed hyperlipidemia: Secondary | ICD-10-CM | POA: Diagnosis not present

## 2018-05-18 DIAGNOSIS — R600 Localized edema: Secondary | ICD-10-CM

## 2018-05-18 DIAGNOSIS — R0602 Shortness of breath: Secondary | ICD-10-CM | POA: Diagnosis not present

## 2018-05-18 DIAGNOSIS — R079 Chest pain, unspecified: Secondary | ICD-10-CM | POA: Diagnosis not present

## 2018-05-18 DIAGNOSIS — I2510 Atherosclerotic heart disease of native coronary artery without angina pectoris: Secondary | ICD-10-CM

## 2018-05-18 NOTE — Progress Notes (Signed)
Cardiology Office Note  Date:  05/18/2018   ID:  Jocelyn Hooper, DOB 06/17/1941, MRN [id number]  PCP:  Rusty Aus, MD   Chief Complaint  Patient presents with  . OTHER    12 month f/u no complaints today. Meds reviewed verbally with pt.    HPI:  Jocelyn Hooper is a 77 year old woman with  hyperlipidemia,  degenerative disc disease,  GERD, IBS,  history of chest pain with normal cardiac catheterization may 2003 at [facility],   previously seen in our clinic for leg swelling,  leg tingling previously  seen for shortness of breath,  who presents for follow-up after recent hospitalization for chest pain  Denies any recurrent chest pain No regular exercise program but stays active  Discussed previous CT coronary calcium score Score of 80 Not on a cholesterol medication  Wonders if she needs to be on metoprolol This was started after hospitalization early 2018 for chest pain,  At the time of discharge were started on metoprolol 12.5 mg twice daily Was seen in the hospital at that time by Dr. Saunders Revel, cardiology  EKG personally reviewed by myself on todays visit Shows normal sinus rhythm rate 61 bpm no significant ST or T wave changes  Other past medical history reviewed In early 2018 seen in the hospital for chest pain  woke up in the middle of the night with mediastinal discomfort lasting for 15 minutes. She went to the emergency room Cardiac enzymes were negative 3 Echocardiogram performed showing normal LV function greater than 55%, mildly enlarged ascending aorta  Stress test showed low risk scan, Septal, mid inferolateral and apical lateral reversible defect, possible artifact  Lab work reviewed with her total cholesterol 198, LDL around 100    PMH:   has a past medical history of Bursitis of left shoulder, Chest pain, unspecified, Chronic kidney disease, Complication of anesthesia, Coronary artery disease, CSF leak, DDD (degenerative disc disease), cervical,  Diverticulosis, Esophageal reflux, Fibrocystic breast disease, GERD (gastroesophageal reflux disease), Headache, History of colon polyps, History of hiatal hernia, History of kidney stones, IBS (irritable bowel syndrome), Nasal polyps, Other and unspecified hyperlipidemia, Scoliosis, Tinnitus of both ears, Trigeminal neuralgia syndrome, Unspecified sinusitis (chronic), and Vitamin B12 deficiency.  PSH:    Past Surgical History:  Procedure Laterality Date  . ABDOMINAL HYSTERECTOMY    . APPENDECTOMY    . BACK SURGERY  1982   disc  . [place]     left   . CATARACT EXTRACTION     right  . CHOLECYSTECTOMY  2002  . COLONOSCOPY WITH PROPOFOL N/A 03/10/2017   Procedure: COLONOSCOPY WITH PROPOFOL;  Surgeon: Manya Silvas, MD;  Location: [facility] ENDOSCOPY;  Service: Endoscopy;  Laterality: N/A;  . EYE SURGERY Bilateral    Cataract Extraction with IOL  . hysterectomy (other)  1974  . JOINT REPLACEMENT     LEFT knee  . KIDNEY STONE SURGERY    . KNEE ARTHROPLASTY Right 09/27/2017   Procedure: COMPUTER ASSISTED TOTAL KNEE ARTHROPLASTY;  Surgeon: Dereck Leep, MD;  Location: [facility] ORS;  Service: Orthopedics;  Laterality: Right;  . KNEE ARTHROSCOPY Right 09/13/2015   Procedure: ARTHROSCOPY KNEE, PARTIAL LATERAL MENISECTOMY, CHONDROPLASTY ;  Surgeon: Dereck Leep, MD;  Location: [facility] ORS;  Service: Orthopedics;  Laterality: Right;  . KNEE SURGERY  2004   having a lot of problems with left knee  . NASAL SINUS SURGERY  1967, 1988, 1993, 2003,  2012  . sinus procedures  1970,1990,1994,2005  . TONSILLECTOMY      Current Outpatient Medications  Medication Sig Dispense Refill  . aspirin EC 81 MG tablet Take 81 mg by mouth daily.    . Calcium Carbonate-Vitamin D (CALTRATE 600+D) 600-400 MG-UNIT per tablet Take 1 tablet by mouth 2 (two) times daily.      Mariane Baumgarten Calcium (STOOL SOFTENER PO) Take 1 tablet by mouth daily.     Marland Kitchen estradiol  (ESTRACE) 0.5 MG tablet Take 0.5 mg by mouth daily.    . fluticasone (FLONASE) 50 MCG/ACT nasal spray Place 2 sprays into both nostrils daily.     Marland Kitchen gabapentin (NEURONTIN) 300 MG capsule Take 300 mg by mouth at bedtime.     . lansoprazole (PREVACID) 30 MG capsule Take 30 mg by mouth at bedtime.     . topiramate (TOPAMAX) 100 MG tablet Take 150 mg by mouth at bedtime.     . vitamin C (ASCORBIC ACID) 500 MG tablet Take 1,000 mg by mouth 2 (two) times daily.      No current facility-administered medications for this visit.      Allergies:   Avelox [moxifloxacin hcl in nacl]; Codeine; Acyclovir and related; Shrimp [shellfish allergy]; Ciprofloxacin; and Moxifloxacin   Social History:  The patient  reports that she has never smoked. She has never used smokeless tobacco. She reports that she does not drink alcohol or use drugs.   Family History:   family history includes Heart attack in her father; Heart attack (age of onset: 27) in her brother; Heart attack (age of onset: 3) in her mother; Heart disease in her father; Heart failure in her mother.    Review of Systems: Review of Systems  Constitutional: Negative.   Respiratory: Negative.   Cardiovascular: Negative.   Gastrointestinal: Negative.   Musculoskeletal: Negative.   Neurological: Negative.   Psychiatric/Behavioral: Negative.   All other systems reviewed and are negative.    PHYSICAL EXAM: VS:  BP 104/60 (BP Location: Left Arm, Patient Position: Sitting, Cuff Size: Normal)   Pulse 61   Ht 5\' 6"  (1.676 m)   Wt 142 lb (64.4 kg)   BMI 22.92 kg/m  , BMI Body mass index is 22.92 kg/m. Constitutional:  oriented to person, place, and time. No distress.  HENT:  Head: Normocephalic and atraumatic.  Eyes:  no discharge. No scleral icterus.  Neck: Normal range of motion. Neck supple. No JVD present.  Cardiovascular: Normal rate, regular rhythm, normal heart sounds and intact distal pulses. Exam reveals no gallop and no friction  rub. No edema No murmur heard. Pulmonary/Chest: Effort normal and breath sounds normal. No stridor. No respiratory distress.  no wheezes.  no rales.  no tenderness.  Abdominal: Soft.  no distension.  no tenderness.  Musculoskeletal: Normal range of motion.  no  tenderness or deformity.  Neurological:  normal muscle tone. Coordination normal. No atrophy Skin: Skin is warm and dry. No rash noted. not diaphoretic.  Psychiatric:  normal mood and affect. behavior is normal. Thought content normal.    Recent Labs: 09/16/2017: ALT 31; BUN 19; Creatinine, Ser 0.99; Hemoglobin 14.1; Platelets 247; Potassium 3.9; Sodium 141    Lipid Panel Lab Results  Component Value Date   CHOL 198 10/07/2016   HDL 75 10/07/2016   LDLCALC 105 (H) 10/07/2016   TRIG 88 10/07/2016      Wt Readings from Last 3 Encounters:  05/18/18 142 lb (64.4 kg)  03/16/18 140 lb (63.5 kg)  09/27/17 155 lb (70.3 kg)     ASSESSMENT AND PLAN:  Mixed hyperlipidemia -  Low calcium score on CT scan last year She is inclined not to be on a statin  Localized edema -  No significant swelling No further work-up  Exertional shortness of breath -   normal echocardiogram and stress test in 2018 Recommended regular walking program  Chest pain with moderate risk for cardiac etiology Denies any further chest pain symptoms Previous work-up echocardiogram stress test CT coronary calcium score places her at low risk  Hypertension Recommended she hold the metoprolol Blood pressure running low  Disposition:   F/U as needed   Total encounter time more than 25 minutes  Greater than 50% was spent in counseling and coordination of care with the patient    No orders of the defined types were placed in this encounter.    Signed, Esmond Plants, M.D., Ph.D. 05/18/2018  [place], [place]

## 2018-05-18 NOTE — Patient Instructions (Signed)
Medication Instructions:   Ok to stop metoprolol  Labwork:  No new labs needed  Testing/Procedures:  No further testing at this time   Follow-Up: It was a pleasure seeing you in the office today. Please call us if you have new issues that need to be addressed before your next appt.  [phone]  Your physician wants you to follow-up in:  As needed  If you need a refill on your cardiac medications before your next appointment, please call your pharmacy.  For educational health videos Log in to : [email or web address] Or : [email or web address], password : [email or web address]

## 2018-06-21 DIAGNOSIS — M7631 Iliotibial band syndrome, right leg: Secondary | ICD-10-CM | POA: Diagnosis not present

## 2018-06-21 DIAGNOSIS — M25561 Pain in right knee: Secondary | ICD-10-CM | POA: Diagnosis not present

## 2018-06-22 DIAGNOSIS — E782 Mixed hyperlipidemia: Secondary | ICD-10-CM | POA: Diagnosis not present

## 2018-06-22 DIAGNOSIS — J309 Allergic rhinitis, unspecified: Secondary | ICD-10-CM | POA: Diagnosis not present

## 2018-06-22 DIAGNOSIS — E538 Deficiency of other specified B group vitamins: Secondary | ICD-10-CM | POA: Diagnosis not present

## 2018-06-22 DIAGNOSIS — R51 Headache: Secondary | ICD-10-CM | POA: Diagnosis not present

## 2018-06-22 DIAGNOSIS — Z79899 Other long term (current) drug therapy: Secondary | ICD-10-CM | POA: Diagnosis not present

## 2018-06-22 DIAGNOSIS — N3090 Cystitis, unspecified without hematuria: Secondary | ICD-10-CM | POA: Diagnosis not present

## 2018-06-29 DIAGNOSIS — E782 Mixed hyperlipidemia: Secondary | ICD-10-CM | POA: Diagnosis not present

## 2018-06-29 DIAGNOSIS — Z79899 Other long term (current) drug therapy: Secondary | ICD-10-CM | POA: Diagnosis not present

## 2018-06-29 DIAGNOSIS — Z0000 Encounter for general adult medical examination without abnormal findings: Secondary | ICD-10-CM | POA: Diagnosis not present

## 2018-06-30 DIAGNOSIS — L821 Other seborrheic keratosis: Secondary | ICD-10-CM | POA: Diagnosis not present

## 2018-07-07 DIAGNOSIS — M7631 Iliotibial band syndrome, right leg: Secondary | ICD-10-CM | POA: Diagnosis not present

## 2018-07-19 DIAGNOSIS — G500 Trigeminal neuralgia: Secondary | ICD-10-CM | POA: Diagnosis not present

## 2018-09-27 DIAGNOSIS — Z96653 Presence of artificial knee joint, bilateral: Secondary | ICD-10-CM | POA: Diagnosis not present

## 2018-09-27 DIAGNOSIS — M170 Bilateral primary osteoarthritis of knee: Secondary | ICD-10-CM | POA: Diagnosis not present

## 2018-09-27 DIAGNOSIS — M7631 Iliotibial band syndrome, right leg: Secondary | ICD-10-CM | POA: Diagnosis not present

## 2018-10-03 DIAGNOSIS — H5711 Ocular pain, right eye: Secondary | ICD-10-CM | POA: Diagnosis not present

## 2018-11-07 DIAGNOSIS — M25512 Pain in left shoulder: Secondary | ICD-10-CM | POA: Diagnosis not present

## 2018-11-07 DIAGNOSIS — M7542 Impingement syndrome of left shoulder: Secondary | ICD-10-CM | POA: Diagnosis not present

## 2018-11-14 DIAGNOSIS — M25561 Pain in right knee: Secondary | ICD-10-CM | POA: Diagnosis not present

## 2018-11-16 DIAGNOSIS — M25561 Pain in right knee: Secondary | ICD-10-CM | POA: Diagnosis not present

## 2018-11-18 DIAGNOSIS — M25561 Pain in right knee: Secondary | ICD-10-CM | POA: Diagnosis not present

## 2018-11-21 DIAGNOSIS — M25561 Pain in right knee: Secondary | ICD-10-CM | POA: Diagnosis not present

## 2018-11-23 DIAGNOSIS — M25561 Pain in right knee: Secondary | ICD-10-CM | POA: Diagnosis not present

## 2018-11-28 DIAGNOSIS — M6281 Muscle weakness (generalized): Secondary | ICD-10-CM | POA: Diagnosis not present

## 2018-11-30 DIAGNOSIS — M6281 Muscle weakness (generalized): Secondary | ICD-10-CM | POA: Diagnosis not present

## 2018-12-05 DIAGNOSIS — M6281 Muscle weakness (generalized): Secondary | ICD-10-CM | POA: Diagnosis not present

## 2018-12-07 DIAGNOSIS — M6281 Muscle weakness (generalized): Secondary | ICD-10-CM | POA: Diagnosis not present

## 2018-12-27 DIAGNOSIS — Z79899 Other long term (current) drug therapy: Secondary | ICD-10-CM | POA: Diagnosis not present

## 2019-01-03 DIAGNOSIS — M5010 Cervical disc disorder with radiculopathy, unspecified cervical region: Secondary | ICD-10-CM | POA: Diagnosis not present

## 2019-01-03 DIAGNOSIS — E782 Mixed hyperlipidemia: Secondary | ICD-10-CM | POA: Diagnosis not present

## 2019-01-03 DIAGNOSIS — Z0000 Encounter for general adult medical examination without abnormal findings: Secondary | ICD-10-CM | POA: Diagnosis not present

## 2019-01-03 DIAGNOSIS — E538 Deficiency of other specified B group vitamins: Secondary | ICD-10-CM | POA: Diagnosis not present

## 2019-01-03 DIAGNOSIS — R0789 Other chest pain: Secondary | ICD-10-CM | POA: Diagnosis not present

## 2019-01-03 DIAGNOSIS — N3090 Cystitis, unspecified without hematuria: Secondary | ICD-10-CM | POA: Diagnosis not present

## 2019-01-05 DIAGNOSIS — D485 Neoplasm of uncertain behavior of skin: Secondary | ICD-10-CM | POA: Diagnosis not present

## 2019-01-05 DIAGNOSIS — D0461 Carcinoma in situ of skin of right upper limb, including shoulder: Secondary | ICD-10-CM | POA: Diagnosis not present

## 2019-01-05 DIAGNOSIS — C44729 Squamous cell carcinoma of skin of left lower limb, including hip: Secondary | ICD-10-CM | POA: Diagnosis not present

## 2019-01-17 DIAGNOSIS — Z0000 Encounter for general adult medical examination without abnormal findings: Secondary | ICD-10-CM | POA: Diagnosis not present

## 2019-01-17 DIAGNOSIS — K210 Gastro-esophageal reflux disease with esophagitis: Secondary | ICD-10-CM | POA: Diagnosis not present

## 2019-01-31 ENCOUNTER — Ambulatory Visit
Admission: RE | Admit: 2019-01-31 | Discharge: 2019-01-31 | Disposition: A | Discharge status: Home / Self Care | Payer: PPO | Source: Ambulatory Visit | Attending: Internal Medicine | Admitting: Internal Medicine

## 2019-01-31 ENCOUNTER — Other Ambulatory Visit: Payer: Self-pay | Admitting: Internal Medicine

## 2019-01-31 ENCOUNTER — Other Ambulatory Visit (HOSPITAL_COMMUNITY): Payer: Self-pay | Admitting: Internal Medicine

## 2019-01-31 ENCOUNTER — Other Ambulatory Visit: Payer: Self-pay

## 2019-01-31 DIAGNOSIS — R110 Nausea: Secondary | ICD-10-CM | POA: Diagnosis not present

## 2019-01-31 DIAGNOSIS — K8050 Calculus of bile duct without cholangitis or cholecystitis without obstruction: Secondary | ICD-10-CM | POA: Diagnosis not present

## 2019-01-31 DIAGNOSIS — R1114 Bilious vomiting: Secondary | ICD-10-CM | POA: Diagnosis not present

## 2019-01-31 DIAGNOSIS — Z9049 Acquired absence of other specified parts of digestive tract: Secondary | ICD-10-CM | POA: Diagnosis not present

## 2019-01-31 DIAGNOSIS — R112 Nausea with vomiting, unspecified: Secondary | ICD-10-CM | POA: Diagnosis not present

## 2019-02-07 ENCOUNTER — Telehealth: Payer: Self-pay | Admitting: Urology

## 2019-02-07 ENCOUNTER — Other Ambulatory Visit: Payer: Self-pay | Admitting: Student

## 2019-02-07 DIAGNOSIS — K219 Gastro-esophageal reflux disease without esophagitis: Secondary | ICD-10-CM

## 2019-02-07 DIAGNOSIS — R110 Nausea: Secondary | ICD-10-CM | POA: Diagnosis not present

## 2019-02-07 DIAGNOSIS — N390 Urinary tract infection, site not specified: Secondary | ICD-10-CM

## 2019-02-07 DIAGNOSIS — K5909 Other constipation: Secondary | ICD-10-CM | POA: Diagnosis not present

## 2019-02-07 NOTE — Telephone Encounter (Signed)
Pe

## 2019-02-07 NOTE — Telephone Encounter (Signed)
Pt called and states that she is having frequent urination and she states that she doesn't go a lot when she goes too the restroom. Please advise.

## 2019-02-09 NOTE — Telephone Encounter (Signed)
Per Dr. Erlene Quan patient should drop off a UA sample to check for infection. Patient was notified she states that she cannot come today and will come on Monday to drop of UA will call with results, order in

## 2019-02-09 NOTE — Addendum Note (Signed)
Addended by: Tommy Rainwater on: 02/09/2019 03:10 PM   Modules accepted: Orders

## 2019-02-13 ENCOUNTER — Other Ambulatory Visit: Payer: PPO

## 2019-02-13 ENCOUNTER — Other Ambulatory Visit: Payer: Self-pay

## 2019-02-13 DIAGNOSIS — N390 Urinary tract infection, site not specified: Secondary | ICD-10-CM | POA: Diagnosis not present

## 2019-02-13 LAB — MICROSCOPIC EXAMINATION: RBC: NONE SEEN /hpf (ref 0–2)

## 2019-02-13 LAB — URINALYSIS, COMPLETE
Bilirubin, UA: NEGATIVE
Glucose, UA: NEGATIVE
Ketones, UA: NEGATIVE
Nitrite, UA: NEGATIVE
Protein,UA: NEGATIVE
RBC, UA: NEGATIVE
Specific Gravity, UA: 1.02 (ref 1.005–1.030)
Urobilinogen, Ur: 0.2 mg/dL (ref 0.2–1.0)
pH, UA: 7 (ref 5.0–7.5)

## 2019-02-13 NOTE — Progress Notes (Unsigned)
Patient presents today with urinary frequency and incontinence. Patient states she has not had any Urological surgeries or been on ABX in the last 30 days. A urine was collected today for UA, UCX.

## 2019-02-15 LAB — CULTURE, URINE COMPREHENSIVE

## 2019-02-16 ENCOUNTER — Telehealth: Payer: Self-pay

## 2019-02-16 NOTE — Telephone Encounter (Signed)
-----   Message from Hollice Espy, MD sent at 02/15/2019  4:47 PM EDT ----- UCx was negative for UTI.  She did grow a small amount of vaginal flora likely contaminant.  Hollice Espy, MD

## 2019-02-16 NOTE — Telephone Encounter (Signed)
Patient notified

## 2019-02-21 ENCOUNTER — Other Ambulatory Visit: Payer: Self-pay

## 2019-02-21 ENCOUNTER — Ambulatory Visit
Admission: RE | Admit: 2019-02-21 | Discharge: 2019-02-21 | Disposition: A | Discharge status: Home / Self Care | Payer: PPO | Source: Ambulatory Visit | Attending: Student | Admitting: Student

## 2019-02-21 DIAGNOSIS — R110 Nausea: Secondary | ICD-10-CM | POA: Diagnosis not present

## 2019-02-21 DIAGNOSIS — K219 Gastro-esophageal reflux disease without esophagitis: Secondary | ICD-10-CM | POA: Diagnosis not present

## 2019-03-06 DIAGNOSIS — M25512 Pain in left shoulder: Secondary | ICD-10-CM | POA: Diagnosis not present

## 2019-03-09 ENCOUNTER — Ambulatory Visit
Admission: RE | Admit: 2019-03-09 | Discharge: 2019-03-09 | Disposition: A | Discharge status: Home / Self Care | Payer: PPO | Source: Ambulatory Visit | Attending: Urology | Admitting: Urology

## 2019-03-09 ENCOUNTER — Other Ambulatory Visit: Payer: Self-pay

## 2019-03-09 DIAGNOSIS — Z87442 Personal history of urinary calculi: Secondary | ICD-10-CM

## 2019-03-09 DIAGNOSIS — K5730 Diverticulosis of large intestine without perforation or abscess without bleeding: Secondary | ICD-10-CM | POA: Diagnosis not present

## 2019-03-10 ENCOUNTER — Telehealth: Payer: Self-pay | Admitting: *Deleted

## 2019-03-10 NOTE — Telephone Encounter (Addendum)
Patient informed-verbalized understanding. She would still like to keep her appointment to discuss urinary symptoms she is having in the morning. Denies pain or UTIs.   ----- Message from Hollice Espy, MD sent at 03/09/2019  3:59 PM EDT ----- Please let this patient know that her KUB showed no obvious stones.  If she is doing fine and not having any flank pain or UTIs, she does not necessarily need to see me this year.  It is totally up to her.  Hollice Espy, MD

## 2019-03-15 ENCOUNTER — Ambulatory Visit: Payer: PPO | Admitting: Urology

## 2019-03-22 ENCOUNTER — Encounter: Payer: Self-pay | Admitting: Urology

## 2019-03-22 ENCOUNTER — Ambulatory Visit: Payer: PPO | Admitting: Urology

## 2019-03-22 ENCOUNTER — Other Ambulatory Visit: Payer: Self-pay

## 2019-03-22 VITALS — BP 119/78 | HR 80 | Ht 66.0 in | Wt 147.0 lb

## 2019-03-22 DIAGNOSIS — Z87442 Personal history of urinary calculi: Secondary | ICD-10-CM | POA: Diagnosis not present

## 2019-03-22 DIAGNOSIS — N390 Urinary tract infection, site not specified: Secondary | ICD-10-CM

## 2019-03-22 DIAGNOSIS — R339 Retention of urine, unspecified: Secondary | ICD-10-CM

## 2019-03-22 LAB — BLADDER SCAN AMB NON-IMAGING

## 2019-03-22 NOTE — Progress Notes (Signed)
03/22/2019 3:37 PM   Jocelyn Hooper 11-Jan-1941 [id number]  Referring provider: Rusty Aus, MD [place] [facility] [place],  [place] [address]  Chief Complaint  Patient presents with  . Recurrent UTI    1year    HPI: 78 year old female with personal history of kidney stones and recurrent urinary tract infections who returns today for routine annual follow-up.  She was last seen on 03/16/2018.  Reports that she believes she had a stone episode about 3 months ago.  She had right flank pain for about a day or 2 and then it resolved spontaneously.  She did not see the stone pass.  She has no further episodes of flank pain.  Most recent KUB shows no obvious stone burden performed this month.  She does have a remote history of what sounds like ESWL.  She does have a personal history of urinary tract infections.  No documented urinary tract infections this year.  She believes that she may have had a urinary tract infection treated by her primary care, Dr. [name] Heck but cannot recall the timing.  She believes it is been quite some time ago.  Her last urinalysis on 01/02/2019 his office was negative.  She also gave a urine in October which was also negative.    Today, she denies any dysuria or gross hematuria.  She is concerned today about an issue that she is having in the morning time.  She reports that she generally gets up once a night to void.  Upon arising that morning, she has no issues.  However, if she does not wake up at night her bladder is very full in the morning, she sometimes has trouble emptying it.  She feels like she goes to the bathroom once, empties only a small amount and then has a large volume void about 15 minutes later.  PMH: Past Medical History:  Diagnosis Date  . Bursitis of left shoulder   . Chest pain, unspecified   . Chronic kidney disease    KIDNEY STONE CURRENTLY-08-2015-SEES DR COPE  . Complication of anesthesia    PT HAS  SEVERE SCOLIOSIS AND FOR TKR ANESTHESIA COULD NOT GET A SPINAL  . Coronary artery disease    ascvd  . CSF leak   . DDD (degenerative disc disease), cervical   . Diverticulosis   . Esophageal reflux   . Fibrocystic breast disease   . GERD (gastroesophageal reflux disease)   . Headache    H/O  . History of colon polyps   . History of hiatal hernia   . History of kidney stones   . IBS (irritable bowel syndrome)   . Nasal polyps   . Other and unspecified hyperlipidemia   . Scoliosis   . Tinnitus of both ears   . Trigeminal neuralgia syndrome   . Unspecified sinusitis (chronic)   . Vitamin B12 deficiency     Surgical History: Past Surgical History:  Procedure Laterality Date  . ABDOMINAL HYSTERECTOMY    . APPENDECTOMY    . BACK SURGERY  1982   disc  . [place]     left   . CATARACT EXTRACTION     right  . CHOLECYSTECTOMY  2002  . COLONOSCOPY WITH PROPOFOL N/A 03/10/2017   Procedure: COLONOSCOPY WITH PROPOFOL;  Surgeon: Manya Silvas, MD;  Location: [facility] ENDOSCOPY;  Service: Endoscopy;  Laterality: N/A;  . EYE SURGERY Bilateral  Cataract Extraction with IOL  . hysterectomy (other)  1974  . JOINT REPLACEMENT     LEFT knee  . KIDNEY STONE SURGERY    . KNEE ARTHROPLASTY Right 09/27/2017   Procedure: COMPUTER ASSISTED TOTAL KNEE ARTHROPLASTY;  Surgeon: Dereck Leep, MD;  Location: [facility] ORS;  Service: Orthopedics;  Laterality: Right;  . KNEE ARTHROSCOPY Right 09/13/2015   Procedure: ARTHROSCOPY KNEE, PARTIAL LATERAL MENISECTOMY, CHONDROPLASTY ;  Surgeon: Dereck Leep, MD;  Location: [facility] ORS;  Service: Orthopedics;  Laterality: Right;  . KNEE SURGERY  2004   having a lot of problems with left knee  . [place], 2003, 2012  . sinus procedures  1970,1990,1994,2005  . TONSILLECTOMY      Home Medications:  Allergies as of 03/22/2019      Reactions   Avelox [moxifloxacin Hcl In  Nacl] Other (See Comments)   Redness all over in about 5 minutes after taking   Codeine Nausea Only   REACTION: nausea   Acyclovir And Related    Shrimp [shellfish Allergy]    Hives    Ciprofloxacin Rash   Moxifloxacin Hives, Rash   Other reaction(s): OTHER      Medication List       Accurate as of March 22, 2019 11:59 PM. If you have any questions, ask your nurse or doctor.        STOP taking these medications   lansoprazole 30 MG capsule Commonly known as: PREVACID Stopped by: Hollice Espy, MD     TAKE these medications   aspirin EC 81 MG tablet Take 81 mg by mouth daily.   Caltrate 600+D 600-400 MG-UNIT tablet Generic drug: Calcium Carbonate-Vitamin D Take 1 tablet by mouth 2 (two) times daily.   Dexilant 60 MG capsule Generic drug: dexlansoprazole   estradiol 0.5 MG tablet Commonly known as: ESTRACE Take 0.5 mg by mouth daily.   fluticasone 50 MCG/ACT nasal spray Commonly known as: FLONASE Place 2 sprays into both nostrils daily.   gabapentin 300 MG capsule Commonly known as: NEURONTIN Take 300 mg by mouth at bedtime.   STOOL SOFTENER PO Take 1 tablet by mouth daily.   Topamax 100 MG tablet Generic drug: topiramate Take 150 mg by mouth at bedtime.   vitamin C 500 MG tablet Commonly known as: ASCORBIC ACID Take 1,000 mg by mouth 2 (two) times daily.       Allergies:  Allergies  Allergen Reactions  . Avelox [Moxifloxacin Hcl In Nacl] Other (See Comments)    Redness all over in about 5 minutes after taking  . Codeine Nausea Only    REACTION: nausea  . Acyclovir And Related   . Shrimp [Shellfish Allergy]     Hives    . Ciprofloxacin Rash  . Moxifloxacin Hives and Rash    Other reaction(s): OTHER    Family History: Family History  Problem Relation Age of Onset  . Heart attack Mother 65  . Heart failure Mother   . Heart attack Father   . Heart disease Father        CABG  . Heart attack Brother 66       premature    Social  History:  reports that she has never smoked. She has never used smokeless tobacco. She reports that she does not drink alcohol or use drugs.  ROS: UROLOGY Frequent Urination?: No Hard to postpone urination?: Yes Burning/pain with urination?: No Get up at night to urinate?: Yes Leakage of urine?:  No Urine stream starts and stops?: No Trouble starting stream?: No Do you have to strain to urinate?: No Blood in urine?: No Urinary tract infection?: No Sexually transmitted disease?: No Injury to kidneys or bladder?: No Painful intercourse?: No Weak stream?: No Currently pregnant?: No Vaginal bleeding?: No Last menstrual period?: n  Gastrointestinal Nausea?: No Vomiting?: No Indigestion/heartburn?: No Diarrhea?: No Constipation?: No  Constitutional Fever: No Night sweats?: No Weight loss?: No Fatigue?: No  Skin Skin rash/lesions?: No Itching?: No  Eyes Blurred vision?: No Double vision?: No  Ears/Nose/Throat Sore throat?: No Sinus problems?: No  Hematologic/Lymphatic Swollen glands?: No Easy bruising?: No  Cardiovascular Leg swelling?: No Chest pain?: No  Respiratory Cough?: No Shortness of breath?: No  Endocrine Excessive thirst?: No  Musculoskeletal Back pain?: Yes Joint pain?: No  Neurological Headaches?: No Dizziness?: No  Psychologic Depression?: No Anxiety?: No  Physical Exam: BP 119/78   Pulse 80   Ht 5\' 6"  (1.676 m)   Wt 147 lb (66.7 kg)   BMI 23.73 kg/m   Constitutional:  Alert and oriented, No acute distress. HEENT: [place] AT, moist mucus membranes.  Trachea midline, no masses. Cardiovascular: No clubbing, cyanosis, or edema. Respiratory: Normal respiratory effort, no increased work of breathing. Skin: No rashes, bruises or suspicious lesions. Neurologic: Grossly intact, no focal deficits, moving all 4 extremities. Psychiatric: Normal mood and affect.  Laboratory Data: Lab Results  Component Value Date   WBC 6.5 09/16/2017    HGB 14.1 09/16/2017   HCT 42.2 09/16/2017   MCV 95.2 09/16/2017   PLT 247 09/16/2017    Lab Results  Component Value Date   CREATININE 0.99 09/16/2017     Lab Results  Component Value Date   HGBA1C 5.1 10/07/2016     Pertinent Imaging: Results for orders placed or performed in visit on 03/22/19  BLADDER SCAN AMB NON-IMAGING  Result Value Ref Range   Scan Result 85ml    CLINICAL DATA:  History of kidney stones.  EXAM: ABDOMEN - 1 VIEW  COMPARISON:  August 16, 2013  FINDINGS: The bowel gas pattern is normal. Small focal residual densities are probably identified in colon diverticula specially in the pelvis. No radio-opaque calculi or other significant radiographic abnormality are seen.  IMPRESSION: No radio-opaque calculi or other significant radiographic abnormality are seen.   Electronically Signed   By: Abelardo Diesel M.D.   On: 03/09/2019 15:45  KUB reviewed, no evidence of stones on this image.  Agree with radiologic interpretation.  Assessment & Plan:    1. Recurrent UTI No infections this year, doing well PVR minimal Return if becomes symptomatic - BLADDER SCAN AMB NON-IMAGING  2. History of kidney stones No evidence of stones No need to further imaging at this time  3. Incomplete bladder emptying Advised double void in the AM Adequate bladder emtpying  Return if symptoms worsen or fail to improve.  Hollice Espy, MD  [facility] [address] [place], [place] [address] [phone]

## 2019-05-26 DIAGNOSIS — M549 Dorsalgia, unspecified: Secondary | ICD-10-CM | POA: Diagnosis not present

## 2019-05-26 DIAGNOSIS — M62830 Muscle spasm of back: Secondary | ICD-10-CM | POA: Diagnosis not present

## 2019-05-31 ENCOUNTER — Ambulatory Visit: Payer: PPO | Admitting: Podiatry

## 2019-05-31 ENCOUNTER — Other Ambulatory Visit: Payer: Self-pay

## 2019-05-31 ENCOUNTER — Encounter: Payer: Self-pay | Admitting: Podiatry

## 2019-05-31 DIAGNOSIS — M79676 Pain in unspecified toe(s): Secondary | ICD-10-CM | POA: Diagnosis not present

## 2019-05-31 DIAGNOSIS — B351 Tinea unguium: Secondary | ICD-10-CM

## 2019-05-31 NOTE — Progress Notes (Signed)
Subjective:  Patient ID: Jocelyn Hooper, female    DOB: 07-07-1941,  MRN: [id number] HPI Chief Complaint  Patient presents with  . Nail Problem    Hallux nail right - toenail thick and dark, tender with shoes  . New Patient (Initial Visit)    Est pt 05/2016    78 y.o. female presents with the above complaint.   ROS: Denies fever chills nausea vomiting muscle aches pains calf pain back pain chest pain shortness of breath.  Past Medical History:  Diagnosis Date  . Bursitis of left shoulder   . Chest pain, unspecified   . Chronic kidney disease    KIDNEY STONE CURRENTLY-08-2015-SEES DR COPE  . Complication of anesthesia    PT HAS SEVERE SCOLIOSIS AND FOR TKR ANESTHESIA COULD NOT GET A SPINAL  . Coronary artery disease    ascvd  . CSF leak   . DDD (degenerative disc disease), cervical   . Diverticulosis   . Esophageal reflux   . Fibrocystic breast disease   . GERD (gastroesophageal reflux disease)   . Headache    H/O  . History of colon polyps   . History of hiatal hernia   . History of kidney stones   . IBS (irritable bowel syndrome)   . Nasal polyps   . Other and unspecified hyperlipidemia   . Scoliosis   . Tinnitus of both ears   . Trigeminal neuralgia syndrome   . Unspecified sinusitis (chronic)   . Vitamin B12 deficiency    Past Surgical History:  Procedure Laterality Date  . ABDOMINAL HYSTERECTOMY    . APPENDECTOMY    . BACK SURGERY  1982   disc  . [place]     left   . CATARACT EXTRACTION     right  . CHOLECYSTECTOMY  2002  . COLONOSCOPY WITH PROPOFOL N/A 03/10/2017   Procedure: COLONOSCOPY WITH PROPOFOL;  Surgeon: Manya Silvas, MD;  Location[facility] ENDOSCOPY;  Service: Endoscopy;  Laterality: N/A;  . EYE SURGERY Bilateral    Cataract Extraction with IOL  . hysterectomy (other)  1974  . JOINT REPLACEMENT     LEFT knee  . KIDNEY STONE SURGERY    . KNEE ARTHROPLASTY Right 09/27/2017   Procedure: COMPUTER ASSISTED TOTAL KNEE ARTHROPLASTY;  Surgeon: Dereck Leep, MD;  Location: [facility] ORS;  Service: Orthopedics;  Laterality: Right;  . KNEE ARTHROSCOPY Right 09/13/2015   Procedure: ARTHROSCOPY KNEE, PARTIAL LATERAL MENISECTOMY, CHONDROPLASTY ;  Surgeon: Dereck Leep, MD;  Location: [facility] ORS;  Service: Orthopedics;  Laterality: Right;  . KNEE SURGERY  2004   having a lot of problems with left knee  . [place], 2003, 2012  . sinus procedures  1970,1990,1994,2005  . TONSILLECTOMY      Current Outpatient Medications:  .  aspirin EC 81 MG tablet, Take 81 mg by mouth daily., Disp: , Rfl:  .  Calcium Carbonate-Vitamin D (CALTRATE 600+D) 600-400 MG-UNIT per tablet, Take 1 tablet by mouth 2 (two) times daily.  , Disp: , Rfl:  .  DEXILANT 60 MG capsule, , Disp: , Rfl:  .  Docusate Calcium (STOOL SOFTENER PO), Take 1 tablet by mouth daily. , Disp: , Rfl:  .  estradiol (ESTRACE) 0.5 MG tablet, Take 0.5 mg by mouth daily., Disp: , Rfl:  .  famotidine (PEPCID) 40 MG tablet, , Disp: , Rfl:  .  fluticasone (FLONASE) 50 MCG/ACT  nasal spray, Place 2 sprays into both nostrils daily. , Disp: , Rfl:  .  gabapentin (NEURONTIN) 300 MG capsule, Take 300 mg by mouth at bedtime. , Disp: , Rfl:  .  topiramate (TOPAMAX) 100 MG tablet, Take 150 mg by mouth at bedtime. , Disp: , Rfl:  .  topiramate (TOPAMAX) 50 MG tablet, , Disp: , Rfl:  .  vitamin C (ASCORBIC ACID) 500 MG tablet, Take 1,000 mg by mouth 2 (two) times daily. , Disp: , Rfl:   Allergies  Allergen Reactions  . Avelox [Moxifloxacin Hcl In Nacl] Other (See Comments)    Redness all over in about 5 minutes after taking  . Codeine Nausea Only    REACTION: nausea  . Acyclovir And Related   . Shrimp [Shellfish Allergy]     Hives    . Ciprofloxacin Rash  . Moxifloxacin Hives and Rash    Other reaction(s): OTHER   Review of Systems Objective:  There were no vitals filed for this visit.  General: Well  developed, nourished, in no acute distress, alert and oriented x3   Dermatological: Skin is warm, dry and supple bilateral. Nails x 10 are well maintained; remaining integument appears unremarkable at this time. There are no open sores, no preulcerative lesions, no rash or signs of infection present.  Vascular: Dorsalis Pedis artery and Posterior Tibial artery pedal pulses are 2/4 bilateral with immedate capillary fill time. Pedal hair growth present. No varicosities and no lower extremity edema present bilateral.   Neruologic: Grossly intact via light touch bilateral. Vibratory intact via tuning fork bilateral. Protective threshold with Semmes Wienstein monofilament intact to all pedal sites bilateral. Patellar and Achilles deep tendon reflexes 2+ bilateral. No Babinski or clonus noted bilateral.   Musculoskeletal: No gross boney pedal deformities bilateral. No pain, crepitus, or limitation noted with foot and ankle range of motion bilateral. Muscular strength 5/5 in all groups tested bilateral.  Gait: Unassisted, Nonantalgic.    Radiographs:  None taken  Assessment & Plan:   Assessment: Painful ingrown toenail hallux right  Plan: Sharply debrided reactive hyperkeratotic margin and mycotic nail today.  Follow-up with her as needed.     Barack Nicodemus T. [place], [place]

## 2019-06-01 DIAGNOSIS — D2261 Melanocytic nevi of right upper limb, including shoulder: Secondary | ICD-10-CM | POA: Diagnosis not present

## 2019-06-01 DIAGNOSIS — D2272 Melanocytic nevi of left lower limb, including hip: Secondary | ICD-10-CM | POA: Diagnosis not present

## 2019-06-01 DIAGNOSIS — D485 Neoplasm of uncertain behavior of skin: Secondary | ICD-10-CM | POA: Diagnosis not present

## 2019-06-01 DIAGNOSIS — R208 Other disturbances of skin sensation: Secondary | ICD-10-CM | POA: Diagnosis not present

## 2019-06-01 DIAGNOSIS — C44722 Squamous cell carcinoma of skin of right lower limb, including hip: Secondary | ICD-10-CM | POA: Diagnosis not present

## 2019-06-01 DIAGNOSIS — X32XXXA Exposure to sunlight, initial encounter: Secondary | ICD-10-CM | POA: Diagnosis not present

## 2019-06-01 DIAGNOSIS — Z85828 Personal history of other malignant neoplasm of skin: Secondary | ICD-10-CM | POA: Diagnosis not present

## 2019-06-01 DIAGNOSIS — D0462 Carcinoma in situ of skin of left upper limb, including shoulder: Secondary | ICD-10-CM | POA: Diagnosis not present

## 2019-06-01 DIAGNOSIS — L821 Other seborrheic keratosis: Secondary | ICD-10-CM | POA: Diagnosis not present

## 2019-06-01 DIAGNOSIS — L570 Actinic keratosis: Secondary | ICD-10-CM | POA: Diagnosis not present

## 2019-06-01 DIAGNOSIS — D2262 Melanocytic nevi of left upper limb, including shoulder: Secondary | ICD-10-CM | POA: Diagnosis not present

## 2019-06-07 DIAGNOSIS — C44722 Squamous cell carcinoma of skin of right lower limb, including hip: Secondary | ICD-10-CM | POA: Diagnosis not present

## 2019-06-14 DIAGNOSIS — M5416 Radiculopathy, lumbar region: Secondary | ICD-10-CM | POA: Diagnosis not present

## 2019-06-14 DIAGNOSIS — M5136 Other intervertebral disc degeneration, lumbar region: Secondary | ICD-10-CM | POA: Diagnosis not present

## 2019-06-28 DIAGNOSIS — E538 Deficiency of other specified B group vitamins: Secondary | ICD-10-CM | POA: Diagnosis not present

## 2019-06-28 DIAGNOSIS — E782 Mixed hyperlipidemia: Secondary | ICD-10-CM | POA: Diagnosis not present

## 2019-07-05 ENCOUNTER — Other Ambulatory Visit: Payer: Self-pay | Admitting: Internal Medicine

## 2019-07-05 DIAGNOSIS — E782 Mixed hyperlipidemia: Secondary | ICD-10-CM | POA: Diagnosis not present

## 2019-07-05 DIAGNOSIS — Z0000 Encounter for general adult medical examination without abnormal findings: Secondary | ICD-10-CM | POA: Diagnosis not present

## 2019-07-05 DIAGNOSIS — E538 Deficiency of other specified B group vitamins: Secondary | ICD-10-CM | POA: Diagnosis not present

## 2019-07-05 DIAGNOSIS — Z1231 Encounter for screening mammogram for malignant neoplasm of breast: Secondary | ICD-10-CM

## 2019-07-13 ENCOUNTER — Encounter (INDEPENDENT_AMBULATORY_CARE_PROVIDER_SITE_OTHER): Payer: Self-pay

## 2019-07-13 ENCOUNTER — Other Ambulatory Visit: Payer: Self-pay

## 2019-07-13 ENCOUNTER — Ambulatory Visit
Admission: RE | Admit: 2019-07-13 | Discharge: 2019-07-13 | Disposition: A | Discharge status: Home / Self Care | Payer: PPO | Source: Ambulatory Visit | Attending: Internal Medicine | Admitting: Internal Medicine

## 2019-07-13 DIAGNOSIS — Z1231 Encounter for screening mammogram for malignant neoplasm of breast: Secondary | ICD-10-CM | POA: Diagnosis not present

## 2019-07-17 ENCOUNTER — Inpatient Hospital Stay
Admission: RE | Admit: 2019-07-17 | Discharge: 2019-07-17 | Disposition: A | Discharge status: Home / Self Care | Payer: Self-pay | Source: Ambulatory Visit | Attending: *Deleted | Admitting: *Deleted

## 2019-07-17 ENCOUNTER — Other Ambulatory Visit: Payer: Self-pay | Admitting: Internal Medicine

## 2019-07-17 ENCOUNTER — Inpatient Hospital Stay
Admission: RE | Admit: 2019-07-17 | Discharge: 2019-07-17 | Disposition: A | Discharge status: Home / Self Care | Payer: Self-pay | Source: Ambulatory Visit | Attending: Internal Medicine | Admitting: Internal Medicine

## 2019-07-17 ENCOUNTER — Other Ambulatory Visit: Payer: Self-pay | Admitting: *Deleted

## 2019-07-17 DIAGNOSIS — Z1231 Encounter for screening mammogram for malignant neoplasm of breast: Secondary | ICD-10-CM

## 2019-07-26 DIAGNOSIS — G43019 Migraine without aura, intractable, without status migrainosus: Secondary | ICD-10-CM | POA: Diagnosis not present

## 2019-07-26 DIAGNOSIS — G500 Trigeminal neuralgia: Secondary | ICD-10-CM | POA: Diagnosis not present

## 2019-08-28 DIAGNOSIS — M7582 Other shoulder lesions, left shoulder: Secondary | ICD-10-CM | POA: Diagnosis not present

## 2019-09-26 DIAGNOSIS — M545 Low back pain: Secondary | ICD-10-CM | POA: Diagnosis not present

## 2019-09-26 DIAGNOSIS — M25551 Pain in right hip: Secondary | ICD-10-CM | POA: Diagnosis not present

## 2019-09-26 DIAGNOSIS — Z96653 Presence of artificial knee joint, bilateral: Secondary | ICD-10-CM | POA: Diagnosis not present

## 2019-09-26 DIAGNOSIS — M5441 Lumbago with sciatica, right side: Secondary | ICD-10-CM | POA: Diagnosis not present

## 2019-09-26 DIAGNOSIS — M170 Bilateral primary osteoarthritis of knee: Secondary | ICD-10-CM | POA: Diagnosis not present

## 2019-09-26 DIAGNOSIS — G8929 Other chronic pain: Secondary | ICD-10-CM | POA: Diagnosis not present

## 2019-10-16 ENCOUNTER — Ambulatory Visit: Payer: PPO | Attending: Internal Medicine

## 2019-10-16 DIAGNOSIS — Z20822 Contact with and (suspected) exposure to covid-19: Secondary | ICD-10-CM | POA: Diagnosis not present

## 2019-10-17 ENCOUNTER — Telehealth: Payer: Self-pay | Admitting: General Practice

## 2019-10-17 LAB — NOVEL CORONAVIRUS, NAA: SARS-CoV-2, NAA: NOT DETECTED

## 2019-10-17 NOTE — Telephone Encounter (Signed)
Gave patient negative covid test results Patient understood 

## 2019-10-27 DIAGNOSIS — M5416 Radiculopathy, lumbar region: Secondary | ICD-10-CM | POA: Diagnosis not present

## 2019-10-27 DIAGNOSIS — M5136 Other intervertebral disc degeneration, lumbar region: Secondary | ICD-10-CM | POA: Diagnosis not present

## 2019-11-08 DIAGNOSIS — R5382 Chronic fatigue, unspecified: Secondary | ICD-10-CM | POA: Diagnosis not present

## 2019-11-08 DIAGNOSIS — E538 Deficiency of other specified B group vitamins: Secondary | ICD-10-CM | POA: Diagnosis not present

## 2019-11-08 DIAGNOSIS — R509 Fever, unspecified: Secondary | ICD-10-CM | POA: Diagnosis not present

## 2019-11-27 DIAGNOSIS — M62830 Muscle spasm of back: Secondary | ICD-10-CM | POA: Diagnosis not present

## 2019-11-27 DIAGNOSIS — M5416 Radiculopathy, lumbar region: Secondary | ICD-10-CM | POA: Diagnosis not present

## 2019-11-27 DIAGNOSIS — M5136 Other intervertebral disc degeneration, lumbar region: Secondary | ICD-10-CM | POA: Diagnosis not present

## 2019-11-28 DIAGNOSIS — M5416 Radiculopathy, lumbar region: Secondary | ICD-10-CM | POA: Diagnosis not present

## 2019-11-28 DIAGNOSIS — M5136 Other intervertebral disc degeneration, lumbar region: Secondary | ICD-10-CM | POA: Diagnosis not present

## 2019-12-07 DIAGNOSIS — D485 Neoplasm of uncertain behavior of skin: Secondary | ICD-10-CM | POA: Diagnosis not present

## 2019-12-07 DIAGNOSIS — D2262 Melanocytic nevi of left upper limb, including shoulder: Secondary | ICD-10-CM | POA: Diagnosis not present

## 2019-12-07 DIAGNOSIS — C44722 Squamous cell carcinoma of skin of right lower limb, including hip: Secondary | ICD-10-CM | POA: Diagnosis not present

## 2019-12-07 DIAGNOSIS — L821 Other seborrheic keratosis: Secondary | ICD-10-CM | POA: Diagnosis not present

## 2019-12-07 DIAGNOSIS — X32XXXA Exposure to sunlight, initial encounter: Secondary | ICD-10-CM | POA: Diagnosis not present

## 2019-12-07 DIAGNOSIS — D2272 Melanocytic nevi of left lower limb, including hip: Secondary | ICD-10-CM | POA: Diagnosis not present

## 2019-12-07 DIAGNOSIS — D225 Melanocytic nevi of trunk: Secondary | ICD-10-CM | POA: Diagnosis not present

## 2019-12-07 DIAGNOSIS — D2261 Melanocytic nevi of right upper limb, including shoulder: Secondary | ICD-10-CM | POA: Diagnosis not present

## 2019-12-07 DIAGNOSIS — D2271 Melanocytic nevi of right lower limb, including hip: Secondary | ICD-10-CM | POA: Diagnosis not present

## 2019-12-07 DIAGNOSIS — L570 Actinic keratosis: Secondary | ICD-10-CM | POA: Diagnosis not present

## 2019-12-27 DIAGNOSIS — E782 Mixed hyperlipidemia: Secondary | ICD-10-CM | POA: Diagnosis not present

## 2020-01-03 DIAGNOSIS — E782 Mixed hyperlipidemia: Secondary | ICD-10-CM | POA: Diagnosis not present

## 2020-01-03 DIAGNOSIS — Z0000 Encounter for general adult medical examination without abnormal findings: Secondary | ICD-10-CM | POA: Diagnosis not present

## 2020-01-03 DIAGNOSIS — M5136 Other intervertebral disc degeneration, lumbar region: Secondary | ICD-10-CM | POA: Diagnosis not present

## 2020-01-03 DIAGNOSIS — E538 Deficiency of other specified B group vitamins: Secondary | ICD-10-CM | POA: Diagnosis not present

## 2020-01-04 DIAGNOSIS — C44722 Squamous cell carcinoma of skin of right lower limb, including hip: Secondary | ICD-10-CM | POA: Diagnosis not present

## 2020-02-02 ENCOUNTER — Telehealth (INDEPENDENT_AMBULATORY_CARE_PROVIDER_SITE_OTHER): Payer: Self-pay | Admitting: Vascular Surgery

## 2020-02-09 ENCOUNTER — Other Ambulatory Visit (HOSPITAL_COMMUNITY): Payer: Self-pay | Admitting: Surgery

## 2020-02-09 ENCOUNTER — Other Ambulatory Visit: Payer: Self-pay | Admitting: Surgery

## 2020-02-09 DIAGNOSIS — M7582 Other shoulder lesions, left shoulder: Secondary | ICD-10-CM

## 2020-02-16 DIAGNOSIS — M545 Low back pain: Secondary | ICD-10-CM | POA: Diagnosis not present

## 2020-02-16 DIAGNOSIS — G629 Polyneuropathy, unspecified: Secondary | ICD-10-CM | POA: Diagnosis not present

## 2020-02-16 DIAGNOSIS — R600 Localized edema: Secondary | ICD-10-CM | POA: Diagnosis not present

## 2020-02-16 DIAGNOSIS — I781 Nevus, non-neoplastic: Secondary | ICD-10-CM | POA: Diagnosis not present

## 2020-02-26 ENCOUNTER — Ambulatory Visit
Admission: RE | Admit: 2020-02-26 | Discharge: 2020-02-26 | Disposition: A | Discharge status: Home / Self Care | Payer: PPO | Source: Ambulatory Visit | Attending: Surgery | Admitting: Surgery

## 2020-02-26 ENCOUNTER — Other Ambulatory Visit: Payer: Self-pay

## 2020-02-26 DIAGNOSIS — M7582 Other shoulder lesions, left shoulder: Secondary | ICD-10-CM | POA: Insufficient documentation

## 2020-02-26 DIAGNOSIS — M19012 Primary osteoarthritis, left shoulder: Secondary | ICD-10-CM | POA: Diagnosis not present

## 2020-02-26 DIAGNOSIS — M75112 Incomplete rotator cuff tear or rupture of left shoulder, not specified as traumatic: Secondary | ICD-10-CM | POA: Diagnosis not present

## 2020-03-04 DIAGNOSIS — R600 Localized edema: Secondary | ICD-10-CM | POA: Diagnosis not present

## 2020-03-04 DIAGNOSIS — I5032 Chronic diastolic (congestive) heart failure: Secondary | ICD-10-CM | POA: Diagnosis not present

## 2020-04-05 DIAGNOSIS — R600 Localized edema: Secondary | ICD-10-CM | POA: Diagnosis not present

## 2020-04-05 DIAGNOSIS — I5032 Chronic diastolic (congestive) heart failure: Secondary | ICD-10-CM | POA: Diagnosis not present

## 2020-04-09 DIAGNOSIS — I5041 Acute combined systolic (congestive) and diastolic (congestive) heart failure: Secondary | ICD-10-CM | POA: Diagnosis not present

## 2020-04-09 DIAGNOSIS — I38 Endocarditis, valve unspecified: Secondary | ICD-10-CM | POA: Diagnosis not present

## 2020-04-09 DIAGNOSIS — G589 Mononeuropathy, unspecified: Secondary | ICD-10-CM | POA: Diagnosis not present

## 2020-04-22 DIAGNOSIS — M7582 Other shoulder lesions, left shoulder: Secondary | ICD-10-CM | POA: Diagnosis not present

## 2020-05-06 DIAGNOSIS — M7582 Other shoulder lesions, left shoulder: Secondary | ICD-10-CM | POA: Diagnosis not present

## 2020-05-28 ENCOUNTER — Other Ambulatory Visit: Payer: Self-pay | Admitting: Internal Medicine

## 2020-05-28 DIAGNOSIS — Z1231 Encounter for screening mammogram for malignant neoplasm of breast: Secondary | ICD-10-CM

## 2020-06-13 DIAGNOSIS — R0982 Postnasal drip: Secondary | ICD-10-CM | POA: Diagnosis not present

## 2020-06-13 DIAGNOSIS — J324 Chronic pansinusitis: Secondary | ICD-10-CM | POA: Diagnosis not present

## 2020-06-19 DIAGNOSIS — D044 Carcinoma in situ of skin of scalp and neck: Secondary | ICD-10-CM | POA: Diagnosis not present

## 2020-06-19 DIAGNOSIS — Z85828 Personal history of other malignant neoplasm of skin: Secondary | ICD-10-CM | POA: Diagnosis not present

## 2020-06-19 DIAGNOSIS — Z08 Encounter for follow-up examination after completed treatment for malignant neoplasm: Secondary | ICD-10-CM | POA: Diagnosis not present

## 2020-06-19 DIAGNOSIS — L821 Other seborrheic keratosis: Secondary | ICD-10-CM | POA: Diagnosis not present

## 2020-06-19 DIAGNOSIS — D485 Neoplasm of uncertain behavior of skin: Secondary | ICD-10-CM | POA: Diagnosis not present

## 2020-07-03 DIAGNOSIS — E538 Deficiency of other specified B group vitamins: Secondary | ICD-10-CM | POA: Diagnosis not present

## 2020-07-03 DIAGNOSIS — E782 Mixed hyperlipidemia: Secondary | ICD-10-CM | POA: Diagnosis not present

## 2020-07-08 DIAGNOSIS — R0982 Postnasal drip: Secondary | ICD-10-CM | POA: Diagnosis not present

## 2020-07-08 DIAGNOSIS — J324 Chronic pansinusitis: Secondary | ICD-10-CM | POA: Diagnosis not present

## 2020-07-09 DIAGNOSIS — M25512 Pain in left shoulder: Secondary | ICD-10-CM | POA: Diagnosis not present

## 2020-07-09 DIAGNOSIS — M75102 Unspecified rotator cuff tear or rupture of left shoulder, not specified as traumatic: Secondary | ICD-10-CM | POA: Diagnosis not present

## 2020-07-09 NOTE — Progress Notes (Signed)
07/10/2020 11:11 AM   Jocelyn Hooper 1940/11/24 [id number]  Referring provider: Rusty Aus, MD [facility] [place],  [place] [address] Chief Complaint  Patient presents with   Recurrent UTI    HPI: Jocelyn Hooper is a 79 y.o. female who returns for a 1 year follow up of rUTI, history of kidney stones and incomplete bladder emptying. She was last seen on 03/22/2019. Patient would like to discuss bladder leakage today.   KUB from 03/08/2020 showed no radio-opaque calculi or other significant radiographic abnormality are seen.  Multiple documented UA's over this past year with no signs of infection.   Bladder scan by PVR is 9 mL today.   She reports increased in urgency, leakage and accidents. Urinary accidents are a new onset problem. She has little to no night time symptoms. Her day time symptom are becoming increasing worse. She is drinking water and coffee. No hematuria or dysuria. Patient denies stress incontinence.   She reports chronic constipation. Patient is taking stool softeners.   She does have a personal history of urinary tract infections.   PMH: Past Medical History:  Diagnosis Date   Bursitis of left shoulder    Chest pain, unspecified    Chronic kidney disease    KIDNEY STONE [id number] DR COPE   Complication of anesthesia    PT HAS SEVERE SCOLIOSIS AND FOR TKR ANESTHESIA COULD NOT GET A SPINAL   Coronary artery disease    ascvd   CSF leak    DDD (degenerative disc disease), cervical    Diverticulosis    Esophageal reflux    Fibrocystic breast disease    GERD (gastroesophageal reflux disease)    Headache    H/O   History of colon polyps    History of hiatal hernia    History of kidney stones    IBS (irritable bowel syndrome)    Nasal polyps    Other and unspecified hyperlipidemia    Scoliosis    Tinnitus of both ears    Trigeminal neuralgia syndrome    Unspecified  sinusitis (chronic)    Vitamin B12 deficiency     Surgical History: Past Surgical History:  Procedure Laterality Date   ABDOMINAL HYSTERECTOMY     APPENDECTOMY     BACK SURGERY  1982   disc   CARDIAC CATHETERIZATION     [place]    CATARACT EXTRACTION     left    CATARACT EXTRACTION     right   CHOLECYSTECTOMY  2002   COLONOSCOPY WITH PROPOFOL N/A 03/10/2017   Procedure: COLONOSCOPY WITH PROPOFOL;  Surgeon: Manya Silvas, MD;  Location: [facility] ENDOSCOPY;  Service: Endoscopy;  Laterality: N/A;   EYE SURGERY Bilateral    Cataract Extraction with IOL   hysterectomy (other)  1974   JOINT REPLACEMENT     LEFT knee   KIDNEY STONE SURGERY     KNEE ARTHROPLASTY Right 09/27/2017   Procedure: COMPUTER ASSISTED TOTAL KNEE ARTHROPLASTY;  Surgeon: Dereck Leep, MD;  Location: [facility] ORS;  Service: Orthopedics;  Laterality: Right;   KNEE ARTHROSCOPY Right 09/13/2015   Procedure: ARTHROSCOPY KNEE, PARTIAL LATERAL MENISECTOMY, CHONDROPLASTY ;  Surgeon: Dereck Leep, MD;  Location: [facility] ORS;  Service: Orthopedics;  Laterality: Right;   KNEE SURGERY  2004   having a lot of problems with left knee   [place], 2003, 2012   sinus procedures  1970,1990,1994,2005   TONSILLECTOMY  Home Medications:  Allergies as of 07/10/2020      Reactions   Avelox [moxifloxacin Hcl In Nacl] Other (See Comments)   Redness all over in about 5 minutes after taking   Codeine Nausea Only   REACTION: nausea   Acyclovir And Related    Shrimp [shellfish Allergy]    Hives    Ciprofloxacin Rash   Moxifloxacin Hives, Rash   Other reaction(s): OTHER      Medication List       Accurate as of July 10, 2020 11:59 PM. If you have any questions, ask your nurse or doctor.        STOP taking these medications   famotidine 40 MG tablet Commonly known as: PEPCID Stopped by: Hollice Espy, MD     TAKE these medications   aspirin EC 81 MG  tablet Take 81 mg by mouth daily.   Caltrate 600+D 600-400 MG-UNIT tablet Generic drug: Calcium Carbonate-Vitamin D Take 1 tablet by mouth 2 (two) times daily.   Dexilant 30 MG capsule Generic drug: Dexlansoprazole Take 1 capsule by mouth daily. What changed: Another medication with the same name was removed. Continue taking this medication, and follow the directions you see here. Changed by: Hollice Espy, MD   estradiol 0.5 MG tablet Commonly known as: ESTRACE Take 0.5 mg by mouth daily.   fluticasone 50 MCG/ACT nasal spray Commonly known as: FLONASE Place 2 sprays into both nostrils daily.   furosemide 20 MG tablet Commonly known as: LASIX Take 20 mg by mouth 2 (two) times daily.   gabapentin 300 MG capsule Commonly known as: NEURONTIN Take 300 mg by mouth at bedtime.   STOOL SOFTENER PO Take 1 tablet by mouth daily.   tamsulosin 0.4 MG Caps capsule Commonly known as: FLOMAX Take by mouth.   topiramate 50 MG tablet Commonly known as: TOPAMAX Take by mouth. What changed: Another medication with the same name was removed. Continue taking this medication, and follow the directions you see here. Changed by: Hollice Espy, MD   vitamin C 500 MG tablet Commonly known as: ASCORBIC ACID Take 1,000 mg by mouth 2 (two) times daily.       Allergies:  Allergies  Allergen Reactions   Avelox [Moxifloxacin Hcl In Nacl] Other (See Comments)    Redness all over in about 5 minutes after taking   Codeine Nausea Only    REACTION: nausea   Acyclovir And Related    Shrimp [Shellfish Allergy]     Hives     Ciprofloxacin Rash   Moxifloxacin Hives and Rash    Other reaction(s): OTHER    Family History: Family History  Problem Relation Age of Onset   Heart attack Mother 98   Heart failure Mother    Heart attack Father    Heart disease Father        CABG   Heart attack Brother 69       premature   Breast cancer Sister 17    Social History:  reports  that she has never smoked. She has never used smokeless tobacco. She reports that she does not drink alcohol and does not use drugs.   Physical Exam: BP 116/78    Pulse 73    Ht 5\' 5"  (1.651 m)    Wt 145 lb (65.8 kg)    BMI 24.13 kg/m   Constitutional:  Alert and oriented, No acute distress. HEENT: [place] AT, moist mucus membranes.  Trachea midline, no masses. Cardiovascular: No clubbing, cyanosis, or  edema. Respiratory: Normal respiratory effort, no increased work of breathing. Skin: No rashes, bruises or suspicious lesions. Neurologic: Grossly intact, no focal deficits, moving all 4 extremities. Psychiatric: Normal mood and affect.  Laboratory Data:  Urinalysis Negative- see epic  Pertinent Imaging: Results for orders placed or performed in visit on 07/10/20  BLADDER SCAN AMB NON-IMAGING  Result Value Ref Range   Scan Result 9 ml     Assessment & Plan:    1. Urgency/Urge incontinence Multiple documented UA's over this past year with no signs of infection.  UA today is negative which is reassuring.  Behavioral modification discussed, cut back on morning coffee Discussed behavioral modifications   -Given 4 Myrbetriq 25 mg daily, # 28 samples; I have advised the patient of the side effects of Myrbetriq, such as: elevation in BP, urinary retention and/or HA   2. Constipation  Discussed behavioral modifications.  Continue stool softeners.  Follow-up in 1 month with PA for PVR, symptom recheck  [facility] [address], [place], [place] [address] [phone]  I, Selena Batten, am acting as a scribe for Dr. Hollice Espy.  I have reviewed the above documentation for accuracy and completeness, and I agree with the above.   Hollice Espy, MD

## 2020-07-10 ENCOUNTER — Other Ambulatory Visit: Payer: Self-pay

## 2020-07-10 ENCOUNTER — Ambulatory Visit: Payer: PPO | Admitting: Urology

## 2020-07-10 VITALS — BP 116/78 | HR 73 | Ht 65.0 in | Wt 145.0 lb

## 2020-07-10 DIAGNOSIS — N390 Urinary tract infection, site not specified: Secondary | ICD-10-CM

## 2020-07-10 DIAGNOSIS — Z780 Asymptomatic menopausal state: Secondary | ICD-10-CM | POA: Diagnosis not present

## 2020-07-10 DIAGNOSIS — R339 Retention of urine, unspecified: Secondary | ICD-10-CM | POA: Diagnosis not present

## 2020-07-10 DIAGNOSIS — Z0000 Encounter for general adult medical examination without abnormal findings: Secondary | ICD-10-CM | POA: Diagnosis not present

## 2020-07-10 DIAGNOSIS — E538 Deficiency of other specified B group vitamins: Secondary | ICD-10-CM | POA: Diagnosis not present

## 2020-07-10 DIAGNOSIS — I5041 Acute combined systolic (congestive) and diastolic (congestive) heart failure: Secondary | ICD-10-CM | POA: Diagnosis not present

## 2020-07-10 DIAGNOSIS — E782 Mixed hyperlipidemia: Secondary | ICD-10-CM | POA: Diagnosis not present

## 2020-07-10 DIAGNOSIS — I38 Endocarditis, valve unspecified: Secondary | ICD-10-CM | POA: Diagnosis not present

## 2020-07-10 LAB — BLADDER SCAN AMB NON-IMAGING: Scan Result: 9

## 2020-07-11 LAB — MICROSCOPIC EXAMINATION

## 2020-07-11 LAB — URINALYSIS, COMPLETE
Bilirubin, UA: NEGATIVE
Glucose, UA: NEGATIVE
Leukocytes,UA: NEGATIVE
Nitrite, UA: NEGATIVE
Protein,UA: NEGATIVE
RBC, UA: NEGATIVE
Specific Gravity, UA: 1.025 (ref 1.005–1.030)
Urobilinogen, Ur: 0.2 mg/dL (ref 0.2–1.0)
pH, UA: 5.5 (ref 5.0–7.5)

## 2020-07-15 ENCOUNTER — Other Ambulatory Visit: Payer: Self-pay

## 2020-07-15 ENCOUNTER — Ambulatory Visit
Admission: RE | Admit: 2020-07-15 | Discharge: 2020-07-15 | Disposition: A | Discharge status: Home / Self Care | Payer: PPO | Source: Ambulatory Visit | Attending: Internal Medicine | Admitting: Internal Medicine

## 2020-07-15 DIAGNOSIS — Z1231 Encounter for screening mammogram for malignant neoplasm of breast: Secondary | ICD-10-CM

## 2020-07-18 DIAGNOSIS — D044 Carcinoma in situ of skin of scalp and neck: Secondary | ICD-10-CM | POA: Diagnosis not present

## 2020-07-18 DIAGNOSIS — C4442 Squamous cell carcinoma of skin of scalp and neck: Secondary | ICD-10-CM | POA: Diagnosis not present

## 2020-07-22 DIAGNOSIS — M8588 Other specified disorders of bone density and structure, other site: Secondary | ICD-10-CM | POA: Diagnosis not present

## 2020-07-30 DIAGNOSIS — M25552 Pain in left hip: Secondary | ICD-10-CM | POA: Diagnosis not present

## 2020-07-30 DIAGNOSIS — G5702 Lesion of sciatic nerve, left lower limb: Secondary | ICD-10-CM | POA: Diagnosis not present

## 2020-08-12 NOTE — Progress Notes (Signed)
07/10/2020 1:25 PM   Rafaelita Foister Paige [birth year] [id number]  Referring provider: Rusty Aus, MD [facility] [place],  [place] [address] Chief Complaint  Patient presents with  . Urinary Incontinence    HPI: Jocelyn Hooper is a 79 y.o. female who returns for a 1 year follow up of rUTI, history of kidney stones and urge incontinence who presents today after a trial of Myrbetriq.     KUB from 03/08/2020 showed no radio-opaque calculi or other significant radiographic abnormality are seen.  Multiple documented UA's over this past year with no signs of infection.   Bladder scan by PVR is 0 mL today.   She feels that the Myrbetriq 25 mg daily improved her urgency, leakage and accidents significantly.   Patient denies any modifying or aggravating factors.  Patient denies any gross hematuria, dysuria or suprapubic/flank pain.  Patient denies any fevers, chills, nausea or vomiting.   She reports chronic constipation. Patient is taking stool softeners.  She is still working on her constipation.    She does have a personal history of urinary tract infections.  She is asymptomatic at today's visit.    PMH: Past Medical History:  Diagnosis Date  . Bursitis of left shoulder   . Chest pain, unspecified   . Chronic kidney disease    KIDNEY STONE CURRENTLY-08-2015-SEES DR COPE  . Complication of anesthesia    PT HAS SEVERE SCOLIOSIS AND FOR TKR ANESTHESIA COULD NOT GET A SPINAL  . Coronary artery disease    ascvd  . CSF leak   . DDD (degenerative disc disease), cervical   . Diverticulosis   . Esophageal reflux   . Fibrocystic breast disease   . GERD (gastroesophageal reflux disease)   . Headache    H/O  . History of colon polyps   . History of hiatal hernia   . History of kidney stones   . IBS (irritable bowel syndrome)   . Nasal polyps   . Other and unspecified hyperlipidemia   . Scoliosis   . Tinnitus of both ears   . Trigeminal  neuralgia syndrome   . Unspecified sinusitis (chronic)   . Vitamin B12 deficiency     Surgical History: Past Surgical History:  Procedure Laterality Date  . ABDOMINAL HYSTERECTOMY    . APPENDECTOMY    . BACK SURGERY  1982   disc  . [place]     left   . CATARACT EXTRACTION     right  . CHOLECYSTECTOMY  2002  . COLONOSCOPY WITH PROPOFOL N/A 03/10/2017   Procedure: COLONOSCOPY WITH PROPOFOL;  Surgeon: Manya Silvas, MD;  Location: [facility] ENDOSCOPY;  Service: Endoscopy;  Laterality: N/A;  . EYE SURGERY Bilateral    Cataract Extraction with IOL  . hysterectomy (other)  1974  . JOINT REPLACEMENT     LEFT knee  . KIDNEY STONE SURGERY    . KNEE ARTHROPLASTY Right 09/27/2017   Procedure: COMPUTER ASSISTED TOTAL KNEE ARTHROPLASTY;  Surgeon: Dereck Leep, MD;  Location: [facility] ORS;  Service: Orthopedics;  Laterality: Right;  . KNEE ARTHROSCOPY Right 09/13/2015   Procedure: ARTHROSCOPY KNEE, PARTIAL LATERAL MENISECTOMY, CHONDROPLASTY ;  Surgeon: Dereck Leep, MD;  Location: [facility] ORS;  Service: Orthopedics;  Laterality: Right;  . KNEE SURGERY  2004   having a lot of problems with left knee  . [place], 2003, 2012  .  sinus procedures  1970,1990,1994,2005  . TONSILLECTOMY      Home Medications:  Allergies as of 08/13/2020      Reactions   Avelox [moxifloxacin Hcl In Nacl] Other (See Comments)   Redness all over in about 5 minutes after taking   Codeine Nausea Only   REACTION: nausea   Acyclovir And Related    Shrimp [shellfish Allergy]    Hives    Ciprofloxacin Rash   Moxifloxacin Hives, Rash   Other reaction(s): OTHER      Medication List       Accurate as of August 13, 2020  1:25 PM. If you have any questions, ask your nurse or doctor.        STOP taking these medications   furosemide 20 MG tablet Commonly known as: LASIX Stopped by: Zara Council, PA-C     TAKE these  medications   aspirin EC 81 MG tablet Take 81 mg by mouth daily.   Caltrate 600+D 600-400 MG-UNIT tablet Generic drug: Calcium Carbonate-Vitamin D Take 1 tablet by mouth 2 (two) times daily.   Dexilant 30 MG capsule Generic drug: Dexlansoprazole Take 1 capsule by mouth daily.   estradiol 0.5 MG tablet Commonly known as: ESTRACE Take 0.5 mg by mouth daily.   fluticasone 50 MCG/ACT nasal spray Commonly known as: FLONASE Place 2 sprays into both nostrils daily.   gabapentin 300 MG capsule Commonly known as: NEURONTIN Take 300 mg by mouth at bedtime.   STOOL SOFTENER PO Take 1 tablet by mouth daily.   tamsulosin 0.4 MG Caps capsule Commonly known as: FLOMAX Take by mouth.   topiramate 50 MG tablet Commonly known as: TOPAMAX Take by mouth.   vitamin C 500 MG tablet Commonly known as: ASCORBIC ACID Take 1,000 mg by mouth 2 (two) times daily.       Allergies:  Allergies  Allergen Reactions  . Avelox [Moxifloxacin Hcl In Nacl] Other (See Comments)    Redness all over in about 5 minutes after taking  . Codeine Nausea Only    REACTION: nausea  . Acyclovir And Related   . Shrimp [Shellfish Allergy]     Hives    . Ciprofloxacin Rash  . Moxifloxacin Hives and Rash    Other reaction(s): OTHER    Family History: Family History  Problem Relation Age of Onset  . Heart attack Mother 54  . Heart failure Mother   . Heart attack Father   . Heart disease Father        CABG  . Heart attack Brother 65       premature  . Breast cancer Sister 83    Social History:  reports that she has never smoked. She has never used smokeless tobacco. She reports that she does not drink alcohol and does not use drugs.   Physical Exam: BP 118/75   Pulse 99   Ht 5\' 5"  (1.651 m)   Wt 144 lb (65.3 kg)   BMI 23.96 kg/m   Constitutional:  Well nourished. Alert and oriented, No acute distress. HEENT: [place] AT, mask in place.  Trachea midline Cardiovascular: No clubbing, cyanosis, or  edema. Respiratory: Normal respiratory effort, no increased work of breathing. Neurologic: Grossly intact, no focal deficits, moving all 4 extremities. Psychiatric: Normal mood and affect.   Laboratory Data: Urinalysis Negative- see epic I have reviewed the labs.  Pertinent Imaging: Results for KYLENE, ZAMARRON (MRN [id number]) as of 08/13/2020 13:33  Ref. Range 08/13/2020 13:22  Scan Result Unknown 0  Assessment & Plan:    1. Urgency/Urge incontinence Multiple documented UA's over this past year with no signs of infection.  At goal with Myrbetriq 25 mg daily She will continue that medication I will send a prescription into her pharmacy She will follow-up in 3 months for a PVR and symptom recheck  2. Constipation  Discussed behavioral modifications.  Continue stool softeners   [facility] [address] [place], [place] [address] [phone]  Zara Council, PA-C

## 2020-08-13 ENCOUNTER — Encounter: Payer: Self-pay | Admitting: Urology

## 2020-08-13 ENCOUNTER — Ambulatory Visit: Payer: PPO | Admitting: Urology

## 2020-08-13 ENCOUNTER — Other Ambulatory Visit: Payer: Self-pay

## 2020-08-13 VITALS — BP 118/75 | HR 99 | Ht 65.0 in | Wt 144.0 lb

## 2020-08-13 DIAGNOSIS — N3941 Urge incontinence: Secondary | ICD-10-CM | POA: Diagnosis not present

## 2020-08-13 DIAGNOSIS — K5909 Other constipation: Secondary | ICD-10-CM

## 2020-08-13 LAB — BLADDER SCAN AMB NON-IMAGING: Scan Result: 0

## 2020-08-13 MED ORDER — MIRABEGRON ER 25 MG PO TB24: 25.0000 mg | ORAL_TABLET | Freq: Every day | ORAL | 3 refills | Status: DC

## 2020-09-12 ENCOUNTER — Other Ambulatory Visit
Admission: RE | Admit: 2020-09-12 | Discharge: 2020-09-12 | Disposition: A | Discharge status: Home / Self Care | Payer: PPO | Attending: Ophthalmology | Admitting: Ophthalmology

## 2020-09-12 DIAGNOSIS — M316 Other giant cell arteritis: Secondary | ICD-10-CM | POA: Insufficient documentation

## 2020-09-12 LAB — SEDIMENTATION RATE: Sed Rate: 29 mm/hr — ABNORMAL HIGH (ref 0–22)

## 2020-09-12 LAB — C-REACTIVE PROTEIN: CRP: 0.6 mg/dL (ref <1.0)

## 2020-09-12 LAB — PLATELET COUNT: Platelets: 313 10*3/uL (ref 150–400)

## 2020-10-02 DIAGNOSIS — N760 Acute vaginitis: Secondary | ICD-10-CM | POA: Diagnosis not present

## 2020-10-02 DIAGNOSIS — L03115 Cellulitis of right lower limb: Secondary | ICD-10-CM | POA: Diagnosis not present

## 2020-10-17 DIAGNOSIS — G500 Trigeminal neuralgia: Secondary | ICD-10-CM | POA: Diagnosis not present

## 2020-10-17 DIAGNOSIS — G43019 Migraine without aura, intractable, without status migrainosus: Secondary | ICD-10-CM | POA: Diagnosis not present

## 2020-11-05 ENCOUNTER — Ambulatory Visit: Payer: PPO | Admitting: Nurse Practitioner

## 2020-11-05 NOTE — Progress Notes (Deleted)
Office Visit    Patient Name: Jocelyn Hooper Date of Encounter: 11/05/2020  Primary Care Provider:  Rusty Aus, MD Primary Cardiologist:  No primary care provider on file.  Chief Complaint    ***  Past Medical History    Past Medical History:  Diagnosis Date  . Bursitis of left shoulder   . Chest pain, unspecified   . Chronic kidney disease    KIDNEY STONE CURRENTLY-08-2015-SEES DR COPE  . Complication of anesthesia    PT HAS SEVERE SCOLIOSIS AND FOR TKR ANESTHESIA COULD NOT GET A SPINAL  . Coronary artery disease    ascvd  . CSF leak   . DDD (degenerative disc disease), cervical   . Diverticulosis   . Esophageal reflux   . Fibrocystic breast disease   . GERD (gastroesophageal reflux disease)   . Headache    H/O  . History of colon polyps   . History of hiatal hernia   . History of kidney stones   . IBS (irritable bowel syndrome)   . Nasal polyps   . Other and unspecified hyperlipidemia   . Scoliosis   . Tinnitus of both ears   . Trigeminal neuralgia syndrome   . Unspecified sinusitis (chronic)   . Vitamin B12 deficiency    Past Surgical History:  Procedure Laterality Date  . ABDOMINAL HYSTERECTOMY    . APPENDECTOMY    . BACK SURGERY  1982   disc  . [place]     left   . CATARACT EXTRACTION     right  . CHOLECYSTECTOMY  2002  . COLONOSCOPY WITH PROPOFOL N/A 03/10/2017   Procedure: COLONOSCOPY WITH PROPOFOL;  Surgeon: Manya Silvas, MD;  Location: [facility] ENDOSCOPY;  Service: Endoscopy;  Laterality: N/A;  . EYE SURGERY Bilateral    Cataract Extraction with IOL  . hysterectomy (other)  1974  . JOINT REPLACEMENT     LEFT knee  . KIDNEY STONE SURGERY    . KNEE ARTHROPLASTY Right 09/27/2017   Procedure: COMPUTER ASSISTED TOTAL KNEE ARTHROPLASTY;  Surgeon: Dereck Leep, MD;  Location: [facility] ORS;  Service: Orthopedics;  Laterality: Right;  . KNEE ARTHROSCOPY Right 09/13/2015   Procedure:  ARTHROSCOPY KNEE, PARTIAL LATERAL MENISECTOMY, CHONDROPLASTY ;  Surgeon: Dereck Leep, MD;  Location: [facility] ORS;  Service: Orthopedics;  Laterality: Right;  . KNEE SURGERY  2004   having a lot of problems with left knee  . [place], 2003, 2012  . sinus procedures  1970,1990,1994,2005  . TONSILLECTOMY      Allergies  Allergies  Allergen Reactions  . Avelox [Moxifloxacin Hcl In Nacl] Other (See Comments)    Redness all over in about 5 minutes after taking  . Codeine Nausea Only    REACTION: nausea  . Acyclovir And Related   . Shrimp [Shellfish Allergy]     Hives    . Ciprofloxacin Rash  . Moxifloxacin Hives and Rash    Other reaction(s): OTHER    History of Present Illness    ***  Home Medications    Prior to Admission medications   Medication Sig Start Date End Date Taking? Authorizing Provider  aspirin EC 81 MG tablet Take 81 mg by mouth daily.    [provider]  Calcium Carbonate-Vitamin D (CALTRATE 600+D) 600-400 MG-UNIT per tablet Take 1 tablet by mouth 2 (two) times daily.      [provider]  DEXILANT 30 MG capsule Take 1 capsule by mouth daily. 06/24/20   [provider]  Docusate Calcium (STOOL SOFTENER PO) Take 1 tablet by mouth daily.     [provider]  estradiol (ESTRACE) 0.5 MG tablet Take 0.5 mg by mouth daily.    [provider]  fluticasone (FLONASE) 50 MCG/ACT nasal spray Place 2 sprays into both nostrils daily.  01/09/14   [provider]  gabapentin (NEURONTIN) 300 MG capsule Take 300 mg by mouth at bedtime.     [provider]  mirabegron ER (MYRBETRIQ) 25 MG TB24 tablet Take 1 tablet (25 mg total) by mouth daily. 08/13/20   Zara Council A, PA-C  tamsulosin (FLOMAX) 0.4 MG CAPS capsule Take by mouth. 08/09/13   [provider]  topiramate (TOPAMAX) 50 MG tablet Take by mouth. 07/26/19 07/20/20  [provider]  vitamin C (ASCORBIC ACID) 500  MG tablet Take 1,000 mg by mouth 2 (two) times daily.     [provider]    Review of Systems    ***.  All other systems reviewed and are otherwise negative except as noted above.  Physical Exam    VS:  There were no vitals taken for this visit. , BMI There is no height or weight on file to calculate BMI. GEN: Well nourished, well developed, in no acute distress. HEENT: normal. Neck: Supple, no JVD, carotid bruits, or masses. Cardiac: RRR, no murmurs, rubs, or gallops. No clubbing, cyanosis, edema.  Radials/DP/PT 2+ and equal bilaterally.  Respiratory:  Respirations regular and unlabored, clear to auscultation bilaterally. GI: Soft, nontender, nondistended, BS + x 4. MS: no deformity or atrophy. Skin: warm and dry, no rash. Neuro:  Strength and sensation are intact. Psych: Normal affect.  Accessory Clinical Findings    ECG personally reviewed by me today - *** - no acute changes.  Lab Results  Component Value Date   WBC 6.5 09/16/2017   HGB 14.1 09/16/2017   HCT 42.2 09/16/2017   MCV 95.2 09/16/2017   PLT 313 09/12/2020   Lab Results  Component Value Date   CREATININE 0.99 09/16/2017   BUN 19 09/16/2017   NA 141 09/16/2017   K 3.9 09/16/2017   CL 110 09/16/2017   CO2 23 09/16/2017   Lab Results  Component Value Date   ALT 31 09/16/2017   AST 30 09/16/2017   ALKPHOS 75 09/16/2017   BILITOT 0.3 09/16/2017   Lab Results  Component Value Date   CHOL 198 10/07/2016   HDL 75 10/07/2016   LDLCALC 105 (H) 10/07/2016   TRIG 88 10/07/2016   CHOLHDL 2.6 10/07/2016    Lab Results  Component Value Date   HGBA1C 5.1 10/07/2016    Assessment & Plan    1.  ***   Murray Hodgkins, NP 11/05/2020, 7:38 AM

## 2020-11-11 NOTE — Progress Notes (Signed)
11/12/2020 2:52 PM   [place] 1941-09-05 [id number]  Referring provider: Rusty Aus, MD [facility] [place],  [place] [address]  Chief Complaint  Patient presents with  . Urinary Incontinence   Urological history: 1. Urge incontinence - managed with Myrbetriq 25 mg daily - PVR 12 mL  2. Nephrolithiasis - last stone incident in 2014   3. rUTI's - last documented UTI 2019 - risk factors of age, post menopausal, constipation and incontinence   HPI: Jocelyn Hooper is a 80 y.o. female who presents today for a three month follow.  She having to void 1-7 times during the day, 1-2 during the night, mild urgency, no incontinence and some toilet mapping.    She is finding the Myrbetriq 25 mg daily effective in reaching her goal concerning her urinary issues.  She states it cost her $45 for 30-day supply and is wondering if there is a generic for the medication.  Patient denies any modifying or aggravating factors.  Patient denies any gross hematuria, dysuria or suprapubic/flank pain.  Patient denies any fevers, chills, nausea or vomiting.    PMH: Past Medical History:  Diagnosis Date  . Bursitis of left shoulder   . Chest pain, unspecified   . Chronic kidney disease    KIDNEY STONE CURRENTLY-08-2015-SEES DR COPE  . Complication of anesthesia    PT HAS SEVERE SCOLIOSIS AND FOR TKR ANESTHESIA COULD NOT GET A SPINAL  . Coronary artery disease    ascvd  . CSF leak   . DDD (degenerative disc disease), cervical   . Diverticulosis   . Esophageal reflux   . Fibrocystic breast disease   . GERD (gastroesophageal reflux disease)   . Headache    H/O  . History of colon polyps   . History of hiatal hernia   . History of kidney stones   . IBS (irritable bowel syndrome)   . Nasal polyps   . Other and unspecified hyperlipidemia   . Scoliosis   . Tinnitus of both ears   . Trigeminal neuralgia syndrome   . Unspecified sinusitis  (chronic)   . Vitamin B12 deficiency     Surgical History: Past Surgical History:  Procedure Laterality Date  . ABDOMINAL HYSTERECTOMY    . APPENDECTOMY    . BACK SURGERY  1982   disc  . [place]     left   . CATARACT EXTRACTION     right  . CHOLECYSTECTOMY  2002  . COLONOSCOPY WITH PROPOFOL N/A 03/10/2017   Procedure: COLONOSCOPY WITH PROPOFOL;  Surgeon: Manya Silvas, MD;  Location[facility] ENDOSCOPY;  Service: Endoscopy;  Laterality: N/A;  . EYE SURGERY Bilateral    Cataract Extraction with IOL  . hysterectomy (other)  1974  . JOINT REPLACEMENT     LEFT knee  . KIDNEY STONE SURGERY    . KNEE ARTHROPLASTY Right 09/27/2017   Procedure: COMPUTER ASSISTED TOTAL KNEE ARTHROPLASTY;  Surgeon: Dereck Leep, MD;  Location: [facility] ORS;  Service: Orthopedics;  Laterality: Right;  . KNEE ARTHROSCOPY Right 09/13/2015   Procedure: ARTHROSCOPY KNEE, PARTIAL LATERAL MENISECTOMY, CHONDROPLASTY ;  Surgeon: Dereck Leep, MD;  Location: [facility] ORS;  Service: Orthopedics;  Laterality: Right;  . KNEE SURGERY  2004   having a lot of problems with left knee  . [place], 2003, 2012  . sinus procedures  1970,1990,1994,2005  . TONSILLECTOMY      Home Medications:  Allergies as of 11/12/2020      Reactions   Avelox [moxifloxacin Hcl In Nacl] Other (See Comments)   Redness all over in about 5 minutes after taking   Codeine Nausea Only   REACTION: nausea   Acyclovir And Related    Shrimp [shellfish Allergy]    Hives    Ciprofloxacin Rash   Moxifloxacin Hives, Rash   Other reaction(s): OTHER      Medication List       Accurate as of November 12, 2020  2:52 PM. If you have any questions, ask your nurse or doctor.        aspirin EC 81 MG tablet Take 81 mg by mouth daily.   Calcium Carbonate-Vitamin D 600-400 MG-UNIT tablet Take 1 tablet by mouth 2 (two) times daily.   Dexilant 30 MG capsule Generic  drug: Dexlansoprazole Take 1 capsule by mouth daily.   estradiol 0.5 MG tablet Commonly known as: ESTRACE Take 0.5 mg by mouth daily.   fluticasone 50 MCG/ACT nasal spray Commonly known as: FLONASE Place 2 sprays into both nostrils daily.   gabapentin 300 MG capsule Commonly known as: NEURONTIN Take 300 mg by mouth at bedtime.   mirabegron ER 25 MG Tb24 tablet Commonly known as: MYRBETRIQ Take 1 tablet (25 mg total) by mouth daily.   STOOL SOFTENER PO Take 1 tablet by mouth daily.   tamsulosin 0.4 MG Caps capsule Commonly known as: FLOMAX Take by mouth.   topiramate 50 MG tablet Commonly known as: TOPAMAX Take by mouth.   vitamin C 500 MG tablet Commonly known as: ASCORBIC ACID Take 1,000 mg by mouth 2 (two) times daily.       Allergies:  Allergies  Allergen Reactions  . Avelox [Moxifloxacin Hcl In Nacl] Other (See Comments)    Redness all over in about 5 minutes after taking  . Codeine Nausea Only    REACTION: nausea  . Acyclovir And Related   . Shrimp [Shellfish Allergy]     Hives    . Ciprofloxacin Rash  . Moxifloxacin Hives and Rash    Other reaction(s): OTHER    Family History: Family History  Problem Relation Age of Onset  . Heart attack Mother 62  . Heart failure Mother   . Heart attack Father   . Heart disease Father        CABG  . Heart attack Brother 65       premature  . Breast cancer Sister 82    Social History:  reports that she has never smoked. She has never used smokeless tobacco. She reports that she does not drink alcohol and does not use drugs.  ROS: Pertinent ROS in HPI  Physical Exam: BP 109/74   Pulse (!) 109   Ht 5\' 5"  (1.651 m)   Wt 140 lb (63.5 kg)   BMI 23.30 kg/m   Constitutional:  Well nourished. Alert and oriented, No acute distress. HEENT: [place] AT, mask in place.  Trachea midline Cardiovascular: No clubbing, cyanosis, or edema. Respiratory: Normal respiratory effort, no increased work of  breathing. Neurologic: Grossly intact, no focal deficits, moving all 4 extremities. Psychiatric: Normal mood and affect.   Laboratory Data: No new labs since last visit    Pertinent Imaging: Results for IVYANA, LOCEY (MRN [id number]) as of 11/12/2020 14:31  Ref. Range 11/12/2020 14:30  Scan Result Unknown 12    Assessment & Plan:  1. Urge incontinence -Explained to the patient that there is not a generic alternative for Myrbetriq at this time -Explained that there are generics available in the anticholinergic family for OAB, but they come with the side effects of dry eyes, dry mouth, constipation and cognitive defects and there also not recommended for individuals over the age of 42 -Offered PTNS therapy to the patient, she deferred -She stated she could get the Myrbetriq for 100 something for 90 days to her mail order pharmacy and she would stick with the Myrbetriq at this time as it is working for her -I gave her # 28 of samples of Myrbetriq 25 mg and information regarding the Extra Help program to see if she could qualify for that program    Return if symptoms worsen or fail to improve.  These notes generated with voice recognition software. I apologize for typographical errors.  Zara Council, PA-C  [facility] [address]  [place], [place] [address] [phone]

## 2020-11-12 ENCOUNTER — Encounter: Payer: Self-pay | Admitting: Urology

## 2020-11-12 ENCOUNTER — Ambulatory Visit: Payer: PPO | Admitting: Urology

## 2020-11-12 ENCOUNTER — Other Ambulatory Visit: Payer: Self-pay

## 2020-11-12 VITALS — BP 109/74 | HR 109 | Ht 65.0 in | Wt 140.0 lb

## 2020-11-12 DIAGNOSIS — N3941 Urge incontinence: Secondary | ICD-10-CM | POA: Diagnosis not present

## 2020-11-12 LAB — BLADDER SCAN AMB NON-IMAGING: Scan Result: 12

## 2020-11-13 DIAGNOSIS — K2100 Gastro-esophageal reflux disease with esophagitis, without bleeding: Secondary | ICD-10-CM | POA: Diagnosis not present

## 2020-11-13 DIAGNOSIS — J0100 Acute maxillary sinusitis, unspecified: Secondary | ICD-10-CM | POA: Diagnosis not present

## 2020-11-13 DIAGNOSIS — I5041 Acute combined systolic (congestive) and diastolic (congestive) heart failure: Secondary | ICD-10-CM | POA: Diagnosis not present

## 2020-11-13 DIAGNOSIS — I38 Endocarditis, valve unspecified: Secondary | ICD-10-CM | POA: Diagnosis not present

## 2020-11-20 DIAGNOSIS — R194 Change in bowel habit: Secondary | ICD-10-CM | POA: Diagnosis not present

## 2020-11-20 DIAGNOSIS — R634 Abnormal weight loss: Secondary | ICD-10-CM | POA: Diagnosis not present

## 2020-11-20 DIAGNOSIS — R110 Nausea: Secondary | ICD-10-CM | POA: Diagnosis not present

## 2020-12-24 DIAGNOSIS — M7582 Other shoulder lesions, left shoulder: Secondary | ICD-10-CM | POA: Diagnosis not present

## 2021-01-01 DIAGNOSIS — E538 Deficiency of other specified B group vitamins: Secondary | ICD-10-CM | POA: Diagnosis not present

## 2021-01-01 DIAGNOSIS — E782 Mixed hyperlipidemia: Secondary | ICD-10-CM | POA: Diagnosis not present

## 2021-01-02 ENCOUNTER — Ambulatory Visit: Payer: PPO | Admitting: Urology

## 2021-01-02 ENCOUNTER — Other Ambulatory Visit: Payer: Self-pay

## 2021-01-02 ENCOUNTER — Other Ambulatory Visit: Payer: Self-pay | Admitting: *Deleted

## 2021-01-02 ENCOUNTER — Ambulatory Visit
Admission: RE | Admit: 2021-01-02 | Discharge: 2021-01-02 | Disposition: A | Discharge status: Home / Self Care | Payer: PPO | Source: Ambulatory Visit | Attending: Urology | Admitting: Urology

## 2021-01-02 ENCOUNTER — Encounter: Payer: Self-pay | Admitting: Urology

## 2021-01-02 VITALS — BP 139/81 | HR 98 | Ht 65.0 in | Wt 140.0 lb

## 2021-01-02 DIAGNOSIS — R310 Gross hematuria: Secondary | ICD-10-CM

## 2021-01-02 DIAGNOSIS — R109 Unspecified abdominal pain: Secondary | ICD-10-CM

## 2021-01-02 DIAGNOSIS — Z87442 Personal history of urinary calculi: Secondary | ICD-10-CM

## 2021-01-02 DIAGNOSIS — N201 Calculus of ureter: Secondary | ICD-10-CM | POA: Diagnosis not present

## 2021-01-02 MED ORDER — OXYCODONE-ACETAMINOPHEN 5-325 MG PO TABS: 1.0000 | ORAL_TABLET | Freq: Four times a day (QID) | ORAL | 0 refills | Status: DC | PRN

## 2021-01-02 MED ORDER — SULFAMETHOXAZOLE-TRIMETHOPRIM 800-160 MG PO TABS: 1.0000 | ORAL_TABLET | Freq: Two times a day (BID) | ORAL | 0 refills | Status: DC

## 2021-01-02 MED ORDER — TAMSULOSIN HCL 0.4 MG PO CAPS: 0.4000 mg | ORAL_CAPSULE | Freq: Every day | ORAL | 0 refills | Status: DC

## 2021-01-02 NOTE — Patient Instructions (Signed)
Stop your aspirin so we can plan on doing ESWL on Thursday next week.

## 2021-01-02 NOTE — Progress Notes (Signed)
01/02/2021 3:02 PM   Jocelyn Hooper 1941/02/01 [id number]  Referring provider: Rusty Aus, MD [facility] [place],  [place] [address]  Chief Complaint  Patient presents with  . Nephrolithiasis   Urological history: 1. Urge incontinence - managed with Myrbetriq 25 mg daily - PVR 12 mL  2. Nephrolithiasis - last stone incident in 2014   3. rUTI's - last documented UTI 2019 - risk factors of age, post menopausal, constipation and incontinence   HPI: Jocelyn Hooper is a 80 y.o. female who presents today for possible stone.  She woke up this morning with sudden onset of right-sided flank pain that radiates to the right groin.  It has been constant since this morning.  She states it is 10 out of 10 pain this a.m., but she had an old prescription of oxycodone available to her so she took a tablet and now her pain is 7 out of 10.  She is also been experiencing some nausea but she has Zofran for GI issues and that has been controlling the nausea.  She denies any fever, chills or vomiting.  She also denies any gross hematuria or other urinary complaints.  She states she does have constipation but this is baseline.  She has not had any passage of stone fragments.  UA > 30 RBC's and calcium oxalate crystals.    KUB a 5 mm radiopaque calcification is noted below the right SI joint which may be a ureteral stone in the cause of her current symptoms.  She also has a small calcification in the right kidney and several calcifications within the left kidney.  She also has severe scoliosis.  PMH: Past Medical History:  Diagnosis Date  . Bursitis of left shoulder   . Chest pain, unspecified   . Chronic kidney disease    KIDNEY STONE CURRENTLY-08-2015-SEES DR COPE  . Complication of anesthesia    PT HAS SEVERE SCOLIOSIS AND FOR TKR ANESTHESIA COULD NOT GET A SPINAL  . Coronary artery disease    ascvd  . CSF leak   . DDD (degenerative disc  disease), cervical   . Diverticulosis   . Esophageal reflux   . Fibrocystic breast disease   . GERD (gastroesophageal reflux disease)   . Headache    H/O  . History of colon polyps   . History of hiatal hernia   . History of kidney stones   . IBS (irritable bowel syndrome)   . Nasal polyps   . Other and unspecified hyperlipidemia   . Scoliosis   . Tinnitus of both ears   . Trigeminal neuralgia syndrome   . Unspecified sinusitis (chronic)   . Vitamin B12 deficiency     Surgical History: Past Surgical History:  Procedure Laterality Date  . ABDOMINAL HYSTERECTOMY    . APPENDECTOMY    . BACK SURGERY  1982   disc  . [place]     left   . CATARACT EXTRACTION     right  . CHOLECYSTECTOMY  2002  . COLONOSCOPY WITH PROPOFOL N/A 03/10/2017   Procedure: COLONOSCOPY WITH PROPOFOL;  Surgeon: Manya Silvas, MD;  Location: [facility] ENDOSCOPY;  Service: Endoscopy;  Laterality: N/A;  . EYE SURGERY Bilateral    Cataract Extraction with IOL  . hysterectomy (other)  1974  . JOINT REPLACEMENT     LEFT knee  . KIDNEY STONE SURGERY    .  KNEE ARTHROPLASTY Right 09/27/2017   Procedure: COMPUTER ASSISTED TOTAL KNEE ARTHROPLASTY;  Surgeon: Dereck Leep, MD;  Location: [facility] ORS;  Service: Orthopedics;  Laterality: Right;  . KNEE ARTHROSCOPY Right 09/13/2015   Procedure: ARTHROSCOPY KNEE, PARTIAL LATERAL MENISECTOMY, CHONDROPLASTY ;  Surgeon: Dereck Leep, MD;  Location: [facility] ORS;  Service: Orthopedics;  Laterality: Right;  . KNEE SURGERY  2004   having a lot of problems with left knee  . [place], 2003, 2012  . sinus procedures  1970,1990,1994,2005  . TONSILLECTOMY      Home Medications:  Allergies as of 01/02/2021      Reactions   Avelox [moxifloxacin Hcl In Nacl] Other (See Comments)   Redness all over in about 5 minutes after taking   Codeine Nausea Only   REACTION: nausea   Acyclovir And  Related    Shrimp [shellfish Allergy]    Hives    Ciprofloxacin Rash   Moxifloxacin Hives, Rash   Other reaction(s): OTHER   Sucralfate Rash   Itching       Medication List       Accurate as of January 02, 2021  3:02 PM. If you have any questions, ask your nurse or doctor.        STOP taking these medications   traMADol 50 MG tablet Commonly known as: ULTRAM Stopped by: Zara Council, PA-C     TAKE these medications   aspirin EC 81 MG tablet Take 81 mg by mouth daily.   Calcium Carbonate-Vitamin D 600-400 MG-UNIT tablet Take 1 tablet by mouth 2 (two) times daily.   Dexilant 30 MG capsule Generic drug: Dexlansoprazole Take 1 capsule by mouth daily.   estradiol 0.5 MG tablet Commonly known as: ESTRACE Take 0.5 mg by mouth daily.   fluticasone 50 MCG/ACT nasal spray Commonly known as: FLONASE Place 2 sprays into both nostrils daily.   gabapentin 300 MG capsule Commonly known as: NEURONTIN Take 300 mg by mouth at bedtime.   metoCLOPramide 5 MG tablet Commonly known as: REGLAN 1 tab in the morning, 1 tab at bedtime   mirabegron ER 25 MG Tb24 tablet Commonly known as: MYRBETRIQ Take 1 tablet (25 mg total) by mouth daily.   ondansetron 4 MG disintegrating tablet Commonly known as: ZOFRAN-ODT Take 4 mg by mouth every 8 (eight) hours as needed.   oxyCODONE-acetaminophen 5-325 MG tablet Commonly known as: PERCOCET/ROXICET Take 1 tablet by mouth every 6 (six) hours as needed for severe pain. Started by: Zara Council, PA-C   STOOL SOFTENER PO Take 1 tablet by mouth daily.   sucralfate 1 g tablet Commonly known as: CARAFATE Take by mouth.   sulfamethoxazole-trimethoprim 800-160 MG tablet Commonly known as: BACTRIM DS Take 1 tablet by mouth every 12 (twelve) hours. Started by: Zara Council, PA-C   tamsulosin 0.4 MG Caps capsule Commonly known as: FLOMAX Take 1 capsule (0.4 mg total) by mouth daily. What changed:   how much to take  when to  take this Changed by: Magon Croson, PA-C   topiramate 50 MG tablet Commonly known as: TOPAMAX Take by mouth.   vitamin C 500 MG tablet Commonly known as: ASCORBIC ACID Take 1,000 mg by mouth 2 (two) times daily.       Allergies:  Allergies  Allergen Reactions  . Avelox [Moxifloxacin Hcl In Nacl] Other (See Comments)    Redness all over in about 5 minutes after taking  . Codeine Nausea Only    REACTION:  nausea  . Acyclovir And Related   . Shrimp [Shellfish Allergy]     Hives    . Ciprofloxacin Rash  . Moxifloxacin Hives and Rash    Other reaction(s): OTHER  . Sucralfate Rash    Itching     Family History: Family History  Problem Relation Age of Onset  . Heart attack Mother 64  . Heart failure Mother   . Heart attack Father   . Heart disease Father        CABG  . Heart attack Brother 65       premature  . Breast cancer Sister 34    Social History:  reports that she has never smoked. She has never used smokeless tobacco. She reports that she does not drink alcohol and does not use drugs.  ROS: Pertinent ROS in HPI  Physical Exam: BP 139/81   Pulse 98   Ht '5\' 5"'  (1.651 m)   Wt 140 lb (63.5 kg)   BMI 23.30 kg/m   Constitutional:  Well nourished. Alert and oriented, No acute distress. HEENT: [place] AT, mask in place.  Trachea midline Cardiovascular: No clubbing, cyanosis, or edema. Respiratory: Normal respiratory effort, no increased work of breathing. Neurologic: Grossly intact, no focal deficits, moving all 4 extremities. Psychiatric: Normal mood and affect.   Laboratory Data: Component     Latest Ref Rng & Units 01/02/2021  Specific Gravity, UA     1.005 - 1.030 1.025  pH, UA     5.0 - 7.5 6.0  Color, UA     Yellow Brown (A)  Appearance Ur     Clear Cloudy (A)  Leukocytes,UA     Negative Negative  Protein,UA     Negative/Trace 2+ (A)  Glucose, UA     Negative Negative  Ketones, UA     Negative Trace (A)  RBC, UA     Negative 3+ (A)   Bilirubin, UA     Negative Negative  Urobilinogen, Ur     0.2 - 1.0 mg/dL 4.0 (H)  Nitrite, UA     Negative Negative  Microscopic Examination      See below:   Component     Latest Ref Rng & Units 01/02/2021  WBC, UA     0 - 5 /hpf 0-5  RBC     0 - 2 /hpf >30 (A)  Epithelial Cells (non renal)     0 - 10 /hpf 0-10  Crystals     N/A Present (A)  Crystal Type     N/A Calcium Oxalate  Bacteria, UA     None seen/Few Few  I have reviewed the labs.   Pertinent Imaging: CLINICAL DATA:  Right-sided flank pain with history kidney stones.  EXAM: ABDOMEN - 1 VIEW  COMPARISON:  03/09/2019  FINDINGS: Two supine views. Moderate convex left lumbar spine curvature. Cholecystectomy clips. Moderate amount of colonic stool. 4 mm calcification projecting over the right interpolar kidney.  Left abdominal calcifications of up to 1.0 cm.  5 mm upper right pelvic calcification.  IMPRESSION: Bilateral abdominal calcifications which may represent renal calculi or be within the bowel.  An upper right pelvic calcification for which ureteric stone cannot be excluded.   Electronically Signed   By: Abigail Miyamoto M.D.   On: 01/03/2021 10:58   I have independently reviewed the films.  See HPI.    Assessment & Plan:    1. Right ureteral stone -We discussed various treatment options for urolithiasis including observation with  or without medical expulsive therapy, shockwave lithotripsy (SWL), ureteroscopy and laser lithotripsy with stent placement. -We discussed that management is based on stone size, location, density, patient co-morbidities, and patient preference.  -We discussed stones <85m in size have a >80% spontaneous passage rate. Data surrounding the use of tamsulosin for medical expulsive therapy is controversial, but meta analyses suggests it is most efficacious for distal stones between 5-181min size. Possible side effects include dizziness/lightheadedness -We  discussed SWL has a lower stone free rate in a single procedure, but also a lower complication rate compared to ureteroscopy and avoids a stent and associated stent related symptoms. Possible complications include renal hematoma, steinstrasse, and need for additional treatment. -We discussed ureteroscopy with laser lithotripsy and stent placement has a higher stone free rate than SWL in a single procedure, however increased complication rate including possible infection, ureteral injury, bleeding, and stent related morbidity. Common stent related symptoms include dysuria, urgency/frequency, and flank pain. -After an extensive discussion of the risks and benefits of the above treatment options, she wanted to pursue medical expulsive therapy -I reviewed red flag signs such as pain that is not controlled with pain medication, fevers of 101 or higher, intense chills or rigors and/or vomiting and that the symptoms needed to be addressed immediately by seeking care in the emergency department -Prescribed Percocet 01/15/2024, #10, 1 tablet every 6 hours as needed for pain after review of PDMP -Prescribed tamsulosin 0.4 mg daily -Given strainer to strain urine -instructed to stop ASA  -UA -Urine culture -Prescribed Septra DS, BID x 7 days - will adjust if necessary once urine culture results are available -CT renal stone study ordered to further evaluation and to plan for definitive therapy if MET fails    Return for CT report .  These notes generated with voice recognition software. I apologize for typographical errors.  SHZara CouncilPA-C  [facility] [address] [phone]

## 2021-01-03 LAB — URINALYSIS, COMPLETE
Bilirubin, UA: NEGATIVE
Glucose, UA: NEGATIVE
Leukocytes,UA: NEGATIVE
Nitrite, UA: NEGATIVE
Specific Gravity, UA: 1.025 (ref 1.005–1.030)
Urobilinogen, Ur: 4 mg/dL — ABNORMAL HIGH (ref 0.2–1.0)
pH, UA: 6 (ref 5.0–7.5)

## 2021-01-03 LAB — MICROSCOPIC EXAMINATION: RBC, Urine: >30 /hpf — AB (ref 0–2)

## 2021-01-05 LAB — CULTURE, URINE COMPREHENSIVE

## 2021-01-06 ENCOUNTER — Ambulatory Visit
Admission: RE | Admit: 2021-01-06 | Discharge: 2021-01-06 | Disposition: A | Discharge status: Home / Self Care | Payer: PPO | Source: Ambulatory Visit | Attending: Urology | Admitting: Urology

## 2021-01-06 ENCOUNTER — Other Ambulatory Visit: Payer: Self-pay

## 2021-01-06 DIAGNOSIS — N201 Calculus of ureter: Secondary | ICD-10-CM

## 2021-01-06 DIAGNOSIS — R109 Unspecified abdominal pain: Secondary | ICD-10-CM | POA: Insufficient documentation

## 2021-01-06 DIAGNOSIS — R319 Hematuria, unspecified: Secondary | ICD-10-CM | POA: Diagnosis not present

## 2021-01-06 DIAGNOSIS — R310 Gross hematuria: Secondary | ICD-10-CM

## 2021-01-06 DIAGNOSIS — N2889 Other specified disorders of kidney and ureter: Secondary | ICD-10-CM | POA: Diagnosis not present

## 2021-01-06 DIAGNOSIS — R10A0 Flank pain, unspecified side: Secondary | ICD-10-CM

## 2021-01-06 DIAGNOSIS — N200 Calculus of kidney: Secondary | ICD-10-CM | POA: Diagnosis not present

## 2021-01-06 DIAGNOSIS — K449 Diaphragmatic hernia without obstruction or gangrene: Secondary | ICD-10-CM | POA: Diagnosis not present

## 2021-01-06 NOTE — H&P (View-Only) (Signed)
01/07/2021 2:44 PM   Jocelyn Hooper 1941/06/28 [id number]  Referring provider: Danella Penton, MD [phone] [facility] [address] [facility] [place],  [place] [address]  Chief Complaint  Patient presents with  . Nephrolithiasis   Urological history: 1. Urge incontinence - managed with Myrbetriq 25 mg daily - PVR 12 mL  2. Nephrolithiasis - last stone incident in 2014   3. rUTI's - last documented UTI 2019 - risk factors of age, post menopausal, constipation and incontinence   HPI: Jocelyn Hooper is a 80 y.o. female who presents today for CT scan report.  CT renal stone study on 01/06/2021 notes a distal right calcification, 6 mm in size, density < 1500 with skin to stone distance < 15 cm without hydronephrosis.  It also noted a right 3 mm renal stone.    She woke up this morning with sudden onset of right-sided flank pain that radiates to the right groin.  It has been constant since this morning.  She states it is 10 out of 10 pain this a.m., but she had an old prescription of oxycodone available to her so she took a tablet and now her pain is 7 out of 10.  She is also been experiencing some nausea but she has Zofran for GI issues and that has been controlling the nausea.  She denies any fever, chills or vomiting.  She also denies any gross hematuria or other urinary complaints.  She states she does have constipation but this is baseline.  She has not had any passage of stone fragments.  UA > 30 RBC's and calcium oxalate crystals.    KUB a 5 mm radiopaque calcification is noted below the right SI joint which may be a ureteral stone in the cause of her current symptoms.  She also has a small calcification in the right kidney and several calcifications within the left kidney.  She also has severe scoliosis.  PMH: Past Medical History:  Diagnosis Date  . Bursitis of left shoulder   . Chest pain, unspecified   . Chronic kidney disease    KIDNEY STONE  CURRENTLY-08-2015-SEES DR COPE  . Complication of anesthesia    PT HAS SEVERE SCOLIOSIS AND FOR TKR ANESTHESIA COULD NOT GET A SPINAL  . Coronary artery disease    ascvd  . CSF leak   . DDD (degenerative disc disease), cervical   . Diverticulosis   . Esophageal reflux   . Fibrocystic breast disease   . GERD (gastroesophageal reflux disease)   . Headache    H/O  . History of colon polyps   . History of hiatal hernia   . History of kidney stones   . IBS (irritable bowel syndrome)   . Nasal polyps   . Other and unspecified hyperlipidemia   . Scoliosis   . Tinnitus of both ears   . Trigeminal neuralgia syndrome   . Unspecified sinusitis (chronic)   . Vitamin B12 deficiency     Surgical History: Past Surgical History:  Procedure Laterality Date  . ABDOMINAL HYSTERECTOMY    . APPENDECTOMY    . BACK SURGERY  1982   disc  . CARDIAC CATHETERIZATION     [facility]   . CATARACT EXTRACTION     left   . CATARACT EXTRACTION     right  . CHOLECYSTECTOMY  2002  . COLONOSCOPY WITH PROPOFOL N/A 03/10/2017   Procedure: COLONOSCOPY WITH PROPOFOL;  Surgeon: Scot Jun, MD;  Location: [facility];  Service: Endoscopy;  Laterality: N/A;  . EYE SURGERY Bilateral    Cataract Extraction with IOL  . hysterectomy (other)  1974  . JOINT REPLACEMENT     LEFT knee  . KIDNEY STONE SURGERY    . KNEE ARTHROPLASTY Right 09/27/2017   Procedure: COMPUTER ASSISTED TOTAL KNEE ARTHROPLASTY;  Surgeon: Dereck Leep, MD;  Location: [facility] ORS;  Service: Orthopedics;  Laterality: Right;  . KNEE ARTHROSCOPY Right 09/13/2015   Procedure: ARTHROSCOPY KNEE, PARTIAL LATERAL MENISECTOMY, CHONDROPLASTY ;  Surgeon: Dereck Leep, MD;  Location: [facility] ORS;  Service: Orthopedics;  Laterality: Right;  . KNEE SURGERY  2004   having a lot of problems with left knee  . Home, 2003, 2012  . sinus procedures  1970,1990,1994,2005  . TONSILLECTOMY      Home Medications:   Allergies as of 01/07/2021      Reactions   Avelox [moxifloxacin Hcl In Nacl] Other (See Comments)   Redness all over in about 5 minutes after taking   Codeine Nausea Only   REACTION: nausea   Acyclovir And Related    Shrimp [shellfish Allergy]    Hives    Ciprofloxacin Rash   Moxifloxacin Hives, Rash   Other reaction(s): OTHER   Sucralfate Rash   Itching       Medication List       Accurate as of January 07, 2021  2:44 PM. If you have any questions, ask your nurse or doctor.        aspirin EC 81 MG tablet Take 81 mg by mouth daily.   Calcium Carbonate-Vitamin D 600-400 MG-UNIT tablet Take 1 tablet by mouth 2 (two) times daily.   Dexilant 30 MG capsule Generic drug: Dexlansoprazole Take 1 capsule by mouth daily.   estradiol 0.5 MG tablet Commonly known as: ESTRACE Take 0.5 mg by mouth daily.   fluticasone 50 MCG/ACT nasal spray Commonly known as: FLONASE Place 2 sprays into both nostrils daily.   gabapentin 300 MG capsule Commonly known as: NEURONTIN Take 300 mg by mouth at bedtime.   metoCLOPramide 5 MG tablet Commonly known as: REGLAN 1 tab in the morning, 1 tab at bedtime   mirabegron ER 25 MG Tb24 tablet Commonly known as: MYRBETRIQ Take 1 tablet (25 mg total) by mouth daily.   ondansetron 4 MG disintegrating tablet Commonly known as: ZOFRAN-ODT Take 4 mg by mouth every 8 (eight) hours as needed.   oxyCODONE-acetaminophen 5-325 MG tablet Commonly known as: PERCOCET/ROXICET Take 1 tablet by mouth every 6 (six) hours as needed for severe pain.   STOOL SOFTENER PO Take 1 tablet by mouth daily.   sucralfate 1 g tablet Commonly known as: CARAFATE Take by mouth.   sulfamethoxazole-trimethoprim 800-160 MG tablet Commonly known as: BACTRIM DS Take 1 tablet by mouth every 12 (twelve) hours.   tamsulosin 0.4 MG Caps capsule Commonly known as: FLOMAX Take 1 capsule (0.4 mg total) by mouth daily.   topiramate 50 MG tablet Commonly known as:  TOPAMAX Take by mouth.   vitamin C 500 MG tablet Commonly known as: ASCORBIC ACID Take 1,000 mg by mouth 2 (two) times daily.       Allergies:  Allergies  Allergen Reactions  . Avelox [Moxifloxacin Hcl In Nacl] Other (See Comments)    Redness all over in about 5 minutes after taking  . Codeine Nausea Only    REACTION: nausea  . Acyclovir And Related   . Shrimp [Shellfish Allergy]  Hives    . Ciprofloxacin Rash  . Moxifloxacin Hives and Rash    Other reaction(s): OTHER  . Sucralfate Rash    Itching     Family History: Family History  Problem Relation Age of Onset  . Heart attack Mother 33  . Heart failure Mother   . Heart attack Father   . Heart disease Father        CABG  . Heart attack Brother 65       premature  . Breast cancer Sister 41    Social History:  reports that she has never smoked. She has never used smokeless tobacco. She reports that she does not drink alcohol and does not use drugs.  ROS: Pertinent ROS in HPI  Physical Exam: BP 103/68   Pulse (!) 102   Ht 5\' 5"  (1.651 m)   Wt 131 lb (59.4 kg)   BMI 21.80 kg/m   Constitutional:  Well nourished. Alert and oriented, No acute distress. HEENT: [place] AT, mask in place.  Trachea midline Cardiovascular: No clubbing, cyanosis, or edema. Respiratory: Normal respiratory effort, no increased work of breathing. Neurologic: Grossly intact, no focal deficits, moving all 4 extremities. Psychiatric: Normal mood and affect.   Laboratory Data: Component     Latest Ref Rng & Units 01/02/2021  Specific Gravity, UA     1.005 - 1.030 1.025  pH, UA     5.0 - 7.5 6.0  Color, UA     Yellow Brown (A)  Appearance Ur     Clear Cloudy (A)  Leukocytes,UA     Negative Negative  Protein,UA     Negative/Trace 2+ (A)  Glucose, UA     Negative Negative  Ketones, UA     Negative Trace (A)  RBC, UA     Negative 3+ (A)  Bilirubin, UA     Negative Negative  Urobilinogen, Ur     0.2 - 1.0 mg/dL 4.0 (H)   Nitrite, UA     Negative Negative  Microscopic Examination      See below:   Component     Latest Ref Rng & Units 01/02/2021  WBC, UA     0 - 5 /hpf 0-5  RBC     0 - 2 /hpf >30 (A)  Epithelial Cells (non renal)     0 - 10 /hpf 0-10  Crystals     N/A Present (A)  Crystal Type     N/A Calcium Oxalate  Bacteria, UA     None seen/Few Few  I have reviewed the labs.   Pertinent Imaging: CLINICAL DATA:  Right-sided flank pain with history kidney stones.  EXAM: ABDOMEN - 1 VIEW  COMPARISON:  03/09/2019  FINDINGS: Two supine views. Moderate convex left lumbar spine curvature. Cholecystectomy clips. Moderate amount of colonic stool. 4 mm calcification projecting over the right interpolar kidney.  Left abdominal calcifications of up to 1.0 cm.  5 mm upper right pelvic calcification.  IMPRESSION: Bilateral abdominal calcifications which may represent renal calculi or be within the bowel.  An upper right pelvic calcification for which ureteric stone cannot be excluded.   Electronically Signed   By: Abigail Miyamoto M.D.   On: 01/03/2021 10:58    CLINICAL DATA:  Right flank pain and hematuria. Rule out kidney stone.  EXAM: CT ABDOMEN AND PELVIS WITHOUT CONTRAST  TECHNIQUE: Multidetector CT imaging of the abdomen and pelvis was performed following the standard protocol without IV contrast.  COMPARISON:  08/22/2013  FINDINGS: Lower  chest: No acute abnormality. Subpleural interstitial reticulation is identified within both lung bases.  Hepatobiliary: No focal liver abnormality is seen. Status post cholecystectomy. No biliary dilatation.  Pancreas: Unremarkable. No pancreatic ductal dilatation or surrounding inflammatory changes.  Spleen: Normal in size without focal abnormality.  Adrenals/Urinary Tract: Normal adrenal glands.  Right kidney stone measures 3 mm, image 29/2. No left kidney stone. No mass or hydronephrosis identified  bilaterally. Calcification along the course of the right ureter measures 5 mm. Cannot rule out nonobstructing distal ureteral calculus. Left scratch set no left-sided hydronephrosis or hydroureter. No left-sided ureteral calculi. Urinary bladder appears normal.  Stomach/Bowel: Moderate hiatal hernia. Appendix not confidently identified. No evidence of bowel wall thickening, distention, or inflammatory changes.  Vascular/Lymphatic: Aortic atherosclerosis. No enlarged abdominal or pelvic lymph nodes.  Reproductive: Status post hysterectomy. No adnexal masses.  Other: Small fat containing umbilical hernia. No abdominopelvic ascites.  Musculoskeletal: Scoliosis and degenerative disc disease noted.  IMPRESSION: 1. Calcification along the course of the right ureter measures 5 mm. No hydronephrosis or hydroureter. Cannot rule out nonobstructing distal ureteral calculus. 2. Hiatal hernia. 3. Aortic atherosclerosis.  Aortic Atherosclerosis (ICD10-I70.0).   Electronically Signed   By: Kerby Moors M.D.   On: 01/06/2021 15:28  Component     Latest Ref Rng & Units 01/02/2021  Specific Gravity, UA     1.005 - 1.030 1.025  pH, UA     5.0 - 7.5 6.0  Color, UA     Yellow Brown (A)  Appearance Ur     Clear Cloudy (A)  Leukocytes,UA     Negative Negative  Protein,UA     Negative/Trace 2+ (A)  Glucose, UA     Negative Negative  Ketones, UA     Negative Trace (A)  RBC, UA     Negative 3+ (A)  Bilirubin, UA     Negative Negative  Urobilinogen, Ur     0.2 - 1.0 mg/dL 4.0 (H)  Nitrite, UA     Negative Negative  Microscopic Examination      See below:   Component     Latest Ref Rng & Units 01/02/2021  WBC, UA     0 - 5 /hpf 0-5  RBC     0 - 2 /hpf >30 (A)  Epithelial Cells (non renal)     0 - 10 /hpf 0-10  Crystals     N/A Present (A)  Crystal Type     N/A Calcium Oxalate  Bacteria, UA     None seen/Few Few   I have independently reviewed the films.  SD  < 1500 HU  SSD < 15 cm    Assessment & Plan:    1. Right ureteral stone -We discussed various treatment options for urolithiasis including observation with or without medical expulsive therapy, shockwave lithotripsy (SWL), ureteroscopy and laser lithotripsy with stent placement. -We discussed that management is based on stone size, location, density, patient co-morbidities, and patient preference.  -We discussed stones <19mm in size have a >80% spontaneous passage rate. Data surrounding the use of tamsulosin for medical expulsive therapy is controversial, but meta analyses suggests it is most efficacious for distal stones between 5-81mm in size. Possible side effects include dizziness/lightheadedness -We discussed SWL has a lower stone free rate in a single procedure, but also a lower complication rate compared to ureteroscopy and avoids a stent and associated stent related symptoms. Possible complications include renal hematoma, steinstrasse, and need for additional treatment. -We discussed ureteroscopy with laser  lithotripsy and stent placement has a higher stone free rate than SWL in a single procedure, however increased complication rate including possible infection, ureteral injury, bleeding, and stent related morbidity. Common stent related symptoms include dysuria, urgency/frequency, and flank pain. -After an extensive discussion of the risks and benefits of the above treatment options, she wanted to pursue medical expulsive therapy -stone has not passed -she would like to pursue right ESWL at this time -I reviewed red flag signs such as pain that is not controlled with pain medication, fevers of 101 or higher, intense chills or rigors and/or vomiting and that the symptoms needed to be addressed immediately by seeking care in the emergency department -Continue Percocet 01/15/2024, #10, 1 tablet every 6 hours as needed for pain  -Continue tamsulosin 0.4 mg daily -Continue to strain  urine -Continue to stop ASA  -UA -Urine culture negative   Return for right ESWL .  These notes generated with voice recognition software. I apologize for typographical errors.  Zara Council, PA-C  [facility] [address]  [address] [place], [place] [address] [phone]

## 2021-01-06 NOTE — Progress Notes (Signed)
  01/07/2021 2:44 PM   Melaine L Alcorta 12/11/1940 [id number]  Referring provider: Miller, Mark F, MD [address] [facility] [place],  [place] [address]  Chief Complaint  Patient presents with  . Nephrolithiasis   Urological history: 1. Urge incontinence - managed with Myrbetriq 25 mg daily - PVR 12 mL  2. Nephrolithiasis - last stone incident in 2014   3. rUTI's - last documented UTI 2019 - risk factors of age, post menopausal, constipation and incontinence   HPI: Jocelyn Hooper is a 80 y.o. female who presents today for CT scan report.  CT renal stone study on 01/06/2021 notes a distal right calcification, 6 mm in size, density < 1500 with skin to stone distance < 15 cm without hydronephrosis.  It also noted a right 3 mm renal stone.    She woke up this morning with sudden onset of right-sided flank pain that radiates to the right groin.  It has been constant since this morning.  She states it is 10 out of 10 pain this a.m., but she had an old prescription of oxycodone available to her so she took a tablet and now her pain is 7 out of 10.  She is also been experiencing some nausea but she has Zofran for GI issues and that has been controlling the nausea.  She denies any fever, chills or vomiting.  She also denies any gross hematuria or other urinary complaints.  She states she does have constipation but this is baseline.  She has not had any passage of stone fragments.  UA > 30 RBC's and calcium oxalate crystals.    KUB a 5 mm radiopaque calcification is noted below the right SI joint which may be a ureteral stone in the cause of her current symptoms.  She also has a small calcification in the right kidney and several calcifications within the left kidney.  She also has severe scoliosis.  PMH: Past Medical History:  Diagnosis Date  . Bursitis of left shoulder   . Chest pain, unspecified   . Chronic kidney disease    KIDNEY STONE  CURRENTLY-08-2015-SEES DR COPE  . Complication of anesthesia    PT HAS SEVERE SCOLIOSIS AND FOR TKR ANESTHESIA COULD NOT GET A SPINAL  . Coronary artery disease    ascvd  . CSF leak   . DDD (degenerative disc disease), cervical   . Diverticulosis   . Esophageal reflux   . Fibrocystic breast disease   . GERD (gastroesophageal reflux disease)   . Headache    H/O  . History of colon polyps   . History of hiatal hernia   . History of kidney stones   . IBS (irritable bowel syndrome)   . Nasal polyps   . Other and unspecified hyperlipidemia   . Scoliosis   . Tinnitus of both ears   . Trigeminal neuralgia syndrome   . Unspecified sinusitis (chronic)   . Vitamin B12 deficiency     Surgical History: Past Surgical History:  Procedure Laterality Date  . ABDOMINAL HYSTERECTOMY    . APPENDECTOMY    . BACK SURGERY  1982   disc  . CARDIAC CATHETERIZATION     [place]   . CATARACT EXTRACTION     left   . CATARACT EXTRACTION     right  . CHOLECYSTECTOMY  2002  . COLONOSCOPY WITH PROPOFOL N/A 03/10/2017   Procedure: COLONOSCOPY WITH PROPOFOL;  Surgeon: Elliott, Robert T, MD;  Location: [facility] ENDOSCOPY;    Service: Endoscopy;  Laterality: N/A;  . EYE SURGERY Bilateral    Cataract Extraction with IOL  . hysterectomy (other)  1974  . JOINT REPLACEMENT     LEFT knee  . KIDNEY STONE SURGERY    . KNEE ARTHROPLASTY Right 09/27/2017   Procedure: COMPUTER ASSISTED TOTAL KNEE ARTHROPLASTY;  Surgeon: Dereck Leep, MD;  Location: [facility] ORS;  Service: Orthopedics;  Laterality: Right;  . KNEE ARTHROSCOPY Right 09/13/2015   Procedure: ARTHROSCOPY KNEE, PARTIAL LATERAL MENISECTOMY, CHONDROPLASTY ;  Surgeon: Dereck Leep, MD;  Location: [facility] ORS;  Service: Orthopedics;  Laterality: Right;  . KNEE SURGERY  2004   having a lot of problems with left knee  . Home, 2003, 2012  . sinus procedures  1970,1990,1994,2005  . TONSILLECTOMY      Home Medications:   Allergies as of 01/07/2021      Reactions   Avelox [moxifloxacin Hcl In Nacl] Other (See Comments)   Redness all over in about 5 minutes after taking   Codeine Nausea Only   REACTION: nausea   Acyclovir And Related    Shrimp [shellfish Allergy]    Hives    Ciprofloxacin Rash   Moxifloxacin Hives, Rash   Other reaction(s): OTHER   Sucralfate Rash   Itching       Medication List       Accurate as of January 07, 2021  2:44 PM. If you have any questions, ask your nurse or doctor.        aspirin EC 81 MG tablet Take 81 mg by mouth daily.   Calcium Carbonate-Vitamin D 600-400 MG-UNIT tablet Take 1 tablet by mouth 2 (two) times daily.   Dexilant 30 MG capsule Generic drug: Dexlansoprazole Take 1 capsule by mouth daily.   estradiol 0.5 MG tablet Commonly known as: ESTRACE Take 0.5 mg by mouth daily.   fluticasone 50 MCG/ACT nasal spray Commonly known as: FLONASE Place 2 sprays into both nostrils daily.   gabapentin 300 MG capsule Commonly known as: NEURONTIN Take 300 mg by mouth at bedtime.   metoCLOPramide 5 MG tablet Commonly known as: REGLAN 1 tab in the morning, 1 tab at bedtime   mirabegron ER 25 MG Tb24 tablet Commonly known as: MYRBETRIQ Take 1 tablet (25 mg total) by mouth daily.   ondansetron 4 MG disintegrating tablet Commonly known as: ZOFRAN-ODT Take 4 mg by mouth every 8 (eight) hours as needed.   oxyCODONE-acetaminophen 5-325 MG tablet Commonly known as: PERCOCET/ROXICET Take 1 tablet by mouth every 6 (six) hours as needed for severe pain.   STOOL SOFTENER PO Take 1 tablet by mouth daily.   sucralfate 1 g tablet Commonly known as: CARAFATE Take by mouth.   sulfamethoxazole-trimethoprim 800-160 MG tablet Commonly known as: BACTRIM DS Take 1 tablet by mouth every 12 (twelve) hours.   tamsulosin 0.4 MG Caps capsule Commonly known as: FLOMAX Take 1 capsule (0.4 mg total) by mouth daily.   topiramate 50 MG tablet Commonly known as:  TOPAMAX Take by mouth.   vitamin C 500 MG tablet Commonly known as: ASCORBIC ACID Take 1,000 mg by mouth 2 (two) times daily.       Allergies:  Allergies  Allergen Reactions  . Avelox [Moxifloxacin Hcl In Nacl] Other (See Comments)    Redness all over in about 5 minutes after taking  . Codeine Nausea Only    REACTION: nausea  . Acyclovir And Related   . Shrimp [Shellfish Allergy]  Hives    . Ciprofloxacin Rash  . Moxifloxacin Hives and Rash    Other reaction(s): OTHER  . Sucralfate Rash    Itching     Family History: Family History  Problem Relation Age of Onset  . Heart attack Mother 44  . Heart failure Mother   . Heart attack Father   . Heart disease Father        CABG  . Heart attack Brother 65       premature  . Breast cancer Sister 1    Social History:  reports that she has never smoked. She has never used smokeless tobacco. She reports that she does not drink alcohol and does not use drugs.  ROS: Pertinent ROS in HPI  Physical Exam: BP 103/68   Pulse (!) 102   Ht 5\' 5"  (1.651 m)   Wt 131 lb (59.4 kg)   BMI 21.80 kg/m   Constitutional:  Well nourished. Alert and oriented, No acute distress. HEENT: [place] AT, mask in place.  Trachea midline Cardiovascular: No clubbing, cyanosis, or edema. Respiratory: Normal respiratory effort, no increased work of breathing. Neurologic: Grossly intact, no focal deficits, moving all 4 extremities. Psychiatric: Normal mood and affect.   Laboratory Data: Component     Latest Ref Rng & Units 01/02/2021  Specific Gravity, UA     1.005 - 1.030 1.025  pH, UA     5.0 - 7.5 6.0  Color, UA     Yellow Brown (A)  Appearance Ur     Clear Cloudy (A)  Leukocytes,UA     Negative Negative  Protein,UA     Negative/Trace 2+ (A)  Glucose, UA     Negative Negative  Ketones, UA     Negative Trace (A)  RBC, UA     Negative 3+ (A)  Bilirubin, UA     Negative Negative  Urobilinogen, Ur     0.2 - 1.0 mg/dL 4.0 (H)   Nitrite, UA     Negative Negative  Microscopic Examination      See below:   Component     Latest Ref Rng & Units 01/02/2021  WBC, UA     0 - 5 /hpf 0-5  RBC     0 - 2 /hpf >30 (A)  Epithelial Cells (non renal)     0 - 10 /hpf 0-10  Crystals     N/A Present (A)  Crystal Type     N/A Calcium Oxalate  Bacteria, UA     None seen/Few Few  I have reviewed the labs.   Pertinent Imaging: CLINICAL DATA:  Right-sided flank pain with history kidney stones.  EXAM: ABDOMEN - 1 VIEW  COMPARISON:  03/09/2019  FINDINGS: Two supine views. Moderate convex left lumbar spine curvature. Cholecystectomy clips. Moderate amount of colonic stool. 4 mm calcification projecting over the right interpolar kidney.  Left abdominal calcifications of up to 1.0 cm.  5 mm upper right pelvic calcification.  IMPRESSION: Bilateral abdominal calcifications which may represent renal calculi or be within the bowel.  An upper right pelvic calcification for which ureteric stone cannot be excluded.   Electronically Signed   By: Abigail Miyamoto M.D.   On: 01/03/2021 10:58    CLINICAL DATA:  Right flank pain and hematuria. Rule out kidney stone.  EXAM: CT ABDOMEN AND PELVIS WITHOUT CONTRAST  TECHNIQUE: Multidetector CT imaging of the abdomen and pelvis was performed following the standard protocol without IV contrast.  COMPARISON:  08/22/2013  FINDINGS: Lower  chest: No acute abnormality. Subpleural interstitial reticulation is identified within both lung bases.  Hepatobiliary: No focal liver abnormality is seen. Status post cholecystectomy. No biliary dilatation.  Pancreas: Unremarkable. No pancreatic ductal dilatation or surrounding inflammatory changes.  Spleen: Normal in size without focal abnormality.  Adrenals/Urinary Tract: Normal adrenal glands.  Right kidney stone measures 3 mm, image 29/2. No left kidney stone. No mass or hydronephrosis identified  bilaterally. Calcification along the course of the right ureter measures 5 mm. Cannot rule out nonobstructing distal ureteral calculus. Left scratch set no left-sided hydronephrosis or hydroureter. No left-sided ureteral calculi. Urinary bladder appears normal.  Stomach/Bowel: Moderate hiatal hernia. Appendix not confidently identified. No evidence of bowel wall thickening, distention, or inflammatory changes.  Vascular/Lymphatic: Aortic atherosclerosis. No enlarged abdominal or pelvic lymph nodes.  Reproductive: Status post hysterectomy. No adnexal masses.  Other: Small fat containing umbilical hernia. No abdominopelvic ascites.  Musculoskeletal: Scoliosis and degenerative disc disease noted.  IMPRESSION: 1. Calcification along the course of the right ureter measures 5 mm. No hydronephrosis or hydroureter. Cannot rule out nonobstructing distal ureteral calculus. 2. Hiatal hernia. 3. Aortic atherosclerosis.  Aortic Atherosclerosis (ICD10-I70.0).   Electronically Signed   By: Kerby Moors M.D.   On: 01/06/2021 15:28  Component     Latest Ref Rng & Units 01/02/2021  Specific Gravity, UA     1.005 - 1.030 1.025  pH, UA     5.0 - 7.5 6.0  Color, UA     Yellow Brown (A)  Appearance Ur     Clear Cloudy (A)  Leukocytes,UA     Negative Negative  Protein,UA     Negative/Trace 2+ (A)  Glucose, UA     Negative Negative  Ketones, UA     Negative Trace (A)  RBC, UA     Negative 3+ (A)  Bilirubin, UA     Negative Negative  Urobilinogen, Ur     0.2 - 1.0 mg/dL 4.0 (H)  Nitrite, UA     Negative Negative  Microscopic Examination      See below:   Component     Latest Ref Rng & Units 01/02/2021  WBC, UA     0 - 5 /hpf 0-5  RBC     0 - 2 /hpf >30 (A)  Epithelial Cells (non renal)     0 - 10 /hpf 0-10  Crystals     N/A Present (A)  Crystal Type     N/A Calcium Oxalate  Bacteria, UA     None seen/Few Few   I have independently reviewed the films.  SD  < 1500 HU  SSD < 15 cm    Assessment & Plan:    1. Right ureteral stone -We discussed various treatment options for urolithiasis including observation with or without medical expulsive therapy, shockwave lithotripsy (SWL), ureteroscopy and laser lithotripsy with stent placement. -We discussed that management is based on stone size, location, density, patient co-morbidities, and patient preference.  -We discussed stones <19mm in size have a >80% spontaneous passage rate. Data surrounding the use of tamsulosin for medical expulsive therapy is controversial, but meta analyses suggests it is most efficacious for distal stones between 5-81mm in size. Possible side effects include dizziness/lightheadedness -We discussed SWL has a lower stone free rate in a single procedure, but also a lower complication rate compared to ureteroscopy and avoids a stent and associated stent related symptoms. Possible complications include renal hematoma, steinstrasse, and need for additional treatment. -We discussed ureteroscopy with laser  lithotripsy and stent placement has a higher stone free rate than SWL in a single procedure, however increased complication rate including possible infection, ureteral injury, bleeding, and stent related morbidity. Common stent related symptoms include dysuria, urgency/frequency, and flank pain. -After an extensive discussion of the risks and benefits of the above treatment options, she wanted to pursue medical expulsive therapy -stone has not passed -she would like to pursue right ESWL at this time -I reviewed red flag signs such as pain that is not controlled with pain medication, fevers of 101 or higher, intense chills or rigors and/or vomiting and that the symptoms needed to be addressed immediately by seeking care in the emergency department -Continue Percocet 01/15/2024, #10, 1 tablet every 6 hours as needed for pain  -Continue tamsulosin 0.4 mg daily -Continue to strain  urine -Continue to stop ASA  -UA -Urine culture negative   Return for right ESWL .  These notes generated with voice recognition software. I apologize for typographical errors.  Zara Council, PA-C  [facility] [address]  [place], [place] [address] [phone]

## 2021-01-07 ENCOUNTER — Other Ambulatory Visit: Payer: Self-pay | Admitting: Family Medicine

## 2021-01-07 ENCOUNTER — Encounter: Payer: Self-pay | Admitting: Urology

## 2021-01-07 ENCOUNTER — Ambulatory Visit
Admission: RE | Admit: 2021-01-07 | Discharge: 2021-01-07 | Disposition: A | Discharge status: Home / Self Care | Payer: PPO | Source: Ambulatory Visit | Attending: Urology | Admitting: Urology

## 2021-01-07 ENCOUNTER — Ambulatory Visit
Admission: RE | Admit: 2021-01-07 | Discharge: 2021-01-07 | Disposition: A | Discharge status: Home / Self Care | Payer: PPO | Attending: Urology | Admitting: Urology

## 2021-01-07 ENCOUNTER — Ambulatory Visit (INDEPENDENT_AMBULATORY_CARE_PROVIDER_SITE_OTHER): Payer: PPO | Admitting: Urology

## 2021-01-07 VITALS — BP 103/68 | HR 102 | Ht 65.0 in | Wt 131.0 lb

## 2021-01-07 DIAGNOSIS — M160 Bilateral primary osteoarthritis of hip: Secondary | ICD-10-CM | POA: Diagnosis not present

## 2021-01-07 DIAGNOSIS — R310 Gross hematuria: Secondary | ICD-10-CM

## 2021-01-07 DIAGNOSIS — M47816 Spondylosis without myelopathy or radiculopathy, lumbar region: Secondary | ICD-10-CM | POA: Diagnosis not present

## 2021-01-07 DIAGNOSIS — Z87442 Personal history of urinary calculi: Secondary | ICD-10-CM | POA: Insufficient documentation

## 2021-01-07 DIAGNOSIS — M419 Scoliosis, unspecified: Secondary | ICD-10-CM | POA: Diagnosis not present

## 2021-01-07 DIAGNOSIS — N202 Calculus of kidney with calculus of ureter: Secondary | ICD-10-CM | POA: Diagnosis not present

## 2021-01-08 ENCOUNTER — Other Ambulatory Visit: Payer: Self-pay | Admitting: Urology

## 2021-01-08 DIAGNOSIS — I5041 Acute combined systolic (congestive) and diastolic (congestive) heart failure: Secondary | ICD-10-CM | POA: Diagnosis not present

## 2021-01-08 DIAGNOSIS — E782 Mixed hyperlipidemia: Secondary | ICD-10-CM | POA: Diagnosis not present

## 2021-01-08 DIAGNOSIS — Z0000 Encounter for general adult medical examination without abnormal findings: Secondary | ICD-10-CM | POA: Diagnosis not present

## 2021-01-08 DIAGNOSIS — E538 Deficiency of other specified B group vitamins: Secondary | ICD-10-CM | POA: Diagnosis not present

## 2021-01-08 DIAGNOSIS — I38 Endocarditis, valve unspecified: Secondary | ICD-10-CM | POA: Diagnosis not present

## 2021-01-08 DIAGNOSIS — N201 Calculus of ureter: Secondary | ICD-10-CM

## 2021-01-08 MED ORDER — CEPHALEXIN 250 MG PO CAPS: 500.0000 mg | ORAL_CAPSULE | ORAL | Status: AC

## 2021-01-08 MED ORDER — DIAZEPAM 5 MG PO TABS: 10.0000 mg | ORAL_TABLET | ORAL | Status: AC

## 2021-01-08 MED ORDER — SODIUM CHLORIDE 0.9 % IV SOLN: INTRAVENOUS | Status: DC

## 2021-01-08 MED ORDER — DIPHENHYDRAMINE HCL 25 MG PO CAPS: 25.0000 mg | ORAL_CAPSULE | ORAL | Status: AC

## 2021-01-08 MED ORDER — ONDANSETRON HCL 4 MG/2ML IJ SOLN: 4.0000 mg | Freq: Once | INTRAMUSCULAR | Status: AC | PRN

## 2021-01-09 ENCOUNTER — Other Ambulatory Visit: Admission: RE | Admit: 2021-01-09 | Payer: PPO | Source: Ambulatory Visit

## 2021-01-09 ENCOUNTER — Ambulatory Visit: Payer: PPO

## 2021-01-09 ENCOUNTER — Encounter
Admission: RE | Disposition: A | Discharge status: Home / Self Care | Payer: Self-pay | Source: Home / Self Care | Attending: Urology

## 2021-01-09 ENCOUNTER — Other Ambulatory Visit: Payer: Self-pay

## 2021-01-09 ENCOUNTER — Encounter: Payer: Self-pay | Admitting: Urology

## 2021-01-09 ENCOUNTER — Ambulatory Visit
Admission: RE | Admit: 2021-01-09 | Discharge: 2021-01-09 | Disposition: A | Discharge status: Home / Self Care | Payer: PPO | Attending: Urology | Admitting: Urology

## 2021-01-09 DIAGNOSIS — Z79899 Other long term (current) drug therapy: Secondary | ICD-10-CM | POA: Insufficient documentation

## 2021-01-09 DIAGNOSIS — Z881 Allergy status to other antibiotic agents status: Secondary | ICD-10-CM | POA: Insufficient documentation

## 2021-01-09 DIAGNOSIS — Z888 Allergy status to other drugs, medicaments and biological substances status: Secondary | ICD-10-CM | POA: Diagnosis not present

## 2021-01-09 DIAGNOSIS — Z87442 Personal history of urinary calculi: Secondary | ICD-10-CM | POA: Insufficient documentation

## 2021-01-09 DIAGNOSIS — Z96653 Presence of artificial knee joint, bilateral: Secondary | ICD-10-CM | POA: Diagnosis not present

## 2021-01-09 DIAGNOSIS — Z7982 Long term (current) use of aspirin: Secondary | ICD-10-CM | POA: Diagnosis not present

## 2021-01-09 DIAGNOSIS — K5900 Constipation, unspecified: Secondary | ICD-10-CM | POA: Diagnosis not present

## 2021-01-09 DIAGNOSIS — Z91013 Allergy to seafood: Secondary | ICD-10-CM | POA: Diagnosis not present

## 2021-01-09 DIAGNOSIS — M47816 Spondylosis without myelopathy or radiculopathy, lumbar region: Secondary | ICD-10-CM | POA: Diagnosis not present

## 2021-01-09 DIAGNOSIS — Z87440 Personal history of urinary (tract) infections: Secondary | ICD-10-CM | POA: Insufficient documentation

## 2021-01-09 DIAGNOSIS — N3941 Urge incontinence: Secondary | ICD-10-CM | POA: Diagnosis not present

## 2021-01-09 DIAGNOSIS — R109 Unspecified abdominal pain: Secondary | ICD-10-CM

## 2021-01-09 DIAGNOSIS — N201 Calculus of ureter: Secondary | ICD-10-CM | POA: Insufficient documentation

## 2021-01-09 DIAGNOSIS — M4186 Other forms of scoliosis, lumbar region: Secondary | ICD-10-CM | POA: Diagnosis not present

## 2021-01-09 DIAGNOSIS — N2889 Other specified disorders of kidney and ureter: Secondary | ICD-10-CM | POA: Diagnosis not present

## 2021-01-09 HISTORY — PX: EXTRACORPOREAL SHOCK WAVE LITHOTRIPSY: SHX1557

## 2021-01-09 SURGERY — LITHOTRIPSY, ESWL
Anesthesia: Moderate Sedation | Laterality: Right

## 2021-01-09 MED ORDER — SODIUM CHLORIDE FLUSH 0.9 % IV SOLN
INTRAVENOUS | Status: AC
  Filled 2021-01-09: qty 10

## 2021-01-09 MED ORDER — ONDANSETRON HCL 4 MG/2ML IJ SOLN
INTRAMUSCULAR | Status: AC
  Administered 2021-01-09: 4 mg via INTRAVENOUS
  Filled 2021-01-09: qty 2

## 2021-01-09 MED ORDER — DIPHENHYDRAMINE HCL 25 MG PO CAPS
ORAL_CAPSULE | ORAL | Status: AC
  Administered 2021-01-09: 25 mg via ORAL
  Filled 2021-01-09: qty 1

## 2021-01-09 MED ORDER — OXYCODONE-ACETAMINOPHEN 5-325 MG PO TABS: 1.0000 | ORAL_TABLET | Freq: Four times a day (QID) | ORAL | 0 refills | Status: DC | PRN

## 2021-01-09 MED ORDER — DIAZEPAM 5 MG PO TABS
ORAL_TABLET | ORAL | Status: AC
  Administered 2021-01-09: 10 mg via ORAL
  Filled 2021-01-09: qty 2

## 2021-01-09 NOTE — Discharge Instructions (Signed)
See [facility] discharge instructions in chart.  

## 2021-01-09 NOTE — Interval H&P Note (Signed)
History and Physical Interval Note:  01/09/2021 8:36 AM  Jocelyn Hooper  has presented today for surgery, with the diagnosis of KIDNEY STONE.  The various methods of treatment have been discussed with the patient and family. After consideration of risks, benefits and other options for treatment, the patient has consented to  Procedure(s): EXTRACORPOREAL SHOCK WAVE LITHOTRIPSY (ESWL) (Right) as a surgical intervention.  The patient's history has been reviewed, patient examined, no change in status, stable for surgery.  I have reviewed the patient's chart and labs.  Questions were answered to the patient's satisfaction.    [facility]  Hollice Espy

## 2021-01-10 ENCOUNTER — Encounter: Payer: Self-pay | Admitting: Urology

## 2021-01-13 ENCOUNTER — Ambulatory Visit: Payer: PPO | Admitting: Anesthesiology

## 2021-01-13 ENCOUNTER — Ambulatory Visit
Admission: RE | Admit: 2021-01-13 | Discharge: 2021-01-13 | Disposition: A | Discharge status: Home / Self Care | Payer: PPO | Source: Ambulatory Visit | Attending: Internal Medicine | Admitting: Internal Medicine

## 2021-01-13 ENCOUNTER — Encounter
Admission: RE | Disposition: A | Discharge status: Home / Self Care | Payer: Self-pay | Source: Ambulatory Visit | Attending: Internal Medicine

## 2021-01-13 ENCOUNTER — Encounter: Payer: Self-pay | Admitting: Internal Medicine

## 2021-01-13 DIAGNOSIS — R110 Nausea: Secondary | ICD-10-CM | POA: Diagnosis not present

## 2021-01-13 DIAGNOSIS — R634 Abnormal weight loss: Secondary | ICD-10-CM | POA: Diagnosis not present

## 2021-01-13 DIAGNOSIS — K319 Disease of stomach and duodenum, unspecified: Secondary | ICD-10-CM | POA: Insufficient documentation

## 2021-01-13 DIAGNOSIS — Z881 Allergy status to other antibiotic agents status: Secondary | ICD-10-CM | POA: Diagnosis not present

## 2021-01-13 DIAGNOSIS — R194 Change in bowel habit: Secondary | ICD-10-CM | POA: Diagnosis not present

## 2021-01-13 DIAGNOSIS — K259 Gastric ulcer, unspecified as acute or chronic, without hemorrhage or perforation: Secondary | ICD-10-CM | POA: Diagnosis not present

## 2021-01-13 DIAGNOSIS — M1990 Unspecified osteoarthritis, unspecified site: Secondary | ICD-10-CM | POA: Diagnosis not present

## 2021-01-13 DIAGNOSIS — Z8719 Personal history of other diseases of the digestive system: Secondary | ICD-10-CM | POA: Diagnosis not present

## 2021-01-13 DIAGNOSIS — Z91013 Allergy to seafood: Secondary | ICD-10-CM | POA: Insufficient documentation

## 2021-01-13 DIAGNOSIS — K5730 Diverticulosis of large intestine without perforation or abscess without bleeding: Secondary | ICD-10-CM | POA: Insufficient documentation

## 2021-01-13 DIAGNOSIS — Z7982 Long term (current) use of aspirin: Secondary | ICD-10-CM | POA: Insufficient documentation

## 2021-01-13 DIAGNOSIS — K2970 Gastritis, unspecified, without bleeding: Secondary | ICD-10-CM | POA: Diagnosis not present

## 2021-01-13 DIAGNOSIS — Z79890 Hormone replacement therapy (postmenopausal): Secondary | ICD-10-CM | POA: Diagnosis not present

## 2021-01-13 DIAGNOSIS — K449 Diaphragmatic hernia without obstruction or gangrene: Secondary | ICD-10-CM | POA: Insufficient documentation

## 2021-01-13 DIAGNOSIS — Z96653 Presence of artificial knee joint, bilateral: Secondary | ICD-10-CM | POA: Diagnosis not present

## 2021-01-13 DIAGNOSIS — K5900 Constipation, unspecified: Secondary | ICD-10-CM | POA: Diagnosis not present

## 2021-01-13 DIAGNOSIS — K635 Polyp of colon: Secondary | ICD-10-CM | POA: Diagnosis not present

## 2021-01-13 DIAGNOSIS — Z79899 Other long term (current) drug therapy: Secondary | ICD-10-CM | POA: Insufficient documentation

## 2021-01-13 DIAGNOSIS — K6389 Other specified diseases of intestine: Secondary | ICD-10-CM | POA: Diagnosis not present

## 2021-01-13 DIAGNOSIS — Z885 Allergy status to narcotic agent status: Secondary | ICD-10-CM | POA: Diagnosis not present

## 2021-01-13 DIAGNOSIS — R1013 Epigastric pain: Secondary | ICD-10-CM | POA: Diagnosis not present

## 2021-01-13 HISTORY — PX: COLONOSCOPY WITH PROPOFOL: SHX5780

## 2021-01-13 HISTORY — PX: ESOPHAGOGASTRODUODENOSCOPY (EGD) WITH PROPOFOL: SHX5813

## 2021-01-13 SURGERY — ESOPHAGOGASTRODUODENOSCOPY (EGD) WITH PROPOFOL
Anesthesia: General

## 2021-01-13 MED ORDER — PROPOFOL 500 MG/50ML IV EMUL
INTRAVENOUS | Status: AC
  Filled 2021-01-13: qty 100

## 2021-01-13 MED ORDER — SODIUM CHLORIDE 0.9 % IV SOLN: INTRAVENOUS | Status: DC

## 2021-01-13 MED ORDER — PROPOFOL 10 MG/ML IV BOLUS
INTRAVENOUS | Status: DC | PRN
  Administered 2021-01-13: 20 mg via INTRAVENOUS
  Administered 2021-01-13: 30 mg via INTRAVENOUS
  Administered 2021-01-13: 20 mg via INTRAVENOUS

## 2021-01-13 MED ORDER — LIDOCAINE HCL (CARDIAC) PF 100 MG/5ML IV SOSY
PREFILLED_SYRINGE | INTRAVENOUS | Status: DC | PRN
  Administered 2021-01-13: 60 mg via INTRATRACHEAL

## 2021-01-13 MED ORDER — PROPOFOL 500 MG/50ML IV EMUL
INTRAVENOUS | Status: DC | PRN
  Administered 2021-01-13: 100 ug/kg/min via INTRAVENOUS

## 2021-01-13 NOTE — Anesthesia Preprocedure Evaluation (Signed)
Anesthesia Evaluation  Patient identified by MRN, date of birth, ID band Patient awake    Reviewed: Allergy & Precautions, H&P , NPO status , Patient's Chart, lab work & pertinent test results  History of Anesthesia Complications (+) history of anesthetic complications  Airway Mallampati: III  TM Distance: <3 FB Neck ROM: limited    Dental  (+) Chipped   Pulmonary neg pulmonary ROS, neg shortness of breath,    Pulmonary exam normal        Cardiovascular Exercise Tolerance: Good (-) angina+ CAD  Normal cardiovascular exam     Neuro/Psych  Headaches,  Neuromuscular disease negative psych ROS   GI/Hepatic Neg liver ROS, hiatal hernia, GERD  Medicated and Controlled,  Endo/Other  negative endocrine ROS  Renal/GU Renal disease  negative genitourinary   Musculoskeletal  (+) Arthritis ,   Abdominal   Peds  Hematology negative hematology ROS (+)   Anesthesia Other Findings Past Medical History: No date: Bursitis of left shoulder No date: Chest pain, unspecified No date: Chronic kidney disease     Comment:  KIDNEY STONE CURRENTLY-08-2015-SEES DR COPE No date: Complication of anesthesia     Comment:  PT HAS SEVERE SCOLIOSIS AND FOR TKR ANESTHESIA COULD NOT              GET A SPINAL No date: Coronary artery disease     Comment:  ascvd No date: CSF leak No date: DDD (degenerative disc disease), cervical No date: Diverticulosis No date: Esophageal reflux No date: Fibrocystic breast disease No date: GERD (gastroesophageal reflux disease) No date: Headache     Comment:  H/O No date: History of colon polyps No date: History of hiatal hernia No date: History of kidney stones No date: IBS (irritable bowel syndrome) No date: Nasal polyps No date: Other and unspecified hyperlipidemia No date: Scoliosis No date: Tinnitus of both ears No date: Trigeminal neuralgia syndrome No date: Unspecified sinusitis (chronic) No  date: Vitamin B12 deficiency  Past Surgical History: No date: ABDOMINAL HYSTERECTOMY No date: APPENDECTOMY 1982: BACK SURGERY     Comment:  disc No date: CARDIAC CATHETERIZATION     Comment:  [facility]  No date: CATARACT EXTRACTION     Comment:  left  No date: CATARACT EXTRACTION     Comment:  right 2002: CHOLECYSTECTOMY 03/10/2017: COLONOSCOPY WITH PROPOFOL; N/A     Comment:  Procedure: COLONOSCOPY WITH PROPOFOL;  Surgeon: Manya Silvas, MD;  Location: [facility] ENDOSCOPY;  Service:               Endoscopy;  Laterality: N/A; 01/09/2021: EXTRACORPOREAL SHOCK WAVE LITHOTRIPSY; Right     Comment:  Procedure: EXTRACORPOREAL SHOCK WAVE LITHOTRIPSY (ESWL);              Surgeon: Hollice Espy, MD;  Location: [facility] ORS;                Service: Urology;  Laterality: Right; No date: EYE SURGERY; Bilateral     Comment:  Cataract Extraction with IOL 1974: hysterectomy (other) No date: JOINT REPLACEMENT     Comment:  LEFT knee No date: KIDNEY STONE SURGERY 09/27/2017: KNEE ARTHROPLASTY; Right     Comment:  Procedure: COMPUTER ASSISTED TOTAL KNEE ARTHROPLASTY;                Surgeon: Dereck Leep, MD;  Location: [facility] ORS;  Service: Orthopedics;  Laterality: Right; 09/13/2015: KNEE ARTHROSCOPY; Right     Comment:  Procedure: ARTHROSCOPY KNEE, PARTIAL LATERAL               MENISECTOMY, CHONDROPLASTY ;  Surgeon: Dereck Leep,               MD;  Location: [facility] ORS;  Service: Orthopedics;                Laterality: Right; 2004: KNEE SURGERY     Comment:  having a lot of problems with left knee 1967, 1988, 1993, 2003, 2012: NASAL SINUS SURGERY 1970,1990,1994,2005: sinus procedures No date: TONSILLECTOMY  BMI    Body Mass Index: 21.80 kg/m      Reproductive/Obstetrics negative OB ROS                             Anesthesia Physical Anesthesia Plan  ASA: III  Anesthesia Plan: General   Post-op Pain Management:     Induction: Intravenous  PONV Risk Score and Plan: Propofol infusion and TIVA  Airway Management Planned: Natural Airway and Nasal Cannula  Additional Equipment:   Intra-op Plan:   Post-operative Plan:   Informed Consent: I have reviewed the patients History and Physical, chart, labs and discussed the procedure including the risks, benefits and alternatives for the proposed anesthesia with the patient or authorized representative who has indicated his/her understanding and acceptance.     Dental Advisory Given  Plan Discussed with: Anesthesiologist, CRNA and Surgeon  Anesthesia Plan Comments: (Patient consented for risks of anesthesia including but not limited to:  - adverse reactions to medications - risk of airway placement if required - damage to eyes, teeth, lips or other oral mucosa - nerve damage due to positioning  - sore throat or hoarseness - Damage to heart, brain, nerves, lungs, other parts of body or loss of life  Patient voiced understanding.)        Anesthesia Quick Evaluation

## 2021-01-13 NOTE — Transfer of Care (Signed)
Immediate Anesthesia Transfer of Care Note  Patient: Jocelyn Hooper  Procedure(s) Performed: ESOPHAGOGASTRODUODENOSCOPY (EGD) WITH PROPOFOL (N/A ) COLONOSCOPY WITH PROPOFOL (N/A )  Patient Location: PACU  Anesthesia Type:General  Level of Consciousness: drowsy and patient cooperative  Airway & Oxygen Therapy: Patient Spontanous Breathing and Patient connected to face mask oxygen  Post-op Assessment: Report given to RN and Post -op Vital signs reviewed and stable  Post vital signs: Reviewed and stable  Last Vitals:  Vitals Value Taken Time  BP 122/69 01/13/21 1439  Temp    Pulse 89 01/13/21 1440  Resp 21 01/13/21 1440  SpO2 99 % 01/13/21 1440  Vitals shown include unvalidated device data.  Last Pain:  Vitals:   01/13/21 1257  TempSrc: Temporal  PainSc: 0-No pain         Complications: No complications documented.

## 2021-01-13 NOTE — Interval H&P Note (Signed)
History and Physical Interval Note:  01/13/2021 1:51 PM  [employer]  has presented today for surgery, with the diagnosis of [place].  The various methods of treatment have been discussed with the patient and family. After consideration of risks, benefits and other options for treatment, the patient has consented to  Procedure(s): ESOPHAGOGASTRODUODENOSCOPY (EGD) WITH PROPOFOL (N/A) COLONOSCOPY WITH PROPOFOL (N/A) as a surgical intervention.  The patient's history has been reviewed, patient examined, no change in status, stable for surgery.  I have reviewed the patient's chart and labs.  Questions were answered to the patient's satisfaction.     [place], [place] [address]

## 2021-01-13 NOTE — Op Note (Signed)
[facility] Gastroenterology Patient Name: Erie Sica Procedure Date: 01/13/2021 1:44 PM MRN: [id number] Account #: [id number] Date of Birth: April 14, 1941 Admit Type: Outpatient Age: 80 Room: [facility] ENDO ROOM 2 Gender: Female Note Status: Finalized Procedure:             Colonoscopy Indications:           Change in bowel habits, Constipation, Weight loss Providers:             Benay Pike. Keaten Mashek MD, MD Medicines:             Propofol per Anesthesia Complications:         No immediate complications. Procedure:             Pre-Anesthesia Assessment:                        - The risks and benefits of the procedure and the                         sedation options and risks were discussed with the                         patient. All questions were answered and informed                         consent was obtained.                        - Patient identification and proposed procedure were                         verified prior to the procedure by the nurse. The                         procedure was verified in the procedure room.                        - ASA Grade Assessment: III - A patient with severe                         systemic disease.                        - After reviewing the risks and benefits, the patient                         was deemed in satisfactory condition to undergo the                         procedure.                        After obtaining informed consent, the colonoscope was                         passed under direct vision. Throughout the procedure,                         the patient's blood pressure, pulse, and oxygen  saturations were monitored continuously. The                         Colonoscope was introduced through the anus and                         advanced to the the cecum, identified by appendiceal                         orifice and ileocecal valve. The colonoscopy was                         performed  without difficulty. The patient tolerated                         the procedure well. The quality of the bowel                         preparation was adequate to identify polyps 6 mm and                         larger in size. The ileocecal valve, appendiceal                         orifice, and rectum were photographed. Findings:      The perianal and digital rectal examinations were normal. Pertinent       negatives include normal sphincter tone and no palpable rectal lesions.      Many medium-mouthed diverticula were found in the sigmoid colon.      A 7 mm polyp was found in the proximal ascending colon. The polyp was       sessile. The polyp was removed with a saline injection-lift technique       using a hot snare. Resection and retrieval were complete.      A 7 mm polyp was found in the proximal ascending colon. The polyp was       sessile. The polyp was removed with a hot snare. Resection and retrieval       were complete.      The exam was otherwise without abnormality. Impression:            - Diverticulosis in the sigmoid colon.                        - One 7 mm polyp in the proximal ascending colon,                         removed using injection-lift and a hot snare. Resected                         and retrieved.                        - One 7 mm polyp in the proximal ascending colon,                         removed with a hot snare. Resected and retrieved.                        -  The examination was otherwise normal. Recommendation:        - Patient has a contact number available for                         emergencies. The signs and symptoms of potential                         delayed complications were discussed with the patient.                         Return to normal activities tomorrow. Written                         discharge instructions were provided to the patient.                        - Resume previous diet.                        - Continue present  medications.                        - If polyps are benign or adenomatous without                         dysplasia, I will advise NO further colonoscopy due to                         advanced age and/or severe comorbidity.                        - You do NOT require further colon cancer screening                         measures (Annual stool testing (i.e. hemoccult, FIT,                         cologuard), sigmoidoscopy, colonoscopy or CT                         colonography). You should share this recommendation                         with your Primary Care provider.                        - Optimize laxative therapy.                        - Return to GI office PRN.                        - The findings and recommendations were discussed with                         the patient. Procedure Code(s):     --- Professional ---                        [phone], Colonoscopy, flexible; with removal of  tumor(s), polyp(s), or other lesion(s) by snare                         technique                        [id number], Colonoscopy, flexible; with directed submucosal                         injection(s), any substance Diagnosis Code(s):     --- Professional ---                        K57.30, Diverticulosis of large intestine without                         perforation or abscess without bleeding                        R63.4, Abnormal weight loss                        K59.00, Constipation, unspecified                        R19.4, Change in bowel habit                        K63.5, Polyp of colon CPT copyright 2019 [employer]. All rights reserved. The codes documented in this report are preliminary and upon coder review may  be revised to meet current compliance requirements. Efrain Sella MD, MD 01/13/2021 2:38:16 PM This report has been signed electronically. Number of Addenda: 0 Note Initiated On: 01/13/2021 1:44 PM Scope Withdrawal Time: 0 hours 6  minutes 37 seconds  Total Procedure Duration: 0 hours 25 minutes 49 seconds  Estimated Blood Loss:  Estimated blood loss: none.      [facility]

## 2021-01-13 NOTE — Op Note (Signed)
[facility] Gastroenterology Patient Name: Jocelyn Hooper Procedure Date: 01/13/2021 1:45 PM MRN: [id number] Account #: [id number] Date of Birth: 1941/01/09 Admit Type: Outpatient Age: 80 Room: [facility] ENDO ROOM 2 Gender: Female Note Status: Finalized Procedure:             Upper GI endoscopy Indications:           Dyspepsia, Nausea, Weight loss Providers:             Benay Pike. Chaise Mahabir MD, MD Medicines:             Propofol per Anesthesia Complications:         No immediate complications. Procedure:             Pre-Anesthesia Assessment:                        - The risks and benefits of the procedure and the                         sedation options and risks were discussed with the                         patient. All questions were answered and informed                         consent was obtained.                        - Patient identification and proposed procedure were                         verified prior to the procedure by the nurse. The                         procedure was verified in the procedure room.                        - ASA Grade Assessment: III - A patient with severe                         systemic disease.                        - After reviewing the risks and benefits, the patient                         was deemed in satisfactory condition to undergo the                         procedure.                        After obtaining informed consent, the endoscope was                         passed under direct vision. Throughout the procedure,                         the patient's blood pressure, pulse, and oxygen  saturations were monitored continuously. The Endoscope                         was introduced through the mouth, and advanced to the                         third part of duodenum. The upper GI endoscopy was                         accomplished without difficulty. The patient tolerated                         the  procedure well. Findings:      The esophagus was normal.      A 3 cm hiatal hernia was present.      Patchy mild inflammation characterized by erosions and erythema was       found in the gastric antrum. Biopsies were taken with a cold forceps for       Helicobacter pylori testing.      The examined duodenum was normal.      The exam was otherwise without abnormality. Impression:            - Normal esophagus.                        - 3 cm hiatal hernia.                        - Gastritis. Biopsied.                        - Normal examined duodenum.                        - The examination was otherwise normal. Recommendation:        - Await pathology results.                        - Proceed with colonoscopy Procedure Code(s):     --- Professional ---                        [phone], Esophagogastroduodenoscopy, flexible,                         transoral; with biopsy, single or multiple Diagnosis Code(s):     --- Professional ---                        R63.4, Abnormal weight loss                        R11.0, Nausea                        R10.13, Epigastric pain                        K29.70, Gastritis, unspecified, without bleeding                        K44.9, Diaphragmatic hernia without obstruction or  gangrene CPT copyright 2019 American Medical Association. All rights reserved. The codes documented in this report are preliminary and upon coder review may  be revised to meet current compliance requirements. Efrain Sella MD, MD 01/13/2021 2:04:55 PM This report has been signed electronically. Number of Addenda: 0 Note Initiated On: 01/13/2021 1:45 PM Estimated Blood Loss:  Estimated blood loss: none.      [facility]

## 2021-01-13 NOTE — H&P (Signed)
Outpatient short stay form Pre-procedure 01/13/2021 1:47 PM Jocelyn Hooper K. Alice Reichert, M.D.  Primary Physician: Emily Filbert, MD  Reason for visit: Nausea, weight loss, change in bowel habits, abdominal bloating.  History of present illness: Patient is a pleasant 80 year old female presenting for persistent abdominal bloating and constipation with intermittent episodes of fecal incontinence for the last 3 months.  There has been no rectal bleeding.  There is been approximately 15 pound weight loss over the last 3 months.  Nausea has improved somewhat but abdominal bloating and constipation has continued.  There seems to be some postobstructive diarrhea symptoms after taking milk of magnesia.  Last colonoscopy in 2018 showed diverticulosis and internal hemorrhoids.    Current Facility-Administered Medications:  .  0.9 %  sodium chloride infusion, , Intravenous, Continuous, [place], Benay Pike, MD, Last Rate: 20 mL/hr at 01/13/21 1319, New Bag at 01/13/21 1319  Medications Prior to Admission  Medication Sig Dispense Refill Last Dose  . aspirin EC 81 MG tablet Take 81 mg by mouth daily.   Past Month at Unknown time  . Calcium Carbonate-Vitamin D 600-400 MG-UNIT tablet Take 1 tablet by mouth 2 (two) times daily.     Marland Kitchen DEXILANT 30 MG capsule Take 1 capsule by mouth daily.     Mariane Baumgarten Calcium (STOOL SOFTENER PO) Take 1 tablet by mouth daily.      Marland Kitchen estradiol (ESTRACE) 0.5 MG tablet Take 0.5 mg by mouth daily.     . fluticasone (FLONASE) 50 MCG/ACT nasal spray Place 2 sprays into both nostrils daily.      Marland Kitchen gabapentin (NEURONTIN) 300 MG capsule Take 300 mg by mouth at bedtime.      . metoCLOPramide (REGLAN) 5 MG tablet 1 tab in the morning, 1 tab at bedtime     . mirabegron ER (MYRBETRIQ) 25 MG TB24 tablet Take 1 tablet (25 mg total) by mouth daily. 90 tablet 3   . ondansetron (ZOFRAN-ODT) 4 MG disintegrating tablet Take 4 mg by mouth every 8 (eight) hours as needed.     Marland Kitchen oxyCODONE-acetaminophen  (PERCOCET/ROXICET) 5-325 MG tablet Take 1 tablet by mouth every 6 (six) hours as needed for severe pain. 10 tablet 0   . sucralfate (CARAFATE) 1 g tablet Take by mouth.     . sulfamethoxazole-trimethoprim (BACTRIM DS) 800-160 MG tablet Take 1 tablet by mouth every 12 (twelve) hours. 14 tablet 0   . tamsulosin (FLOMAX) 0.4 MG CAPS capsule Take 1 capsule (0.4 mg total) by mouth daily. 30 capsule 0   . topiramate (TOPAMAX) 50 MG tablet Take by mouth.     . vitamin C (ASCORBIC ACID) 500 MG tablet Take 1,000 mg by mouth 2 (two) times daily.        Allergies  Allergen Reactions  . Avelox [Moxifloxacin Hcl In Nacl] Other (See Comments)    Redness all over in about 5 minutes after taking  . Codeine Nausea Only    REACTION: nausea  . Acyclovir And Related   . Shrimp [Shellfish Allergy]     Hives    . Ciprofloxacin Rash  . Moxifloxacin Hives and Rash    Other reaction(s): OTHER  . Sucralfate Rash    Itching      Past Medical History:  Diagnosis Date  . Bursitis of left shoulder   . Chest pain, unspecified   . Chronic kidney disease    KIDNEY STONE CURRENTLY-08-2015-SEES DR COPE  . Complication of anesthesia    PT HAS SEVERE SCOLIOSIS AND FOR TKR  ANESTHESIA COULD NOT GET A SPINAL  . Coronary artery disease    ascvd  . CSF leak   . DDD (degenerative disc disease), cervical   . Diverticulosis   . Esophageal reflux   . Fibrocystic breast disease   . GERD (gastroesophageal reflux disease)   . Headache    H/O  . History of colon polyps   . History of hiatal hernia   . History of kidney stones   . IBS (irritable bowel syndrome)   . Nasal polyps   . Other and unspecified hyperlipidemia   . Scoliosis   . Tinnitus of both ears   . Trigeminal neuralgia syndrome   . Unspecified sinusitis (chronic)   . Vitamin B12 deficiency     Review of systems:  Otherwise negative.    Physical Exam  Gen: Alert, oriented. Appears stated age.  HEENT: [place]/AT. PERRLA. Lungs: CTA, no  wheezes. CV: RR nl S1, S2. Abd: soft, benign, no masses. BS+ Ext: No edema. Pulses 2+    Planned procedures: Proceed with colonoscopy. The patient understands the nature of the planned procedure, indications, risks, alternatives and potential complications including but not limited to bleeding, infection, perforation, damage to internal organs and possible oversedation/side effects from anesthesia. The patient agrees and gives consent to proceed.  Please refer to procedure notes for findings, recommendations and patient disposition/instructions.     Jocelyn Hooper K. Alice Reichert, M.D. Gastroenterology 01/13/2021  1:47 PM

## 2021-01-14 ENCOUNTER — Encounter: Payer: Self-pay | Admitting: Internal Medicine

## 2021-01-14 NOTE — Anesthesia Postprocedure Evaluation (Signed)
Anesthesia Post Note  Patient: Jocelyn Hooper  Procedure(s) Performed: ESOPHAGOGASTRODUODENOSCOPY (EGD) WITH PROPOFOL (N/A ) COLONOSCOPY WITH PROPOFOL (N/A )  Patient location during evaluation: Endoscopy Anesthesia Type: General Level of consciousness: awake and alert Pain management: pain level controlled Vital Signs Assessment: post-procedure vital signs reviewed and stable Respiratory status: spontaneous breathing, nonlabored ventilation, respiratory function stable and patient connected to nasal cannula oxygen Cardiovascular status: blood pressure returned to baseline and stable Postop Assessment: no apparent nausea or vomiting Anesthetic complications: no   No complications documented.   Last Vitals:  Vitals:   01/13/21 1440 01/13/21 1450  BP: 122/69 109/71  Pulse:    Resp:    Temp: 36.7 C   SpO2:      Last Pain:  Vitals:   01/13/21 1500  TempSrc:   PainSc: 0-No pain                 Precious Haws [name]

## 2021-01-16 LAB — SURGICAL PATHOLOGY

## 2021-01-23 NOTE — Progress Notes (Signed)
01/24/2021 4:31 PM   Jocelyn Hooper 1941/05/13 [id number]  Referring provider: Rusty Aus, MD [facility] [place],  [place] [address]  Chief Complaint  Patient presents with  . Nephrolithiasis   Urological history: 1. Urge incontinence - managed with Myrbetriq 25 mg daily - PVR 12 mL  2. Nephrolithiasis - last stone incident in 2014   3. rUTI's - last documented UTI 2019 - risk factors of age, post menopausal, constipation and incontinence   HPI: Jocelyn Hooper is a 80 y.o. female who is status post ESWL who presents today for follow up.  Underwent ESWL on 01/09/2021 for 5 mm right ureteral stone with Dr. Erlene Quan.  Their postprocedural course was as expected and uneventful.   They have passed fragments.   They bring in fragments for analysis.   KUB right distal stone no longer visualized   UA negative for micro heme   PMH: Past Medical History:  Diagnosis Date  . Bursitis of left shoulder   . Chest pain, unspecified   . Chronic kidney disease    KIDNEY STONE CURRENTLY-08-2015-SEES DR COPE  . Complication of anesthesia    PT HAS SEVERE SCOLIOSIS AND FOR TKR ANESTHESIA COULD NOT GET A SPINAL  . Coronary artery disease    ascvd  . CSF leak   . DDD (degenerative disc disease), cervical   . Diverticulosis   . Esophageal reflux   . Fibrocystic breast disease   . GERD (gastroesophageal reflux disease)   . Headache    H/O  . History of colon polyps   . History of hiatal hernia   . History of kidney stones   . IBS (irritable bowel syndrome)   . Nasal polyps   . Other and unspecified hyperlipidemia   . Scoliosis   . Tinnitus of both ears   . Trigeminal neuralgia syndrome   . Unspecified sinusitis (chronic)   . Vitamin B12 deficiency     Surgical History: Past Surgical History:  Procedure Laterality Date  . ABDOMINAL HYSTERECTOMY    . APPENDECTOMY    . BACK SURGERY  1982   disc  . [place]     left   . CATARACT EXTRACTION     right  . CHOLECYSTECTOMY  2002  . COLONOSCOPY WITH PROPOFOL N/A 03/10/2017   Procedure: COLONOSCOPY WITH PROPOFOL;  Surgeon: Manya Silvas, MD;  Location: [facility] ENDOSCOPY;  Service: Endoscopy;  Laterality: N/A;  . COLONOSCOPY WITH PROPOFOL N/A 01/13/2021   Procedure: COLONOSCOPY WITH PROPOFOL;  Surgeon: Toledo, Benay Pike, MD;  Location: [facility] ENDOSCOPY;  Service: Gastroenterology;  Laterality: N/A;  . ESOPHAGOGASTRODUODENOSCOPY (EGD) WITH PROPOFOL N/A 01/13/2021   Procedure: ESOPHAGOGASTRODUODENOSCOPY (EGD) WITH PROPOFOL;  Surgeon: Toledo, Benay Pike, MD;  Location: [facility] ENDOSCOPY;  Service: Gastroenterology;  Laterality: N/A;  . EXTRACORPOREAL SHOCK WAVE LITHOTRIPSY Right 01/09/2021   Procedure: EXTRACORPOREAL SHOCK WAVE LITHOTRIPSY (ESWL);  Surgeon: Hollice Espy, MD;  Location: [facility] ORS;  Service: Urology;  Laterality: Right;  . EYE SURGERY Bilateral    Cataract Extraction with IOL  . hysterectomy (other)  1974  . JOINT REPLACEMENT     LEFT knee  . KIDNEY STONE SURGERY    . KNEE ARTHROPLASTY Right 09/27/2017   Procedure: COMPUTER ASSISTED TOTAL KNEE ARTHROPLASTY;  Surgeon: Dereck Leep, MD;  Location: [facility] ORS;  Service: Orthopedics;  Laterality: Right;  . KNEE ARTHROSCOPY Right 09/13/2015   Procedure:  ARTHROSCOPY KNEE, PARTIAL LATERAL MENISECTOMY, CHONDROPLASTY ;  Surgeon: Dereck Leep, MD;  Location: [facility] ORS;  Service: Orthopedics;  Laterality: Right;  . KNEE SURGERY  2004   having a lot of problems with left knee  . [place], 2003, 2012  . sinus procedures  1970,1990,1994,2005  . TONSILLECTOMY      Home Medications:  Allergies as of 01/24/2021      Reactions   Avelox [moxifloxacin Hcl In Nacl] Other (See Comments)   Redness all over in about 5 minutes after taking   Codeine Nausea Only   REACTION: nausea   Acyclovir And Related    Shrimp [shellfish Allergy]    Hives     Ciprofloxacin Rash   Moxifloxacin Hives, Rash   Other reaction(s): OTHER   Sucralfate Rash   Itching       Medication List       Accurate as of Jan 24, 2021 11:59 PM. If you have any questions, ask your nurse or doctor.        STOP taking these medications   oxyCODONE-acetaminophen 5-325 MG tablet Commonly known as: PERCOCET/ROXICET Stopped by: Mardelle Pandolfi, PA-C   sucralfate 1 g tablet Commonly known as: CARAFATE Stopped by: Lamonda Noxon, PA-C   sulfamethoxazole-trimethoprim 800-160 MG tablet Commonly known as: BACTRIM DS Stopped by: Heavyn Yearsley, PA-C   tamsulosin 0.4 MG Caps capsule Commonly known as: FLOMAX Stopped by: Zara Council, PA-C     TAKE these medications   aspirin EC 81 MG tablet Take 81 mg by mouth daily.   Calcium Carbonate-Vitamin D 600-400 MG-UNIT tablet Take 1 tablet by mouth 2 (two) times daily.   Dexilant 30 MG capsule Generic drug: Dexlansoprazole Take 1 capsule by mouth daily.   estradiol 0.5 MG tablet Commonly known as: ESTRACE Take 0.5 mg by mouth daily.   fluticasone 50 MCG/ACT nasal spray Commonly known as: FLONASE Place 2 sprays into both nostrils daily.   gabapentin 300 MG capsule Commonly known as: NEURONTIN Take 300 mg by mouth at bedtime.   metoCLOPramide 5 MG tablet Commonly known as: REGLAN 1 tab in the morning, 1 tab at bedtime   mirabegron ER 25 MG Tb24 tablet Commonly known as: MYRBETRIQ Take 1 tablet (25 mg total) by mouth daily.   ondansetron 4 MG disintegrating tablet Commonly known as: ZOFRAN-ODT Take 4 mg by mouth every 8 (eight) hours as needed.   STOOL SOFTENER PO Take 1 tablet by mouth daily.   topiramate 50 MG tablet Commonly known as: TOPAMAX Take by mouth.   vitamin C 500 MG tablet Commonly known as: ASCORBIC ACID Take 1,000 mg by mouth 2 (two) times daily.       Allergies:  Allergies  Allergen Reactions  . Avelox [Moxifloxacin Hcl In Nacl] Other (See Comments)     Redness all over in about 5 minutes after taking  . Codeine Nausea Only    REACTION: nausea  . Acyclovir And Related   . Shrimp [Shellfish Allergy]     Hives    . Ciprofloxacin Rash  . Moxifloxacin Hives and Rash    Other reaction(s): OTHER  . Sucralfate Rash    Itching     Family History: Family History  Problem Relation Age of Onset  . Heart attack Mother 56  . Heart failure Mother   . Heart attack Father   . Heart disease Father        CABG  . Heart attack Brother 59  premature  . Breast cancer Sister 66    Social History:  reports that she has never smoked. She has never used smokeless tobacco. She reports that she does not drink alcohol and does not use drugs.  ROS: Pertinent ROS in HPI  Physical Exam: BP 102/69   Pulse 67   Ht 5\' 5"  (1.651 m)   Wt 131 lb (59.4 kg)   BMI 21.80 kg/m   Constitutional:  Well nourished. Alert and oriented, No acute distress. HEENT: [place] AT, mask in place.  Trachea midline Cardiovascular: No clubbing, cyanosis, or edema. Respiratory: Normal respiratory effort, no increased work of breathing. Neurologic: Grossly intact, no focal deficits, moving all 4 extremities. Psychiatric: Normal mood and affect.   Laboratory Data: Component     Latest Ref Rng & Units 01/24/2021  Specific Gravity, UA     1.005 - 1.030 1.020  pH, UA     5.0 - 7.5 7.0  Color, UA     Yellow Yellow  Appearance Ur     Clear Cloudy (A)  Leukocytes,UA     Negative Trace (A)  Protein,UA     Negative/Trace Negative  Glucose, UA     Negative Negative  Ketones, UA     Negative Negative  RBC, UA     Negative Negative  Bilirubin, UA     Negative Negative  Urobilinogen, Ur     0.2 - 1.0 mg/dL 0.2  Nitrite, UA     Negative Negative  Microscopic Examination      See below:   Component     Latest Ref Rng & Units 01/24/2021  WBC, UA     0 - 5 /hpf 0-5  RBC     0 - 2 /hpf None seen  Epithelial Cells (non renal)     0 - 10 /hpf 0-10  Crystals      N/A Present (A)  Crystal Type     N/A Amorphous Sediment  Bacteria, UA     None seen/Few None seen  I have reviewed the labs.   Pertinent Imaging: CLINICAL DATA:  80 year old female with kidney stone.  EXAM: ABDOMEN - 1 VIEW  COMPARISON:  Abdominal radiograph dated 01/09/2021.  FINDINGS: There is moderate amount of stool throughout the colon. No bowel dilatation or evidence of obstruction. The previously seen radiopaque focus in the right pelvis is not visualized on today's exam. Similar appearance of a 3 mm right renal calculus. Two radiopaque foci over the left flank appears similar to prior radiograph and may represent kidney stones. There is osteopenia with scoliosis and degenerative changes of the spine. No acute osseous pathology.  IMPRESSION: 1. The previously seen radiopaque focus in the right pelvis is not visualized on today's exam. 2. Similar appearance of the 3 mm right renal calculus and radiopaque foci over the left flank.   Electronically Signed   By: Anner Crete M.D.   On: 01/25/2021 19:40 I have independently reviewed the films.  See HPI.    Assessment & Plan:    1. Right ureteral stone -no longer visible on KUB -stone send for analysis  Return in about 1 year (around 01/24/2022) for PVR, KUB and OAB questionnaire.  These notes generated with voice recognition software. I apologize for typographical errors.  Zara Council, PA-C  [facility] [address]  [place], [place] [address] [phone]

## 2021-01-24 ENCOUNTER — Other Ambulatory Visit: Payer: Self-pay

## 2021-01-24 ENCOUNTER — Encounter: Payer: Self-pay | Admitting: Urology

## 2021-01-24 ENCOUNTER — Ambulatory Visit (INDEPENDENT_AMBULATORY_CARE_PROVIDER_SITE_OTHER): Payer: PPO | Admitting: Urology

## 2021-01-24 ENCOUNTER — Ambulatory Visit
Admission: RE | Admit: 2021-01-24 | Discharge: 2021-01-24 | Disposition: A | Discharge status: Home / Self Care | Payer: PPO | Attending: Urology | Admitting: Urology

## 2021-01-24 ENCOUNTER — Other Ambulatory Visit: Payer: Self-pay | Admitting: *Deleted

## 2021-01-24 ENCOUNTER — Ambulatory Visit
Admission: RE | Admit: 2021-01-24 | Discharge: 2021-01-24 | Disposition: A | Discharge status: Home / Self Care | Payer: PPO | Source: Ambulatory Visit | Attending: Urology | Admitting: Urology

## 2021-01-24 VITALS — BP 102/69 | HR 67 | Ht 65.0 in | Wt 131.0 lb

## 2021-01-24 DIAGNOSIS — D2271 Melanocytic nevi of right lower limb, including hip: Secondary | ICD-10-CM | POA: Diagnosis not present

## 2021-01-24 DIAGNOSIS — N201 Calculus of ureter: Secondary | ICD-10-CM

## 2021-01-24 DIAGNOSIS — D0471 Carcinoma in situ of skin of right lower limb, including hip: Secondary | ICD-10-CM | POA: Diagnosis not present

## 2021-01-24 DIAGNOSIS — D485 Neoplasm of uncertain behavior of skin: Secondary | ICD-10-CM | POA: Diagnosis not present

## 2021-01-24 DIAGNOSIS — D2262 Melanocytic nevi of left upper limb, including shoulder: Secondary | ICD-10-CM | POA: Diagnosis not present

## 2021-01-24 DIAGNOSIS — D0439 Carcinoma in situ of skin of other parts of face: Secondary | ICD-10-CM | POA: Diagnosis not present

## 2021-01-24 DIAGNOSIS — D2261 Melanocytic nevi of right upper limb, including shoulder: Secondary | ICD-10-CM | POA: Diagnosis not present

## 2021-01-24 DIAGNOSIS — D2272 Melanocytic nevi of left lower limb, including hip: Secondary | ICD-10-CM | POA: Diagnosis not present

## 2021-01-24 DIAGNOSIS — D225 Melanocytic nevi of trunk: Secondary | ICD-10-CM | POA: Diagnosis not present

## 2021-01-24 DIAGNOSIS — L821 Other seborrheic keratosis: Secondary | ICD-10-CM | POA: Diagnosis not present

## 2021-01-24 DIAGNOSIS — N200 Calculus of kidney: Secondary | ICD-10-CM | POA: Diagnosis not present

## 2021-01-24 DIAGNOSIS — M419 Scoliosis, unspecified: Secondary | ICD-10-CM | POA: Diagnosis not present

## 2021-01-24 DIAGNOSIS — D0461 Carcinoma in situ of skin of right upper limb, including shoulder: Secondary | ICD-10-CM | POA: Diagnosis not present

## 2021-01-24 LAB — URINALYSIS, COMPLETE
Bilirubin, UA: NEGATIVE
Glucose, UA: NEGATIVE
Ketones, UA: NEGATIVE
Nitrite, UA: NEGATIVE
Protein,UA: NEGATIVE
RBC, UA: NEGATIVE
Specific Gravity, UA: 1.02 (ref 1.005–1.030)
Urobilinogen, Ur: 0.2 mg/dL (ref 0.2–1.0)
pH, UA: 7 (ref 5.0–7.5)

## 2021-01-24 LAB — MICROSCOPIC EXAMINATION
Bacteria, UA: NONE SEEN
RBC, Urine: NONE SEEN /hpf (ref 0–2)

## 2021-02-01 LAB — CALCULI, WITH PHOTOGRAPH (CLINICAL LAB)
Calcium Oxalate Dihydrate: 40 %
Calcium Oxalate Monohydrate: 40 %
Hydroxyapatite: 20 %
Weight Calculi: 18 mg

## 2021-02-19 DIAGNOSIS — E538 Deficiency of other specified B group vitamins: Secondary | ICD-10-CM | POA: Diagnosis not present

## 2021-02-19 DIAGNOSIS — I5041 Acute combined systolic (congestive) and diastolic (congestive) heart failure: Secondary | ICD-10-CM | POA: Diagnosis not present

## 2021-02-19 DIAGNOSIS — I38 Endocarditis, valve unspecified: Secondary | ICD-10-CM | POA: Diagnosis not present

## 2021-02-19 DIAGNOSIS — E782 Mixed hyperlipidemia: Secondary | ICD-10-CM | POA: Diagnosis not present

## 2021-02-26 DIAGNOSIS — C44722 Squamous cell carcinoma of skin of right lower limb, including hip: Secondary | ICD-10-CM | POA: Diagnosis not present

## 2021-02-26 DIAGNOSIS — D485 Neoplasm of uncertain behavior of skin: Secondary | ICD-10-CM | POA: Diagnosis not present

## 2021-02-26 DIAGNOSIS — C44729 Squamous cell carcinoma of skin of left lower limb, including hip: Secondary | ICD-10-CM | POA: Diagnosis not present

## 2021-02-26 DIAGNOSIS — L0101 Non-bullous impetigo: Secondary | ICD-10-CM | POA: Diagnosis not present

## 2021-03-07 IMAGING — US ULTRASOUND ABDOMEN LIMITED
1 series · 14 of 25 positions shown · non-contrast
Comparison: None.

CLINICAL DATA: Nausea and vomiting.  Status post cholecystectomy.

EXAM:
ULTRASOUND ABDOMEN LIMITED RIGHT UPPER QUADRANT

[Series 1: ultrasound abdomen limited · 0.21mm/px · 14 of 32 slices shown]
[im 1/32]
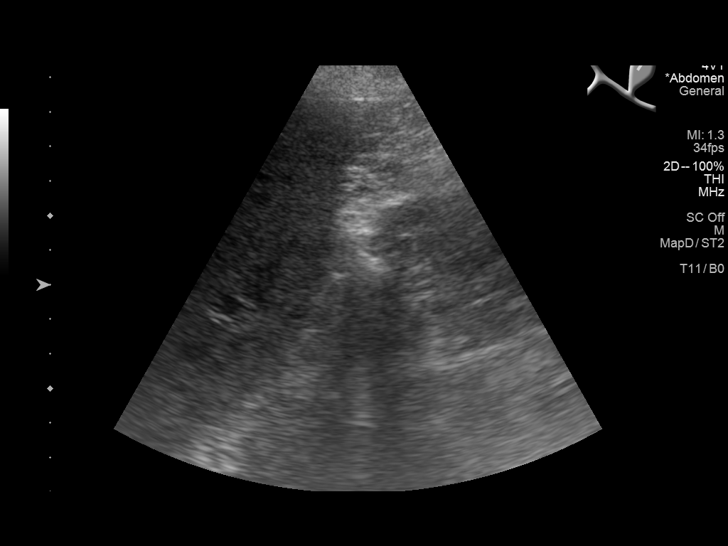
[im 3/32]
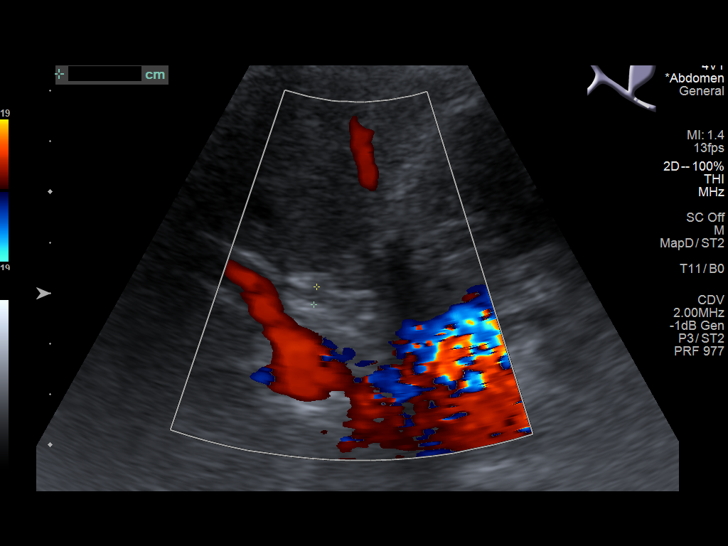
[im 6/32]
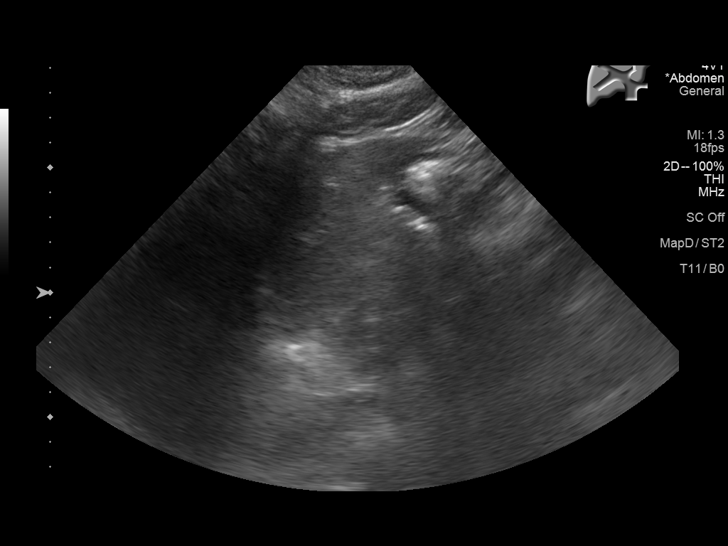
[im 8/32]
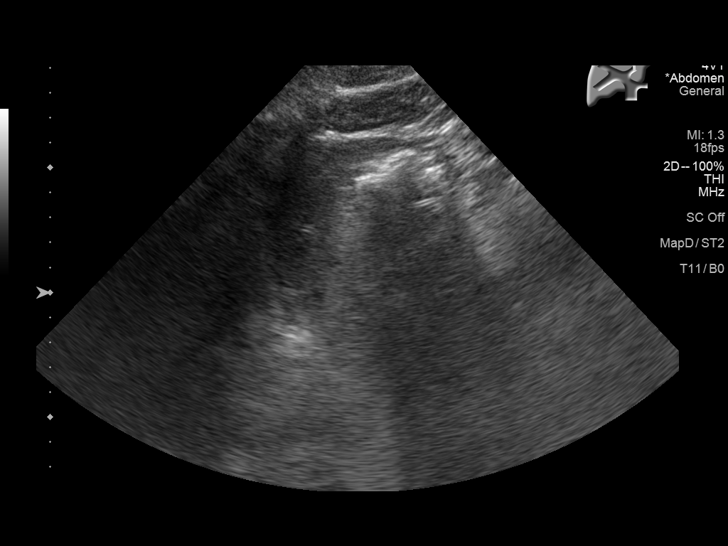
[im 11/32]
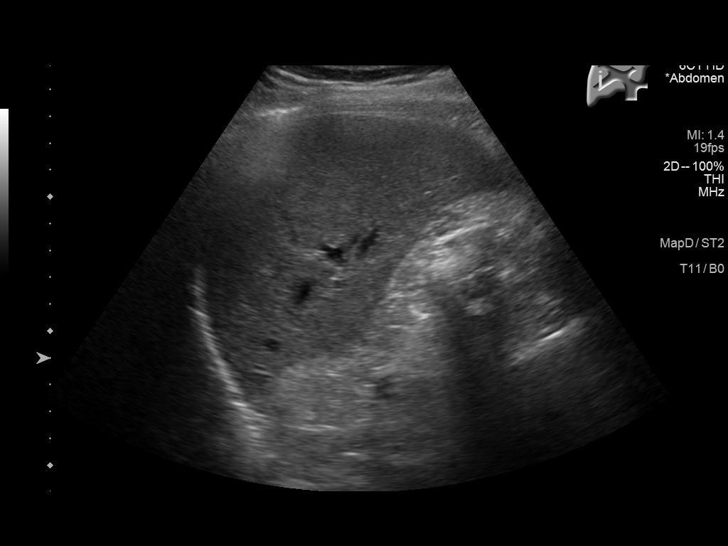
[im 12/32]
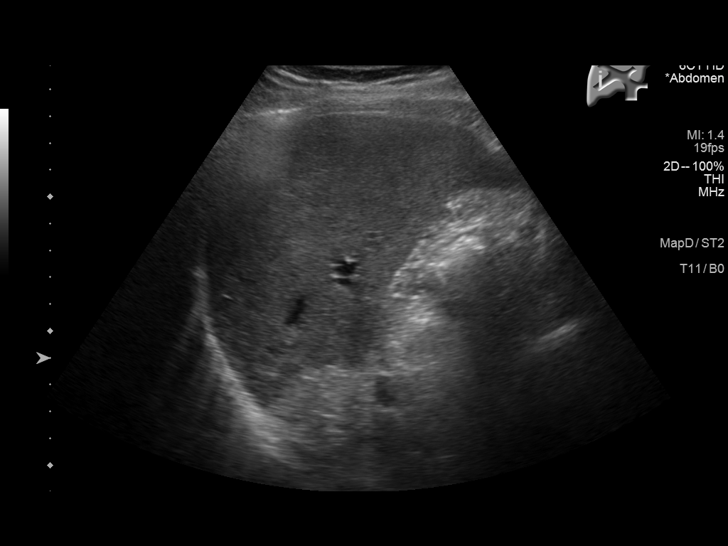
[im 15/32]
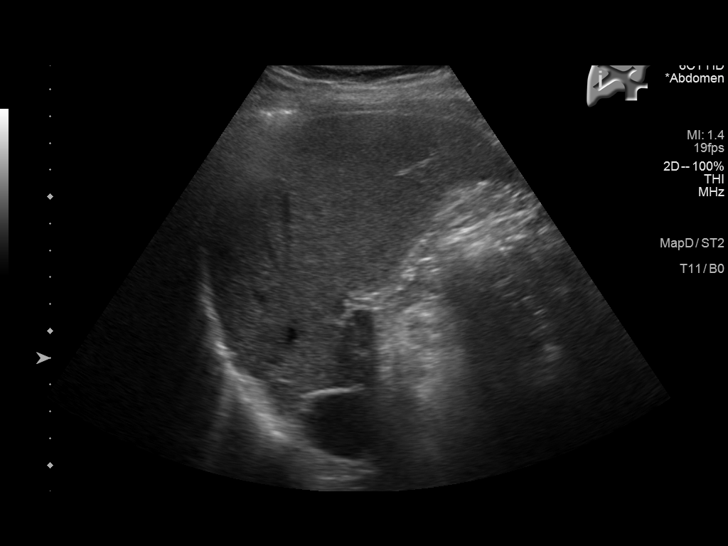
[im 17/32]
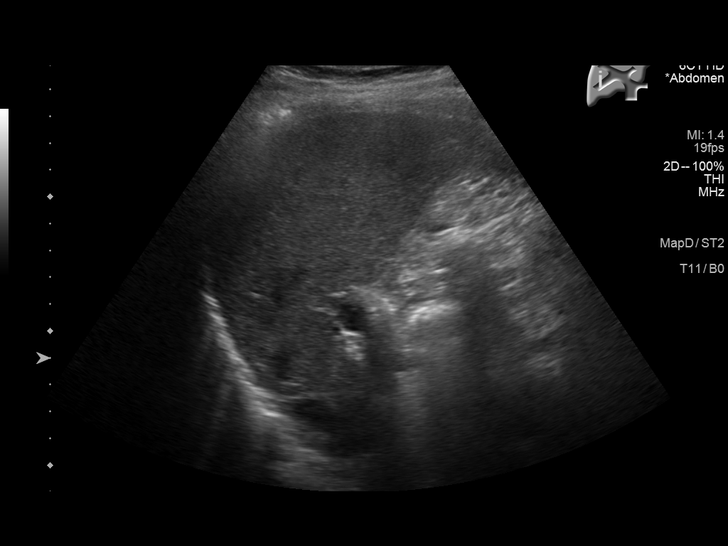
[im 20/32]
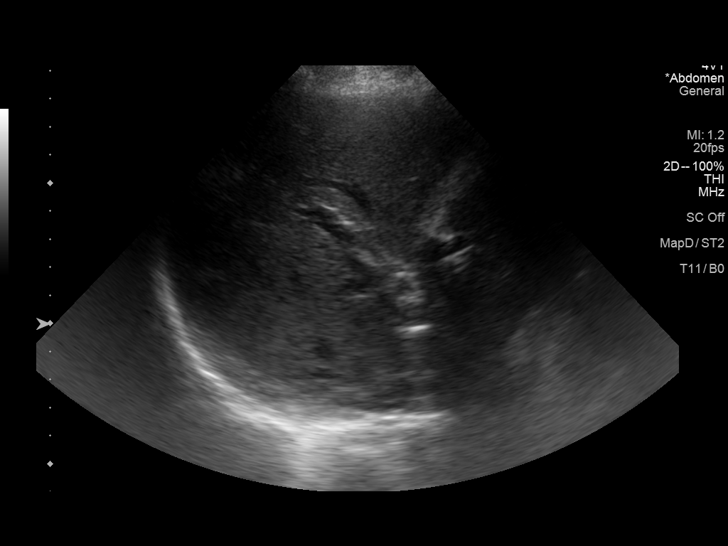
[im 21/32]
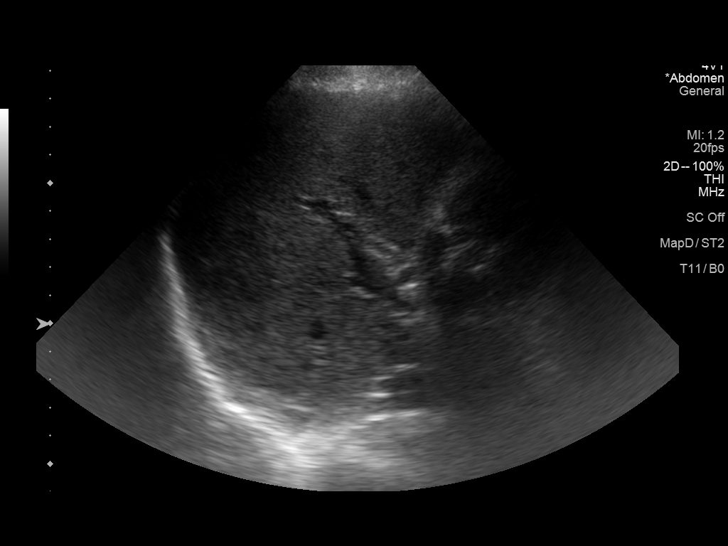
[im 24/32]
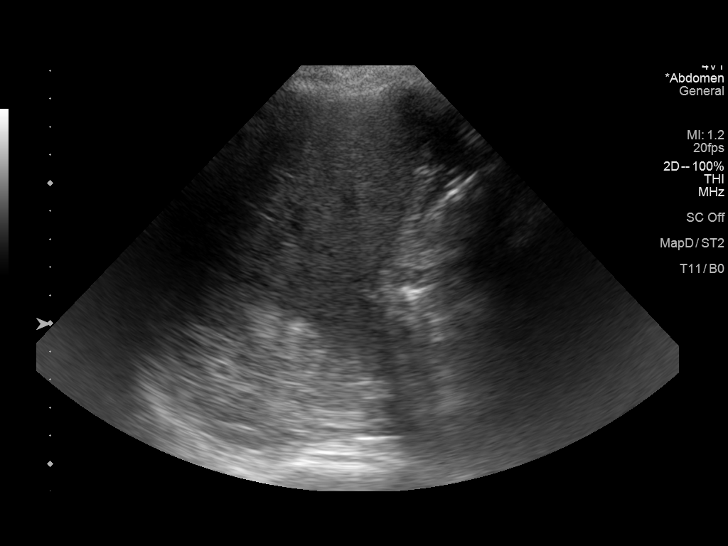
[im 26/32]
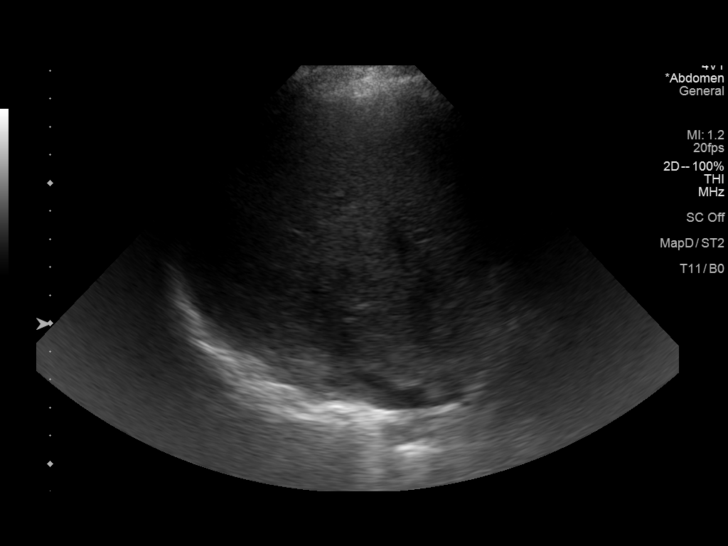
[im 29/32]
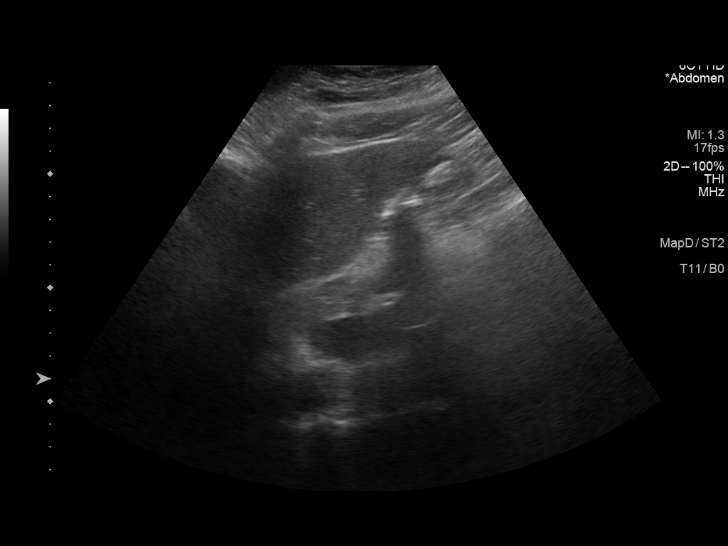
[im 32/32]
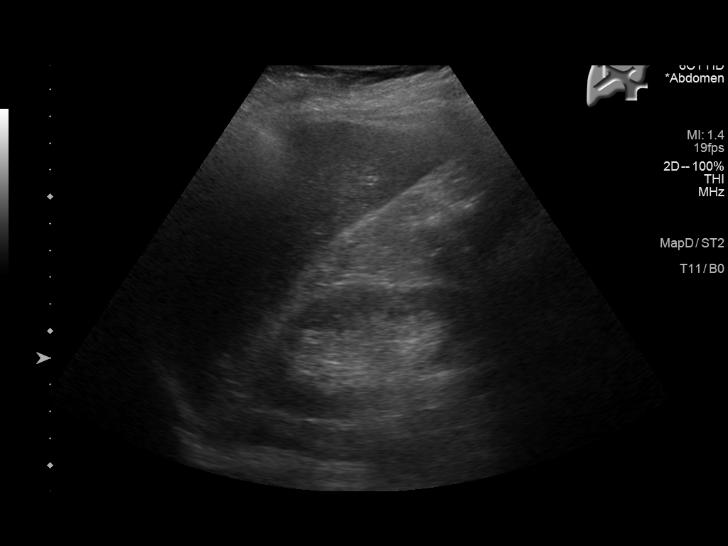

[14 of 25 positions shown; findings below may reference images not displayed]

FINDINGS: Gallbladder:

Surgically absent

Common bile duct:

Diameter: 0.3 cm

Liver:

No focal lesion identified. Within normal limits in parenchymal
echogenicity. Portal vein is patent on color Doppler imaging with
normal direction of blood flow towards the liver.
IMPRESSION: Status post cholecystectomy.  No acute sonographic abnormality.

## 2021-03-13 DIAGNOSIS — B349 Viral infection, unspecified: Secondary | ICD-10-CM | POA: Diagnosis not present

## 2021-03-13 DIAGNOSIS — R509 Fever, unspecified: Secondary | ICD-10-CM | POA: Diagnosis not present

## 2021-03-13 DIAGNOSIS — N3090 Cystitis, unspecified without hematuria: Secondary | ICD-10-CM | POA: Diagnosis not present

## 2021-03-13 DIAGNOSIS — C44729 Squamous cell carcinoma of skin of left lower limb, including hip: Secondary | ICD-10-CM | POA: Diagnosis not present

## 2021-03-13 DIAGNOSIS — S81801A Unspecified open wound, right lower leg, initial encounter: Secondary | ICD-10-CM | POA: Diagnosis not present

## 2021-03-13 DIAGNOSIS — D485 Neoplasm of uncertain behavior of skin: Secondary | ICD-10-CM | POA: Diagnosis not present

## 2021-03-13 DIAGNOSIS — D0461 Carcinoma in situ of skin of right upper limb, including shoulder: Secondary | ICD-10-CM | POA: Diagnosis not present

## 2021-03-28 IMAGING — RF UPPER GI SERIES W/HIGH DENSITY WITHOUT KUB
14 of 24 series · 14 of 24 positions shown · non-contrast
Comparison: None.

CLINICAL DATA: Chronic nausea for the past 6 weeks

EXAM:
UPPER GI SERIES WITHOUT KUB
TECHNIQUE: Routine upper GI series was performed with thin/high density/water
soluble barium.
FLUOROSCOPY TIME:  Fluoroscopy Time:  0.4 minute
Radiation Exposure Index (if provided by the fluoroscopic device):
1.5 mGy
Number of Acquired Spot Images: 0

[Series 1: cp_standard · 0.26mm/px · 1 of 1 slices shown (1 of 14)]
[im 1/1]
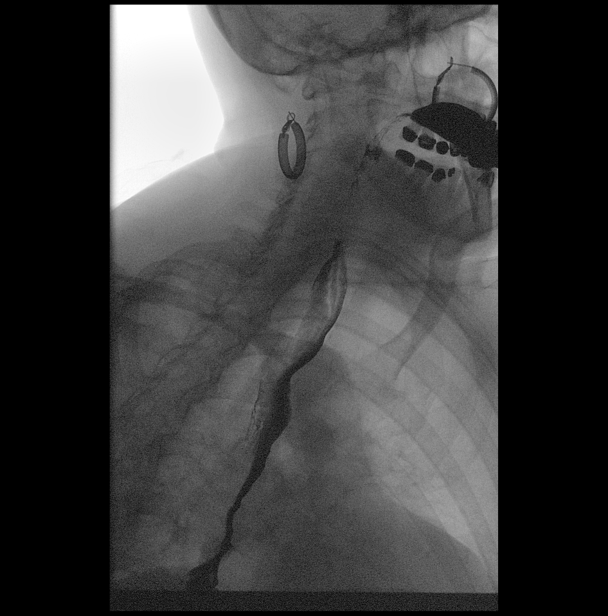

[Series 3: cp_standard · 0.26mm/px · 1 of 1 slices shown (2 of 14)]
[im 1/1]
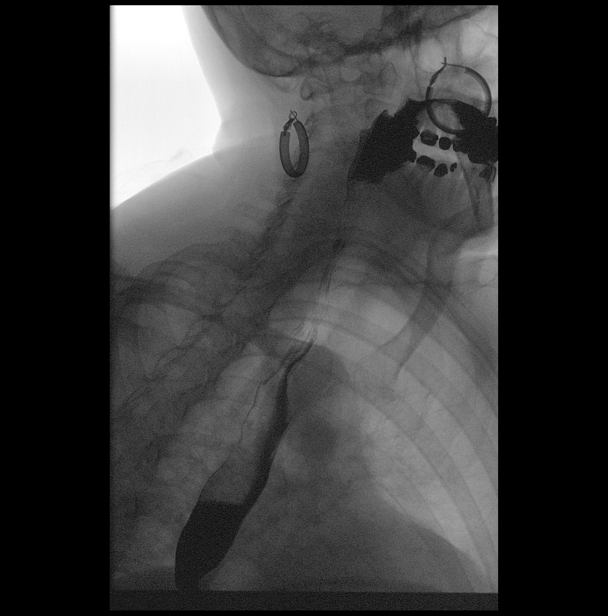

[Series 5: cp_standard · 0.26mm/px · 1 of 1 slices shown (3 of 14)]
[im 1/1]
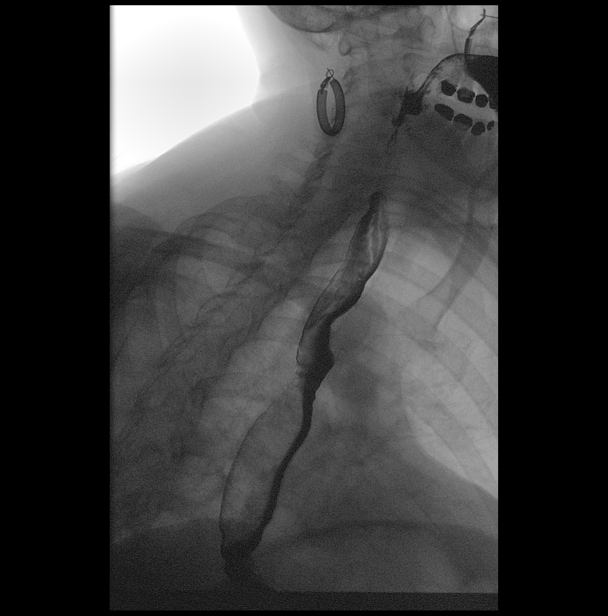

[Series 7: cp_standard · 0.26mm/px · 1 of 1 slices shown (4 of 14)]
[im 1/1]
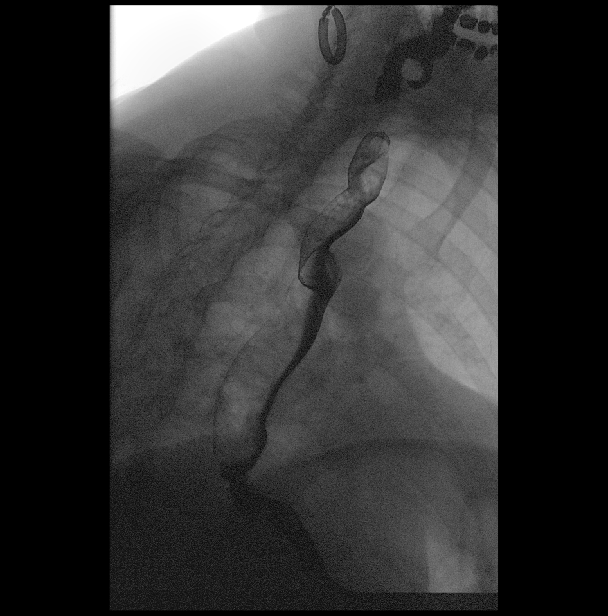

[Series 8: cp_standard · 0.26mm/px · 1 of 1 slices shown (5 of 14)]
[im 1/1]
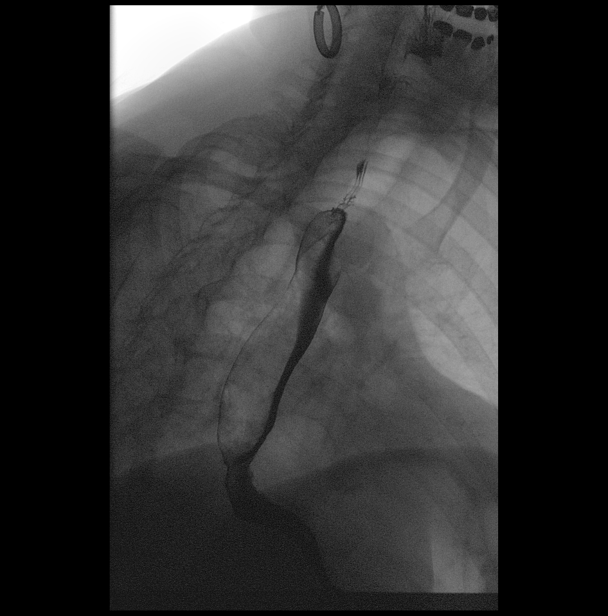

[Series 10: cp_standard · 0.26mm/px · 1 of 1 slices shown (6 of 14)]
[im 1/1]
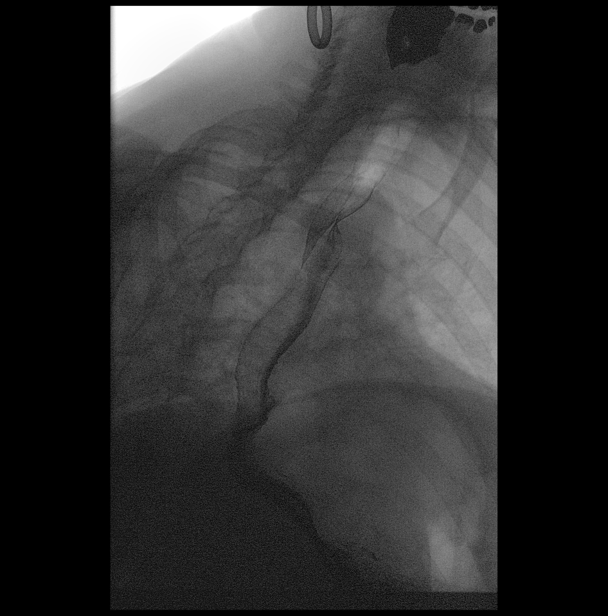

[Series 12: cp_standard · 0.26mm/px · 1 of 1 slices shown (7 of 14)]
[im 1/1]
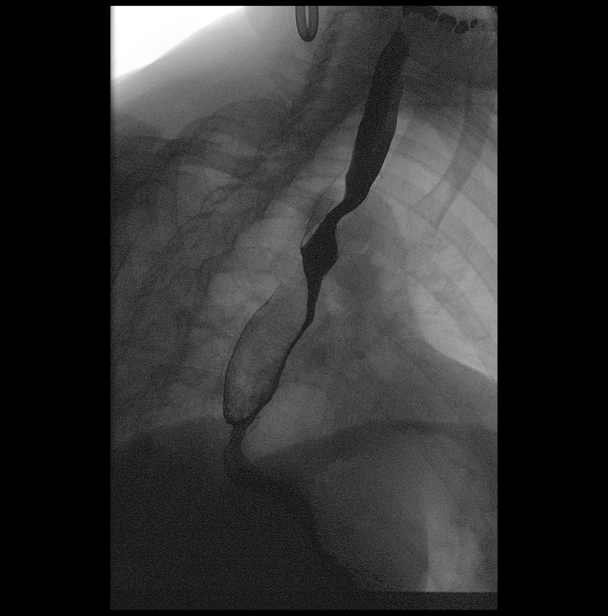

[Series 13: cp_standard · 0.26mm/px · 1 of 1 slices shown (8 of 14)]
[im 1/1]
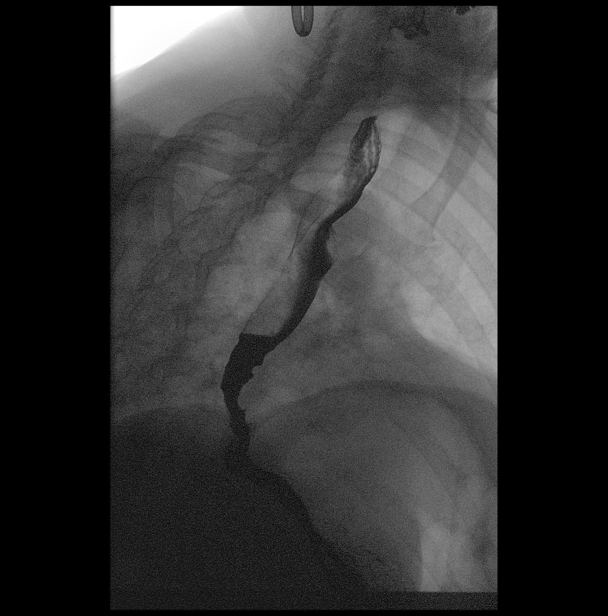

[Series 15: cp_standard · 0.28mm/px · 1 of 1 slices shown (9 of 14)]
[im 1/1]
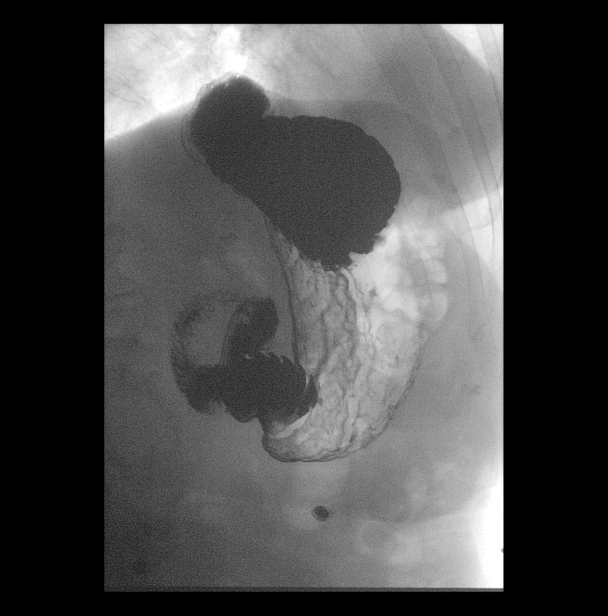

[Series 17: cp_standard · 0.29mm/px · 1 of 1 slices shown (10 of 14)]
[im 1/1]
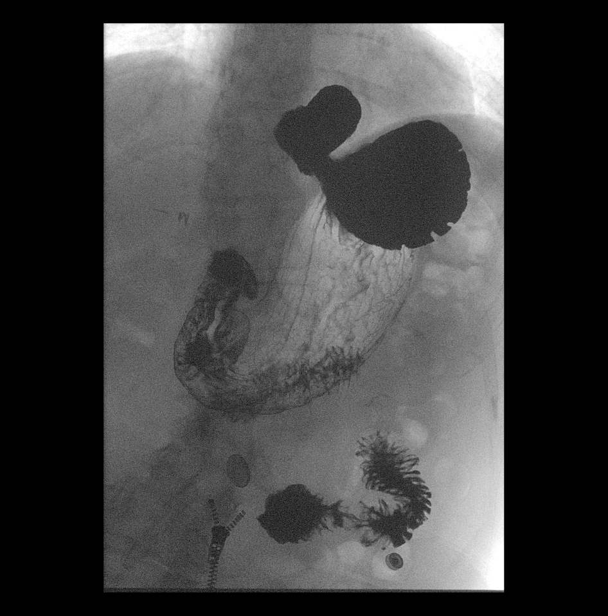

[Series 19: cp_standard · 0.29mm/px · 1 of 1 slices shown (11 of 14)]
[im 1/1]
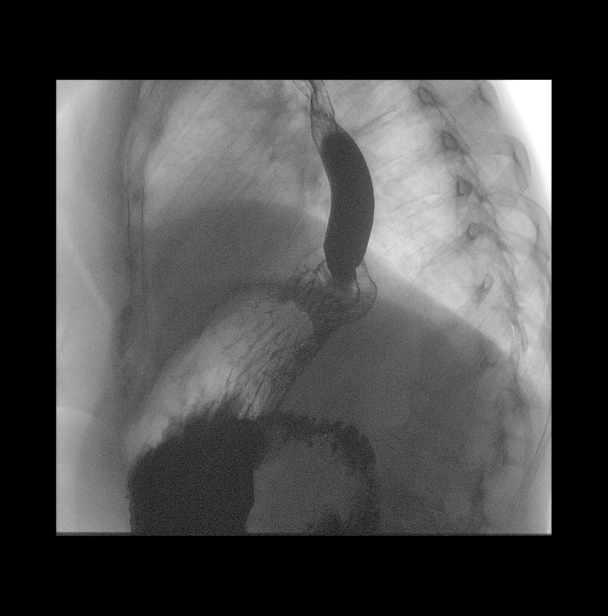

[Series 20: cp_standard · 0.29mm/px · 1 of 1 slices shown (12 of 14)]
[im 1/1]
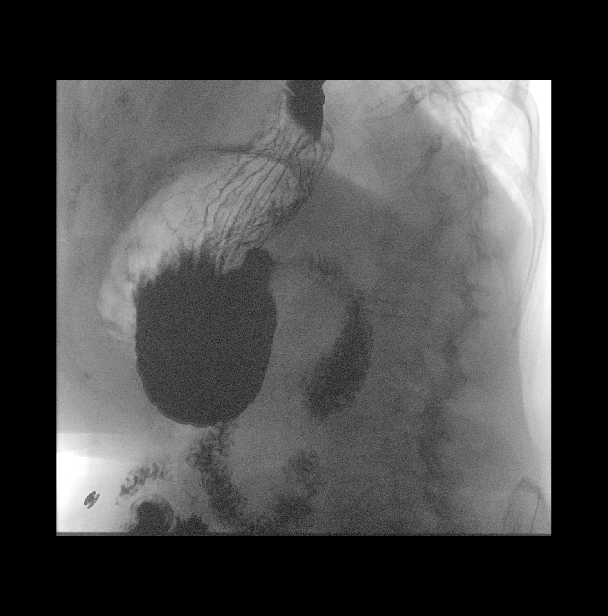

[Series 22: cp_standard · 0.28mm/px · 1 of 1 slices shown (13 of 14)]
[im 1/1]
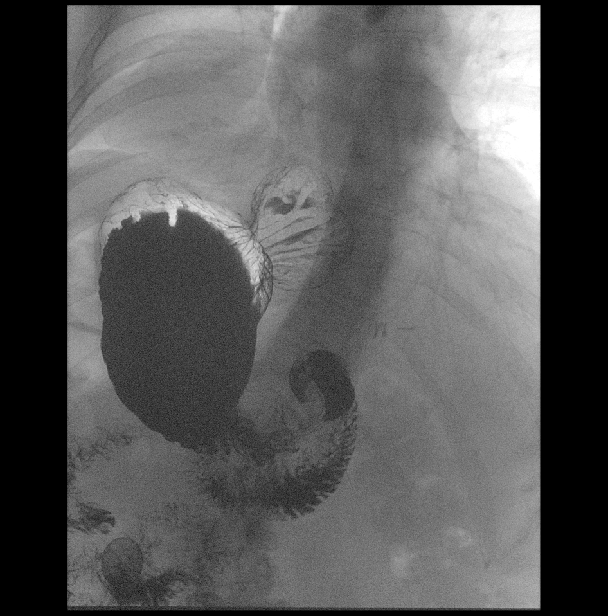

[Series 24: cp_standard · 0.28mm/px · 1 of 1 slices shown (14 of 14)]
[im 1/1]
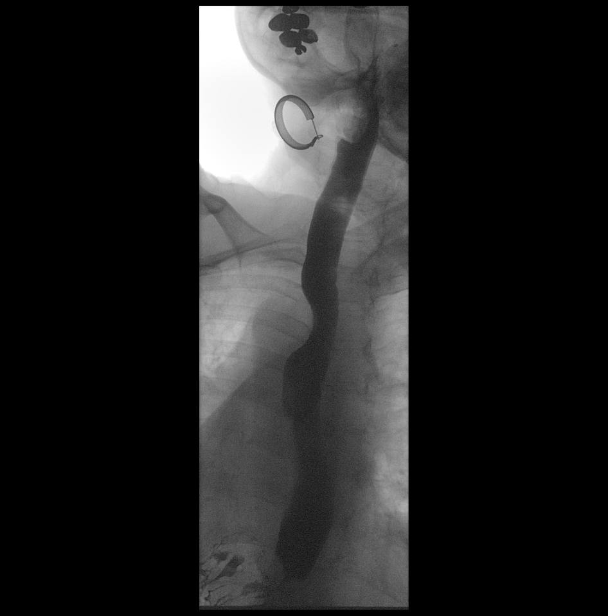

[14 of 24 positions shown; findings below may reference images not displayed]

FINDINGS: Examination of the esophagus demonstrated normal esophageal
motility. Normal esophageal morphology without evidence of
esophagitis or ulceration. No esophageal diverticula or mass lesion.
Slight relative narrowing of the distal esophagus just proximal to
the esophagogastric junction, but does not restrict the passage of a
13 mm barium tablet. Moderate-sized hiatal hernia. Moderate severity
gastroesophageal reflux.

Examination of the stomach demonstrated normal rugal folds and areae
gastricae. The gastric mucosa appeared unremarkable without evidence
of ulceration, scarring, or mass lesion. Gastric motility and
emptying was normal. Fluoroscopic examination of the duodenum
demonstrates normal motility and morphology without evidence of
ulceration or mass lesion.

At the end of the examination a 13 mm barium tablet was administered
which transited through the esophagus and esophagogastric junction
without delay.
IMPRESSION: 1. Moderate size hiatal hernia.
2. Moderate severity gastroesophageal reflux.
3. Otherwise normal upper GI.

## 2021-04-30 DIAGNOSIS — C44729 Squamous cell carcinoma of skin of left lower limb, including hip: Secondary | ICD-10-CM | POA: Diagnosis not present

## 2021-05-21 ENCOUNTER — Ambulatory Visit: Payer: PPO | Admitting: Podiatry

## 2021-05-21 ENCOUNTER — Encounter: Payer: Self-pay | Admitting: Podiatry

## 2021-05-21 ENCOUNTER — Other Ambulatory Visit: Payer: Self-pay

## 2021-05-21 DIAGNOSIS — L600 Ingrowing nail: Secondary | ICD-10-CM

## 2021-05-21 MED ORDER — NEOMYCIN-POLYMYXIN-HC 1 % OT SOLN: OTIC | 1 refills | Status: DC

## 2021-05-21 NOTE — Patient Instructions (Signed)

## 2021-05-21 NOTE — Progress Notes (Signed)
She presents today after having not seen her for a couple of years with a chief complaint of ingrown toenail tibial border the hallux bilateral.  States that it feels like the toenail is growing into the meat she says I tried to cut them out and I like to have them cut out again.  Objective: Vital signs stable alert oriented x3 there is no erythema edema cellulitis drainage or odor.  Sharp incurvated nail margin tender on palpation.  Pulses remain palpable.  Assessment: Ingrown nail paronychia abscess hallux bilateral.  Plan: Tibial border matricectomy was performed after local anesthetic was administered she tolerated procedure well without complications.  I will follow-up with her in the near future for evaluation.  She was provided with both oral written home-going instruction with care and soaking of the foot as well as a prescription for Cortisporin Otic to be applied twice daily after soaking.

## 2021-05-22 DIAGNOSIS — C44729 Squamous cell carcinoma of skin of left lower limb, including hip: Secondary | ICD-10-CM | POA: Diagnosis not present

## 2021-06-11 ENCOUNTER — Ambulatory Visit: Payer: PPO | Admitting: Podiatry

## 2021-06-11 ENCOUNTER — Other Ambulatory Visit: Payer: Self-pay

## 2021-06-11 ENCOUNTER — Encounter: Payer: Self-pay | Admitting: Podiatry

## 2021-06-11 DIAGNOSIS — L600 Ingrowing nail: Secondary | ICD-10-CM

## 2021-06-11 DIAGNOSIS — Z9889 Other specified postprocedural states: Secondary | ICD-10-CM | POA: Diagnosis not present

## 2021-06-11 DIAGNOSIS — L03031 Cellulitis of right toe: Secondary | ICD-10-CM

## 2021-06-11 MED ORDER — DOXYCYCLINE HYCLATE 100 MG PO TABS: 100.0000 mg | ORAL_TABLET | Freq: Two times a day (BID) | ORAL | 0 refills | Status: DC

## 2021-06-11 NOTE — Progress Notes (Signed)
She presents today and states that the ingrown procedures were doing pretty good until I pulled the scab off of this).  She states now it is sore and more red.  States that she stopped soaking the other week.  Objective: Vital signs are stable she is alert and oriented x3 hallux right tibial border does demonstrate mild erythema no cellulitis drainage or odor.  Her left foot is healing very nicely with no signs of infection.  Assessment: Mild paronychia status post matrixectomy hallux right well-healing matrixectomy hallux left.  Plan: At this point I started her on doxycycline and recommended she continue to soak Epson salts and warm water.

## 2021-06-18 ENCOUNTER — Other Ambulatory Visit: Payer: Self-pay | Admitting: Podiatry

## 2021-06-18 ENCOUNTER — Telehealth: Payer: Self-pay | Admitting: *Deleted

## 2021-06-18 MED ORDER — AMOXICILLIN-POT CLAVULANATE 875-125 MG PO TABS: 1.0000 | ORAL_TABLET | Freq: Two times a day (BID) | ORAL | 1 refills | Status: DC

## 2021-06-18 NOTE — Telephone Encounter (Signed)
"  I called yesterday morning.  No one has got back with me.  It's been over 24 hours.  I saw Dr. Milinda Pointer last week.  He gave me a prescription for Doxycycline.  I had to stop taking it because it was making me throw up.  I need another prescription.  That Doxycycline does not work.  I can take Amoxicillin, it doesn't bother me.  I need you to get back with me, now!  I need some medicine! Thank you."

## 2021-06-18 NOTE — Telephone Encounter (Signed)
I'm returning your call.  I apologize for you not receiving a call back on yesterday.  Today was the first time that we received a message from you.  I'm not sure where the message went on yesterday.  Dr. Milinda Pointer called you in a prescription for Augmentin to your pharmacy.  He wants you to come in next week and see one of the other providers for a follow-up appointment.  "I don't need an appointment.  He said he wasn't sure if it was infected when I was there but put me on an antibiotic just in case."  Well, he suggested you be seen next week.  "I'm not in town next week."  Well can you come in on Friday?  "No, I cannot."  I'll let Dr. Milinda Pointer know.

## 2021-06-25 DIAGNOSIS — X32XXXA Exposure to sunlight, initial encounter: Secondary | ICD-10-CM | POA: Diagnosis not present

## 2021-06-25 DIAGNOSIS — D485 Neoplasm of uncertain behavior of skin: Secondary | ICD-10-CM | POA: Diagnosis not present

## 2021-06-25 DIAGNOSIS — L570 Actinic keratosis: Secondary | ICD-10-CM | POA: Diagnosis not present

## 2021-07-01 ENCOUNTER — Other Ambulatory Visit: Payer: Self-pay | Admitting: Internal Medicine

## 2021-07-01 DIAGNOSIS — Z1231 Encounter for screening mammogram for malignant neoplasm of breast: Secondary | ICD-10-CM

## 2021-07-21 ENCOUNTER — Ambulatory Visit
Admission: RE | Admit: 2021-07-21 | Discharge: 2021-07-21 | Disposition: A | Discharge status: Home / Self Care | Payer: PPO | Source: Ambulatory Visit | Attending: Internal Medicine | Admitting: Internal Medicine

## 2021-07-21 ENCOUNTER — Other Ambulatory Visit: Payer: Self-pay

## 2021-07-21 DIAGNOSIS — Z1231 Encounter for screening mammogram for malignant neoplasm of breast: Secondary | ICD-10-CM | POA: Diagnosis not present

## 2021-07-25 ENCOUNTER — Other Ambulatory Visit: Payer: Self-pay | Admitting: Internal Medicine

## 2021-07-25 DIAGNOSIS — R928 Other abnormal and inconclusive findings on diagnostic imaging of breast: Secondary | ICD-10-CM

## 2021-07-25 DIAGNOSIS — N6489 Other specified disorders of breast: Secondary | ICD-10-CM

## 2021-08-06 ENCOUNTER — Ambulatory Visit
Admission: RE | Admit: 2021-08-06 | Discharge: 2021-08-06 | Disposition: A | Discharge status: Home / Self Care | Payer: PPO | Source: Ambulatory Visit | Attending: Internal Medicine | Admitting: Internal Medicine

## 2021-08-06 ENCOUNTER — Other Ambulatory Visit: Payer: Self-pay

## 2021-08-06 DIAGNOSIS — N6489 Other specified disorders of breast: Secondary | ICD-10-CM | POA: Diagnosis not present

## 2021-08-06 DIAGNOSIS — R928 Other abnormal and inconclusive findings on diagnostic imaging of breast: Secondary | ICD-10-CM | POA: Insufficient documentation

## 2021-08-06 DIAGNOSIS — R922 Inconclusive mammogram: Secondary | ICD-10-CM | POA: Diagnosis not present

## 2021-08-11 DIAGNOSIS — G5702 Lesion of sciatic nerve, left lower limb: Secondary | ICD-10-CM | POA: Diagnosis not present

## 2021-08-11 DIAGNOSIS — M25552 Pain in left hip: Secondary | ICD-10-CM | POA: Diagnosis not present

## 2021-08-11 DIAGNOSIS — M545 Low back pain, unspecified: Secondary | ICD-10-CM | POA: Diagnosis not present

## 2021-08-14 DIAGNOSIS — H532 Diplopia: Secondary | ICD-10-CM | POA: Diagnosis not present

## 2021-08-25 DIAGNOSIS — M62830 Muscle spasm of back: Secondary | ICD-10-CM | POA: Diagnosis not present

## 2021-08-25 DIAGNOSIS — M5136 Other intervertebral disc degeneration, lumbar region: Secondary | ICD-10-CM | POA: Diagnosis not present

## 2021-09-10 DIAGNOSIS — M5416 Radiculopathy, lumbar region: Secondary | ICD-10-CM | POA: Diagnosis not present

## 2021-09-10 DIAGNOSIS — M48062 Spinal stenosis, lumbar region with neurogenic claudication: Secondary | ICD-10-CM | POA: Diagnosis not present

## 2021-09-19 DIAGNOSIS — C44329 Squamous cell carcinoma of skin of other parts of face: Secondary | ICD-10-CM | POA: Diagnosis not present

## 2021-09-19 DIAGNOSIS — L821 Other seborrheic keratosis: Secondary | ICD-10-CM | POA: Diagnosis not present

## 2021-09-19 DIAGNOSIS — D2262 Melanocytic nevi of left upper limb, including shoulder: Secondary | ICD-10-CM | POA: Diagnosis not present

## 2021-09-19 DIAGNOSIS — D225 Melanocytic nevi of trunk: Secondary | ICD-10-CM | POA: Diagnosis not present

## 2021-09-19 DIAGNOSIS — Z85828 Personal history of other malignant neoplasm of skin: Secondary | ICD-10-CM | POA: Diagnosis not present

## 2021-09-19 DIAGNOSIS — D485 Neoplasm of uncertain behavior of skin: Secondary | ICD-10-CM | POA: Diagnosis not present

## 2021-09-19 DIAGNOSIS — D2261 Melanocytic nevi of right upper limb, including shoulder: Secondary | ICD-10-CM | POA: Diagnosis not present

## 2021-09-19 DIAGNOSIS — D0461 Carcinoma in situ of skin of right upper limb, including shoulder: Secondary | ICD-10-CM | POA: Diagnosis not present

## 2021-09-19 DIAGNOSIS — D0462 Carcinoma in situ of skin of left upper limb, including shoulder: Secondary | ICD-10-CM | POA: Diagnosis not present

## 2021-09-29 DIAGNOSIS — E538 Deficiency of other specified B group vitamins: Secondary | ICD-10-CM | POA: Diagnosis not present

## 2021-09-29 DIAGNOSIS — E782 Mixed hyperlipidemia: Secondary | ICD-10-CM | POA: Diagnosis not present

## 2021-10-06 DIAGNOSIS — E782 Mixed hyperlipidemia: Secondary | ICD-10-CM | POA: Diagnosis not present

## 2021-10-06 DIAGNOSIS — Z0000 Encounter for general adult medical examination without abnormal findings: Secondary | ICD-10-CM | POA: Diagnosis not present

## 2021-10-06 DIAGNOSIS — E538 Deficiency of other specified B group vitamins: Secondary | ICD-10-CM | POA: Diagnosis not present

## 2021-10-06 DIAGNOSIS — I38 Endocarditis, valve unspecified: Secondary | ICD-10-CM | POA: Diagnosis not present

## 2021-10-06 DIAGNOSIS — I5041 Acute combined systolic (congestive) and diastolic (congestive) heart failure: Secondary | ICD-10-CM | POA: Diagnosis not present

## 2021-10-13 DIAGNOSIS — M5416 Radiculopathy, lumbar region: Secondary | ICD-10-CM | POA: Diagnosis not present

## 2021-10-13 DIAGNOSIS — M62830 Muscle spasm of back: Secondary | ICD-10-CM | POA: Diagnosis not present

## 2021-10-13 DIAGNOSIS — M48062 Spinal stenosis, lumbar region with neurogenic claudication: Secondary | ICD-10-CM | POA: Diagnosis not present

## 2021-10-13 DIAGNOSIS — M5136 Other intervertebral disc degeneration, lumbar region: Secondary | ICD-10-CM | POA: Diagnosis not present

## 2021-11-10 ENCOUNTER — Other Ambulatory Visit: Payer: Self-pay | Admitting: Urology

## 2021-11-12 DIAGNOSIS — C44329 Squamous cell carcinoma of skin of other parts of face: Secondary | ICD-10-CM | POA: Diagnosis not present

## 2021-11-12 DIAGNOSIS — L905 Scar conditions and fibrosis of skin: Secondary | ICD-10-CM | POA: Diagnosis not present

## 2021-11-19 DIAGNOSIS — D0462 Carcinoma in situ of skin of left upper limb, including shoulder: Secondary | ICD-10-CM | POA: Diagnosis not present

## 2021-11-19 DIAGNOSIS — D0461 Carcinoma in situ of skin of right upper limb, including shoulder: Secondary | ICD-10-CM | POA: Diagnosis not present

## 2021-12-02 DIAGNOSIS — M5416 Radiculopathy, lumbar region: Secondary | ICD-10-CM | POA: Diagnosis not present

## 2021-12-02 DIAGNOSIS — M48062 Spinal stenosis, lumbar region with neurogenic claudication: Secondary | ICD-10-CM | POA: Diagnosis not present

## 2021-12-17 ENCOUNTER — Ambulatory Visit: Payer: PPO | Admitting: Podiatry

## 2021-12-22 ENCOUNTER — Encounter: Payer: Self-pay | Admitting: Podiatry

## 2021-12-22 ENCOUNTER — Ambulatory Visit: Payer: PPO | Admitting: Podiatry

## 2021-12-22 DIAGNOSIS — B351 Tinea unguium: Secondary | ICD-10-CM

## 2021-12-22 DIAGNOSIS — M79676 Pain in unspecified toe(s): Secondary | ICD-10-CM

## 2021-12-22 NOTE — Progress Notes (Signed)
She presents today complaining of painful elongated nails and painful hallux nails bilaterally. ? ?Objective: Vital signs are stable she is alert and oriented x3.  The distalmost aspect of her toe sits in an extended position at the level of the interphalangeal joint hallux bilateral.  This is resulting in pressure on the nail and pressure on the tuft of the toe distally.  Otherwise her toenails are thick yellow dystrophic and clinically mycotic. ? ?Assessment: Extended hallux at the interphalangeal joint resulting in nail dystrophy and pain to the distal aspect of her hallux bilateral.  Pain in limb secondary to onychomycosis. ? ?Plan: Debridement of toenails 1 through 5 bilateral.  Provided her with silicone padding. ?

## 2022-01-08 ENCOUNTER — Ambulatory Visit (INDEPENDENT_AMBULATORY_CARE_PROVIDER_SITE_OTHER): Payer: PPO

## 2022-01-08 ENCOUNTER — Ambulatory Visit: Payer: PPO | Admitting: Podiatry

## 2022-01-08 DIAGNOSIS — L89519 Pressure ulcer of right ankle, unspecified stage: Secondary | ICD-10-CM

## 2022-01-08 DIAGNOSIS — M7751 Other enthesopathy of right foot: Secondary | ICD-10-CM

## 2022-01-08 DIAGNOSIS — M7750 Other enthesopathy of unspecified foot: Secondary | ICD-10-CM | POA: Diagnosis not present

## 2022-01-08 NOTE — Progress Notes (Signed)
?Subjective:  ?Patient ID: Jocelyn Hooper, female    DOB: [birth year],  MRN: [id number] ? ?Chief Complaint  ?Patient presents with  ? Ankle Pain  ? ? ?81 y.o. female presents with the above complaint.  Patient presents with complaint of right lateral ankle pain.  She states is been going on for last few days is doing much better now.  He states it came out of nowhere is progressive gotten worse.  There is slight redness around the lateral malleolus.  She wanted get it evaluated she has not seen and was prior to seeing me.  She is seeing Dr. Elnoria Howard for routine foot care. ? ? ?Review of Systems: Negative except as noted in the HPI. Denies N/V/F/Ch. ? ?Past Medical History:  ?Diagnosis Date  ? Bursitis of left shoulder   ? Chest pain, unspecified   ? Chronic kidney disease   ? KIDNEY STONE CURRENTLY-08-2015-SEES DR COPE  ? Complication of anesthesia   ? PT HAS SEVERE SCOLIOSIS AND FOR TKR ANESTHESIA COULD NOT GET A SPINAL  ? Coronary artery disease   ? ascvd  ? CSF leak   ? DDD (degenerative disc disease), cervical   ? Diverticulosis   ? Esophageal reflux   ? Fibrocystic breast disease   ? GERD (gastroesophageal reflux disease)   ? Headache   ? H/O  ? History of colon polyps   ? History of hiatal hernia   ? History of kidney stones   ? IBS (irritable bowel syndrome)   ? Nasal polyps   ? Other and unspecified hyperlipidemia   ? Scoliosis   ? Tinnitus of both ears   ? Trigeminal neuralgia syndrome   ? Unspecified sinusitis (chronic)   ? Vitamin B12 deficiency   ? ? ?Current Outpatient Medications:  ?  amoxicillin-clavulanate (AUGMENTIN) 875-125 MG tablet, Take 1 tablet by mouth 2 (two) times daily., Disp: 20 tablet, Rfl: 1 ?  aspirin EC 81 MG tablet, Take 81 mg by mouth daily., Disp: , Rfl:  ?  Calcium Carbonate-Vitamin D 600-400 MG-UNIT tablet, Take 1 tablet by mouth 2 (two) times daily., Disp: , Rfl:  ?  DEXILANT 30 MG capsule, Take 1 capsule by mouth daily., Disp: , Rfl:  ?  Docusate Calcium (STOOL SOFTENER PO), Take 1  tablet by mouth daily. , Disp: , Rfl:  ?  doxycycline (VIBRA-TABS) 100 MG tablet, Take 1 tablet (100 mg total) by mouth 2 (two) times daily., Disp: 20 tablet, Rfl: 0 ?  estradiol (ESTRACE) 0.5 MG tablet, Take 0.5 mg by mouth daily., Disp: , Rfl:  ?  fluticasone (FLONASE) 50 MCG/ACT nasal spray, Place 2 sprays into both nostrils daily. , Disp: , Rfl:  ?  gabapentin (NEURONTIN) 300 MG capsule, Take 300 mg by mouth at bedtime. , Disp: , Rfl:  ?  lansoprazole (PREVACID) 30 MG capsule, Take 30 mg by mouth daily., Disp: , Rfl:  ?  metoCLOPramide (REGLAN) 5 MG tablet, 1 tab in the morning, 1 tab at bedtime, Disp: , Rfl:  ?  MYRBETRIQ 25 MG TB24 tablet, TAKE 1 TABLET BY MOUTH DAILY, Disp: 90 tablet, Rfl: 3 ?  NEOMYCIN-POLYMYXIN-HYDROCORTISONE (CORTISPORIN) 1 % SOLN OTIC solution, Apply 1-2 drops to toe BID after soaking, Disp: 10 mL, Rfl: 1 ?  ondansetron (ZOFRAN-ODT) 4 MG disintegrating tablet, Take 4 mg by mouth every 8 (eight) hours as needed. (Patient not taking: Reported on 05/21/2021), Disp: , Rfl:  ?  sucralfate (CARAFATE) 1 g tablet, Take 1 g by mouth 4 (four)  times daily., Disp: , Rfl:  ?  topiramate (TOPAMAX) 50 MG tablet, Take by mouth., Disp: , Rfl:  ?  vitamin C (ASCORBIC ACID) 500 MG tablet, Take 1,000 mg by mouth 2 (two) times daily., Disp: , Rfl:  ? ?Social History  ? ?Tobacco Use  ?Smoking Status Never  ?Smokeless Tobacco Never  ? ? ?Allergies  ?Allergen Reactions  ? Avelox [Moxifloxacin Hcl In Nacl] Other (See Comments)  ?  Redness all over in about 5 minutes after taking  ? Codeine Nausea Only  ?  REACTION: nausea  ? Acyclovir And Related   ? Shrimp [Shellfish Allergy]   ?  Hives  ?  ? Ciprofloxacin Rash  ? Moxifloxacin Hives and Rash  ?  Other reaction(s): OTHER  ? Sucralfate Rash  ?  Itching   ? ?Objective:  ?There were no vitals filed for this visit. ?There is no height or weight on file to calculate BMI. ?Constitutional Well developed. ?Well nourished.  ?Vascular Dorsalis pedis pulses palpable  bilaterally. ?Posterior tibial pulses palpable bilaterally. ?Capillary refill normal to all digits.  ?No cyanosis or clubbing noted. ?Pedal hair growth normal.  ?Neurologic Normal speech. ?Oriented to person, place, and time. ?Epicritic sensation to light touch grossly present bilaterally.  ?Dermatologic Right pressure spot without breaking down of the skin noted to the ankle.  Mild pain on palpation.  No ulceration noted.  Skin atrophy noted  ?Orthopedic: Normal joint ROM without pain or crepitus bilaterally. ?No visible deformities. ?No bony tenderness.  ? ?Radiographs: None ?Assessment:  ? ?1. Tendinitis of ankle   ?2. Pressure injury of skin of right ankle, unspecified injury stage   ? ?Plan:  ?Patient was evaluated and treated and all questions answered. ? ?Right lateral ankle pressure sore ?-All questions and concerns were discussed with the patient in extensive detail. ?-I discussed with that this is likely positional especially at nighttime she likes to sleep with that ankle down and is probably leading to a lot of pressure on the lateral side of it.  I encourage position change as well as offloading with a pillow.  I discussed with the patient she states understanding.  At this time there is no breakdown of the skin ? ?No follow-ups on file. ?

## 2022-01-26 NOTE — Progress Notes (Signed)
01/27/2022 7:47 AM   Gerhard Munch Yerby April 15, 1941 [id number]  Referring provider: Rusty Aus, MD [facility] [place],  [place] [address]  Chief Complaint  Patient presents with   Nephrolithiasis   Urinary Incontinence   Over Active Bladder   Urological history: 1. Urge incontinence -contributing factors of age, vaginal atrophy and lumbar DDD,  - managed with Myrbetriq 25 mg daily - PVR 56 mL  2. Nephrolithiasis -stone composition calcium oxalate -ESWL, 12/2020  3. rUTI's - last documented UTI 2019 - risk factors of age, post menopausal, constipation and incontinence   HPI: CERI MAYER is a 81 y.o. female who presents today for one year follow up.    KUB no definite stones seen.   UA moderate bacteria and > 10 squames  She is having 1-7 daytime urinations, 0-1 urinations at night with a mild urge to urinate.  She does have some urge incontinence.  She leaks about 1 times weekly.  She does not wear protective gear for leakage.  She is taking the Myrbetriq 25 mg daily and believes it is helping.   She has been having bilateral flank pain and right lower quadrant pain on and off for several weeks and she states is difficult to know whether or not this pain is due from her back or nephrolithiasis.  PMH: Past Medical History:  Diagnosis Date   Bursitis of left shoulder    Chest pain, unspecified    Chronic kidney disease    KIDNEY STONE [id number] DR COPE   Complication of anesthesia    PT HAS SEVERE SCOLIOSIS AND FOR TKR ANESTHESIA COULD NOT GET A SPINAL   Coronary artery disease    ascvd   CSF leak    DDD (degenerative disc disease), cervical    Diverticulosis    Esophageal reflux    Fibrocystic breast disease    GERD (gastroesophageal reflux disease)    Headache    H/O   History of colon polyps    History of hiatal hernia    History of kidney stones    IBS (irritable bowel syndrome)    Nasal polyps     Other and unspecified hyperlipidemia    Scoliosis    Tinnitus of both ears    Trigeminal neuralgia syndrome    Unspecified sinusitis (chronic)    Vitamin B12 deficiency     Surgical History: Past Surgical History:  Procedure Laterality Date   ABDOMINAL HYSTERECTOMY     APPENDECTOMY     BACK SURGERY  1982   disc   CARDIAC CATHETERIZATION     [place]    CATARACT EXTRACTION     left    CATARACT EXTRACTION     right   CHOLECYSTECTOMY  2002   COLONOSCOPY WITH PROPOFOL N/A 03/10/2017   Procedure: COLONOSCOPY WITH PROPOFOL;  Surgeon: Manya Silvas, MD;  Location[facility] ENDOSCOPY;  Service: Endoscopy;  Laterality: N/A;   COLONOSCOPY WITH PROPOFOL N/A 01/13/2021   Procedure: COLONOSCOPY WITH PROPOFOL;  Surgeon: Toledo, Benay Pike, MD;  Location: [facility] ENDOSCOPY;  Service: Gastroenterology;  Laterality: N/A;   ESOPHAGOGASTRODUODENOSCOPY (EGD) WITH PROPOFOL N/A 01/13/2021   Procedure: ESOPHAGOGASTRODUODENOSCOPY (EGD) WITH PROPOFOL;  Surgeon: Toledo, Benay Pike, MD;  Location: [facility] ENDOSCOPY;  Service: Gastroenterology;  Laterality: N/A;   EXTRACORPOREAL SHOCK WAVE LITHOTRIPSY Right 01/09/2021   Procedure: EXTRACORPOREAL SHOCK WAVE LITHOTRIPSY (ESWL);  Surgeon: Hollice Espy, MD;  Location: [facility] ORS;  Service: Urology;  Laterality: Right;   EYE  SURGERY Bilateral    Cataract Extraction with IOL   hysterectomy (other)  1974   JOINT REPLACEMENT     LEFT knee   KIDNEY STONE SURGERY     KNEE ARTHROPLASTY Right 09/27/2017   Procedure: COMPUTER ASSISTED TOTAL KNEE ARTHROPLASTY;  Surgeon: Dereck Leep, MD;  Location: [facility] ORS;  Service: Orthopedics;  Laterality: Right;   KNEE ARTHROSCOPY Right 09/13/2015   Procedure: ARTHROSCOPY KNEE, PARTIAL LATERAL MENISECTOMY, CHONDROPLASTY ;  Surgeon: Dereck Leep, MD;  Location: [facility] ORS;  Service: Orthopedics;  Laterality: Right;   KNEE SURGERY  2004   having a lot of problems with left knee   NASAL SINUS SURGERY  1967, 1988, 1993, 2003, 2012    sinus procedures  1970,1990,1994,2005   TONSILLECTOMY      Home Medications:  Allergies as of 01/27/2022       Reactions   Avelox [moxifloxacin Hcl In Nacl] Other (See Comments)   Redness all over in about 5 minutes after taking   Codeine Nausea Only   REACTION: nausea   Acyclovir And Related    Shrimp [shellfish Allergy]    Hives    Ciprofloxacin Rash   Moxifloxacin Hives, Rash   Other reaction(s): OTHER   Sucralfate Rash   Itching         Medication List        Accurate as of Jan 27, 2022 11:59 PM. If you have any questions, ask your nurse or doctor.          STOP taking these medications    amoxicillin-clavulanate 875-125 MG tablet Commonly known as: Augmentin Stopped by: Zara Council, PA-C   doxycycline 100 MG tablet Commonly known as: VIBRA-TABS Stopped by: Zara Council, PA-C       TAKE these medications    aspirin EC 81 MG tablet Take 81 mg by mouth daily.   Calcium Carbonate-Vitamin D 600-400 MG-UNIT tablet Take 1 tablet by mouth 2 (two) times daily.   Dexilant 30 MG capsule DR Generic drug: Dexlansoprazole Take 1 capsule by mouth daily.   estradiol 0.5 MG tablet Commonly known as: ESTRACE Take 0.5 mg by mouth daily.   fluticasone 50 MCG/ACT nasal spray Commonly known as: FLONASE Place 2 sprays into both nostrils daily.   gabapentin 300 MG capsule Commonly known as: NEURONTIN Take 300 mg by mouth at bedtime.   lansoprazole 30 MG capsule Commonly known as: PREVACID Take 30 mg by mouth daily.   metoCLOPramide 5 MG tablet Commonly known as: REGLAN 1 tab in the morning, 1 tab at bedtime   Myrbetriq 25 MG Tb24 tablet Generic drug: mirabegron ER TAKE 1 TABLET BY MOUTH DAILY   NEOMYCIN-POLYMYXIN-HYDROCORTISONE 1 % Soln OTIC solution Commonly known as: CORTISPORIN Apply 1-2 drops to toe BID after soaking   ondansetron 4 MG disintegrating tablet Commonly known as: ZOFRAN-ODT Take 4 mg by mouth every 8 (eight) hours as  needed.   STOOL SOFTENER PO Take 1 tablet by mouth daily.   sucralfate 1 g tablet Commonly known as: CARAFATE Take 1 g by mouth 4 (four) times daily.   topiramate 50 MG tablet Commonly known as: TOPAMAX Take by mouth.   vitamin C 500 MG tablet Commonly known as: ASCORBIC ACID Take 1,000 mg by mouth 2 (two) times daily.        Allergies:  Allergies  Allergen Reactions   Avelox [Moxifloxacin Hcl In Nacl] Other (See Comments)    Redness all over in about 5 minutes after taking   Codeine  Nausea Only    REACTION: nausea   Acyclovir And Related    Shrimp [Shellfish Allergy]     Hives     Ciprofloxacin Rash   Moxifloxacin Hives and Rash    Other reaction(s): OTHER   Sucralfate Rash    Itching     Family History: Family History  Problem Relation Age of Onset   Heart attack Mother 46   Heart failure Mother    Heart attack Father    Heart disease Father        CABG   Heart attack Brother 46       premature   Breast cancer Sister 41    Social History:  reports that she has never smoked. She has never used smokeless tobacco. She reports that she does not drink alcohol and does not use drugs.  ROS: Pertinent ROS in HPI  Physical Exam: BP 110/72   Pulse 89   Ht '5\' 5"'  (1.651 m)   Wt 135 lb (61.2 kg)   BMI 22.47 kg/m   Constitutional:  Well nourished. Alert and oriented, No acute distress. HEENT: [place] AT, moist mucus membranes.  Trachea midline, no masses. Cardiovascular: No clubbing, cyanosis, or edema. Respiratory: Normal respiratory effort, no increased work of breathing. Neurologic: Grossly intact, no focal deficits, moving all 4 extremities. Psychiatric: Normal mood and affect.    Laboratory Data: WBC (White Blood Cell Count) 4.1 - 10.2 10^3/uL 6.4   RBC (Red Blood Cell Count) 4.04 - 5.48 10^6/uL 3.97 Low    Hemoglobin 12.0 - 15.0 gm/dL 12.7   Hematocrit 35.0 - 47.0 % 38.5   MCV (Mean Corpuscular Volume) 80.0 - 100.0 fl 97.0   MCH (Mean Corpuscular  Hemoglobin) 27.0 - 31.2 pg 32.0 High    MCHC (Mean Corpuscular Hemoglobin Concentration) 32.0 - 36.0 gm/dL 33.0   Platelet Count 150 - 450 10^3/uL 263   RDW-CV (Red Cell Distribution Width) 11.6 - 14.8 % 13.2   MPV (Mean Platelet Volume) 9.4 - 12.4 fl 9.7   Neutrophils 1.50 - 7.80 10^3/uL 3.94   Lymphocytes 1.00 - 3.60 10^3/uL 1.60   Monocytes 0.00 - 1.50 10^3/uL 0.54   Eosinophils 0.00 - 0.55 10^3/uL 0.30   Basophils 0.00 - 0.09 10^3/uL 0.05   Neutrophil % 32.0 - 70.0 % 61.1   Lymphocyte % 10.0 - 50.0 % 24.8   Monocyte % 4.0 - 13.0 % 8.4   Eosinophil % 1.0 - 5.0 % 4.7   Basophil% 0.0 - 2.0 % 0.8   Immature Granulocyte % <=0.7 % 0.2   Immature Granulocyte Count <=0.06 10^3/L 0.01   Resulting Agency  [facility] - [facility]  Specimen Collected: 09/29/21 08:19 Last Resulted: 09/29/21 11:37  Received From: [facility]  Result Received: 12/22/21 15:14   Glucose 70 - 110 mg/dL 85   Sodium 136 - 145 mmol/L 137   Potassium 3.6 - 5.1 mmol/L 4.3   Chloride 97 - 109 mmol/L 106   Carbon Dioxide (CO2) 22.0 - 32.0 mmol/L 25.1   Urea Nitrogen (BUN) 7 - 25 mg/dL 15   Creatinine 0.6 - 1.1 mg/dL 0.8   Glomerular Filtration Rate (eGFR), MDRD Estimate >60 mL/min/1.73sq m 69   Calcium 8.7 - 10.3 mg/dL 9.5   AST  8 - 39 U/L 16   ALT  5 - 38 U/L 9   Alk Phos (alkaline Phosphatase) 34 - 104 U/L 58   Albumin 3.5 - 4.8 g/dL 4.0   Bilirubin, Total 0.3 - 1.2  mg/dL 0.5   Protein, Total 6.1 - 7.9 g/dL 6.0 Low    A/G Ratio 1.0 - 5.0 gm/dL 2.0   Resulting [facility] - [facility]  Specimen Collected: 09/29/21 08:19 Last Resulted: 09/29/21 14:39  Received From: [facility]  Result Received: 12/22/21 15:14   Color Yellow, Violet, Light Violet, Dark Violet Yellow   Clarity Clear, Other Cloudy Abnormal    Specific Gravity 1.000 - 1.030 1.015   pH, Urine 5.0 - 8.0 7.0   Protein, Urinalysis Negative, Trace mg/dL Negative   Glucose, Urinalysis Negative mg/dL  Negative   Ketones, Urinalysis Negative mg/dL Negative   Blood, Urinalysis Negative Negative   Nitrite, Urinalysis Negative Negative   Leukocyte Esterase, Urinalysis Negative Negative   White Blood Cells, Urinalysis None Seen, 0-3 /hpf None Seen   Red Blood Cells, Urinalysis None Seen, 0-3 /hpf None Seen   Bacteria, Urinalysis None Seen /hpf Few Abnormal    Squamous Epithelial Cells, Urinalysis Rare, Few, None Seen /hpf Many Abnormal    Resulting Agency  [facility] - [facility]  Specimen Collected: 09/29/21 08:19 Last Resulted: 09/29/21 09:36  Received From: [facility]  Result Received: 12/22/21 15:14   Urinalysis Component     Latest Ref Rng 01/27/2022  Glucose, UA     Negative  Negative   Leukocytes,UA     Negative  Negative   Specific Gravity, UA     1.005 - 1.030  >1.030 (H)   pH, UA     5.0 - 7.5  6.0   Color, UA     Yellow  Yellow   Appearance Ur     Clear  Hazy !   Protein,UA     Negative/Trace  Trace !   Ketones, UA     Negative  Negative   RBC, UA     Negative  Negative   Bilirubin, UA     Negative  Negative   Urobilinogen, Ur     0.2 - 1.0 mg/dL 1.0   Nitrite, UA     Negative  Negative   Microscopic Examination See below:     Component     Latest Ref Rng 01/27/2022  RBC     0 - 2 /hpf 0-2   WBC, UA     0 - 5 /hpf 0-5   Epithelial Cells (non renal)     0 - 10 /hpf >10 !   Bacteria, UA     None seen/Few  Moderate !   I have reviewed the labs.   Pertinent Imaging: CLINICAL DATA:  Overactive bladder and kidney stones.   EXAM: ABDOMEN - 1 VIEW   COMPARISON:  Abdominal radiograph dated 01/24/2021.   FINDINGS: There is moderate stool throughout the colon. No bowel dilatation or evidence of obstruction. No free air. Evaluation for kidney stone is limited due to colonic stool burden and superimposed costochondral calcification. No stone noted over the bladder. There is osteopenia with severe degenerative changes of the spine and  scoliosis. No acute osseous pathology.   IMPRESSION: Moderate colonic stool burden. No bowel obstruction.     Electronically Signed   By: Anner Crete M.D.   On: 01/28/2022 18:23   I have independently reviewed the films.  See HPI.    Assessment & Plan:    1. Nephrolithiasis -Due to patient's nonspecific symptoms and history of scoliosis and calcifications on KUB it makes it difficult to evaluate her for stone burden and location of stones -  We will go ahead and obtain a CT renal stone study at this time for further evaluation  2. OAB -Continue Myrbetriq 25 mg daily   Return for CT renal stone report .  These notes generated with voice recognition software. I apologize for typographical errors.  Zara Council, PA-C  [facility] [address]  [place], [place] [address] [phone]

## 2022-01-27 ENCOUNTER — Ambulatory Visit
Admission: RE | Admit: 2022-01-27 | Discharge: 2022-01-27 | Disposition: A | Discharge status: Home / Self Care | Payer: PPO | Attending: Urology | Admitting: Urology

## 2022-01-27 ENCOUNTER — Other Ambulatory Visit: Payer: Self-pay

## 2022-01-27 ENCOUNTER — Ambulatory Visit: Payer: PPO | Admitting: Urology

## 2022-01-27 ENCOUNTER — Encounter: Payer: Self-pay | Admitting: Urology

## 2022-01-27 ENCOUNTER — Ambulatory Visit
Admission: RE | Admit: 2022-01-27 | Discharge: 2022-01-27 | Disposition: A | Discharge status: Home / Self Care | Payer: PPO | Source: Ambulatory Visit | Attending: Urology | Admitting: Urology

## 2022-01-27 VITALS — BP 110/72 | HR 89 | Ht 65.0 in | Wt 135.0 lb

## 2022-01-27 DIAGNOSIS — M419 Scoliosis, unspecified: Secondary | ICD-10-CM | POA: Diagnosis not present

## 2022-01-27 DIAGNOSIS — N3281 Overactive bladder: Secondary | ICD-10-CM

## 2022-01-27 DIAGNOSIS — Z87442 Personal history of urinary calculi: Secondary | ICD-10-CM | POA: Diagnosis not present

## 2022-01-27 DIAGNOSIS — Z87440 Personal history of urinary (tract) infections: Secondary | ICD-10-CM | POA: Diagnosis not present

## 2022-01-27 DIAGNOSIS — N3941 Urge incontinence: Secondary | ICD-10-CM | POA: Diagnosis not present

## 2022-01-27 DIAGNOSIS — N200 Calculus of kidney: Secondary | ICD-10-CM | POA: Diagnosis not present

## 2022-01-27 LAB — BLADDER SCAN AMB NON-IMAGING

## 2022-01-27 NOTE — Patient Instructions (Signed)
"  Penalty" List  Bathroom Penalty: An extra 4-ounce glass of water with every visit to the bathroom, regardless of the reason. Chair Penalty: Have a bottle of water and a 4-ounce glass next to their favorite chairs at home. Instruct the patient to drink one 4-ounce glass before getting up from the chair. Cheating Penalty: If they eat or drink a restricted item beyond the allowable limit, one extra glass of water. Kitchen Penalty: One glass of water whenever they walk into their kitchen. Leaving Home Penalty. One extra glass of water prior to leaving home and another when they return. Meal Penalty: One extra glass with each meal except when they eat out, where they will need two extra glasses. Medication Penalty: One extra glass of water when taking medications. This is in addition to whatever patients are instructed to take normally with their medications. Nighttime Bathroom Use Penalty: One glass of water whenever they get out of bed to go to the bathroom at night. Snack Penalty: One extra glass of water if they have a snack between meals or bedtime. Summertime Penalty: Double all other penalties during the summer or when outside temperatures exceed 85 degrees. Time Penalty: One glass of water if the patient has managed to avoid all other "penalties" for 2 hours during the daytime. Water Fountain Penalty: They must drink at least 5 swallows whenever they pass a water fountain. Work Penalty: One glass of water whenever they leave their main desk or workplace. Patients should always have water and a 4-ounce glass available on their desks, in their cars, or within easy reach.  

## 2022-01-28 LAB — URINALYSIS, COMPLETE
Bilirubin, UA: NEGATIVE
Glucose, UA: NEGATIVE
Ketones, UA: NEGATIVE
Leukocytes,UA: NEGATIVE
Nitrite, UA: NEGATIVE
RBC, UA: NEGATIVE
Specific Gravity, UA: >1.03 — ABNORMAL HIGH (ref 1.005–1.030)
Urobilinogen, Ur: 1 mg/dL (ref 0.2–1.0)
pH, UA: 6 (ref 5.0–7.5)

## 2022-01-28 LAB — MICROSCOPIC EXAMINATION: Epithelial Cells (non renal): >10 /hpf — AB (ref 0–10)

## 2022-02-11 ENCOUNTER — Ambulatory Visit
Admission: RE | Admit: 2022-02-11 | Discharge: 2022-02-11 | Disposition: A | Discharge status: Home / Self Care | Payer: PPO | Source: Ambulatory Visit | Attending: Urology | Admitting: Urology

## 2022-02-11 DIAGNOSIS — I878 Other specified disorders of veins: Secondary | ICD-10-CM | POA: Diagnosis not present

## 2022-02-11 DIAGNOSIS — N200 Calculus of kidney: Secondary | ICD-10-CM | POA: Diagnosis not present

## 2022-02-11 DIAGNOSIS — K5730 Diverticulosis of large intestine without perforation or abscess without bleeding: Secondary | ICD-10-CM | POA: Diagnosis not present

## 2022-02-11 DIAGNOSIS — K449 Diaphragmatic hernia without obstruction or gangrene: Secondary | ICD-10-CM | POA: Diagnosis not present

## 2022-02-16 ENCOUNTER — Ambulatory Visit: Payer: PPO | Admitting: Podiatry

## 2022-02-16 ENCOUNTER — Encounter: Payer: Self-pay | Admitting: Podiatry

## 2022-02-16 DIAGNOSIS — R0989 Other specified symptoms and signs involving the circulatory and respiratory systems: Secondary | ICD-10-CM

## 2022-02-16 DIAGNOSIS — M7072 Other bursitis of hip, left hip: Secondary | ICD-10-CM | POA: Diagnosis not present

## 2022-02-16 DIAGNOSIS — L89519 Pressure ulcer of right ankle, unspecified stage: Secondary | ICD-10-CM | POA: Diagnosis not present

## 2022-02-16 MED ORDER — NITROGLYCERIN 0.2 MG/HR TD PT24: 0.2000 mg | MEDICATED_PATCH | Freq: Every day | TRANSDERMAL | 12 refills | Status: DC

## 2022-02-16 NOTE — Progress Notes (Signed)
She presents today for follow-up of her lateral ankle pain.  She states she saw Dr. Posey Pronto but really did not understand what he was doing and I did nothing to help her.  Objective: Vital signs stable alert and oriented x3.  Pulses are minimally palpable to the right lower extremity particularly dorsalis pedis and lateral tarsal artery.  She has a superficial wound to the lateral ankle that is exquisitely tender.  Assessment: Most likely this is an arterial ulceration.  Peripheral vascular disease  Plan: We will send her to  vein and vascular she has been there before.  I am also going to write a prescription for 0.2 mg nitroglycerin patch to be placed just superior to the wound.  Also provided her with silicone pad.  I will follow-up with her once the ABIs are performed.

## 2022-02-19 ENCOUNTER — Ambulatory Visit (INDEPENDENT_AMBULATORY_CARE_PROVIDER_SITE_OTHER): Payer: PPO | Admitting: Urology

## 2022-02-19 DIAGNOSIS — N3281 Overactive bladder: Secondary | ICD-10-CM

## 2022-02-19 DIAGNOSIS — N200 Calculus of kidney: Secondary | ICD-10-CM | POA: Diagnosis not present

## 2022-02-19 NOTE — Progress Notes (Signed)
Virtual Visit via Telephone Note  I connected with Mingo on 02/19/2022 at  1:00 PM EDT by telephone and verified that I am speaking with the correct person using two identifiers.  Location: Patient: Home Provider: office   I discussed the limitations, risks, security and privacy concerns of performing an evaluation and management service by telephone and the availability of in person appointments. I also discussed with the patient that there may be a patient responsible charge related to this service. The patient expressed understanding and agreed to proceed.   Urological History:  1. Urge incontinence -contributing factors of age, vaginal atrophy and lumbar DDD,  - managed with Myrbetriq 25 mg daily   2. Nephrolithiasis -stone composition calcium oxalate -ESWL, 12/2020 - KUB on 01/27/2022 visualized limited imaging due to colonic stool bur no stones were noted    3. rUTI's - last documented UTI 2019 - risk factors of age, post menopausal, constipation and incontinence   History of Present Illness: Jocelyn Hooper is a 81 y.o. female who presents today via telephone to discuss CT scan results.   She underwent a CT renal stone study on 02/12/2022 that visualized Nonobstructive right renal stones measure up to 4 mm. Punctate nonobstructive left interpolar renal stone. No obstructive ureteral or bladder calculi identified. Pelvic phleboliths.     Observations/Objective: She is doing well today. She reports that she is still having discomfort.   She reports that myrbetriq is minimally improving her symptoms.   Patient denies any modifying or aggravating factors.  Patient denies any gross hematuria, dysuria or suprapubic/flank pain.  Patient denies any fevers, chills, nausea or vomiting.  Assessment and Plan: 1. Nephrolithiasis - CT renal stone study was reassuring  - Her pain is likely musculoskeletal in nature   2. OAB - Continue Myrbetriq 25 mg but switch it to every other  day  Follow Up Instructions:    I discussed the assessment and treatment plan with the patient. The patient was provided an opportunity to ask questions and all were answered. The patient agreed with the plan and demonstrated an understanding of the instructions.   The patient was advised to call back or seek an in-person evaluation if the symptoms worsen or if the condition fails to improve as anticipated.  I provided 20 minutes of non-face-to-face time during this encounter.   I,Herve Haug,acting as a Education administrator for [employer], PA-C.,have documented all relevant documentation on the behalf of [place], PA-C,as directed by  [facility], PA-C while in the presence of Lorain Fettes, PA-C.  I have reviewed the above documentation for accuracy and completeness, and I agree with the above.    Zara Council, PA-C

## 2022-02-23 ENCOUNTER — Ambulatory Visit (INDEPENDENT_AMBULATORY_CARE_PROVIDER_SITE_OTHER): Payer: PPO

## 2022-02-23 DIAGNOSIS — R0989 Other specified symptoms and signs involving the circulatory and respiratory systems: Secondary | ICD-10-CM | POA: Diagnosis not present

## 2022-02-24 DIAGNOSIS — M7072 Other bursitis of hip, left hip: Secondary | ICD-10-CM | POA: Diagnosis not present

## 2022-02-24 DIAGNOSIS — M76892 Other specified enthesopathies of left lower limb, excluding foot: Secondary | ICD-10-CM | POA: Diagnosis not present

## 2022-03-02 NOTE — Progress Notes (Signed)
Patient has an appt for 7/5

## 2022-03-18 ENCOUNTER — Ambulatory Visit: Payer: PPO | Admitting: Podiatry

## 2022-03-18 ENCOUNTER — Encounter: Payer: Self-pay | Admitting: Podiatry

## 2022-03-18 DIAGNOSIS — L89519 Pressure ulcer of right ankle, unspecified stage: Secondary | ICD-10-CM | POA: Diagnosis not present

## 2022-03-18 DIAGNOSIS — R0989 Other specified symptoms and signs involving the circulatory and respiratory systems: Secondary | ICD-10-CM

## 2022-03-18 NOTE — Progress Notes (Signed)
Jocelyn Hooper presents today for follow-up of her lateral arterial ulceration of her right ankle.  She states that some days are better than others she just tries to keep it dressed and padded to prevent it from being so irritated and painful.  She states that she had her vascular study done and they told her that everything was great.  Objective: Vital signs are stable she is alert oriented x3 vascular studies did demonstrate a 1.2 and 1.2 bilateral ABIs which consistent with hardening of the arteries.  That would also inhibit palpable pulses as well as the flexibility of the arterials to push the blood to the skin.  Skin is intact at this point there is no ulcerative lesion present that though there is some erythema present.  And measures about a centimeter in diameter.  There is no purulence no malodor no open lesion or wound.  Assessment: Peripheral vascular disease with hardening of the arteries and a chronic ulceration that is currently healed over.  Plan: Continue to pad and follow-up with me as needed

## 2022-04-14 DIAGNOSIS — E782 Mixed hyperlipidemia: Secondary | ICD-10-CM | POA: Diagnosis not present

## 2022-04-14 DIAGNOSIS — E538 Deficiency of other specified B group vitamins: Secondary | ICD-10-CM | POA: Diagnosis not present

## 2022-04-14 DIAGNOSIS — I38 Endocarditis, valve unspecified: Secondary | ICD-10-CM | POA: Diagnosis not present

## 2022-04-14 DIAGNOSIS — I5041 Acute combined systolic (congestive) and diastolic (congestive) heart failure: Secondary | ICD-10-CM | POA: Diagnosis not present

## 2022-04-21 DIAGNOSIS — E538 Deficiency of other specified B group vitamins: Secondary | ICD-10-CM | POA: Diagnosis not present

## 2022-04-21 DIAGNOSIS — M5136 Other intervertebral disc degeneration, lumbar region: Secondary | ICD-10-CM | POA: Diagnosis not present

## 2022-04-21 DIAGNOSIS — I5041 Acute combined systolic (congestive) and diastolic (congestive) heart failure: Secondary | ICD-10-CM | POA: Diagnosis not present

## 2022-04-21 DIAGNOSIS — I38 Endocarditis, valve unspecified: Secondary | ICD-10-CM | POA: Diagnosis not present

## 2022-04-21 DIAGNOSIS — E782 Mixed hyperlipidemia: Secondary | ICD-10-CM | POA: Diagnosis not present

## 2022-05-27 DIAGNOSIS — L570 Actinic keratosis: Secondary | ICD-10-CM | POA: Diagnosis not present

## 2022-05-27 DIAGNOSIS — D225 Melanocytic nevi of trunk: Secondary | ICD-10-CM | POA: Diagnosis not present

## 2022-05-27 DIAGNOSIS — D2262 Melanocytic nevi of left upper limb, including shoulder: Secondary | ICD-10-CM | POA: Diagnosis not present

## 2022-05-27 DIAGNOSIS — X32XXXA Exposure to sunlight, initial encounter: Secondary | ICD-10-CM | POA: Diagnosis not present

## 2022-05-27 DIAGNOSIS — D2261 Melanocytic nevi of right upper limb, including shoulder: Secondary | ICD-10-CM | POA: Diagnosis not present

## 2022-05-27 DIAGNOSIS — D2272 Melanocytic nevi of left lower limb, including hip: Secondary | ICD-10-CM | POA: Diagnosis not present

## 2022-05-27 DIAGNOSIS — L821 Other seborrheic keratosis: Secondary | ICD-10-CM | POA: Diagnosis not present

## 2022-05-27 DIAGNOSIS — D2271 Melanocytic nevi of right lower limb, including hip: Secondary | ICD-10-CM | POA: Diagnosis not present

## 2022-06-01 DIAGNOSIS — M7062 Trochanteric bursitis, left hip: Secondary | ICD-10-CM | POA: Diagnosis not present

## 2022-06-01 DIAGNOSIS — M25552 Pain in left hip: Secondary | ICD-10-CM | POA: Diagnosis not present

## 2022-06-01 DIAGNOSIS — M4125 Other idiopathic scoliosis, thoracolumbar region: Secondary | ICD-10-CM | POA: Diagnosis not present

## 2022-06-01 DIAGNOSIS — M76892 Other specified enthesopathies of left lower limb, excluding foot: Secondary | ICD-10-CM | POA: Diagnosis not present

## 2022-06-01 DIAGNOSIS — M47816 Spondylosis without myelopathy or radiculopathy, lumbar region: Secondary | ICD-10-CM | POA: Diagnosis not present

## 2022-06-19 ENCOUNTER — Other Ambulatory Visit: Payer: Self-pay | Admitting: Internal Medicine

## 2022-06-19 DIAGNOSIS — Z1231 Encounter for screening mammogram for malignant neoplasm of breast: Secondary | ICD-10-CM

## 2022-07-01 DIAGNOSIS — L244 Irritant contact dermatitis due to drugs in contact with skin: Secondary | ICD-10-CM | POA: Diagnosis not present

## 2022-07-03 DIAGNOSIS — L244 Irritant contact dermatitis due to drugs in contact with skin: Secondary | ICD-10-CM | POA: Diagnosis not present

## 2022-07-13 ENCOUNTER — Encounter (INDEPENDENT_AMBULATORY_CARE_PROVIDER_SITE_OTHER): Payer: Self-pay

## 2022-07-13 DIAGNOSIS — M4125 Other idiopathic scoliosis, thoracolumbar region: Secondary | ICD-10-CM | POA: Diagnosis not present

## 2022-07-13 DIAGNOSIS — M47816 Spondylosis without myelopathy or radiculopathy, lumbar region: Secondary | ICD-10-CM | POA: Diagnosis not present

## 2022-07-13 DIAGNOSIS — M533 Sacrococcygeal disorders, not elsewhere classified: Secondary | ICD-10-CM | POA: Diagnosis not present

## 2022-07-13 DIAGNOSIS — M1612 Unilateral primary osteoarthritis, left hip: Secondary | ICD-10-CM | POA: Diagnosis not present

## 2022-07-13 DIAGNOSIS — M7062 Trochanteric bursitis, left hip: Secondary | ICD-10-CM | POA: Diagnosis not present

## 2022-07-13 DIAGNOSIS — M76892 Other specified enthesopathies of left lower limb, excluding foot: Secondary | ICD-10-CM | POA: Diagnosis not present

## 2022-07-24 DIAGNOSIS — H0015 Chalazion left lower eyelid: Secondary | ICD-10-CM | POA: Diagnosis not present

## 2022-07-28 ENCOUNTER — Ambulatory Visit
Admission: RE | Admit: 2022-07-28 | Discharge: 2022-07-28 | Disposition: A | Discharge status: Home / Self Care | Payer: PPO | Source: Ambulatory Visit | Attending: Internal Medicine | Admitting: Internal Medicine

## 2022-07-28 DIAGNOSIS — Z1231 Encounter for screening mammogram for malignant neoplasm of breast: Secondary | ICD-10-CM | POA: Insufficient documentation

## 2022-09-02 ENCOUNTER — Emergency Department: Payer: PPO

## 2022-09-02 ENCOUNTER — Other Ambulatory Visit: Payer: Self-pay

## 2022-09-02 ENCOUNTER — Emergency Department
Admission: EM | Admit: 2022-09-02 | Discharge: 2022-09-02 | Disposition: A | Discharge status: Home / Self Care | Payer: PPO | Attending: Emergency Medicine | Admitting: Emergency Medicine

## 2022-09-02 DIAGNOSIS — S0003XA Contusion of scalp, initial encounter: Secondary | ICD-10-CM | POA: Diagnosis not present

## 2022-09-02 DIAGNOSIS — S0181XA Laceration without foreign body of other part of head, initial encounter: Secondary | ICD-10-CM | POA: Insufficient documentation

## 2022-09-02 DIAGNOSIS — S0990XA Unspecified injury of head, initial encounter: Secondary | ICD-10-CM | POA: Diagnosis not present

## 2022-09-02 DIAGNOSIS — S199XXA Unspecified injury of neck, initial encounter: Secondary | ICD-10-CM | POA: Diagnosis not present

## 2022-09-02 DIAGNOSIS — W19XXXA Unspecified fall, initial encounter: Secondary | ICD-10-CM | POA: Diagnosis not present

## 2022-09-02 DIAGNOSIS — S61419A Laceration without foreign body of unspecified hand, initial encounter: Secondary | ICD-10-CM

## 2022-09-02 DIAGNOSIS — S61411A Laceration without foreign body of right hand, initial encounter: Secondary | ICD-10-CM | POA: Diagnosis not present

## 2022-09-02 DIAGNOSIS — Y92481 Parking lot as the place of occurrence of the external cause: Secondary | ICD-10-CM | POA: Insufficient documentation

## 2022-09-02 DIAGNOSIS — M4322 Fusion of spine, cervical region: Secondary | ICD-10-CM | POA: Diagnosis not present

## 2022-09-02 DIAGNOSIS — S0083XA Contusion of other part of head, initial encounter: Secondary | ICD-10-CM | POA: Insufficient documentation

## 2022-09-02 DIAGNOSIS — J45909 Unspecified asthma, uncomplicated: Secondary | ICD-10-CM | POA: Insufficient documentation

## 2022-09-02 DIAGNOSIS — K449 Diaphragmatic hernia without obstruction or gangrene: Secondary | ICD-10-CM | POA: Insufficient documentation

## 2022-09-02 DIAGNOSIS — S61216A Laceration without foreign body of right little finger without damage to nail, initial encounter: Secondary | ICD-10-CM | POA: Diagnosis not present

## 2022-09-02 DIAGNOSIS — R58 Hemorrhage, not elsewhere classified: Secondary | ICD-10-CM | POA: Diagnosis not present

## 2022-09-02 DIAGNOSIS — I700 Atherosclerosis of aorta: Secondary | ICD-10-CM | POA: Diagnosis not present

## 2022-09-02 DIAGNOSIS — Z041 Encounter for examination and observation following transport accident: Secondary | ICD-10-CM | POA: Diagnosis not present

## 2022-09-02 DIAGNOSIS — Z9889 Other specified postprocedural states: Secondary | ICD-10-CM | POA: Diagnosis not present

## 2022-09-02 DIAGNOSIS — M7989 Other specified soft tissue disorders: Secondary | ICD-10-CM | POA: Diagnosis not present

## 2022-09-02 DIAGNOSIS — Z23 Encounter for immunization: Secondary | ICD-10-CM | POA: Insufficient documentation

## 2022-09-02 MED ORDER — HYDROCODONE-ACETAMINOPHEN 5-325 MG PO TABS
1.0000 | ORAL_TABLET | Freq: Four times a day (QID) | ORAL | 0 refills | Status: DC | PRN
Start: 2022-09-02 — End: 2023-06-10

## 2022-09-02 MED ORDER — HYDROCODONE-ACETAMINOPHEN 5-325 MG PO TABS
1.0000 | ORAL_TABLET | Freq: Once | ORAL | Status: AC
  Administered 2022-09-02: 1 via ORAL
  Filled 2022-09-02: qty 1

## 2022-09-02 MED ORDER — LIDOCAINE HCL (PF) 1 % IJ SOLN: 5.0000 mL | Freq: Once | INTRAMUSCULAR | Status: DC

## 2022-09-02 MED ORDER — CEPHALEXIN 500 MG PO CAPS
500.0000 mg | ORAL_CAPSULE | Freq: Once | ORAL | Status: AC
  Administered 2022-09-02: 500 mg via ORAL
  Filled 2022-09-02: qty 1

## 2022-09-02 MED ORDER — CEPHALEXIN 500 MG PO CAPS: 500.0000 mg | ORAL_CAPSULE | Freq: Four times a day (QID) | ORAL | 0 refills | Status: DC

## 2022-09-02 MED ORDER — LIDOCAINE HCL 1 % IJ SOLN: 10.0000 mL | Freq: Once | INTRAMUSCULAR | Status: AC

## 2022-09-02 MED ORDER — TETANUS-DIPHTH-ACELL PERTUSSIS 5-2.5-18.5 LF-MCG/0.5 IM SUSY
0.5000 mL | PREFILLED_SYRINGE | Freq: Once | INTRAMUSCULAR | Status: AC
  Administered 2022-09-02: 0.5 mL via INTRAMUSCULAR
  Filled 2022-09-02: qty 0.5

## 2022-09-02 MED ORDER — LIDOCAINE HCL 1 % IJ SOLN
INTRAMUSCULAR | Status: AC
  Filled 2022-09-02: qty 10

## 2022-09-02 NOTE — ED Notes (Signed)
Patient wound to R hand cleansed by RN. Pt now letting water run over wound in sink per MD instruction. Laceration to chin cleansed and closed with dermabond by physician.

## 2022-09-02 NOTE — ED Notes (Signed)
Patient discharged at this time. Ambulated to lobby with independent and steady gait. Breathing unlabored speaking in full sentences. Verbalized understanding of all discharge, follow up, and medication teaching. Discharged homed with all belongings.   

## 2022-09-02 NOTE — ED Notes (Signed)
MD at bedside repairing R hand skin tear

## 2022-09-02 NOTE — ED Notes (Signed)
Vaseline and dry gauze bandage placed to R hand and pinky per provider order

## 2022-09-02 NOTE — ED Triage Notes (Signed)
Pt comes via EMS with c/o fall. Pt did hit head but no loc or thinners. Pt has lac to right hand and chin.  VSS

## 2022-09-02 NOTE — ED Provider Notes (Signed)
[facility] Provider Note    Event Date/Time   First MD Initiated Contact with Patient 09/02/22 1915     (approximate)   History   Fall   HPI  Jocelyn Hooper is a 81 y.o. female   history of asthma, thrombocytopenia, sciatica, scoliosis  Just prior to arrival she was walking to pick up barbecue at a parking lot.  She stumbled over the parking barrier falling forward striking her chest and right hand on the ground and also her forehead.  No loss of consciousness.  No chest pain except she feels like when she takes of breath she has some pain over the front of her breastbone.  No trouble breathing.  No injury to the abdomen.  Did not lose consciousness, no neck or back pain.  She does have of this injury to her right hand on the backside also small cut on the lateral right fifth finger.    Review of records from July 5 of this year denotes a history of peripheral vascular disease.  Primary care notes do note a history of congestive heart failure, scoliosis, previous kidney stones, urinary incontinence, and neuropathy  Does not use any anticoagulants  Physical Exam   Triage Vital Signs: ED Triage Vitals  Enc Vitals Group     BP 09/02/22 1618 129/76     Pulse Rate 09/02/22 1618 77     Resp 09/02/22 1618 20     Temp 09/02/22 1618 98 F (36.7 C)     Temp Source 09/02/22 1618 Oral     SpO2 09/02/22 1618 100 %     Weight --      Height --      Head Circumference --      Peak Flow --      Pain Score 09/02/22 1611 5     Pain Loc --      Pain Edu? --      Excl. in [place]? --     Most recent vital signs: Vitals:   09/02/22 1618  BP: 129/76  Pulse: 77  Resp: 20  Temp: 98 F (36.7 C)  SpO2: 100%     General: Awake, no distress.  Normocephalic atraumatic except for a moderate-sized hematoma overlying the forehead without active bleeding or laceration. CV:  Good peripheral perfusion.  Normal tones No cervical thoracic or lumbar tenderness.  No  deformity Resp:  Normal effort.  Clear bilaterally.  Able to take deep breaths reports with deep breathing that she has some tenderness across the front of her breastbone.  No bruising or deformity. Abd:  No distention.  Soft nontender nondistended Other:    Additionally has a small 2 cm laceration over the mentum of the chin  Examination of long bones show no long bone tenderness or injuries to the major joints.  Her right hand is warm well-perfused in all fingers but over the dorsal surface of the right hand she has an area approximately 3 x 4 cm of extensive skin tear and a very small laceration overlying the right fifth digit extensor tendon without tendon injury.  She demonstrates normal range of motion of all fingers of the right hand and normal median ulnar and radial neurologic examination.  She has strong right radial pulse.  There is a 6 laceration overlying the tendon that is approximately 1 cm in length and she has very little overlying dermal tissue.  No active bleeding.  Also an approximately 2 cm superficial but slightly separated  laceration over the lateral aspect of the right fifth digit interphalangeal joint evidence of significant depth and on evaluation no foreign body or evidence of joint involvement of either of her wounds   ED Results / Procedures / Treatments   Labs (all labs ordered are listed, but only abnormal results are displayed) Labs Reviewed - No data to display   EKG     RADIOLOGY  CT head interpreted by me as negative for acute intracranial hemorrhage, does appear to have some edema overlying the frontal bones  CT HEAD WO CONTRAST (5MM)  Result Date: 09/02/2022 CLINICAL DATA:  Fall EXAM: CT HEAD WITHOUT CONTRAST CT CERVICAL SPINE WITHOUT CONTRAST TECHNIQUE: Multidetector CT imaging of the head and cervical spine was performed following the standard protocol without intravenous contrast. Multiplanar CT image reconstructions of the cervical spine were also  generated. RADIATION DOSE REDUCTION: This exam was performed according to the departmental dose-optimization program which includes automated exposure control, adjustment of the mA and/or kV according to patient size and/or use of iterative reconstruction technique. COMPARISON:  None Available. FINDINGS: CT HEAD FINDINGS Brain: No evidence of acute infarction, hemorrhage, hydrocephalus, extra-axial collection or mass lesion/mass effect. Vascular: No hyperdense vessel or unexpected calcification. Skull: There is a soft tissue hematoma along the midline frontal scalp. Is no evidence of an underlying calvarial fracture. Sinuses/Orbits: Bilateral lens replacement. There are postsurgical changes from bilateral ethmoidectomies, sphenoidotomies, and uncinate. There also osseous changes suggestive of chronic ethmoid and sphenoid sinusitis. Other: None CT CERVICAL SPINE FINDINGS Alignment: There is straightening of the normal cervical lordosis. Skull base and vertebrae: No acute fracture. No primary bone lesion or focal pathologic process. There is fusion of the facet joints at C2-C3 on the left. Soft tissues and spinal canal: No prevertebral fluid or swelling. No visible canal hematoma. Disc levels:  No evidence of high-grade spinal canal stenosis. Upper chest: Negative. Other: History of thyroid gland. IMPRESSION: 1. No acute intracranial abnormality. 2. Soft tissue hematoma along the midline frontal scalp. No evidence of an underlying calvarial fracture. 3. No acute cervical spine fracture. Electronically Signed   By: Marin Roberts M.D.   On: 09/02/2022 16:43   CT Cervical Spine Wo Contrast  Result Date: 09/02/2022 CLINICAL DATA:  Fall EXAM: CT HEAD WITHOUT CONTRAST CT CERVICAL SPINE WITHOUT CONTRAST TECHNIQUE: Multidetector CT imaging of the head and cervical spine was performed following the standard protocol without intravenous contrast. Multiplanar CT image reconstructions of the cervical spine were also  generated. RADIATION DOSE REDUCTION: This exam was performed according to the departmental dose-optimization program which includes automated exposure control, adjustment of the mA and/or kV according to patient size and/or use of iterative reconstruction technique. COMPARISON:  None Available. FINDINGS: CT HEAD FINDINGS Brain: No evidence of acute infarction, hemorrhage, hydrocephalus, extra-axial collection or mass lesion/mass effect. Vascular: No hyperdense vessel or unexpected calcification. Skull: There is a soft tissue hematoma along the midline frontal scalp. Is no evidence of an underlying calvarial fracture. Sinuses/Orbits: Bilateral lens replacement. There are postsurgical changes from bilateral ethmoidectomies, sphenoidotomies, and uncinate. There also osseous changes suggestive of chronic ethmoid and sphenoid sinusitis. Other: None CT CERVICAL SPINE FINDINGS Alignment: There is straightening of the normal cervical lordosis. Skull base and vertebrae: No acute fracture. No primary bone lesion or focal pathologic process. There is fusion of the facet joints at C2-C3 on the left. Soft tissues and spinal canal: No prevertebral fluid or swelling. No visible canal hematoma. Disc levels:  No evidence of high-grade spinal canal stenosis.  Upper chest: Negative. Other: History of thyroid gland. IMPRESSION: 1. No acute intracranial abnormality. 2. Soft tissue hematoma along the midline frontal scalp. No evidence of an underlying calvarial fracture. 3. No acute cervical spine fracture. Electronically Signed   By: Marin Roberts M.D.   On: 09/02/2022 16:43     No nasal deformity.  No septal hematomas no epistaxis      PROCEDURES:  Critical Care performed: No  ..Laceration Repair  Date/Time: 09/02/2022 10:01 PM  Performed by: Delman Kitten, MD Authorized by: Delman Kitten, MD   Consent:    Consent obtained:  Verbal   Consent given by:  Patient   Risks, benefits, and alternatives were discussed: yes      Risks discussed:  Infection, pain, retained foreign body, poor cosmetic result and need for additional repair (possible need for grafting)   Alternatives discussed:  Referral (orthopedics evaluation) Universal protocol:    Procedure explained and questions answered to patient or proxy's satisfaction: yes     Relevant documents present and verified: yes     Test results available: yes     Imaging studies available: yes     Patient identity confirmed:  Verbally with patient and arm band Laceration details:    Location:  Hand   Length (cm):  1   Depth (mm):  3 Pre-procedure details:    Preparation:  Patient was prepped and draped in usual sterile fashion and imaging obtained to evaluate for foreign bodies Exploration:    Limited defect created (wound extended): yes     Imaging obtained: x-ray     Imaging outcome: foreign body not noted     Wound exploration: wound explored through full range of motion and entire depth of wound visualized     Wound extent: fascia violated     Wound extent: no foreign body, no signs of injury, no nerve damage, no tendon damage, no underlying fracture and no vascular damage     Contaminated: no   Treatment:    Area cleansed with:  Povidone-iodine   Amount of cleaning:  Extensive   Irrigation solution:  Tap water and sterile saline   Irrigation volume:  3 minutes of soap and tap water, iodine application prior to closure, and tap water   Irrigation method:  Tap and syringe   Visualized foreign bodies/material removed: no     Debridement:  Minimal   Undermining:  None   Scar revision: no   Skin repair:    Repair method:  Sutures   Number of sutures:  2 (prolene) Approximation:    Approximation:  Close Repair type:    Repair type:  Intermediate Post-procedure details:    Dressing:  Non-adherent dressing and sterile dressing   Procedure completion:  Tolerated well, no immediate complications .Marland KitchenLaceration Repair  Date/Time: 09/02/2022 10:04  PM  Performed by: Delman Kitten, MD Authorized by: Delman Kitten, MD   Consent:    Consent obtained:  Verbal   Consent given by:  Patient   Risks, benefits, and alternatives were discussed: yes     Risks discussed:  Infection and poor wound healing Universal protocol:    Procedure explained and questions answered to patient or proxy's satisfaction: yes     Relevant documents present and verified: yes     Test results available: yes     Imaging studies available: yes     Patient identity confirmed:  Verbally with patient and arm band Anesthesia:    Anesthesia method:  None Laceration details:  Location:  Face   Length (cm):  2   Depth (mm):  5 Pre-procedure details:    Preparation:  Patient was prepped and draped in usual sterile fashion Exploration:    Limited defect created (wound extended): no     Imaging outcome: foreign body not noted     Wound exploration: wound explored through full range of motion     Wound extent: fascia not violated, no foreign body, no signs of injury, no nerve damage, no tendon damage, no underlying fracture and no vascular damage     Contaminated: no   Treatment:    Area cleansed with:  Saline and soap and water   Amount of cleaning:  Standard   Irrigation solution:  Sterile saline   Visualized foreign bodies/material removed: no     Debridement:  None Skin repair:    Repair method:  Tissue adhesive Approximation:    Approximation:  Close Repair type:    Repair type:  Simple Post-procedure details:    Dressing:  Open (no dressing)   Procedure completion:  Tolerated well, no immediate complications .Marland KitchenLaceration Repair  Date/Time: 09/02/2022 10:05 PM  Performed by: Delman Kitten, MD Authorized by: Delman Kitten, MD   Consent:    Consent obtained:  Verbal   Consent given by:  Patient   Risks discussed:  Infection, pain, poor cosmetic result and poor wound healing Universal protocol:    Procedure explained and questions answered to patient or  proxy's satisfaction: yes     Relevant documents present and verified: yes     Test results available: yes     Imaging studies available: yes     Required blood products, implants, devices, and special equipment available: yes     Patient identity confirmed:  Verbally with patient and arm band Anesthesia:    Anesthesia method:  None Laceration details:    Location:  Finger   Finger location:  R small finger   Length (cm):  3   Depth (mm):  2 Pre-procedure details:    Preparation:  Imaging obtained to evaluate for foreign bodies Exploration:    Limited defect created (wound extended): no     Imaging obtained: x-ray     Imaging outcome: foreign body not noted     Wound exploration: wound explored through full range of motion     Wound extent: areolar tissue not violated, fascia not violated, no foreign body, no signs of injury, no nerve damage, no tendon damage, no underlying fracture and no vascular damage     Contaminated: no   Treatment:    Area cleansed with:  Povidone-iodine   Amount of cleaning:  Extensive   Irrigation solution:  Tap water and sterile saline   Irrigation method:  Tap   Visualized foreign bodies/material removed: no     Debridement:  None   Undermining:  None   Scar revision: no   Skin repair:    Repair method:  Tissue adhesive Approximation:    Approximation:  Close Repair type:    Repair type:  Simple Comments:     Patient missing some of the skin which was torn off, utilized Dermabond.      MEDICATIONS ORDERED IN ED: Medications  lidocaine (XYLOCAINE) 1 % (with pres) injection 10 mL ( Infiltration Given by Other 09/02/22 1956)  Tdap (BOOSTRIX) injection 0.5 mL (0.5 mLs Intramuscular Given 09/02/22 2106)  cephALEXin (KEFLEX) capsule 500 mg (500 mg Oral Given 09/02/22 2106)  HYDROcodone-acetaminophen (NORCO/VICODIN) 5-325 MG per tablet 1 tablet (  1 tablet Oral Given 09/02/22 2106)     IMPRESSION / MDM / ASSESSMENT AND PLAN / ED COURSE  I  reviewed the triage vital signs and the nursing notes.                              Differential diagnosis includes, but is not limited to, injuries from a mechanical fall laceration intracranial hemorrhage, trauma, chest trauma rib fracture sternal fractures lacerations abrasions skin tears etc.  Normal health no preceding symptoms or signs that would be suggestive of a medical cause for her fall it appears that she tripped over the parking barrier  Patient's presentation is most consistent with acute complicated illness / injury requiring diagnostic workup.   Given the patient's hand injury that had exposed tendon, placed on cephalexin, given tetanus shot, and I have discussed her case with her orthopedic surgeon Dr. Carolan Clines.  He will have his clinic call to set up close follow-up with orthopedics for wound healing.  Discussed with patient, her husband, and her daughter at the bedside careful return precautions, wound care, treatment recommendations and follow-up.  She reports improvement with hydrocodone and has taken and tolerated this in the past.  Will use only as prescribed and not drive while taking.  Return precautions and treatment recommendations and follow-up discussed with the patient who is agreeable with the plan.        FINAL CLINICAL IMPRESSION(S) / ED DIAGNOSES   Final diagnoses:  Fall, initial encounter  Injury of head, initial encounter  Traumatic hematoma of forehead, initial encounter  Laceration of dorsum of hand  Chin laceration, initial encounter  Laceration of right little finger without foreign body without damage to nail, initial encounter     Rx / DC Orders   ED Discharge Orders          Ordered    HYDROcodone-acetaminophen (NORCO/VICODIN) 5-325 MG tablet  Every 6 hours PRN        09/02/22 2154    cephALEXin (KEFLEX) 500 MG capsule  4 times daily        09/02/22 2154             Note:  This document was prepared using Dragon voice  recognition software and may include unintentional dictation errors.   Delman Kitten, MD 09/02/22 2208

## 2022-09-02 NOTE — Discharge Instructions (Signed)
Please follow-up with orthopedics.  Please call their clinic to set up a follow-up appointment.  You should have a visit with them within the next week.  You have 2 sutures that will require removal in approximately 7 to 10 days.  Utilize antibiotic to prevent infection, and do not drive while taking hydrocodone.  Return to the emergency room right away if you notice high fever, severe pain, swelling, redness or drainage from any of your wounds or injuries.

## 2022-09-10 DIAGNOSIS — M25571 Pain in right ankle and joints of right foot: Secondary | ICD-10-CM | POA: Diagnosis not present

## 2022-09-10 DIAGNOSIS — M25561 Pain in right knee: Secondary | ICD-10-CM | POA: Diagnosis not present

## 2022-09-16 DIAGNOSIS — Z961 Presence of intraocular lens: Secondary | ICD-10-CM | POA: Diagnosis not present

## 2022-09-16 DIAGNOSIS — H532 Diplopia: Secondary | ICD-10-CM | POA: Diagnosis not present

## 2022-09-17 DIAGNOSIS — L03115 Cellulitis of right lower limb: Secondary | ICD-10-CM | POA: Diagnosis not present

## 2022-09-17 DIAGNOSIS — R0781 Pleurodynia: Secondary | ICD-10-CM | POA: Diagnosis not present

## 2022-09-17 DIAGNOSIS — I5041 Acute combined systolic (congestive) and diastolic (congestive) heart failure: Secondary | ICD-10-CM | POA: Diagnosis not present

## 2022-09-17 DIAGNOSIS — I38 Endocarditis, valve unspecified: Secondary | ICD-10-CM | POA: Diagnosis not present

## 2022-09-17 DIAGNOSIS — M7041 Prepatellar bursitis, right knee: Secondary | ICD-10-CM | POA: Diagnosis not present

## 2022-09-24 DIAGNOSIS — M7651 Patellar tendinitis, right knee: Secondary | ICD-10-CM | POA: Diagnosis not present

## 2022-09-24 DIAGNOSIS — Z96651 Presence of right artificial knee joint: Secondary | ICD-10-CM | POA: Diagnosis not present

## 2022-09-24 DIAGNOSIS — M25561 Pain in right knee: Secondary | ICD-10-CM | POA: Diagnosis not present

## 2022-10-20 DIAGNOSIS — I5041 Acute combined systolic (congestive) and diastolic (congestive) heart failure: Secondary | ICD-10-CM | POA: Diagnosis not present

## 2022-10-20 DIAGNOSIS — E538 Deficiency of other specified B group vitamins: Secondary | ICD-10-CM | POA: Diagnosis not present

## 2022-10-20 DIAGNOSIS — I38 Endocarditis, valve unspecified: Secondary | ICD-10-CM | POA: Diagnosis not present

## 2022-10-20 DIAGNOSIS — E782 Mixed hyperlipidemia: Secondary | ICD-10-CM | POA: Diagnosis not present

## 2022-10-27 DIAGNOSIS — E538 Deficiency of other specified B group vitamins: Secondary | ICD-10-CM | POA: Diagnosis not present

## 2022-10-27 DIAGNOSIS — Z0000 Encounter for general adult medical examination without abnormal findings: Secondary | ICD-10-CM | POA: Diagnosis not present

## 2022-10-27 DIAGNOSIS — E782 Mixed hyperlipidemia: Secondary | ICD-10-CM | POA: Diagnosis not present

## 2022-12-02 DIAGNOSIS — L570 Actinic keratosis: Secondary | ICD-10-CM | POA: Diagnosis not present

## 2022-12-02 DIAGNOSIS — D225 Melanocytic nevi of trunk: Secondary | ICD-10-CM | POA: Diagnosis not present

## 2022-12-02 DIAGNOSIS — D2261 Melanocytic nevi of right upper limb, including shoulder: Secondary | ICD-10-CM | POA: Diagnosis not present

## 2022-12-02 DIAGNOSIS — L821 Other seborrheic keratosis: Secondary | ICD-10-CM | POA: Diagnosis not present

## 2022-12-02 DIAGNOSIS — D2262 Melanocytic nevi of left upper limb, including shoulder: Secondary | ICD-10-CM | POA: Diagnosis not present

## 2022-12-02 DIAGNOSIS — D2272 Melanocytic nevi of left lower limb, including hip: Secondary | ICD-10-CM | POA: Diagnosis not present

## 2022-12-02 DIAGNOSIS — D2271 Melanocytic nevi of right lower limb, including hip: Secondary | ICD-10-CM | POA: Diagnosis not present

## 2022-12-02 DIAGNOSIS — L814 Other melanin hyperpigmentation: Secondary | ICD-10-CM | POA: Diagnosis not present

## 2022-12-21 DIAGNOSIS — G8929 Other chronic pain: Secondary | ICD-10-CM | POA: Diagnosis not present

## 2022-12-21 DIAGNOSIS — M5442 Lumbago with sciatica, left side: Secondary | ICD-10-CM | POA: Diagnosis not present

## 2022-12-21 DIAGNOSIS — M47816 Spondylosis without myelopathy or radiculopathy, lumbar region: Secondary | ICD-10-CM | POA: Diagnosis not present

## 2022-12-21 DIAGNOSIS — M7062 Trochanteric bursitis, left hip: Secondary | ICD-10-CM | POA: Diagnosis not present

## 2023-01-04 ENCOUNTER — Other Ambulatory Visit: Payer: Self-pay | Admitting: Family Medicine

## 2023-01-04 DIAGNOSIS — M48062 Spinal stenosis, lumbar region with neurogenic claudication: Secondary | ICD-10-CM | POA: Diagnosis not present

## 2023-01-04 DIAGNOSIS — M5416 Radiculopathy, lumbar region: Secondary | ICD-10-CM

## 2023-01-04 DIAGNOSIS — M4807 Spinal stenosis, lumbosacral region: Secondary | ICD-10-CM | POA: Diagnosis not present

## 2023-01-04 DIAGNOSIS — M62830 Muscle spasm of back: Secondary | ICD-10-CM | POA: Diagnosis not present

## 2023-01-04 DIAGNOSIS — M5136 Other intervertebral disc degeneration, lumbar region: Secondary | ICD-10-CM | POA: Diagnosis not present

## 2023-01-11 ENCOUNTER — Ambulatory Visit
Admission: RE | Admit: 2023-01-11 | Discharge: 2023-01-11 | Disposition: A | Discharge status: Home / Self Care | Payer: PPO | Source: Ambulatory Visit | Attending: Family Medicine | Admitting: Family Medicine

## 2023-01-11 DIAGNOSIS — M5416 Radiculopathy, lumbar region: Secondary | ICD-10-CM | POA: Diagnosis not present

## 2023-01-11 DIAGNOSIS — M545 Low back pain, unspecified: Secondary | ICD-10-CM | POA: Diagnosis not present

## 2023-01-21 DIAGNOSIS — M5416 Radiculopathy, lumbar region: Secondary | ICD-10-CM | POA: Diagnosis not present

## 2023-01-21 DIAGNOSIS — M48062 Spinal stenosis, lumbar region with neurogenic claudication: Secondary | ICD-10-CM | POA: Diagnosis not present

## 2023-02-11 ENCOUNTER — Ambulatory Visit: Payer: PPO | Admitting: Cardiovascular Disease

## 2023-02-15 ENCOUNTER — Ambulatory Visit: Payer: PPO | Admitting: Podiatry

## 2023-02-15 DIAGNOSIS — M5416 Radiculopathy, lumbar region: Secondary | ICD-10-CM | POA: Diagnosis not present

## 2023-02-15 DIAGNOSIS — M48062 Spinal stenosis, lumbar region with neurogenic claudication: Secondary | ICD-10-CM | POA: Diagnosis not present

## 2023-02-15 DIAGNOSIS — M5136 Other intervertebral disc degeneration, lumbar region: Secondary | ICD-10-CM | POA: Diagnosis not present

## 2023-02-24 ENCOUNTER — Encounter: Payer: Self-pay | Admitting: Podiatry

## 2023-02-24 ENCOUNTER — Ambulatory Visit: Payer: PPO | Admitting: Podiatry

## 2023-02-24 DIAGNOSIS — B351 Tinea unguium: Secondary | ICD-10-CM | POA: Diagnosis not present

## 2023-02-24 DIAGNOSIS — M79676 Pain in unspecified toe(s): Secondary | ICD-10-CM | POA: Diagnosis not present

## 2023-02-24 NOTE — Progress Notes (Signed)
She presents today chief complaint of painful elongated toenails 1 through 5 bilaterally.  Objective: Vital signs stable alert oriented x 3 toenails are long thick yellow dystrophic onychomycotic painful palpation as well as debridement.  Assessment: Pain in limb secondary to onychomycosis 1 through 5 bilateral.  Plan: Debridement of toenails 1 through 5 bilateral covered service secondary to pain.

## 2023-03-02 DIAGNOSIS — M5417 Radiculopathy, lumbosacral region: Secondary | ICD-10-CM | POA: Diagnosis not present

## 2023-03-02 DIAGNOSIS — M5136 Other intervertebral disc degeneration, lumbar region: Secondary | ICD-10-CM | POA: Diagnosis not present

## 2023-03-02 DIAGNOSIS — M4317 Spondylolisthesis, lumbosacral region: Secondary | ICD-10-CM | POA: Diagnosis not present

## 2023-03-02 DIAGNOSIS — M4807 Spinal stenosis, lumbosacral region: Secondary | ICD-10-CM | POA: Diagnosis not present

## 2023-03-19 DIAGNOSIS — L609 Nail disorder, unspecified: Secondary | ICD-10-CM | POA: Diagnosis not present

## 2023-03-19 DIAGNOSIS — C44729 Squamous cell carcinoma of skin of left lower limb, including hip: Secondary | ICD-10-CM | POA: Diagnosis not present

## 2023-03-19 DIAGNOSIS — D485 Neoplasm of uncertain behavior of skin: Secondary | ICD-10-CM | POA: Diagnosis not present

## 2023-03-19 DIAGNOSIS — R208 Other disturbances of skin sensation: Secondary | ICD-10-CM | POA: Diagnosis not present

## 2023-04-05 DIAGNOSIS — D2372 Other benign neoplasm of skin of left lower limb, including hip: Secondary | ICD-10-CM | POA: Diagnosis not present

## 2023-04-05 DIAGNOSIS — C44729 Squamous cell carcinoma of skin of left lower limb, including hip: Secondary | ICD-10-CM | POA: Diagnosis not present

## 2023-04-12 DIAGNOSIS — M5442 Lumbago with sciatica, left side: Secondary | ICD-10-CM | POA: Diagnosis not present

## 2023-04-12 DIAGNOSIS — R2689 Other abnormalities of gait and mobility: Secondary | ICD-10-CM | POA: Diagnosis not present

## 2023-04-12 DIAGNOSIS — M25552 Pain in left hip: Secondary | ICD-10-CM | POA: Diagnosis not present

## 2023-04-12 DIAGNOSIS — M6281 Muscle weakness (generalized): Secondary | ICD-10-CM | POA: Diagnosis not present

## 2023-04-14 DIAGNOSIS — M5442 Lumbago with sciatica, left side: Secondary | ICD-10-CM | POA: Diagnosis not present

## 2023-04-14 DIAGNOSIS — M6281 Muscle weakness (generalized): Secondary | ICD-10-CM | POA: Diagnosis not present

## 2023-04-14 DIAGNOSIS — R2689 Other abnormalities of gait and mobility: Secondary | ICD-10-CM | POA: Diagnosis not present

## 2023-04-14 DIAGNOSIS — M25552 Pain in left hip: Secondary | ICD-10-CM | POA: Diagnosis not present

## 2023-04-14 NOTE — Progress Notes (Unsigned)
Cardiology Office Note  Date:  04/15/2023   ID:  Jocelyn Hooper, DOB 09/04/1941, MRN [id number]  PCP:  Jocelyn Penton, MD   Chief Complaint  Patient presents with   New Patient (Initial Visit)    Establish care for ASCVD. Medications reviewed by the patient verbally. Patient c/o shortness of breath with over exertion and chest discomfort at times.     HPI:  [name] is a 82 year old woman with  hyperlipidemia,  degenerative disc disease, chronic back pain GERD, IBS, history of chest pain with normal cardiac catheterization may 2003 at [facility],  previously seen in our clinic for leg swelling,  Leg neuropathy Previous symptoms of shortness of breath, who presents by referral from Dr. Hyacinth Hooper for leg swelling, shortness of breath  Last seen by myself in clinic September 2019 On discussion today, reports that she has worsening back pain Followed by neurosurgery in [place], Participated in physical therapy yesterday, feels the activity made her pain worse  Reports her ambulation limited Married, husband helps Gabapentin at night for pain  On PPI, denies GERD symptoms  Leg edema, come and goes, Minimal edema on today's visit Blood pressure low, denies orthostasis symptoms, not on blood pressure medication  Not much SOB, Breathing "fair"  denies chest pain concerning for angina  Lab work reviewed Total chol 178, LDL 91  Prior imaging reviewed Echocardiogram January 2018 EF 55 to 60%  Echo 7/21: [facility] NORMAL LEFT VENTRICULAR SYSTOLIC FUNCTION  NORMAL RIGHT VENTRICULAR SYSTOLIC FUNCTION  MILD VALVULAR REGURGITATION (See above)  NO VALVULAR STENOSIS  Mild/mod MR   CT chest 12/23 Moderate hiatal hernia. Aortic Atherosclerosis  CT coronary calcium score Score of 80  EKG personally reviewed by myself on todays visit EKG Interpretation Date/Time:  Thursday April 15 2023 15:23:11 EDT Ventricular Rate:  74 PR Interval:  200 QRS Duration:  64 QT  Interval:  406 QTC Calculation: 450 R Axis:   -10  Text Interpretation: Normal sinus rhythm Normal ECG When compared with ECG of 16-Sep-2017 10:49, No significant change was found Confirmed by Jocelyn Hooper [phone]) on 04/15/2023 3:33:28 PM    PMH:   has a past medical history of Bursitis of left shoulder, Chest pain, unspecified, Chronic kidney disease, Complication of anesthesia, Coronary artery disease, CSF leak, DDD (degenerative disc disease), cervical, Diverticulosis, Esophageal reflux, Fibrocystic breast disease, GERD (gastroesophageal reflux disease), Headache, History of colon polyps, History of hiatal hernia, History of kidney stones, IBS (irritable bowel syndrome), Nasal polyps, Other and unspecified hyperlipidemia, Scoliosis, Tinnitus of both ears, Trigeminal neuralgia syndrome, Unspecified sinusitis (chronic), and Vitamin B12 deficiency.  PSH:    Past Surgical History:  Procedure Laterality Date   ABDOMINAL HYSTERECTOMY     APPENDECTOMY     BACK SURGERY  1982   disc   CARDIAC CATHETERIZATION     [facility]    CATARACT EXTRACTION     left    CATARACT EXTRACTION     right   CHOLECYSTECTOMY  2002   COLONOSCOPY WITH PROPOFOL N/A 03/10/2017   Procedure: COLONOSCOPY WITH PROPOFOL;  Surgeon: Jocelyn Jun, MD;  Location[facility] ENDOSCOPY;  Service: Endoscopy;  Laterality: N/A;   COLONOSCOPY WITH PROPOFOL N/A 01/13/2021   Procedure: COLONOSCOPY WITH PROPOFOL;  Surgeon: Hooper, Jocelyn Nearing, MD;  Location: [facility] ENDOSCOPY;  Service: Gastroenterology;  Laterality: N/A;   ESOPHAGOGASTRODUODENOSCOPY (EGD) WITH PROPOFOL N/A 01/13/2021   Procedure: ESOPHAGOGASTRODUODENOSCOPY (EGD) WITH PROPOFOL;  Surgeon: Hooper, Jocelyn Nearing, MD;  Location: [facility] ENDOSCOPY;  Service: Gastroenterology;  Laterality: N/A;   EXTRACORPOREAL SHOCK WAVE LITHOTRIPSY Right 01/09/2021   Procedure: EXTRACORPOREAL SHOCK WAVE LITHOTRIPSY (ESWL);  Surgeon: Jocelyn Scotland, MD;  Location: [facility] ORS;  Service: Urology;   Laterality: Right;   EYE SURGERY Bilateral    Cataract Extraction with IOL   hysterectomy (other)  1974   JOINT REPLACEMENT     LEFT knee   KIDNEY STONE SURGERY     KNEE ARTHROPLASTY Right 09/27/2017   Procedure: COMPUTER ASSISTED TOTAL KNEE ARTHROPLASTY;  Surgeon: Jocelyn Heinz, MD;  Location: [facility] ORS;  Service: Orthopedics;  Laterality: Right;   KNEE ARTHROSCOPY Right 09/13/2015   Procedure: ARTHROSCOPY KNEE, PARTIAL LATERAL MENISECTOMY, CHONDROPLASTY ;  Surgeon: Jocelyn Heinz, MD;  Location: [facility] ORS;  Service: Orthopedics;  Laterality: Right;   KNEE SURGERY  2004   having a lot of problems with left knee   NASAL SINUS SURGERY  1967, 1988, 1993, 2003, 2012   sinus procedures  1970,1990,1994,2005   TONSILLECTOMY      Current Outpatient Medications  Medication Sig Dispense Refill   aspirin EC 81 MG tablet Take 81 mg by mouth daily.     Calcium Carbonate-Vitamin D 600-400 MG-UNIT tablet Take 1 tablet by mouth 2 (two) times daily.     Docusate Calcium (STOOL SOFTENER PO) Take 1 tablet by mouth daily.      fluticasone (FLONASE) 50 MCG/ACT nasal spray Place 2 sprays into both nostrils daily.      gabapentin (NEURONTIN) 300 MG capsule Take 300 mg by mouth at bedtime.      HYDROcodone-acetaminophen (NORCO/VICODIN) 5-325 MG tablet Take 1 tablet by mouth every 6 (six) hours as needed for moderate pain. 12 tablet 0   IBU 600 MG tablet Take 600 mg by mouth 3 (three) times daily as needed.     lansoprazole (PREVACID) 30 MG capsule Take 30 mg by mouth daily.     ondansetron (ZOFRAN-ODT) 4 MG disintegrating tablet Take 4 mg by mouth every 8 (eight) hours as needed.     sucralfate (CARAFATE) 1 g tablet Take 1 g by mouth 4 (four) times daily.     topiramate (TOPAMAX) 50 MG tablet Take by mouth.     vitamin C (ASCORBIC ACID) 500 MG tablet Take 1,000 mg by mouth 2 (two) times daily.     cephALEXin (KEFLEX) 500 MG capsule Take 1 capsule (500 mg total) by mouth 4 (four) times daily. (Patient not  taking: Reported on 04/15/2023) 40 capsule 0   estradiol (ESTRACE) 0.5 MG tablet Take 0.5 mg by mouth daily. (Patient not taking: Reported on 02/19/2022)     metoCLOPramide (REGLAN) 5 MG tablet 1 tab in the morning, 1 tab at bedtime (Patient not taking: Reported on 01/27/2022)     MYRBETRIQ 25 MG TB24 tablet TAKE 1 TABLET BY MOUTH DAILY (Patient not taking: Reported on 04/15/2023) 90 tablet 3   NEOMYCIN-POLYMYXIN-HYDROCORTISONE (CORTISPORIN) 1 % SOLN OTIC solution Apply 1-2 drops to toe BID after soaking (Patient not taking: Reported on 01/27/2022) 10 mL 1   nitroGLYCERIN (NITRO-DUR) 0.2 mg/hr patch Place 1 patch (0.2 mg total) onto the skin daily. (Patient not taking: Reported on 04/15/2023) 30 patch 12   No current facility-administered medications for this visit.    Allergies:   Avelox [moxifloxacin hcl in nacl], Codeine, Acyclovir and related, Shrimp [shellfish allergy], Ciprofloxacin, Moxifloxacin, and Sucralfate   Social History:  The patient  reports that she has never smoked. She has never used smokeless tobacco. She reports that she does not drink  alcohol and does not use drugs.   Family History:   family history includes Breast cancer (age of onset: 1) in her sister; Heart attack in her father; Heart attack (age of onset: 49) in her brother; Heart attack (age of onset: 12) in her mother; Heart disease in her father; Heart failure in her mother.    Review of Systems: Review of Systems  Constitutional: Negative.   HENT: Negative.    Respiratory: Negative.    Cardiovascular: Negative.   Gastrointestinal: Negative.   Musculoskeletal: Negative.   Neurological: Negative.   Psychiatric/Behavioral: Negative.    All other systems reviewed and are negative.    PHYSICAL EXAM: VS:  BP 110/64 (BP Location: Right Arm, Patient Position: Sitting, Cuff Size: Normal)   Pulse 74   Ht 5\' 5"  (1.651 m)   Wt 135 lb 4 oz (61.3 kg)   SpO2 97%   BMI 22.51 kg/m  , BMI Body mass index is 22.51  kg/m. Constitutional:  oriented to person, place, and time. No distress.  HENT:  Head: Grossly normal Eyes:  no discharge. No scleral icterus.  Neck: No JVD, no carotid bruits  Cardiovascular: Regular rate and rhythm, no murmurs appreciated Pulmonary/Chest: Clear to auscultation bilaterally, no wheezes or rails Abdominal: Soft.  no distension.  no tenderness.  Musculoskeletal: Normal range of motion Neurological:  normal muscle tone. Coordination normal. No atrophy Skin: Skin warm and dry Psychiatric: normal affect, pleasant  Recent Labs: No results found for requested labs within last 365 days.    Lipid Panel Lab Results  Component Value Date   CHOL 198 10/07/2016   HDL 75 10/07/2016   LDLCALC 105 (H) 10/07/2016   TRIG 88 10/07/2016      Wt Readings from Last 3 Encounters:  04/15/23 135 lb 4 oz (61.3 kg)  01/27/22 135 lb (61.2 kg)  01/24/21 131 lb (59.4 kg)     ASSESSMENT AND PLAN:  Problem List Items Addressed This Visit       Cardiology Problems   Mixed hyperlipidemia   ASCVD (arteriosclerotic cardiovascular disease) - Primary   Relevant Orders   EKG 12-Lead (Completed)     Other   Localized edema   Shortness of breath She reports minimal symptoms, Not very active at baseline limited by chronic back pain No significant leg swelling on exam concerning for CHF Denies chest pain concerning for angina Prior echocardiograms and stress test with no acute findings Stress echo July 2021, normal ejection fraction No strong indication for diuretics  Leg edema Reports having occasional leg swelling Minimal swelling noted on today's visit Recommend leg elevation, compression hose as needed Given minimal symptoms, hold off on diuretics  Chest discomfort Denies having significant chest discomfort on exertion concerning for angina Will hold off on stress testing at this time  Chronic back pain Reports symptoms worse after PT yesterday Reports that she may  need surgery   Total encounter time more than 60 minutes  Greater than 50% was spent in counseling and coordination of care with the patient    Signed, Dossie Arbour, M.D., Ph.D. [facility] [place], [place] [phone]

## 2023-04-15 ENCOUNTER — Encounter: Payer: Self-pay | Admitting: Cardiovascular Disease

## 2023-04-15 ENCOUNTER — Ambulatory Visit: Payer: PPO | Attending: Cardiovascular Disease | Admitting: Cardiovascular Disease

## 2023-04-15 VITALS — BP 110/64 | HR 74 | Ht 65.0 in | Wt 135.2 lb

## 2023-04-15 DIAGNOSIS — R600 Localized edema: Secondary | ICD-10-CM | POA: Diagnosis not present

## 2023-04-15 DIAGNOSIS — I2510 Atherosclerotic heart disease of native coronary artery without angina pectoris: Secondary | ICD-10-CM | POA: Diagnosis not present

## 2023-04-15 DIAGNOSIS — E782 Mixed hyperlipidemia: Secondary | ICD-10-CM | POA: Diagnosis not present

## 2023-04-15 NOTE — Patient Instructions (Signed)

## 2023-04-26 NOTE — Progress Notes (Unsigned)
04/27/2023 2:20 PM   Jocelyn Hooper November 12, 1940 [id number]  Referring provider: Danella Penton, MD [phone] [facility] [address] [facility] [place],  [place] [address]  Urological history: 1. Urge incontinence -contributing factors of age, GSM, and lumbar DDD, diuretics and pelvic surgery  2. Nephrolithiasis -stone composition calcium oxalate -ESWL, 12/2020 -CT renal stone study (01/2022) -nonobstructive bilateral nephrolithiasis  3. rUTI's -Contributing factors of age, GSM, incontinence and constipation -Documented urine cultures over the last year   NONE    Chief Complaint  Patient presents with   incomplete emptying   HPI: Jocelyn Hooper is a 82 y.o. female who presents today for feelings of incomplete bladder emptying and urinary frequency.  Previous records reviewed.   She is having 1-7 daytime voids, nocturia x 1-2 and a mild urge to urinate.  She is not leaking.  She engages in toilet mapping.  For the last 2 weeks she has noted an increase in urgency.  She states she feels the urge to urinate goes to the restroom and only voids a tiny amount and then 3 hours later she feels the urge again and has a good void.  She did admit that she does not have enough fluid intake during the day.  She is also having issues with her back and is currently considering back surgery.  She has prescription for Myrbetriq 25 mg, but she only takes it on a as needed basis as it is expensive for her and when she takes the medication she states she goes almost 6 hours without being able to void and this gets uncomfortable for her.  Patient denies any modifying or aggravating factors.  Patient denies any recent UTI's, gross hematuria, dysuria or suprapubic/flank pain.  Patient denies any fevers, chills, nausea or vomiting.   UA yellow slightly cloudy, specific gravity 1.015, pH 7.0, 0-5 WBC's, 0-2 RBC's, > 10 epithelial cells, amorphous sediment present, mucus threads present and few  bacteria.   PVR 0 mL   PMH: Past Medical History:  Diagnosis Date   Bursitis of left shoulder    Chest pain, unspecified    Chronic kidney disease    KIDNEY STONE CURRENTLY-08-2015-SEES DR COPE   Complication of anesthesia    PT HAS SEVERE SCOLIOSIS AND FOR TKR ANESTHESIA COULD NOT GET A SPINAL   Coronary artery disease    ascvd   CSF leak    DDD (degenerative disc disease), cervical    Diverticulosis    Esophageal reflux    Fibrocystic breast disease    GERD (gastroesophageal reflux disease)    Headache    H/O   History of colon polyps    History of hiatal hernia    History of kidney stones    IBS (irritable bowel syndrome)    Nasal polyps    Other and unspecified hyperlipidemia    Scoliosis    Tinnitus of both ears    Trigeminal neuralgia syndrome    Unspecified sinusitis (chronic)    Vitamin B12 deficiency     Surgical History: Past Surgical History:  Procedure Laterality Date   ABDOMINAL HYSTERECTOMY     APPENDECTOMY     BACK SURGERY  1982   disc   CARDIAC CATHETERIZATION     [place]    CATARACT EXTRACTION     left    CATARACT EXTRACTION     right   CHOLECYSTECTOMY  2002   COLONOSCOPY WITH PROPOFOL N/A 03/10/2017   Procedure: COLONOSCOPY WITH PROPOFOL;  Surgeon: Scot Jun, MD;  Location: [facility] ENDOSCOPY;  Service: Endoscopy;  Laterality: N/A;   COLONOSCOPY WITH PROPOFOL N/A 01/13/2021   Procedure: COLONOSCOPY WITH PROPOFOL;  Surgeon: Toledo, Boykin Nearing, MD;  Location: [facility] ENDOSCOPY;  Service: Gastroenterology;  Laterality: N/A;   ESOPHAGOGASTRODUODENOSCOPY (EGD) WITH PROPOFOL N/A 01/13/2021   Procedure: ESOPHAGOGASTRODUODENOSCOPY (EGD) WITH PROPOFOL;  Surgeon: Toledo, Boykin Nearing, MD;  Location: [facility] ENDOSCOPY;  Service: Gastroenterology;  Laterality: N/A;   EXTRACORPOREAL SHOCK WAVE LITHOTRIPSY Right 01/09/2021   Procedure: EXTRACORPOREAL SHOCK WAVE LITHOTRIPSY (ESWL);  Surgeon: Vanna Scotland, MD;  Location: [facility] ORS;  Service: Urology;   Laterality: Right;   EYE SURGERY Bilateral    Cataract Extraction with IOL   hysterectomy (other)  1974   JOINT REPLACEMENT     LEFT knee   KIDNEY STONE SURGERY     KNEE ARTHROPLASTY Right 09/27/2017   Procedure: COMPUTER ASSISTED TOTAL KNEE ARTHROPLASTY;  Surgeon: Donato Heinz, MD;  Location: [facility] ORS;  Service: Orthopedics;  Laterality: Right;   KNEE ARTHROSCOPY Right 09/13/2015   Procedure: ARTHROSCOPY KNEE, PARTIAL LATERAL MENISECTOMY, CHONDROPLASTY ;  Surgeon: Donato Heinz, MD;  Location: [facility] ORS;  Service: Orthopedics;  Laterality: Right;   KNEE SURGERY  2004   having a lot of problems with left knee   NASAL SINUS SURGERY  1967, 1988, 1993, 2003, 2012   sinus procedures  1970,1990,1994,2005   TONSILLECTOMY      Home Medications:  Allergies as of 04/27/2023       Reactions   Avelox [moxifloxacin Hcl In Nacl] Other (See Comments)   Redness all over in about 5 minutes after taking   Codeine Nausea Only   REACTION: nausea   Acyclovir And Related    Shrimp [shellfish Allergy]    Hives    Ciprofloxacin Rash   Moxifloxacin Hives, Rash   Other reaction(s): OTHER   Sucralfate Rash   Itching         Medication List        Accurate as of April 27, 2023  2:20 PM. If you have any questions, ask your nurse or doctor.          STOP taking these medications    Myrbetriq 25 MG Tb24 tablet Generic drug: mirabegron ER Stopped by: Carollee Herter        TAKE these medications    ascorbic acid 500 MG tablet Commonly known as: VITAMIN C Take 1,000 mg by mouth 2 (two) times daily.   aspirin EC 81 MG tablet Take 81 mg by mouth daily.   Calcium Carbonate-Vitamin D 600-400 MG-UNIT tablet Take 1 tablet by mouth 2 (two) times daily.   cephALEXin 500 MG capsule Commonly known as: KEFLEX Take 1 capsule (500 mg total) by mouth 4 (four) times daily.   estradiol 0.5 MG tablet Commonly known as: ESTRACE Take 0.5 mg by mouth daily.   fluticasone 50 MCG/ACT nasal  spray Commonly known as: FLONASE Place 2 sprays into both nostrils daily.   gabapentin 300 MG capsule Commonly known as: NEURONTIN Take 300 mg by mouth at bedtime.   HYDROcodone-acetaminophen 5-325 MG tablet Commonly known as: NORCO/VICODIN Take 1 tablet by mouth every 6 (six) hours as needed for moderate pain.   IBU 600 MG tablet Generic drug: ibuprofen Take 600 mg by mouth 3 (three) times daily as needed.   lansoprazole 30 MG capsule Commonly known as: PREVACID Take 30 mg by mouth daily.   metoCLOPramide 5 MG tablet Commonly known as: REGLAN   NEOMYCIN-POLYMYXIN-HYDROCORTISONE 1 %  Soln OTIC solution Commonly known as: CORTISPORIN Apply 1-2 drops to toe BID after soaking   nitroGLYCERIN 0.2 mg/hr patch Commonly known as: Nitro-Dur Place 1 patch (0.2 mg total) onto the skin daily.   ondansetron 4 MG disintegrating tablet Commonly known as: ZOFRAN-ODT Take 4 mg by mouth every 8 (eight) hours as needed.   oxybutynin 5 MG tablet Commonly known as: DITROPAN Take 1 tablet (5 mg total) by mouth daily. Started by: Michiel Cowboy   STOOL SOFTENER PO Take 1 tablet by mouth daily.   sucralfate 1 g tablet Commonly known as: CARAFATE Take 1 g by mouth 4 (four) times daily.   topiramate 50 MG tablet Commonly known as: TOPAMAX Take by mouth.        Allergies:  Allergies  Allergen Reactions   Avelox [Moxifloxacin Hcl In Nacl] Other (See Comments)    Redness all over in about 5 minutes after taking   Codeine Nausea Only    REACTION: nausea   Acyclovir And Related    Shrimp [Shellfish Allergy]     Hives     Ciprofloxacin Rash   Moxifloxacin Hives and Rash    Other reaction(s): OTHER   Sucralfate Rash    Itching     Family History: Family History  Problem Relation Age of Onset   Heart attack Mother 102   Heart failure Mother    Heart attack Father    Heart disease Father        CABG   Heart attack Brother 28       premature   Breast cancer Sister 74     Social History:  reports that she has never smoked. She has never used smokeless tobacco. She reports that she does not drink alcohol and does not use drugs.  ROS: Pertinent ROS in HPI  Physical Exam: BP 110/71   Pulse 67   Ht 5\' 5"  (1.651 m)   Wt 136 lb 11.2 oz (62 kg)   BMI 22.75 kg/m   Constitutional:  Well nourished. Alert and oriented, No acute distress. HEENT: [place] AT, moist mucus membranes.  Trachea midline Cardiovascular: No clubbing, cyanosis, or edema. Respiratory: Normal respiratory effort, no increased work of breathing.  deficits, moving all 4 extremities. Psychiatric: Normal mood and affect.    Laboratory Data: Comprehensive Metabolic Panel (CMP) Specimen: Blood Component Ref Range & Units 6 mo ago Comments  Glucose 70 - 110 mg/dL 90   Sodium 130 - 865 mmol/L 143   Potassium 3.6 - 5.1 mmol/L 4.3   Chloride 97 - 109 mmol/L 111 High    Carbon Dioxide (CO2) 22.0 - 32.0 mmol/L 26.4   Urea Nitrogen (BUN) 7 - 25 mg/dL 21   Creatinine 0.6 - 1.1 mg/dL 0.9   Glomerular Filtration Rate (eGFR) >60 mL/min/1.73sq m 63 CKD-EPI (2021) does not include patient's race in the calculation of eGFR.  Monitoring changes of plasma creatinine and eGFR over time is useful for monitoring kidney function.  Interpretive Ranges for eGFR (CKD-EPI 2021):  eGFR:       >60 mL/min/1.73 sq. m - Normal eGFR:       30-59 mL/min/1.73 sq. m - Moderately Decreased eGFR:       15-29 mL/min/1.73 sq. m  - Severely Decreased eGFR:       < 15 mL/min/1.73 sq. m  - Kidney Failure   Note: These eGFR calculations do not apply in acute situations when eGFR is changing rapidly or patients on dialysis.  Calcium 8.7 - 10.3  mg/dL 9.5   AST 8 - 39 U/L 17   ALT 5 - 38 U/L 9   Alk Phos (alkaline Phosphatase) 34 - 104 U/L 63   Albumin 3.5 - 4.8 g/dL 4.1   Bilirubin, Total 0.3 - 1.2 mg/dL 0.4   Protein, Total 6.1 - 7.9 g/dL 6.3   A/G Ratio 1.0 - 5.0 gm/dL 1.9   Resulting Agency [facility]   Specimen Collected: 10/20/22 08:59   Performed by: [facility] Last Resulted: 10/20/22 17:37  Received From: [facility]  Result Received: 01/04/23 13:59    Urinalysis Results for orders placed or performed in visit on 04/27/23  CULTURE, URINE COMPREHENSIVE   Specimen: Urine   UR  Result Value Ref Range   Urine Culture, Comprehensive Preliminary report    Organism ID, Bacteria Comment   Microscopic Examination   Urine  Result Value Ref Range   WBC, UA 0-5 0 - 5 /hpf   RBC, Urine 0-2 0 - 2 /hpf   Epithelial Cells (non renal) >10 (A) 0 - 10 /hpf   Crystals Present (A) N/A   Crystal Type Amorphous Sediment N/A   Mucus, UA Present (A) Not Estab.   Bacteria, UA Few None seen/Few  Urinalysis, Complete  Result Value Ref Range   Specific Gravity, UA 1.015 1.005 - 1.030   pH, UA 7.0 5.0 - 7.5   Color, UA Yellow Yellow   Appearance Ur Hazy (A) Clear   Leukocytes,UA Negative Negative   Protein,UA Negative Negative/Trace   Glucose, UA Negative Negative   Ketones, UA Negative Negative   RBC, UA Negative Negative   Bilirubin, UA Negative Negative   Urobilinogen, Ur 0.2 0.2 - 1.0 mg/dL   Nitrite, UA Negative Negative   Microscopic Examination See below:   Bladder Scan (Post Void Residual) in office  Result Value Ref Range   Scan Result 0ml      I have reviewed the labs.   Pertinent Imaging:  04/27/23 13:53  Scan Result 0ml    Assessment & Plan:    1. Feelings of incomplete bladder emptying - UA benign  -urine sent for culture to rule out indolent infections -PVR demonstrates adequate emptying  -will have her discontinue Myrbetriq and start oxybutynin IR 5 mg daily -Discussed that it may be her inadequate fluid intake or even her back that may be causing her urinary symptoms  2. Frequency -urine culture is pending -likely a component of OAB -start oxybutynin IR 5 mg daily  Return in about 6 weeks (around 06/08/2023)  for OAB and PVR .  These notes generated with voice recognition software. I apologize for typographical errors.  Cloretta Ned  [facility] [address]  [address] [place], [place] [address] [phone]

## 2023-04-27 ENCOUNTER — Ambulatory Visit: Payer: PPO | Admitting: Urology

## 2023-04-27 ENCOUNTER — Encounter: Payer: Self-pay | Admitting: Urology

## 2023-04-27 VITALS — BP 110/71 | HR 67 | Ht 65.0 in | Wt 136.7 lb

## 2023-04-27 DIAGNOSIS — N3281 Overactive bladder: Secondary | ICD-10-CM

## 2023-04-27 DIAGNOSIS — R3914 Feeling of incomplete bladder emptying: Secondary | ICD-10-CM

## 2023-04-27 DIAGNOSIS — R350 Frequency of micturition: Secondary | ICD-10-CM

## 2023-04-27 LAB — MICROSCOPIC EXAMINATION: Epithelial Cells (non renal): >10 /hpf — AB (ref 0–10)

## 2023-04-27 LAB — URINALYSIS, COMPLETE
Bilirubin, UA: NEGATIVE
Glucose, UA: NEGATIVE
Ketones, UA: NEGATIVE
Leukocytes,UA: NEGATIVE
Nitrite, UA: NEGATIVE
Protein,UA: NEGATIVE
RBC, UA: NEGATIVE
Specific Gravity, UA: 1.015 (ref 1.005–1.030)
Urobilinogen, Ur: 0.2 mg/dL (ref 0.2–1.0)
pH, UA: 7 (ref 5.0–7.5)

## 2023-04-27 LAB — BLADDER SCAN AMB NON-IMAGING

## 2023-04-27 MED ORDER — OXYBUTYNIN CHLORIDE 5 MG PO TABS: 5.0000 mg | ORAL_TABLET | Freq: Every day | ORAL | 0 refills | Status: DC

## 2023-05-03 ENCOUNTER — Ambulatory Visit: Payer: PPO | Admitting: Cardiovascular Disease

## 2023-05-05 DIAGNOSIS — M4317 Spondylolisthesis, lumbosacral region: Secondary | ICD-10-CM | POA: Diagnosis not present

## 2023-05-12 ENCOUNTER — Other Ambulatory Visit: Payer: Self-pay | Admitting: Neurosurgery

## 2023-05-12 DIAGNOSIS — E782 Mixed hyperlipidemia: Secondary | ICD-10-CM | POA: Diagnosis not present

## 2023-05-12 DIAGNOSIS — E538 Deficiency of other specified B group vitamins: Secondary | ICD-10-CM | POA: Diagnosis not present

## 2023-05-18 ENCOUNTER — Other Ambulatory Visit: Payer: Self-pay | Admitting: Neurosurgery

## 2023-05-18 DIAGNOSIS — M4134 Thoracogenic scoliosis, thoracic region: Secondary | ICD-10-CM | POA: Diagnosis not present

## 2023-05-18 DIAGNOSIS — E782 Mixed hyperlipidemia: Secondary | ICD-10-CM | POA: Diagnosis not present

## 2023-05-18 DIAGNOSIS — R7989 Other specified abnormal findings of blood chemistry: Secondary | ICD-10-CM | POA: Diagnosis not present

## 2023-05-18 DIAGNOSIS — Z79899 Other long term (current) drug therapy: Secondary | ICD-10-CM | POA: Diagnosis not present

## 2023-05-18 DIAGNOSIS — R739 Hyperglycemia, unspecified: Secondary | ICD-10-CM | POA: Diagnosis not present

## 2023-05-18 DIAGNOSIS — E538 Deficiency of other specified B group vitamins: Secondary | ICD-10-CM | POA: Diagnosis not present

## 2023-05-19 DIAGNOSIS — M4317 Spondylolisthesis, lumbosacral region: Secondary | ICD-10-CM | POA: Diagnosis not present

## 2023-05-31 NOTE — Pre-Procedure Instructions (Signed)
Surgical Instructions   Your procedure is scheduled on Wednesday, September 25th. Report to [facility] Main Entrance "A" at 08:00 A.M., then check in with the Admitting office. Any questions or running late day of surgery: call [phone]  Questions prior to your surgery date: call [phone], Monday-Friday, 8am-4pm. If you experience any cold or flu symptoms such as cough, fever, chills, shortness of breath, etc. between now and your scheduled surgery, please notify us at the above number.     Remember:  Do not eat or drink after midnight the night before your surgery     Take these medicines the morning of surgery with A SIP OF WATER  cetirizine (ZYRTEC)  estradiol (ESTRACE)  fluticasone (FLONASE)  lansoprazole (PREVACID)  topiramate (TOPAMAX)   May take these medicines IF NEEDED: HYDROcodone-acetaminophen (NORCO/VICODIN)  metoCLOPramide (REGLAN)   Follow your surgeon's instructions on when to stop Aspirin.  If no instructions were given by your surgeon then you will need to call the office to get those instructions.     One week prior to surgery, STOP taking any Aleve, Naproxen, Ibuprofen, Motrin, Advil, Goody's, BC's, all herbal medications, fish oil, and non-prescription vitamins.                     Do NOT Smoke (Tobacco/Vaping) for 24 hours prior to your procedure.  If you use a CPAP at night, you may bring your mask/headgear for your overnight stay.   You will be asked to remove any contacts, glasses, piercing's, hearing aid's, dentures/partials prior to surgery. Please bring cases for these items if needed.    Patients discharged the day of surgery will not be allowed to drive home, and someone needs to stay with them for 24 hours.  SURGICAL WAITING ROOM VISITATION Patients may have no more than 2 support people in the waiting area - these visitors may rotate.   Pre-op nurse will coordinate an appropriate time for 1 ADULT support person, who may not rotate, to  accompany patient in pre-op.  Children under the age of 37 must have an adult with them who is not the patient and must remain in the main waiting area with an adult.  If the patient needs to stay at the hospital during part of their recovery, the visitor guidelines for inpatient rooms apply.  Please refer to the [facility] website for the visitor guidelines for any additional information.   If you received a COVID test during your pre-op visit  it is requested that you wear a mask when out in public, stay away from anyone that may not be feeling well and notify your surgeon if you develop symptoms. If you have been in contact with anyone that has tested positive in the last 10 days please notify you surgeon.      Pre-operative 5 CHG Bathing Instructions   You can play a key role in reducing the risk of infection after surgery. Your skin needs to be as free of germs as possible. You can reduce the number of germs on your skin by washing with CHG (chlorhexidine gluconate) soap before surgery. CHG is an antiseptic soap that kills germs and continues to kill germs even after washing.   DO NOT use if you have an allergy to chlorhexidine/CHG or antibacterial soaps. If your skin becomes reddened or irritated, stop using the CHG and notify one of our RNs at [phone].   Please shower with the CHG soap starting 4 days before surgery using the  following schedule:     Please keep in mind the following:  DO NOT shave, including legs and underarms, starting the day of your first shower.   You may shave your face at any point before/day of surgery.  Place clean sheets on your bed the day you start using CHG soap. Use a clean washcloth (not used since being washed) for each shower. DO NOT sleep with pets once you start using the CHG.   CHG Shower Instructions:  If you choose to wash your hair and private area, wash first with your normal shampoo/soap.  After you use shampoo/soap, rinse your  hair and body thoroughly to remove shampoo/soap residue.  Turn the water OFF and apply about 3 tablespoons (45 ml) of CHG soap to a CLEAN washcloth.  Apply CHG soap ONLY FROM YOUR NECK DOWN TO YOUR TOES (washing for 3-5 minutes)  DO NOT use CHG soap on face, private areas, open wounds, or sores.  Pay special attention to the area where your surgery is being performed.  If you are having back surgery, having someone wash your back for you may be helpful. Wait 2 minutes after CHG soap is applied, then you may rinse off the CHG soap.  Pat dry with a clean towel  Put on clean clothes/pajamas   If you choose to wear lotion, please use ONLY the CHG-compatible lotions on the back of this paper.   Additional instructions for the day of surgery: DO NOT APPLY any lotions, deodorants, cologne, or perfumes.   Do not bring valuables to the hospital. [facility] is not responsible for any belongings/valuables. Do not wear nail polish, gel polish, artificial nails, or any other type of covering on natural nails (fingers and toes) Do not wear jewelry or makeup Put on clean/comfortable clothes.  Please brush your teeth.  Ask your nurse before applying any prescription medications to the skin.     CHG Compatible Lotions   Aveeno Moisturizing lotion  Cetaphil Moisturizing Cream  Cetaphil Moisturizing Lotion  Clairol Herbal Essence Moisturizing Lotion, Dry Skin  Clairol Herbal Essence Moisturizing Lotion, Extra Dry Skin  Clairol Herbal Essence Moisturizing Lotion, Normal Skin  Curel Age Defying Therapeutic Moisturizing Lotion with Alpha Hydroxy  Curel Extreme Care Body Lotion  Curel Soothing Hands Moisturizing Hand Lotion  Curel Therapeutic Moisturizing Cream, Fragrance-Free  Curel Therapeutic Moisturizing Lotion, Fragrance-Free  Curel Therapeutic Moisturizing Lotion, Original Formula  Eucerin Daily Replenishing Lotion  Eucerin Dry Skin Therapy Plus Alpha Hydroxy Crme  Eucerin Dry Skin Therapy  Plus Alpha Hydroxy Lotion  Eucerin Original Crme  Eucerin Original Lotion  Eucerin Plus Crme Eucerin Plus Lotion  Eucerin TriLipid Replenishing Lotion  Keri Anti-Bacterial Hand Lotion  Keri Deep Conditioning Original Lotion Dry Skin Formula Softly Scented  Keri Deep Conditioning Original Lotion, Fragrance Free Sensitive Skin Formula  Keri Lotion Fast Absorbing Fragrance Free Sensitive Skin Formula  Keri Lotion Fast Absorbing Softly Scented Dry Skin Formula  Keri Original Lotion  Keri Skin Renewal Lotion Keri Silky Smooth Lotion  Keri Silky Smooth Sensitive Skin Lotion  Nivea Body Creamy Conditioning Oil  Nivea Body Extra Enriched Lotion  Nivea Body Original Lotion  Nivea Body Sheer Moisturizing Lotion Nivea Crme  Nivea Skin Firming Lotion  NutraDerm 30 Skin Lotion  NutraDerm Skin Lotion  NutraDerm Therapeutic Skin Cream  NutraDerm Therapeutic Skin Lotion  ProShield Protective Hand Cream  Provon moisturizing lotion  Please read over the following fact sheets that you were given.

## 2023-06-01 ENCOUNTER — Other Ambulatory Visit: Payer: Self-pay

## 2023-06-01 ENCOUNTER — Encounter (HOSPITAL_COMMUNITY): Payer: Self-pay

## 2023-06-01 ENCOUNTER — Encounter (HOSPITAL_COMMUNITY)
Admission: RE | Admit: 2023-06-01 | Discharge: 2023-06-01 | Disposition: A | Discharge status: Home / Self Care | Payer: PPO | Source: Ambulatory Visit | Attending: Neurosurgery | Admitting: Neurosurgery

## 2023-06-01 VITALS — BP 119/69 | HR 80 | Temp 98.0°F | Resp 18 | Ht 65.0 in | Wt 136.7 lb

## 2023-06-01 DIAGNOSIS — Z01812 Encounter for preprocedural laboratory examination: Secondary | ICD-10-CM | POA: Insufficient documentation

## 2023-06-01 DIAGNOSIS — I2510 Atherosclerotic heart disease of native coronary artery without angina pectoris: Secondary | ICD-10-CM | POA: Insufficient documentation

## 2023-06-01 DIAGNOSIS — Z01818 Encounter for other preprocedural examination: Secondary | ICD-10-CM

## 2023-06-01 LAB — TYPE AND SCREEN
ABO/RH(D): A POS
Antibody Screen: NEGATIVE

## 2023-06-01 LAB — BASIC METABOLIC PANEL
Anion gap: 6 (ref 5–15)
BUN: 16 mg/dL (ref 8–23)
CO2: 20 mmol/L — ABNORMAL LOW (ref 22–32)
Calcium: 9 mg/dL (ref 8.9–10.3)
Chloride: 108 mmol/L (ref 98–111)
Creatinine, Ser: 1.02 mg/dL — ABNORMAL HIGH (ref 0.44–1.00)
GFR, Estimated: 54 mL/min — ABNORMAL LOW (ref >60)
Glucose, Bld: 87 mg/dL (ref 70–99)
Potassium: 4.2 mmol/L (ref 3.5–5.1)
Sodium: 134 mmol/L — ABNORMAL LOW (ref 135–145)

## 2023-06-01 LAB — SURGICAL PCR SCREEN
MRSA, PCR: POSITIVE — AB
Staphylococcus aureus: POSITIVE — AB

## 2023-06-01 NOTE — Progress Notes (Signed)
Positive PCR result communicated to [facility], at Dr. Lovell Sheehan' office

## 2023-06-01 NOTE — Progress Notes (Addendum)
PCP - Dr. Bethann Punches Cardiologist - Dr. Julien Nordmann  PPM/ICD - denies   Chest x-ray - 01/24/02 EKG - 04/15/23 Stress Test - 10/07/16 ECHO - 10/07/16 Cardiac Cath - 01/2002  Sleep Study - denies   DM- denies  Blood Thinner Instructions: Aspirin Instructions: Hold 5-7 days. Last dose 9/18-9/19.  ERAS Protcol - no, NPO   COVID TEST- n/a   Anesthesia review: yes, cardiac hx.  Patient denies shortness of breath, fever, cough and chest pain at PAT appointment   All instructions explained to the patient, with a verbal understanding of the material. Patient agrees to go over the instructions while at home for a better understanding.  The opportunity to ask questions was provided.

## 2023-06-02 NOTE — Anesthesia Preprocedure Evaluation (Addendum)
Anesthesia Evaluation  Patient identified by MRN, date of birth, ID band Patient awake    Reviewed: Allergy & Precautions, H&P , NPO status , Patient's Chart, lab work & pertinent test results  Airway Mallampati: II  TM Distance: >3 FB Neck ROM: Full    Dental no notable dental hx.    Pulmonary neg pulmonary ROS   Pulmonary exam normal breath sounds clear to auscultation       Cardiovascular + Valvular Problems/Murmurs MR  Rhythm:Regular Rate:Normal + Systolic murmurs TTE 04/05/20 (care everywhere): INTERPRETATION  NORMAL LEFT VENTRICULAR SYSTOLIC FUNCTION  NORMAL RIGHT VENTRICULAR SYSTOLIC FUNCTION  MILD VALVULAR REGURGITATION (See above)  NO VALVULAR STENOSIS  Mild/mod MR     Neuro/Psych negative neurological ROS  negative psych ROS   GI/Hepatic Neg liver ROS,GERD  Medicated,,  Endo/Other  negative endocrine ROS    Renal/GU negative Renal ROS  negative genitourinary   Musculoskeletal negative musculoskeletal ROS (+)    Abdominal   Peds negative pediatric ROS (+)  Hematology negative hematology ROS (+)   Anesthesia Other Findings   Reproductive/Obstetrics negative OB ROS                             Anesthesia Physical Anesthesia Plan  ASA: 3  Anesthesia Plan: General   Post-op Pain Management: Gabapentin PO (pre-op)* and Tylenol PO (pre-op)*   Induction: Intravenous  PONV Risk Score and Plan: 3 and Ondansetron, Dexamethasone and Treatment may vary due to age or medical condition  Airway Management Planned: Oral ETT  Additional Equipment:   Intra-op Plan:   Post-operative Plan: Extubation in OR  Informed Consent: I have reviewed the patients History and Physical, chart, labs and discussed the procedure including the risks, benefits and alternatives for the proposed anesthesia with the patient or authorized representative who has indicated his/her understanding and  acceptance.     Dental advisory given  Plan Discussed with: CRNA and Surgeon  Anesthesia Plan Comments: (PAT note by Antionette Poles, PA-C:  Follows with cardiology for hx of noncardiac CP with normal cath 2003, stable DOE, LE edema. Nuclear stress 09/2016 was low risk. Echo 03/2020 showed normal biventricular function, no significant valvular abnormalities. Last seen by Dr. Mariah Milling 04/15/23 and noted to be stable at that time. Discussed she may be need back surgery. No changes to management. 1 year followup recommended.   Last seen by PCP Dr. Bethann Punches on 05/18/23. Per note, "Lumbar radiculitis-very severe, very disabled, upcoming surgery, twice daily hydrocodone with gabapentin until surgery"  Hx of GERD as well as N/V. Maintained on lansoprazole and metoclopramide.  CBC 05/12/23 reviewed, unremarkable.  Preop labs reviewed, mild hyponatremia with sodium 134. Otherwise unremarkable.   EKG 04/15/23: NSR. Rate 74.  TTE 04/05/20 (care everywhere): INTERPRETATION  NORMAL LEFT VENTRICULAR SYSTOLIC FUNCTION  NORMAL RIGHT VENTRICULAR SYSTOLIC FUNCTION  MILD VALVULAR REGURGITATION (See above)  NO VALVULAR STENOSIS  Mild/mod MR   Nuclear stress 10/07/16:  This is a low risk study.  There is a small in size, mild in severity, reversible defect involving the apical lateral and mid inferolateral segments that may represent subtle ischemia or artifact.  The left ventricular ejection fraction is hyperdynamic (>65%).   )        Anesthesia Quick Evaluation

## 2023-06-02 NOTE — Progress Notes (Signed)
Anesthesia Chart Review:  Follows with cardiology for hx of noncardiac CP with normal cath 2003, stable DOE, LE edema. Nuclear stress 09/2016 was low risk. Echo 03/2020 showed normal biventricular function, no significant valvular abnormalities. Last seen by Dr. Mariah Milling 04/15/23 and noted to be stable at that time. Discussed she may be need back surgery. No changes to management. 1 year followup recommended.   Last seen by PCP Dr. Bethann Punches on 05/18/23. Per note, "Lumbar radiculitis-very severe, very disabled, upcoming surgery, twice daily hydrocodone with gabapentin until surgery"  Hx of GERD as well as N/V. Maintained on lansoprazole and metoclopramide.  CBC 05/12/23 reviewed, unremarkable.  Preop labs reviewed, mild hyponatremia with sodium 134. Otherwise unremarkable.   EKG 04/15/23: NSR. Rate 74.  TTE 04/05/20 (care everywhere): INTERPRETATION  NORMAL LEFT VENTRICULAR SYSTOLIC FUNCTION  NORMAL RIGHT VENTRICULAR SYSTOLIC FUNCTION  MILD VALVULAR REGURGITATION (See above)  NO VALVULAR STENOSIS  Mild/mod MR   Nuclear stress 10/07/16: This is a low risk study. There is a small in size, mild in severity, reversible defect involving the apical lateral and mid inferolateral segments that may represent subtle ischemia or artifact. The left ventricular ejection fraction is hyperdynamic (>65%).    Zannie Cove [facility] Short Stay Center/Anesthesiology Phone [phone] 06/02/2023 2:44 PM

## 2023-06-08 ENCOUNTER — Ambulatory Visit: Payer: PPO | Admitting: Urology

## 2023-06-09 ENCOUNTER — Ambulatory Visit (HOSPITAL_COMMUNITY): Payer: PPO

## 2023-06-09 ENCOUNTER — Ambulatory Visit (HOSPITAL_BASED_OUTPATIENT_CLINIC_OR_DEPARTMENT_OTHER): Payer: PPO | Admitting: Anesthesiology

## 2023-06-09 ENCOUNTER — Ambulatory Visit (HOSPITAL_COMMUNITY)
Admission: AD | Disposition: A | Discharge status: Home / Self Care | Payer: Self-pay | Source: Ambulatory Visit | Attending: Neurosurgery

## 2023-06-09 ENCOUNTER — Encounter (HOSPITAL_COMMUNITY): Payer: Self-pay | Admitting: Neurosurgery

## 2023-06-09 ENCOUNTER — Ambulatory Visit (HOSPITAL_COMMUNITY): Payer: PPO | Admitting: Physician Assistant

## 2023-06-09 ENCOUNTER — Ambulatory Visit (HOSPITAL_COMMUNITY)
Admission: AD | Admit: 2023-06-09 | Discharge: 2023-06-10 | Disposition: A | Discharge status: Home / Self Care | Payer: PPO | Source: Ambulatory Visit | Attending: Neurosurgery | Admitting: Neurosurgery

## 2023-06-09 ENCOUNTER — Other Ambulatory Visit: Payer: Self-pay

## 2023-06-09 DIAGNOSIS — M4317 Spondylolisthesis, lumbosacral region: Secondary | ICD-10-CM | POA: Diagnosis present

## 2023-06-09 DIAGNOSIS — M4807 Spinal stenosis, lumbosacral region: Secondary | ICD-10-CM | POA: Diagnosis not present

## 2023-06-09 DIAGNOSIS — M419 Scoliosis, unspecified: Secondary | ICD-10-CM | POA: Insufficient documentation

## 2023-06-09 DIAGNOSIS — M5117 Intervertebral disc disorders with radiculopathy, lumbosacral region: Secondary | ICD-10-CM | POA: Insufficient documentation

## 2023-06-09 DIAGNOSIS — K219 Gastro-esophageal reflux disease without esophagitis: Secondary | ICD-10-CM | POA: Diagnosis not present

## 2023-06-09 DIAGNOSIS — M4316 Spondylolisthesis, lumbar region: Secondary | ICD-10-CM

## 2023-06-09 DIAGNOSIS — M48062 Spinal stenosis, lumbar region with neurogenic claudication: Secondary | ICD-10-CM | POA: Diagnosis not present

## 2023-06-09 DIAGNOSIS — M9689 Other intraoperative and postprocedural complications and disorders of the musculoskeletal system: Secondary | ICD-10-CM | POA: Diagnosis not present

## 2023-06-09 DIAGNOSIS — E782 Mixed hyperlipidemia: Secondary | ICD-10-CM

## 2023-06-09 HISTORY — PX: TRANSFORAMINAL LUMBAR INTERBODY FUSION (TLIF) WITH PEDICLE SCREW FIXATION 1 LEVEL: SHX6141

## 2023-06-09 SURGERY — TRANSFORAMINAL LUMBAR INTERBODY FUSION (TLIF) WITH PEDICLE SCREW FIXATION 1 LEVEL
Anesthesia: General

## 2023-06-09 MED ORDER — OXYCODONE HCL 5 MG/5ML PO SOLN: 5.0000 mg | Freq: Once | ORAL | Status: DC | PRN

## 2023-06-09 MED ORDER — BUPIVACAINE-EPINEPHRINE (PF) 0.5% -1:200000 IJ SOLN
INTRAMUSCULAR | Status: AC
  Filled 2023-06-09: qty 30

## 2023-06-09 MED ORDER — BACITRACIN ZINC 500 UNIT/GM EX OINT
TOPICAL_OINTMENT | CUTANEOUS | Status: AC
  Filled 2023-06-09: qty 28.35

## 2023-06-09 MED ORDER — MORPHINE SULFATE (PF) 4 MG/ML IV SOLN: 4.0000 mg | INTRAVENOUS | Status: DC | PRN

## 2023-06-09 MED ORDER — MELATONIN 3 MG PO TABS
3.0000 mg | ORAL_TABLET | Freq: Every evening | ORAL | Status: DC | PRN
  Filled 2023-06-09: qty 1

## 2023-06-09 MED ORDER — ONDANSETRON HCL 4 MG/2ML IJ SOLN
INTRAMUSCULAR | Status: AC
  Filled 2023-06-09: qty 2

## 2023-06-09 MED ORDER — DOCUSATE SODIUM 100 MG PO CAPS
100.0000 mg | ORAL_CAPSULE | Freq: Two times a day (BID) | ORAL | Status: DC
  Administered 2023-06-09 – 2023-06-10 (×2): 100 mg via ORAL
  Filled 2023-06-09 (×2): qty 1

## 2023-06-09 MED ORDER — METOCLOPRAMIDE HCL 5 MG PO TABS: 5.0000 mg | ORAL_TABLET | Freq: Three times a day (TID) | ORAL | Status: DC | PRN

## 2023-06-09 MED ORDER — BISACODYL 10 MG RE SUPP: 10.0000 mg | Freq: Every day | RECTAL | Status: DC | PRN

## 2023-06-09 MED ORDER — FENTANYL CITRATE (PF) 250 MCG/5ML IJ SOLN
INTRAMUSCULAR | Status: AC
  Filled 2023-06-09: qty 5

## 2023-06-09 MED ORDER — CHLORHEXIDINE GLUCONATE CLOTH 2 % EX PADS: 6.0000 | MEDICATED_PAD | Freq: Once | CUTANEOUS | Status: DC

## 2023-06-09 MED ORDER — HYDROMORPHONE HCL 1 MG/ML IJ SOLN
INTRAMUSCULAR | Status: AC
  Filled 2023-06-09: qty 1

## 2023-06-09 MED ORDER — CYCLOBENZAPRINE HCL 5 MG PO TABS
5.0000 mg | ORAL_TABLET | Freq: Three times a day (TID) | ORAL | Status: DC | PRN
  Administered 2023-06-09: 5 mg via ORAL
  Filled 2023-06-09: qty 1

## 2023-06-09 MED ORDER — ACETAMINOPHEN 500 MG PO TABS
1000.0000 mg | ORAL_TABLET | Freq: Four times a day (QID) | ORAL | Status: DC
  Administered 2023-06-10: 1000 mg via ORAL
  Filled 2023-06-09: qty 2

## 2023-06-09 MED ORDER — ONDANSETRON HCL 4 MG/2ML IJ SOLN: 4.0000 mg | Freq: Four times a day (QID) | INTRAMUSCULAR | Status: DC | PRN

## 2023-06-09 MED ORDER — PHENYLEPHRINE 80 MCG/ML (10ML) SYRINGE FOR IV PUSH (FOR BLOOD PRESSURE SUPPORT)
PREFILLED_SYRINGE | INTRAVENOUS | Status: AC
  Filled 2023-06-09: qty 10

## 2023-06-09 MED ORDER — PHENYLEPHRINE 80 MCG/ML (10ML) SYRINGE FOR IV PUSH (FOR BLOOD PRESSURE SUPPORT)
PREFILLED_SYRINGE | INTRAVENOUS | Status: DC | PRN
  Administered 2023-06-09 (×3): 160 ug via INTRAVENOUS
  Administered 2023-06-09: 80 ug via INTRAVENOUS

## 2023-06-09 MED ORDER — FENTANYL CITRATE (PF) 250 MCG/5ML IJ SOLN
INTRAMUSCULAR | Status: DC | PRN
  Administered 2023-06-09 (×2): 25 ug via INTRAVENOUS
  Administered 2023-06-09: 50 ug via INTRAVENOUS

## 2023-06-09 MED ORDER — ACETAMINOPHEN 325 MG PO TABS: 650.0000 mg | ORAL_TABLET | ORAL | Status: DC | PRN

## 2023-06-09 MED ORDER — OXYCODONE HCL 5 MG PO TABS: 10.0000 mg | ORAL_TABLET | ORAL | Status: DC | PRN

## 2023-06-09 MED ORDER — TOPIRAMATE 25 MG PO TABS
150.0000 mg | ORAL_TABLET | Freq: Every day | ORAL | Status: DC
  Administered 2023-06-09: 150 mg via ORAL
  Filled 2023-06-09 (×2): qty 2

## 2023-06-09 MED ORDER — VANCOMYCIN HCL IN DEXTROSE 1-5 GM/200ML-% IV SOLN
1000.0000 mg | INTRAVENOUS | Status: AC
  Administered 2023-06-09: 1000 mg via INTRAVENOUS
  Filled 2023-06-09: qty 200

## 2023-06-09 MED ORDER — SODIUM CHLORIDE 0.9% FLUSH: 3.0000 mL | INTRAVENOUS | Status: DC | PRN

## 2023-06-09 MED ORDER — BUPIVACAINE LIPOSOME 1.3 % IJ SUSP
INTRAMUSCULAR | Status: DC | PRN
Start: 2023-06-09 — End: 2023-06-09
  Administered 2023-06-09: 20 mL

## 2023-06-09 MED ORDER — LACTATED RINGERS IV SOLN: INTRAVENOUS | Status: DC

## 2023-06-09 MED ORDER — BACITRACIN ZINC 500 UNIT/GM EX OINT
TOPICAL_OINTMENT | CUTANEOUS | Status: DC | PRN
  Administered 2023-06-09: 1 via TOPICAL

## 2023-06-09 MED ORDER — PANTOPRAZOLE SODIUM 40 MG PO TBEC: 40.0000 mg | DELAYED_RELEASE_TABLET | Freq: Every day | ORAL | Status: DC

## 2023-06-09 MED ORDER — LIDOCAINE 2% (20 MG/ML) 5 ML SYRINGE
INTRAMUSCULAR | Status: DC | PRN
  Administered 2023-06-09: 60 mg via INTRAVENOUS

## 2023-06-09 MED ORDER — PROPOFOL 10 MG/ML IV BOLUS
INTRAVENOUS | Status: DC | PRN
Start: 2023-06-09 — End: 2023-06-09
  Administered 2023-06-09: 100 mg via INTRAVENOUS

## 2023-06-09 MED ORDER — CHLORHEXIDINE GLUCONATE 0.12 % MT SOLN
15.0000 mL | Freq: Once | OROMUCOSAL | Status: AC
  Administered 2023-06-09: 15 mL via OROMUCOSAL
  Filled 2023-06-09: qty 15

## 2023-06-09 MED ORDER — ESTRADIOL 0.5 MG PO TABS
0.5000 mg | ORAL_TABLET | Freq: Every day | ORAL | Status: DC
  Filled 2023-06-09: qty 1

## 2023-06-09 MED ORDER — ROCURONIUM BROMIDE 10 MG/ML (PF) SYRINGE
PREFILLED_SYRINGE | INTRAVENOUS | Status: DC | PRN
  Administered 2023-06-09 (×2): 10 mg via INTRAVENOUS
  Administered 2023-06-09: 60 mg via INTRAVENOUS

## 2023-06-09 MED ORDER — CEFAZOLIN SODIUM-DEXTROSE 2-4 GM/100ML-% IV SOLN: 2.0000 g | INTRAVENOUS | Status: DC

## 2023-06-09 MED ORDER — DEXAMETHASONE SODIUM PHOSPHATE 10 MG/ML IJ SOLN
INTRAMUSCULAR | Status: AC
  Filled 2023-06-09: qty 1

## 2023-06-09 MED ORDER — ONDANSETRON HCL 4 MG/2ML IJ SOLN: 4.0000 mg | Freq: Once | INTRAMUSCULAR | Status: DC | PRN

## 2023-06-09 MED ORDER — PHENOL 1.4 % MT LIQD: 1.0000 | OROMUCOSAL | Status: DC | PRN

## 2023-06-09 MED ORDER — ONDANSETRON HCL 4 MG PO TABS: 4.0000 mg | ORAL_TABLET | Freq: Four times a day (QID) | ORAL | Status: DC | PRN

## 2023-06-09 MED ORDER — THROMBIN 5000 UNITS EX SOLR
CUTANEOUS | Status: AC
  Filled 2023-06-09: qty 5000

## 2023-06-09 MED ORDER — LORATADINE 10 MG PO TABS: 10.0000 mg | ORAL_TABLET | Freq: Every day | ORAL | Status: DC

## 2023-06-09 MED ORDER — ACETAMINOPHEN 325 MG PO TABS: 650.0000 mg | ORAL_TABLET | Freq: Four times a day (QID) | ORAL | Status: DC | PRN

## 2023-06-09 MED ORDER — ORAL CARE MOUTH RINSE: 15.0000 mL | Freq: Once | OROMUCOSAL | Status: AC

## 2023-06-09 MED ORDER — PHENYLEPHRINE HCL-NACL 20-0.9 MG/250ML-% IV SOLN
INTRAVENOUS | Status: DC | PRN
  Administered 2023-06-09: 15 ug/min via INTRAVENOUS

## 2023-06-09 MED ORDER — SUGAMMADEX SODIUM 200 MG/2ML IV SOLN
INTRAVENOUS | Status: DC | PRN
  Administered 2023-06-09: 130 mg via INTRAVENOUS

## 2023-06-09 MED ORDER — ZOLPIDEM TARTRATE 5 MG PO TABS: 5.0000 mg | ORAL_TABLET | Freq: Every evening | ORAL | Status: DC | PRN

## 2023-06-09 MED ORDER — FLUTICASONE PROPIONATE 50 MCG/ACT NA SUSP
1.0000 | Freq: Every day | NASAL | Status: DC
  Filled 2023-06-09: qty 16

## 2023-06-09 MED ORDER — BUPIVACAINE LIPOSOME 1.3 % IJ SUSP
INTRAMUSCULAR | Status: AC
  Filled 2023-06-09: qty 20

## 2023-06-09 MED ORDER — SODIUM CHLORIDE 0.9 % IV SOLN
250.0000 mL | INTRAVENOUS | Status: DC
  Administered 2023-06-09: 250 mL via INTRAVENOUS

## 2023-06-09 MED ORDER — BUPIVACAINE-EPINEPHRINE (PF) 0.5% -1:200000 IJ SOLN
INTRAMUSCULAR | Status: DC | PRN
  Administered 2023-06-09: 10 mL via PERINEURAL

## 2023-06-09 MED ORDER — ROCURONIUM BROMIDE 10 MG/ML (PF) SYRINGE
PREFILLED_SYRINGE | INTRAVENOUS | Status: AC
  Filled 2023-06-09: qty 10

## 2023-06-09 MED ORDER — PROPOFOL 10 MG/ML IV BOLUS
INTRAVENOUS | Status: AC
  Filled 2023-06-09: qty 20

## 2023-06-09 MED ORDER — SODIUM CHLORIDE 0.9% FLUSH
3.0000 mL | Freq: Two times a day (BID) | INTRAVENOUS | Status: DC
  Administered 2023-06-09: 3 mL via INTRAVENOUS

## 2023-06-09 MED ORDER — THROMBIN 5000 UNITS EX SOLR
OROMUCOSAL | Status: DC | PRN
  Administered 2023-06-09 (×2): 5 mL via TOPICAL

## 2023-06-09 MED ORDER — HYDROMORPHONE HCL 1 MG/ML IJ SOLN: 0.2500 mg | INTRAMUSCULAR | Status: DC | PRN

## 2023-06-09 MED ORDER — 0.9 % SODIUM CHLORIDE (POUR BTL) OPTIME
TOPICAL | Status: DC | PRN
Start: 2023-06-09 — End: 2023-06-09
  Administered 2023-06-09: 1000 mL

## 2023-06-09 MED ORDER — HYDROCODONE-ACETAMINOPHEN 5-325 MG PO TABS
1.0000 | ORAL_TABLET | ORAL | Status: DC | PRN
  Administered 2023-06-09: 2 via ORAL
  Administered 2023-06-10 (×2): 1 via ORAL
  Filled 2023-06-09: qty 2
  Filled 2023-06-09 (×2): qty 1

## 2023-06-09 MED ORDER — DEXAMETHASONE SODIUM PHOSPHATE 10 MG/ML IJ SOLN
INTRAMUSCULAR | Status: DC | PRN
  Administered 2023-06-09: 5 mg via INTRAVENOUS

## 2023-06-09 MED ORDER — GABAPENTIN 300 MG PO CAPS
300.0000 mg | ORAL_CAPSULE | Freq: Every day | ORAL | Status: DC
  Administered 2023-06-09: 300 mg via ORAL
  Filled 2023-06-09: qty 1

## 2023-06-09 MED ORDER — SURGIRINSE WOUND IRRIGATION SYSTEM - OPTIME
TOPICAL | Status: DC | PRN
  Administered 2023-06-09: 450 mL via TOPICAL

## 2023-06-09 MED ORDER — OXYCODONE HCL 5 MG PO TABS: 5.0000 mg | ORAL_TABLET | Freq: Once | ORAL | Status: DC | PRN

## 2023-06-09 MED ORDER — ACETAMINOPHEN 650 MG RE SUPP: 650.0000 mg | RECTAL | Status: DC | PRN

## 2023-06-09 MED ORDER — HYDROMORPHONE HCL 1 MG/ML IJ SOLN
0.2500 mg | INTRAMUSCULAR | Status: DC | PRN
  Administered 2023-06-09: 0.25 mg via INTRAVENOUS

## 2023-06-09 MED ORDER — LIDOCAINE 2% (20 MG/ML) 5 ML SYRINGE
INTRAMUSCULAR | Status: AC
  Filled 2023-06-09: qty 5

## 2023-06-09 MED ORDER — OXYCODONE HCL 5 MG PO TABS: 5.0000 mg | ORAL_TABLET | ORAL | Status: DC | PRN

## 2023-06-09 MED ORDER — MENTHOL 3 MG MT LOZG: 1.0000 | LOZENGE | OROMUCOSAL | Status: DC | PRN

## 2023-06-09 SURGICAL SUPPLY — 64 items
APL SKNCLS STERI-STRIP NONHPOA (GAUZE/BANDAGES/DRESSINGS) ×1
BAG COUNTER SPONGE SURGICOUNT (BAG) ×1 IMPLANT
BAG SPNG CNTER NS LX DISP (BAG) ×1
BASKET BONE COLLECTION (BASKET) IMPLANT
BENZOIN TINCTURE PRP APPL 2/3 (GAUZE/BANDAGES/DRESSINGS) ×1 IMPLANT
BLADE CLIPPER SURG (BLADE) IMPLANT
BUR MATCHSTICK NEURO 3.0 LAGG (BURR) ×1 IMPLANT
BUR PRECISION FLUTE 6.0 (BURR) ×1 IMPLANT
CAGE INTERBODY PL SHT 7X22.5 (Plate) IMPLANT
CANISTER SUCT 3000ML PPV (MISCELLANEOUS) ×1 IMPLANT
CAP LOCK DLX THRD (Cap) IMPLANT
CNTNR URN SCR LID CUP LEK RST (MISCELLANEOUS) ×1 IMPLANT
CONT SPEC 4OZ STRL OR WHT (MISCELLANEOUS) ×1
COVER BACK TABLE 24X17X13 BIG (DRAPES) IMPLANT
COVER BACK TABLE 60X90IN (DRAPES) ×1 IMPLANT
DRAPE C-ARM 42X72 X-RAY (DRAPES) ×2 IMPLANT
DRAPE HALF SHEET 40X57 (DRAPES) ×1 IMPLANT
DRAPE LAPAROTOMY 100X72X124 (DRAPES) ×1 IMPLANT
DRAPE SURG 17X23 STRL (DRAPES) ×4 IMPLANT
DRSG OPSITE POSTOP 4X6 (GAUZE/BANDAGES/DRESSINGS) IMPLANT
ELECT BLADE 4.0 EZ CLEAN MEGAD (MISCELLANEOUS) ×1
ELECT REM PT RETURN 9FT ADLT (ELECTROSURGICAL) ×1
ELECTRODE BLDE 4.0 EZ CLN MEGD (MISCELLANEOUS) ×1 IMPLANT
ELECTRODE REM PT RTRN 9FT ADLT (ELECTROSURGICAL) ×1 IMPLANT
GAUZE 4X4 16PLY ~~LOC~~+RFID DBL (SPONGE) ×1 IMPLANT
GAUZE SPONGE 4X4 12PLY STRL (GAUZE/BANDAGES/DRESSINGS) ×1 IMPLANT
GLOVE BIO SURGEON STRL SZ8 (GLOVE) ×2 IMPLANT
GLOVE BIO SURGEON STRL SZ8.5 (GLOVE) ×2 IMPLANT
GLOVE EXAM NITRILE XL STR (GLOVE) IMPLANT
GOWN STRL REUS W/ TWL LRG LVL3 (GOWN DISPOSABLE) IMPLANT
GOWN STRL REUS W/ TWL XL LVL3 (GOWN DISPOSABLE) ×2 IMPLANT
GOWN STRL REUS W/TWL 2XL LVL3 (GOWN DISPOSABLE) IMPLANT
GOWN STRL REUS W/TWL LRG LVL3 (GOWN DISPOSABLE)
GOWN STRL REUS W/TWL XL LVL3 (GOWN DISPOSABLE) ×2
HEMOSTAT POWDER KIT SURGIFOAM (HEMOSTASIS) IMPLANT
KIT BASIN OR (CUSTOM PROCEDURE TRAY) ×1 IMPLANT
KIT GRAFTMAG DEL NEURO DISP (NEUROSURGERY SUPPLIES) IMPLANT
KIT TURNOVER KIT B (KITS) ×1 IMPLANT
NDL HYPO 21X1.5 SAFETY (NEEDLE) IMPLANT
NDL HYPO 22X1.5 SAFETY MO (MISCELLANEOUS) ×1 IMPLANT
NEEDLE HYPO 21X1.5 SAFETY (NEEDLE) IMPLANT
NEEDLE HYPO 22X1.5 SAFETY MO (MISCELLANEOUS) ×1 IMPLANT
NS IRRIG 1000ML POUR BTL (IV SOLUTION) ×1 IMPLANT
PACK LAMINECTOMY NEURO (CUSTOM PROCEDURE TRAY) ×1 IMPLANT
PAD ARMBOARD 7.5X6 YLW CONV (MISCELLANEOUS) ×3 IMPLANT
PATTIES SURGICAL .5 X1 (DISPOSABLE) IMPLANT
PATTIES SURGICAL 1X1 (DISPOSABLE) IMPLANT
PUTTY DBM 10CC CALC GRAN (Putty) IMPLANT
ROD CURVED TI 6.35X35 (Rod) IMPLANT
ROD CURVED TI 6.35X45 (Rod) IMPLANT
SCREW PA CREO DLX 6.5X45 (Screw) IMPLANT
SCREW PA CREO DLX 6.5X50 (Screw) IMPLANT
SOLUTION IRRIG SURGIPHOR (IV SOLUTION) IMPLANT
SPONGE NEURO XRAY DETECT 1X3 (DISPOSABLE) IMPLANT
SPONGE SURGIFOAM ABS GEL 100 (HEMOSTASIS) ×1 IMPLANT
SPONGE T-LAP 4X18 ~~LOC~~+RFID (SPONGE) IMPLANT
STRIP CLOSURE SKIN 1/2X4 (GAUZE/BANDAGES/DRESSINGS) ×1 IMPLANT
SUT VIC AB 1 CT1 18XBRD ANBCTR (SUTURE) ×2 IMPLANT
SUT VIC AB 1 CT1 8-18 (SUTURE) ×2
SUT VIC AB 2-0 CP2 18 (SUTURE) ×2 IMPLANT
TOWEL GREEN STERILE (TOWEL DISPOSABLE) ×1 IMPLANT
TOWEL GREEN STERILE FF (TOWEL DISPOSABLE) ×1 IMPLANT
TRAY FOLEY MTR SLVR 16FR STAT (SET/KITS/TRAYS/PACK) ×1 IMPLANT
WATER STERILE IRR 1000ML POUR (IV SOLUTION) ×1 IMPLANT

## 2023-06-09 NOTE — Anesthesia Procedure Notes (Signed)
Procedure Name: Intubation Date/Time: 06/09/2023 10:20 AM  Performed by: Thomasene Ripple, CRNAPre-anesthesia Checklist: Patient identified, Emergency Drugs available, Suction available and Patient being monitored Patient Re-evaluated:Patient Re-evaluated prior to induction Oxygen Delivery Method: Circle System Utilized Preoxygenation: Pre-oxygenation with 100% oxygen Induction Type: IV induction Ventilation: Mask ventilation without difficulty Laryngoscope Size: Miller and 3 Grade View: Grade I Tube type: Oral Tube size: 7.0 mm Number of attempts: 1 Airway Equipment and Method: Stylet and Oral airway Placement Confirmation: ETT inserted through vocal cords under direct vision, positive ETCO2 and breath sounds checked- equal and bilateral Secured at: 21 cm Tube secured with: Tape Dental Injury: Teeth and Oropharynx as per pre-operative assessment

## 2023-06-09 NOTE — Transfer of Care (Signed)
Immediate Anesthesia Transfer of Care Note  Patient: Jocelyn Hooper  Procedure(s) Performed: TRANSFOAMINAL LUMBAR INTERBODY FUSION,INTERBODY PROSTHESIS,POSTERIOR INSTRUMENTATION LUMBAR FIVE-SACRAL ONE  Patient Location: PACU  Anesthesia Type:General  Level of Consciousness: awake, drowsy, and patient cooperative  Airway & Oxygen Therapy: Patient Spontanous Breathing and Patient connected to face mask oxygen  Post-op Assessment: Report given to RN, Post -op Vital signs reviewed and stable, and Patient moving all extremities  Post vital signs: Reviewed and stable  Last Vitals:  Vitals Value Taken Time  BP 137/88 06/09/23 1445  Temp    Pulse 82 06/09/23 1446  Resp 11 06/09/23 1446  SpO2 81 % 06/09/23 1446  Vitals shown include unfiled device data.  Last Pain:  Vitals:   06/09/23 0839  TempSrc:   PainSc: 6       Patients Stated Pain Goal: 5 (06/09/23 0839)  Complications: No notable events documented.

## 2023-06-09 NOTE — Anesthesia Postprocedure Evaluation (Signed)
Anesthesia Post Note  Patient: Alonza Bogus Pasternak  Procedure(s) Performed: TRANSFOAMINAL LUMBAR INTERBODY FUSION,INTERBODY PROSTHESIS,POSTERIOR INSTRUMENTATION LUMBAR FIVE-SACRAL ONE     Patient location during evaluation: PACU Anesthesia Type: General Level of consciousness: awake and alert Pain management: pain level controlled Vital Signs Assessment: post-procedure vital signs reviewed and stable Respiratory status: spontaneous breathing, nonlabored ventilation, respiratory function stable and patient connected to nasal cannula oxygen Cardiovascular status: blood pressure returned to baseline and stable Postop Assessment: no apparent nausea or vomiting Anesthetic complications: no  No notable events documented.  Last Vitals:  Vitals:   06/09/23 1530 06/09/23 1546  BP: (!) 116/95 127/71  Pulse: 97 (!) 104  Resp: 15 16  Temp: 36.4 C   SpO2: 94% 99%    Last Pain:  Vitals:   06/09/23 1726  TempSrc:   PainSc: 3                  Armany Mano S

## 2023-06-09 NOTE — Progress Notes (Signed)
Pharmacy Antibiotic Note  Jocelyn Hooper is a 82 y.o. female admitted on 06/09/2023 for spinal surgery.  Pharmacy has been consulted for Vancomycin dosing x 1 dose post-op for surgical prophylaxis.  Pt received Vancomycin 1gm IV pre-op at 0900.  Plan: With pt's wt, age, and renal function. The 1gm vancomycin that was given pre-op will cover 24 hour time period. No second post-op dose needed.  Pharmacy will sign off - please reconsult if needed  Height: 5\' 5"  (165.1 cm) Weight: 61.2 kg (135 lb) IBW/kg (Calculated) : 57  Temp (24hrs), Avg:97.5 F (36.4 C), Min:97 F (36.1 C), Max:97.9 F (36.6 C)  No results for input(s): "WBC", "CREATININE", "LATICACIDVEN", "VANCOTROUGH", "VANCOPEAK", "VANCORANDOM", "GENTTROUGH", "GENTPEAK", "GENTRANDOM", "TOBRATROUGH", "TOBRAPEAK", "TOBRARND", "AMIKACINPEAK", "AMIKACINTROU", "AMIKACIN" in the last 168 hours.  Estimated Creatinine Clearance: 36.9 mL/min (A) (by C-G formula based on SCr of 1.02 mg/dL (H)).    Allergies  Allergen Reactions   Avelox [Moxifloxacin Hcl In Nacl] Other (See Comments)    Redness all over in about 5 minutes after taking   Codeine Nausea Only   Acyclovir And Related     Turn red all over    Shrimp [Shellfish Allergy]     Hives     Ciprofloxacin Rash   Moxifloxacin Hives and Rash   Sucralfate Rash    Itching      Thank you for allowing pharmacy to be a part of this patient's care.  Christoper Fabian, PharmD, BCPS Please see amion for complete clinical pharmacist phone list 06/09/2023 5:11 PM

## 2023-06-09 NOTE — Op Note (Signed)
Brief history: The patient is an 82 year old white female who has complained of back and left leg pain consistent with neurogenic claudication/lumbosacral radiculopathy.  She failed medical management and was worked up with lumbar x-rays and lumbar MRI which demonstrated a degenerative scoliosis and lumbosacral spine listhesis with severe left neuroforaminal stenosis.  I discussed the various treatment options with the patient.  She has decided proceed with surgery.  Preoperative diagnosis: L5-S1 spondylolisthesis, degenerative disc disease, spinal stenosis compressing both the L5 and the S1 nerve roots; lumbago; lumbar radiculopathy; neurogenic claudication  Postoperative diagnosis: The same  Procedure: Left L5-S1 laminotomy/foraminotomies/ facetectomy to decompress the left L5 and S1 nerve roots(the work required to do this was in addition to the work required to do the posterior lumbar interbody fusion because of the patient's spinal stenosis, facet arthropathy. Etc. requiring a wide decompression of the nerve roots.); left L5-S1 transforaminal lumbar interbody fusion with local morselized autograft bone and Zimmer DBM; insertion of interbody prosthesis at L5-S1 on the left (Medtronic expandable interbody prosthesis); posterior nonsegmental instrumentation from L5 to S1 with globus titanium pedicle screws and rods; posterior lateral arthrodesis at L5-S1 with local morselized autograft bone and Zimmer DBM.  Surgeon: Dr. Delma Officer  Asst.: Hildred Priest, NP  Anesthesia: Gen. endotracheal  Estimated blood loss: 250 cc  Drains: None  Complications: None  Description of procedure: The patient was brought to the operating room by the anesthesia team. General endotracheal anesthesia was induced. The patient was turned to the prone position on the Wilson frame. The patient's lumbosacral region was then prepared with Betadine scrub and Betadine solution. Sterile drapes were applied.  I then  injected the area to be incised with Marcaine with epinephrine solution. I then used the scalpel to make a linear midline incision over the L5-S1 interspace. I then used electrocautery to perform a left subperiosteal dissection exposing the spinous process and lamina of 5 and S1. We then obtained intraoperative radiograph to confirm our location. We then inserted the Verstrac retractor to provide exposure.  I began the decompression by using the high speed drill to perform a left L5-S1 laminotomy. We then used the Kerrison punches to widen the laminotomy and removed the ligamentum flavum at L5-S1. We used the Kerrison punches to remove the left L5-S1 facet. We performed wide foraminotomies about the left L5 and S1 nerve roots completing the decompression.  We now turned our attention to the transforaminal lumbar interbody fusion. I used a scalpel to incise the intervertebral disc at L5-S1 on the left. I then performed a partial intervertebral discectomy at L5-S1 on the left using the pituitary forceps. We prepared the vertebral endplates at L5-S1 for the fusion by removing the soft tissues with the curettes. We then used the trial spacers to pick the appropriate sized interbody prosthesis. We prefilled his prosthesis with a combination of local morselized autograft bone that we obtained during the decompression as well as Zimmer DBM. We inserted the prefilled prosthesis into the interspace at L5-S1 on the left, we then expanded the prosthesis. There was a good snug fit of the prosthesis in the interspace. We then filled and the remainder of the intervertebral disc space with local morselized autograft bone and Zimmer DBM. This completed the posterior lumbar interbody arthrodesis.  During the decompression and insertion of the prosthesis the assistant protected the thecal sac and nerve roots with the D'Errico retractor.  We now turned attention to the instrumentation. Under fluoroscopic guidance we cannulated  the bilateral L5 and S1  pedicles with the bone probe. We then removed the bone probe. We then tapped the pedicle with a 5.5 millimeter tap. We then removed the tap. We probed inside the tapped pedicle with a ball probe to rule out cortical breaches. We then inserted a 6.5 x 50 millimeter pedicle screw into the L5 and S1 pedicles bilaterally under fluoroscopic guidance. We then palpated along the medial aspect of the pedicles to rule out cortical breaches. There were none. The nerve roots were not injured. We then connected the unilateral pedicle screws with a lordotic rod. We compressed the construct and secured the rod in place with the caps. We then tightened the caps appropriately. This completed the instrumentation from L5-S1 bilaterally.  We now turned our attention to the posterior lateral arthrodesis at L5-S1. We used the high-speed drill to decorticate the remainder of the facets, pars, transverse process at L5-S1. We then applied a combination of local morselized autograft bone and Zimmer DBM over these decorticated posterior lateral structures. This completed the posterior lateral arthrodesis.  We then obtained hemostasis using bipolar electrocautery. We irrigated the wound out with Betadine solution. We inspected the thecal sac and nerve roots and noted they were well decompressed. We then removed the retractor.  We injected Exparel . We reapproximated patient's thoracolumbar fascia with interrupted #1 Vicryl suture. We reapproximated patient's subcutaneous tissue with interrupted 2-0 Vicryl suture. The reapproximated patient's skin with Steri-Strips and benzoin. The wound was then coated with bacitracin ointment. A sterile dressing was applied. The drapes were removed. The patient was subsequently returned to the supine position where they were extubated by the anesthesia team. He was then transported to the post anesthesia care unit in stable condition. All sponge instrument and needle counts were  reportedly correct at the end of this case.

## 2023-06-09 NOTE — H&P (Signed)
Subjective: The patient is an 82 year old white female who has complained of back and left greater right leg pain consistent with lumbar radiculopathy.  She failed medical management and was worked up with a lumbar MRI which demonstrated an L5-S1 spondylolisthesis and foraminal stenosis.  I discussed the various treatment options with her.  She has decided to proceed with surgery.  Past Medical History:  Diagnosis Date   Bursitis of left shoulder    Chest pain, unspecified    Complication of anesthesia    PT HAS SEVERE SCOLIOSIS AND FOR TKR ANESTHESIA COULD NOT GET A SPINAL   Coronary artery disease    ascvd   CSF leak    DDD (degenerative disc disease), cervical    Diverticulosis    Esophageal reflux    Fibrocystic breast disease    GERD (gastroesophageal reflux disease)    Headache    H/O   History of colon polyps    History of hiatal hernia    History of kidney stones    IBS (irritable bowel syndrome)    Nasal polyps    Other and unspecified hyperlipidemia    Scoliosis    Tinnitus of both ears    Trigeminal neuralgia syndrome    Unspecified sinusitis (chronic)    Vitamin B12 deficiency     Past Surgical History:  Procedure Laterality Date   ABDOMINAL HYSTERECTOMY     APPENDECTOMY     BACK SURGERY  1982   disc   CARDIAC CATHETERIZATION     [facility]    CHOLECYSTECTOMY  2002   COLONOSCOPY WITH PROPOFOL N/A 03/10/2017   Procedure: COLONOSCOPY WITH PROPOFOL;  Surgeon: Scot Jun, MD;  Location: [facility] ENDOSCOPY;  Service: Endoscopy;  Laterality: N/A;   COLONOSCOPY WITH PROPOFOL N/A 01/13/2021   Procedure: COLONOSCOPY WITH PROPOFOL;  Surgeon: Toledo, Boykin Nearing, MD;  Location: [facility] ENDOSCOPY;  Service: Gastroenterology;  Laterality: N/A;   ESOPHAGOGASTRODUODENOSCOPY (EGD) WITH PROPOFOL N/A 01/13/2021   Procedure: ESOPHAGOGASTRODUODENOSCOPY (EGD) WITH PROPOFOL;  Surgeon: Toledo, Boykin Nearing, MD;  Location: [facility] ENDOSCOPY;  Service: Gastroenterology;  Laterality: N/A;    EXTRACORPOREAL SHOCK WAVE LITHOTRIPSY Right 01/09/2021   Procedure: EXTRACORPOREAL SHOCK WAVE LITHOTRIPSY (ESWL);  Surgeon: Vanna Scotland, MD;  Location: [facility] ORS;  Service: Urology;  Laterality: Right;   EYE SURGERY Bilateral    Cataract Extraction with IOL   hysterectomy (other)  1974   JOINT REPLACEMENT     LEFT knee   KNEE ARTHROPLASTY Right 09/27/2017   Procedure: COMPUTER ASSISTED TOTAL KNEE ARTHROPLASTY;  Surgeon: Donato Heinz, MD;  Location: [facility] ORS;  Service: Orthopedics;  Laterality: Right;   KNEE ARTHROSCOPY Right 09/13/2015   Procedure: ARTHROSCOPY KNEE, PARTIAL LATERAL MENISECTOMY, CHONDROPLASTY ;  Surgeon: Donato Heinz, MD;  Location: [facility] ORS;  Service: Orthopedics;  Laterality: Right;   KNEE SURGERY  2004   having a lot of problems with left knee   NASAL SINUS SURGERY  1967, 1988, 1993, 2003, 2012   sinus procedures  1970,1990,1994,2005   TONSILLECTOMY  2015    Allergies  Allergen Reactions   Avelox [Moxifloxacin Hcl In Nacl] Other (See Comments)    Redness all over in about 5 minutes after taking   Codeine Nausea Only   Acyclovir And Related     Turn red all over    Shrimp [Shellfish Allergy]     Hives     Ciprofloxacin Rash   Moxifloxacin Hives and Rash   Sucralfate Rash    Itching     Social  History   Tobacco Use   Smoking status: Never   Smokeless tobacco: Never  Substance Use Topics   Alcohol use: No    Family History  Problem Relation Age of Onset   Heart attack Mother 96   Heart failure Mother    Heart attack Father    Heart disease Father        CABG   Heart attack Brother 19       premature   Breast cancer Sister 55   Prior to Admission medications   Medication Sig Start Date End Date Taking? Authorizing Provider  aspirin EC 81 MG tablet Take 81 mg by mouth daily.   Yes [provider]  Calcium Carbonate-Vitamin D 600-400 MG-UNIT tablet Take 1 tablet by mouth 2 (two) times daily.   Yes [provider]   cetirizine (ZYRTEC) 10 MG tablet Take 10 mg by mouth daily.   Yes [provider]  estradiol (ESTRACE) 0.5 MG tablet Take 0.5 mg by mouth daily.   Yes [provider]  fluticasone (FLONASE) 50 MCG/ACT nasal spray Place 1 spray into both nostrils in the morning and at bedtime. 01/09/14  Yes [provider]  gabapentin (NEURONTIN) 300 MG capsule Take 300 mg by mouth at bedtime.    Yes [provider]  HYDROcodone-acetaminophen (NORCO/VICODIN) 5-325 MG tablet Take 1 tablet by mouth every 6 (six) hours as needed for moderate pain. 09/02/22  Yes Sharyn Creamer, MD  lansoprazole (PREVACID) 30 MG capsule Take 30 mg by mouth daily. 10/06/21  Yes [provider]  Melatonin 10 MG TABS Take 10 mg by mouth at bedtime.   Yes [provider]  topiramate (TOPAMAX) 50 MG tablet Take 150 mg by mouth daily. 07/26/19  Yes [provider]  vitamin C (ASCORBIC ACID) 500 MG tablet Take 1,000 mg by mouth 2 (two) times daily.   Yes [provider]  metoCLOPramide (REGLAN) 5 MG tablet Take 5 mg by mouth every 8 (eight) hours as needed for vomiting or nausea. 11/13/20   [provider]  oxybutynin (DITROPAN) 5 MG tablet Take 1 tablet (5 mg total) by mouth daily. Patient not taking: Reported on 05/20/2023 04/27/23   Michiel Cowboy A, PA-C     Review of Systems  Positive ROS: As above  All other systems have been reviewed and were otherwise negative with the exception of those mentioned in the HPI and as above.  Objective: Vital signs in last 24 hours: Temp:  [97.9 F (36.6 C)] 97.9 F (36.6 C) (09/25 0824) Pulse Rate:  [95] 95 (09/25 0824) Resp:  [18] 18 (09/25 0824) BP: (132)/(75) 132/75 (09/25 0824) SpO2:  [95 %] 95 % (09/25 0824) Weight:  [61.2 kg] 61.2 kg (09/25 0824) Estimated body mass index is 22.47 kg/m as calculated from the following:   Height as of this encounter: 5\' 5"  (1.651 m).   Weight as of this encounter: 61.2  kg.   General Appearance: Alert Head: Normocephalic, without obvious abnormality, atraumatic Eyes: PERRL, conjunctiva/corneas clear, EOM's intact,    Ears: Normal  Throat: Normal  Neck: Supple, Back: unremarkable Lungs: Clear to auscultation bilaterally, respirations unlabored Heart: Regular rate and rhythm, no murmur, rub or gallop Abdomen: Soft, non-tender Extremities: Extremities normal, atraumatic, no cyanosis or edema Skin: unremarkable  NEUROLOGIC:   Mental status: alert and oriented,Motor Exam - grossly normal Sensory Exam - grossly normal Reflexes:  Coordination - grossly normal Gait - grossly normal Balance - grossly normal Cranial Nerves: I: smell  Not tested  II: visual acuity  OS: Normal  OD: Normal   II: visual fields Full to confrontation  II: pupils Equal, round, reactive to light  III,VII: ptosis None  III,IV,VI: extraocular muscles  Full ROM  V: mastication Normal  V: facial light touch sensation  Normal  V,VII: corneal reflex  Present  VII: facial muscle function - upper  Normal  VII: facial muscle function - lower Normal  VIII: hearing Not tested  IX: soft palate elevation  Normal  IX,X: gag reflex Present  XI: trapezius strength  5/5  XI: sternocleidomastoid strength 5/5  XI: neck flexion strength  5/5  XII: tongue strength  Normal    Data Review Lab Results  Component Value Date   WBC 6.5 09/16/2017   HGB 14.1 09/16/2017   HCT 42.2 09/16/2017   MCV 95.2 09/16/2017   PLT 313 09/12/2020   Lab Results  Component Value Date   NA 134 (L) 06/01/2023   K 4.2 06/01/2023   CL 108 06/01/2023   CO2 20 (L) 06/01/2023   BUN 16 06/01/2023   CREATININE 1.02 (H) 06/01/2023   GLUCOSE 87 06/01/2023   Lab Results  Component Value Date   INR 0.93 09/16/2017    Assessment/Plan: L5-S1 spondylolisthesis, foraminal stenosis, radiculopathy, lumbago: I discussed the situation with the patient.  I reviewed her imaging studies with her and pointed out the  abnormalities.  I have discussed the various treatment options including surgery.  I described the surgical treatment option of an L5-S1 facetectomy instrumentation and fusion.  I have shown her surgical models.  I have given her a surgical pamphlet.  We have discussed the risk, benefits, alternatives, expected postoperative course, and likelihood of achieving her goals with surgery.  I have answered all her questions.  She has decided proceed with surgery.   Cristi Loron 06/09/2023 9:23 AM

## 2023-06-10 DIAGNOSIS — M4317 Spondylolisthesis, lumbosacral region: Secondary | ICD-10-CM | POA: Diagnosis not present

## 2023-06-10 MED ORDER — OXYCODONE-ACETAMINOPHEN 5-325 MG PO TABS: 1.0000 | ORAL_TABLET | ORAL | Status: DC | PRN

## 2023-06-10 MED ORDER — OXYCODONE-ACETAMINOPHEN 5-325 MG PO TABS: 1.0000 | ORAL_TABLET | ORAL | 0 refills | Status: DC | PRN

## 2023-06-10 MED ORDER — DOCUSATE SODIUM 100 MG PO CAPS: 100.0000 mg | ORAL_CAPSULE | Freq: Two times a day (BID) | ORAL | 0 refills | Status: AC

## 2023-06-10 MED ORDER — CYCLOBENZAPRINE HCL 5 MG PO TABS: 5.0000 mg | ORAL_TABLET | Freq: Three times a day (TID) | ORAL | 0 refills | Status: AC | PRN

## 2023-06-10 MED FILL — Thrombin For Soln 5000 Unit: CUTANEOUS | Qty: 5000 | Status: AC

## 2023-06-10 NOTE — Discharge Summary (Signed)
Physician Discharge Summary  Patient ID: Jocelyn Hooper MRN: [id number] DOB/AGE: [birth year] 82 y.o.  Admit date: 06/09/2023 Discharge date: 06/10/2023  Admission Diagnoses: Thoracolumbar scoliosis, lumbosacral spinal thesis, lumbosacral radiculopathy, lumbago  Discharge Diagnoses: The same Principal Problem:   Spondylolisthesis of lumbosacral region   Discharged Condition: good  Hospital Course: I performed an L5-S1 decompression, instrumentation and fusion on the patient on 06/09/2023.  The surgery went well.  The patient's postoperative course was unremarkable.  On postoperative day #1 she requested discharge home.  The patient, and her husband, were given verbal and written discharge instructions.  All questions were answered.  Consults: PT, care management Significant Diagnostic Studies: None Treatments: L5-S1 decompression, instrumentation and fusion. Discharge Exam: Blood pressure (!) 106/54, pulse (!) 103, temperature 98.9 F (37.2 C), temperature source Oral, resp. rate 16, height 5\' 5"  (1.651 m), weight 61.2 kg, SpO2 100%. The patient is alert and pleasant.  Her strength is normal.  Disposition: Home  Discharge Instructions     Call MD for:  difficulty breathing, headache or visual disturbances   Complete by: As directed    Call MD for:  extreme fatigue   Complete by: As directed    Call MD for:  hives   Complete by: As directed    Call MD for:  persistant dizziness or light-headedness   Complete by: As directed    Call MD for:  persistant nausea and vomiting   Complete by: As directed    Call MD for:  redness, tenderness, or signs of infection (pain, swelling, redness, odor or green/yellow discharge around incision site)   Complete by: As directed    Call MD for:  severe uncontrolled pain   Complete by: As directed    Call MD for:  temperature >100.4   Complete by: As directed    Diet - low sodium heart healthy   Complete by: As directed    Discharge  instructions   Complete by: As directed    Call [phone] for a followup appointment. Take a stool softener while you are using pain medications.   Driving Restrictions   Complete by: As directed    Do not drive for 2 weeks.   Increase activity slowly   Complete by: As directed    Lifting restrictions   Complete by: As directed    Do not lift more than 5 pounds. No excessive bending or twisting.   May shower / Bathe   Complete by: As directed    Remove the dressing for 3 days after surgery.  You may shower, but leave the incision alone.   Remove dressing in 48 hours   Complete by: As directed       Allergies as of 06/10/2023       Reactions   Avelox [moxifloxacin Hcl In Nacl] Other (See Comments)   Redness all over in about 5 minutes after taking   Codeine Nausea Only   Acyclovir And Related    Turn red all over    Shrimp [shellfish Allergy]    Hives    Ciprofloxacin Rash   Moxifloxacin Hives, Rash   Sucralfate Rash   Itching         Medication List     STOP taking these medications    HYDROcodone-acetaminophen 5-325 MG tablet Commonly known as: NORCO/VICODIN       TAKE these medications    ascorbic acid 500 MG tablet Commonly known as: VITAMIN C Take 1,000 mg by mouth 2 (two) times daily.  aspirin EC 81 MG tablet Take 81 mg by mouth daily.   Calcium Carbonate-Vitamin D 600-400 MG-UNIT tablet Take 1 tablet by mouth 2 (two) times daily.   cetirizine 10 MG tablet Commonly known as: ZYRTEC Take 10 mg by mouth daily.   cyclobenzaprine 5 MG tablet Commonly known as: FLEXERIL Take 1 tablet (5 mg total) by mouth 3 (three) times daily as needed for muscle spasms.   docusate sodium 100 MG capsule Commonly known as: COLACE Take 1 capsule (100 mg total) by mouth 2 (two) times daily.   estradiol 0.5 MG tablet Commonly known as: ESTRACE Take 0.5 mg by mouth daily.   fluticasone 50 MCG/ACT nasal spray Commonly known as: FLONASE Place 1 spray into  both nostrils in the morning and at bedtime.   gabapentin 300 MG capsule Commonly known as: NEURONTIN Take 300 mg by mouth at bedtime.   lansoprazole 30 MG capsule Commonly known as: PREVACID Take 30 mg by mouth daily.   Melatonin 10 MG Tabs Take 10 mg by mouth at bedtime.   metoCLOPramide 5 MG tablet Commonly known as: REGLAN Take 5 mg by mouth every 8 (eight) hours as needed for vomiting or nausea.   oxybutynin 5 MG tablet Commonly known as: DITROPAN Take 1 tablet (5 mg total) by mouth daily.   oxyCODONE-acetaminophen 5-325 MG tablet Commonly known as: PERCOCET/ROXICET Take 1 tablet by mouth every 4 (four) hours as needed for moderate pain.   topiramate 50 MG tablet Commonly known as: TOPAMAX Take 150 mg by mouth daily.               Durable Medical Equipment  (From admission, onward)           Start     Ordered   06/10/23 0948  For home use only DME 3 n 1  Once        06/10/23 0948             Signed: Cristi Loron 06/10/2023, 1:42 PM

## 2023-06-10 NOTE — Progress Notes (Signed)
Patient alert and oriented, void, ambulate. Surgical site dry with minimal old drainage no sign of infections. D/c instructions explain to the patient and husband all questions answered. Patient d/c home with 3 in 1 per order.

## 2023-06-10 NOTE — Plan of Care (Signed)

## 2023-06-10 NOTE — Evaluation (Signed)
Physical Therapy Evaluation Patient Details Name: Jocelyn Hooper MRN: [id number] DOB: September 13, 1941 Today's Date: 06/10/2023  History of Present Illness  Pt is an 82 y/o female who presents s/p L5-S1 TLIF on 06/09/2023. PMH signficant for L shoulder bursitis, CAD, CSF leak, IBS, Scoliosis, Trigeminal neuralgia, prior back surgery 1982, B TKR.  Clinical Impression  Pt admitted with above diagnosis. At the time of PT eval, pt was able to demonstrate transfers and ambulation with up to mod assist and RW for support. Pt was educated on precautions, brace application/wearing schedule, appropriate activity progression, and car transfer. Recommend OT evaluation for education and ADL instruction. Pt currently with functional limitations due to the deficits listed below (see PT Problem List). Pt will benefit from skilled PT to increase their independence and safety with mobility to allow discharge to the venue listed below.          If plan is discharge home, recommend the following: A little help with walking and/or transfers;A little help with bathing/dressing/bathroom;Assist for transportation;Help with stairs or ramp for entrance   Can travel by private vehicle        Equipment Recommendations None recommended by PT  Recommendations for Other Services  OT consult    Functional Status Assessment Patient has had a recent decline in their functional status and demonstrates the ability to make significant improvements in function in a reasonable and predictable amount of time.     Precautions / Restrictions Precautions Precautions: Fall;Back Precaution Booklet Issued: Yes (comment) Precaution Comments: Reviewed handout and pt was cued for precautions during functional mobility. Required Braces or Orthoses: Spinal Brace Spinal Brace: Lumbar corset;Applied in sitting position Restrictions Weight Bearing Restrictions: No      Mobility  Bed Mobility Overal bed mobility: Needs Assistance Bed  Mobility: Rolling, Sidelying to Sit Rolling: Mod assist Sidelying to sit: Mod assist       General bed mobility comments: Assist for full roll with bed pad to assist, and for trunk elevation to full sitting position. Increased time and VC's for optimal log roll technique throughout.    Transfers Overall transfer level: Needs assistance Equipment used: Rolling walker (2 wheels) Transfers: Sit to/from Stand Sit to Stand: Min assist           General transfer comment: Assist for power up to full stand. Pt with grossly flexed trunk and VC's throughout for hand placement on seated surface for safety.    Ambulation/Gait Ambulation/Gait assistance: Contact guard assist Gait Distance (Feet): 200 Feet Assistive device: Rolling walker (2 wheels) Gait Pattern/deviations: Step-through pattern, Decreased stride length, Trunk flexed, Narrow base of support Gait velocity: Decreased Gait velocity interpretation: <1.8 ft/sec, indicate of risk for recurrent falls   General Gait Details: VC's for improved posture, closer walker proximity and forward gaze. No assist required however hands on guarding provided throughout gait training.  Stairs Stairs: Yes Stairs assistance: Contact guard assist Stair Management: One rail Left, Step to pattern, Forwards Number of Stairs: 3 General stair comments: VC's for sequencing and general safety.  Wheelchair Mobility     Tilt Bed    Modified Rankin (Stroke Patients Only)       Balance Overall balance assessment: Needs assistance Sitting-balance support: Feet supported, No upper extremity supported Sitting balance-Leahy Scale: Fair Sitting balance - Comments: Significant trunk flexion in sitting   Standing balance support: Bilateral upper extremity supported, During functional activity, Reliant on assistive device for balance Standing balance-Leahy Scale: Poor  Pertinent Vitals/Pain Pain  Assessment Pain Assessment: Faces Faces Pain Scale: Hurts little more Pain Location: Back/incision site Pain Descriptors / Indicators: Operative site guarding, Sore Pain Intervention(s): Limited activity within patient's tolerance, Monitored during session, Repositioned    Home Living Family/patient expects to be discharged to:: Private residence Living Arrangements: Spouse/significant other;Other relatives Available Help at Discharge: Family;Available 24 hours/day Type of Home: House Home Access: Stairs to enter Entrance Stairs-Rails: Left Entrance Stairs-Number of Steps: 3   Home Layout: One level;Laundry or work area in basement;Able to live on main level with bedroom/bathroom Home Equipment: Agricultural consultant (2 wheels);Cane - single point;Adaptive equipment      Prior Function Prior Level of Function : Independent/Modified Independent             Mobility Comments: Pt reports she was not utilizing an AD PTA. ADLs Comments: Pt states her husband was assisting her with ADL's after her knee replacements but she returned to independence. Husband now assists by taking laundry out of the washer/dryer for her.     Extremity/Trunk Assessment   Upper Extremity Assessment Upper Extremity Assessment: Defer to OT evaluation    Lower Extremity Assessment Lower Extremity Assessment: Generalized weakness    Cervical / Trunk Assessment Cervical / Trunk Assessment: Kyphotic (Scoliotic)  Communication   Communication Communication: No apparent difficulties  Cognition Arousal: Alert Behavior During Therapy: WFL for tasks assessed/performed Overall Cognitive Status: Within Functional Limits for tasks assessed                                          General Comments      Exercises     Assessment/Plan    PT Assessment Patient needs continued PT services  PT Problem List Decreased strength;Decreased balance;Decreased activity tolerance;Decreased  mobility;Decreased knowledge of use of DME;Decreased safety awareness;Decreased knowledge of precautions;Pain       PT Treatment Interventions DME instruction;Gait training;Stair training;Functional mobility training;Therapeutic activities;Therapeutic exercise;Balance training;Patient/family education    PT Goals (Current goals can be found in the Care Plan section)  Acute Rehab PT Goals Patient Stated Goal: Feel better PT Goal Formulation: With patient Time For Goal Achievement: 06/17/23 Potential to Achieve Goals: Good    Frequency Min 1X/week     Co-evaluation               AM-PAC PT "6 Clicks" Mobility  Outcome Measure Help needed turning from your back to your side while in a flat bed without using bedrails?: A Lot Help needed moving from lying on your back to sitting on the side of a flat bed without using bedrails?: A Lot Help needed moving to and from a bed to a chair (including a wheelchair)?: A Little Help needed standing up from a chair using your arms (e.g., wheelchair or bedside chair)?: A Little Help needed to walk in hospital room?: A Little Help needed climbing 3-5 steps with a railing? : A Little 6 Click Score: 16    End of Session Equipment Utilized During Treatment: Gait belt;Back brace Activity Tolerance: Patient tolerated treatment well Patient left: in bed;with call bell/phone within reach Nurse Communication: Mobility status PT Visit Diagnosis: Unsteadiness on feet (R26.81);Pain Pain - part of body:  (back)    Time: 6063-0160 PT Time Calculation (min) (ACUTE ONLY): 22 min   Charges:   PT Evaluation $PT Eval Low Complexity: 1 Low   PT General Charges $$ ACUTE  PT VISIT: 1 Visit         Conni Slipper, PT, DPT Acute Rehabilitation Services Secure Chat Preferred Office: [phone]   Marylynn Pearson 06/10/2023, 12:00 PM

## 2023-06-10 NOTE — Evaluation (Signed)
Occupational Therapy Evaluation Patient Details Name: Jocelyn Hooper MRN: [id number] DOB: 21-Dec-1940 Today's Date: 06/10/2023   History of Present Illness Pt is an 82 y/o female who presents s/p L5-S1 TLIF on 06/09/2023. PMH signficant for L shoulder bursitis, CAD, CSF leak, IBS, Scoliosis, Trigeminal neuralgia, prior back surgery 1982, B TKR.   Clinical Impression   Patient admitted for above and presents with problem list below.  PTA pt was independent with ADLs, mobility. Patient was educated on brace mgmt and wear schedule, back precautions, ADL compensatory techniques, AE/DME, mobility progression, safety and recommendations.  Today, pt demonstrated ability to complete bed mobility with mod assist, transfers using RW with min assist, functional mobility using RW with min assist to min guard, and ADLs with up to mod assist.  At discharge, pt will have support from spouse as needed.  Based on performance today, pt will benefit from further OT services acutely and after dc at [facility] level to optimize independence, safety and return to PLOF.  OT will follow acutely.        If plan is discharge home, recommend the following: A little help with walking and/or transfers;A lot of help with bathing/dressing/bathroom;Assistance with cooking/housework;Assist for transportation;Help with stairs or ramp for entrance    Functional Status Assessment  Patient has had a recent decline in their functional status and demonstrates the ability to make significant improvements in function in a reasonable and predictable amount of time.  Equipment Recommendations  BSC/3in1    Recommendations for Other Services       Precautions / Restrictions Precautions Precautions: Fall;Back Precaution Booklet Issued: Yes (comment) Precaution Comments: Reviewed handout and pt was cued for precautions during functional mobility. Required Braces or Orthoses: Spinal Brace Spinal Brace: Lumbar corset;Applied in sitting  position Restrictions Weight Bearing Restrictions: No      Mobility Bed Mobility Overal bed mobility: Needs Assistance Bed Mobility: Rolling, Sit to Sidelying Rolling: Min assist       Sit to sidelying: Mod assist General bed mobility comments: Cueing for technique, assist for guiding trunk and fully bringing LEs into bed before rolling to back    Transfers Overall transfer level: Needs assistance Equipment used: Rolling walker (2 wheels) Transfers: Sit to/from Stand Sit to Stand: Min assist           General transfer comment: Assist for power up to full stand. Pt with grossly flexed trunk and VC's throughout for hand placement on seated surface for safety.      Balance Overall balance assessment: Needs assistance Sitting-balance support: Feet supported, No upper extremity supported Sitting balance-Leahy Scale: Fair Sitting balance - Comments: Significant trunk flexion in sitting   Standing balance support: Bilateral upper extremity supported, During functional activity, Reliant on assistive device for balance Standing balance-Leahy Scale: Poor Standing balance comment: brief moments without UE support but relies on external support                           ADL either performed or assessed with clinical judgement   ADL Overall ADL's : Needs assistance/impaired     Grooming: Contact guard assist;Standing           Upper Body Dressing : Minimal assistance;Sitting   Lower Body Dressing: Sit to/from stand;Moderate assistance Lower Body Dressing Details (indicate cue type and reason): to fully thread LEs into pants, would need assist for socks.  Discussed compensatory techniques and use of reacher. Toilet Transfer: Minimal assistance;Ambulation;Rolling walker (2 wheels)  Toileting- Clothing Manipulation and Hygiene: Minimal assistance;Sit to/from stand       Functional mobility during ADLs: Minimal assistance;Rolling walker (2 wheels);Cueing for  safety       Vision   Vision Assessment?: No apparent visual deficits     Perception         Praxis         Pertinent Vitals/Pain Pain Assessment Pain Assessment: Faces Faces Pain Scale: Hurts little more Pain Location: Back/incision site Pain Descriptors / Indicators: Operative site guarding, Sore Pain Intervention(s): Limited activity within patient's tolerance, Monitored during session, Repositioned     Extremity/Trunk Assessment Upper Extremity Assessment Upper Extremity Assessment: Generalized weakness   Lower Extremity Assessment Lower Extremity Assessment: Defer to PT evaluation   Cervical / Trunk Assessment Cervical / Trunk Assessment: Kyphotic (Scoliotic)   Communication Communication Communication: No apparent difficulties   Cognition Arousal: Alert Behavior During Therapy: WFL for tasks assessed/performed Overall Cognitive Status: Within Functional Limits for tasks assessed                                       General Comments       Exercises     Shoulder Instructions      Home Living Family/patient expects to be discharged to:: Private residence Living Arrangements: Spouse/significant other;Other relatives Available Help at Discharge: Family;Available 24 hours/day Type of Home: House Home Access: Stairs to enter [employer] of Steps: 3 Entrance Stairs-Rails: Left Home Layout: One level;Laundry or work area in basement;Able to live on main level with bedroom/bathroom     Bathroom Shower/Tub: Producer, television/film/video: Handicapped height     Home Equipment: Agricultural consultant (2 wheels);Cane - single point;Adaptive equipment Adaptive Equipment: Reacher;Long-handled sponge        Prior Functioning/Environment Prior Level of Function : Independent/Modified Independent             Mobility Comments: Pt reports she was not utilizing an AD PTA. ADLs Comments: Pt states her husband was assisting her  with ADL's after her knee replacements but she returned to independence. Husband now assists by taking laundry out of the washer/dryer for her. SHe reports she is very independnet, was doing yardwork yesterday prior to surgery.        OT Problem List: Decreased strength;Decreased activity tolerance;Impaired balance (sitting and/or standing);Pain;Decreased knowledge of use of DME or AE;Decreased knowledge of precautions      OT Treatment/Interventions: Self-care/ADL training;Therapeutic exercise;DME and/or AE instruction;Therapeutic activities;Patient/family education;Balance training    OT Goals(Current goals can be found in the care plan section) Acute Rehab OT Goals Patient Stated Goal: home OT Goal Formulation: With patient Time For Goal Achievement: 06/24/23 Potential to Achieve Goals: Good ADL Goals Pt Will Perform Grooming: with modified independence;standing Pt Will Perform Lower Body Dressing: with modified independence;sit to/from stand;with adaptive equipment Pt Will Transfer to Toilet: ambulating;with supervision Pt Will Perform Toileting - Clothing Manipulation and hygiene: with modified independence;sitting/lateral leans;sit to/from stand Pt Will Perform Tub/Shower Transfer: Shower transfer;with supervision;3 in 1;ambulating;rolling walker  OT Frequency: Min 1X/week    Co-evaluation              AM-PAC OT "6 Clicks" Daily Activity     Outcome Measure Help from another person eating meals?: None Help from another person taking care of personal grooming?: A Little Help from another person toileting, which includes using toliet, bedpan, or urinal?: A Little Help  from another person bathing (including washing, rinsing, drying)?: A Lot Help from another person to put on and taking off regular upper body clothing?: A Little Help from another person to put on and taking off regular lower body clothing?: A Lot 6 Click Score: 17   End of Session Equipment Utilized During  Treatment: Rolling walker (2 wheels);Back brace Nurse Communication: Mobility status  Activity Tolerance: Patient tolerated treatment well Patient left: in bed;with call bell/phone within reach  OT Visit Diagnosis: Other abnormalities of gait and mobility (R26.89);Pain Pain - part of body:  (back)                Time: 4098-1191 OT Time Calculation (min): 20 min Charges:  OT General Charges $OT Visit: 1 Visit OT Evaluation $OT Eval Low Complexity: 1 Low  Barry Brunner, OT Acute Rehabilitation Services Office [phone]   Chancy Milroy 06/10/2023, 1:04 PM

## 2023-06-10 NOTE — TOC Transition Note (Signed)
Transition of Care [facility]) - CM/SW Discharge Note   Patient Details  Name: Jocelyn Hooper MRN: [id number] Date of Birth: [birth year]  [facility] CM/SW Contact:  Kermit Balo, RN Phone Number: 06/10/2023, 1:59 PM   Clinical Narrative:     Patient is discharging home with home health therapies through [facility]. Information on her AVS. Iantha Fallen will contact her for the first home visit. Pt has transportation home.  Final next level of care: Home w Home Health Services Barriers to Discharge: No Barriers Identified   Patient Goals and CMS Choice CMS Medicare.gov Compare Post Acute Care list provided to:: Patient Choice offered to / list presented to : Patient  Discharge Placement                         Discharge Plan and Services Additional resources added to the After Visit Summary for                            [facility] Arranged: PT, OT [facility] Agency: [facility] Date [facility] Agency Contacted: 06/10/23   Representative spoke with at [facility] Agency: Amy  Social Determinants of Health (SDOH) Interventions SDOH Screenings   Food Insecurity: No Food Insecurity (10/27/2022)   Received from [facility], [facility]  Transportation Needs: No Transportation Needs (10/27/2022)   Received from [facility], [facility]  Utilities: Not At Risk (10/27/2022)   Received from [facility], [facility]  Financial Resource Strain: Low Risk  (10/27/2022)   Received from [facility], [facility]  Tobacco Use: Low Risk  (06/09/2023)     Readmission Risk Interventions     No data to display

## 2023-06-12 DIAGNOSIS — Z4789 Encounter for other orthopedic aftercare: Secondary | ICD-10-CM | POA: Diagnosis not present

## 2023-06-12 DIAGNOSIS — Z981 Arthrodesis status: Secondary | ICD-10-CM | POA: Diagnosis not present

## 2023-06-12 DIAGNOSIS — K219 Gastro-esophageal reflux disease without esophagitis: Secondary | ICD-10-CM | POA: Diagnosis not present

## 2023-06-12 DIAGNOSIS — Z7982 Long term (current) use of aspirin: Secondary | ICD-10-CM | POA: Diagnosis not present

## 2023-06-12 DIAGNOSIS — Z79891 Long term (current) use of opiate analgesic: Secondary | ICD-10-CM | POA: Diagnosis not present

## 2023-06-12 DIAGNOSIS — K589 Irritable bowel syndrome without diarrhea: Secondary | ICD-10-CM | POA: Diagnosis not present

## 2023-06-12 DIAGNOSIS — I2510 Atherosclerotic heart disease of native coronary artery without angina pectoris: Secondary | ICD-10-CM | POA: Diagnosis not present

## 2023-06-14 ENCOUNTER — Encounter (HOSPITAL_COMMUNITY): Payer: Self-pay | Admitting: Neurosurgery

## 2023-06-16 ENCOUNTER — Telehealth: Payer: Self-pay | Admitting: Cardiovascular Disease

## 2023-06-16 MED FILL — Heparin Sodium (Porcine) Inj 1000 Unit/ML: INTRAMUSCULAR | Qty: 30 | Status: AC

## 2023-06-16 MED FILL — Sodium Chloride IV Soln 0.9%: INTRAVENOUS | Qty: 1000 | Status: AC

## 2023-06-16 NOTE — Telephone Encounter (Signed)
Spoke w/ Irving Burton, [facility] PT who came out to see pt this am. This is Emily's first visit w/ pt after back surgery on 9/25, though HH has been out this weekend.  Weekend PT reported pt's resting HR at 128. They called pt's surgeon who recommended pt proceed to ED, but family declined. Her daughter is a Therapist, music and is in the home w/ her.  Pt fell twice over the weekend, reports that she lost her balance; denies any lightheadedness or faintness. She was given oxycodone after surgery, but daughter was concerned this may have contributed to falls, so the d/c'd this and pt is only taking tylenol for pain. Pt today is 3/10. BP on Sat 100/62 , P 128 Yesterday 126/80, P 107 Today 102/94, P 108 Pt is fairly new to them, as well as our office, but previous MD notes show HRs in the 70s. Pt reports it has always been high, but there is no noted indication of this. PT is concerned she may be dehydrated and have encouraged her to drink more water. Pt is not on BB, so would like Dr. Windell Hummingbird input Daughter is esp worried about elevated HR. Advised her that I will make him aware of her concerns and call her back w/ his recommendations.

## 2023-06-16 NOTE — Telephone Encounter (Signed)
STAT if HR is under 50 or over 120 (normal HR is 60-100 beats per minute)  What is your heart rate?  This weekend: 128  Today: 108    Do you have any other symptoms? No     Irving Burton, PT with [facility] called in stating pt's HR has been high. She states she looked back at her previous OV and it was in the 70's but now it been running in the 100's. She states she also advised the pt to drink more water but she hasn't been drinking much lately. Please advise.

## 2023-06-17 NOTE — Telephone Encounter (Signed)
Called and spoke with Jocelyn Hooper. Notified her of the following from Dr. Mariah Milling.  hard to say what is going on Pain, blood loss from surgery, infection as well as dehydration can cause elevated heart rate Blood pressure low, would not use a beta-blocker which could push blood pressure lower Pain medication can cause lower blood pressure If blood pressure and heart rate do not start to improve as recovery progresses, Would consider some lab work Hopefully they have postop check with neurosurgery to check incision site Thx [name]   Verbalizes understanding.

## 2023-06-17 NOTE — Telephone Encounter (Signed)
Left message for call back.

## 2023-06-18 DIAGNOSIS — M5136 Other intervertebral disc degeneration, lumbar region with discogenic back pain only: Secondary | ICD-10-CM | POA: Diagnosis not present

## 2023-06-18 DIAGNOSIS — R531 Weakness: Secondary | ICD-10-CM | POA: Diagnosis not present

## 2023-07-01 DIAGNOSIS — D500 Iron deficiency anemia secondary to blood loss (chronic): Secondary | ICD-10-CM | POA: Diagnosis not present

## 2023-07-01 DIAGNOSIS — I2510 Atherosclerotic heart disease of native coronary artery without angina pectoris: Secondary | ICD-10-CM | POA: Diagnosis not present

## 2023-07-01 DIAGNOSIS — M5136 Other intervertebral disc degeneration, lumbar region with discogenic back pain only: Secondary | ICD-10-CM | POA: Diagnosis not present

## 2023-07-02 DIAGNOSIS — M4317 Spondylolisthesis, lumbosacral region: Secondary | ICD-10-CM | POA: Diagnosis not present

## 2023-07-26 DIAGNOSIS — D500 Iron deficiency anemia secondary to blood loss (chronic): Secondary | ICD-10-CM | POA: Diagnosis not present

## 2023-07-26 DIAGNOSIS — I2510 Atherosclerotic heart disease of native coronary artery without angina pectoris: Secondary | ICD-10-CM | POA: Diagnosis not present

## 2023-08-02 ENCOUNTER — Telehealth: Payer: Self-pay | Admitting: Cardiovascular Disease

## 2023-08-02 DIAGNOSIS — R7989 Other specified abnormal findings of blood chemistry: Secondary | ICD-10-CM | POA: Diagnosis not present

## 2023-08-02 DIAGNOSIS — E538 Deficiency of other specified B group vitamins: Secondary | ICD-10-CM | POA: Diagnosis not present

## 2023-08-02 DIAGNOSIS — R739 Hyperglycemia, unspecified: Secondary | ICD-10-CM | POA: Diagnosis not present

## 2023-08-02 DIAGNOSIS — M5136 Other intervertebral disc degeneration, lumbar region with discogenic back pain only: Secondary | ICD-10-CM | POA: Diagnosis not present

## 2023-08-02 DIAGNOSIS — E782 Mixed hyperlipidemia: Secondary | ICD-10-CM | POA: Diagnosis not present

## 2023-08-02 NOTE — Telephone Encounter (Signed)
Left voicemail message to call back  

## 2023-08-02 NOTE — Telephone Encounter (Signed)
   Pt c/o of Chest Pain: STAT if active CP, including tightness, pressure, jaw pain, radiating pain to shoulder/upper arm/back, CP unrelieved by Nitro. Symptoms reported of SOB, nausea, vomiting, sweating.  1. Are you having CP right now? No, had one this morning   2. Are you experiencing any other symptoms (ex. SOB, nausea, vomiting, sweating)? SOB and nausea   3. Is your CP continuous or coming and going? Coming and going   4. Have you taken Nitroglycerin? No   5. How long have you been experiencing CP?  Several weeks ago it had started    6. If NO CP at time of call then end call with telling Pt to call back or call 911 if Chest pain returns prior to return call from triage team.

## 2023-08-06 NOTE — Telephone Encounter (Signed)
Left voicemail message to call back

## 2023-08-08 ENCOUNTER — Emergency Department: Payer: PPO

## 2023-08-08 ENCOUNTER — Other Ambulatory Visit: Payer: Self-pay

## 2023-08-08 ENCOUNTER — Inpatient Hospital Stay
Admission: EM | Admit: 2023-08-08 | Discharge: 2023-08-30 | DRG: 862 | Disposition: A | Discharge status: Skilled Nursing Facility | Payer: PPO | Attending: Obstetrics and Gynecology | Admitting: Obstetrics and Gynecology

## 2023-08-08 DIAGNOSIS — M4646 Discitis, unspecified, lumbar region: Secondary | ICD-10-CM | POA: Diagnosis not present

## 2023-08-08 DIAGNOSIS — G500 Trigeminal neuralgia: Secondary | ICD-10-CM | POA: Diagnosis not present

## 2023-08-08 DIAGNOSIS — Z881 Allergy status to other antibiotic agents status: Secondary | ICD-10-CM

## 2023-08-08 DIAGNOSIS — R262 Difficulty in walking, not elsewhere classified: Secondary | ICD-10-CM | POA: Diagnosis not present

## 2023-08-08 DIAGNOSIS — T8142XA Infection following a procedure, deep incisional surgical site, initial encounter: Secondary | ICD-10-CM | POA: Diagnosis not present

## 2023-08-08 DIAGNOSIS — E538 Deficiency of other specified B group vitamins: Secondary | ICD-10-CM | POA: Diagnosis not present

## 2023-08-08 DIAGNOSIS — R918 Other nonspecific abnormal finding of lung field: Secondary | ICD-10-CM | POA: Diagnosis not present

## 2023-08-08 DIAGNOSIS — M4317 Spondylolisthesis, lumbosacral region: Secondary | ICD-10-CM | POA: Diagnosis present

## 2023-08-08 DIAGNOSIS — Z9049 Acquired absence of other specified parts of digestive tract: Secondary | ICD-10-CM

## 2023-08-08 DIAGNOSIS — M48061 Spinal stenosis, lumbar region without neurogenic claudication: Secondary | ICD-10-CM | POA: Diagnosis not present

## 2023-08-08 DIAGNOSIS — R110 Nausea: Secondary | ICD-10-CM | POA: Diagnosis not present

## 2023-08-08 DIAGNOSIS — E871 Hypo-osmolality and hyponatremia: Secondary | ICD-10-CM | POA: Diagnosis present

## 2023-08-08 DIAGNOSIS — R6520 Severe sepsis without septic shock: Secondary | ICD-10-CM | POA: Diagnosis present

## 2023-08-08 DIAGNOSIS — A4102 Sepsis due to Methicillin resistant Staphylococcus aureus: Secondary | ICD-10-CM | POA: Diagnosis present

## 2023-08-08 DIAGNOSIS — Y848 Other medical procedures as the cause of abnormal reaction of the patient, or of later complication, without mention of misadventure at the time of the procedure: Secondary | ICD-10-CM | POA: Diagnosis present

## 2023-08-08 DIAGNOSIS — R339 Retention of urine, unspecified: Secondary | ICD-10-CM | POA: Diagnosis not present

## 2023-08-08 DIAGNOSIS — Z883 Allergy status to other anti-infective agents status: Secondary | ICD-10-CM

## 2023-08-08 DIAGNOSIS — Z91013 Allergy to seafood: Secondary | ICD-10-CM

## 2023-08-08 DIAGNOSIS — Z1152 Encounter for screening for COVID-19: Secondary | ICD-10-CM

## 2023-08-08 DIAGNOSIS — R000 Tachycardia, unspecified: Secondary | ICD-10-CM | POA: Diagnosis not present

## 2023-08-08 DIAGNOSIS — R9389 Abnormal findings on diagnostic imaging of other specified body structures: Secondary | ICD-10-CM | POA: Diagnosis not present

## 2023-08-08 DIAGNOSIS — K5903 Drug induced constipation: Secondary | ICD-10-CM | POA: Diagnosis not present

## 2023-08-08 DIAGNOSIS — Z803 Family history of malignant neoplasm of breast: Secondary | ICD-10-CM

## 2023-08-08 DIAGNOSIS — R338 Other retention of urine: Secondary | ICD-10-CM | POA: Diagnosis not present

## 2023-08-08 DIAGNOSIS — M1612 Unilateral primary osteoarthritis, left hip: Secondary | ICD-10-CM | POA: Diagnosis present

## 2023-08-08 DIAGNOSIS — Z96653 Presence of artificial knee joint, bilateral: Secondary | ICD-10-CM | POA: Diagnosis present

## 2023-08-08 DIAGNOSIS — R0602 Shortness of breath: Secondary | ICD-10-CM | POA: Diagnosis not present

## 2023-08-08 DIAGNOSIS — N200 Calculus of kidney: Secondary | ICD-10-CM | POA: Diagnosis not present

## 2023-08-08 DIAGNOSIS — J309 Allergic rhinitis, unspecified: Secondary | ICD-10-CM | POA: Diagnosis not present

## 2023-08-08 DIAGNOSIS — M4626 Osteomyelitis of vertebra, lumbar region: Secondary | ICD-10-CM | POA: Diagnosis not present

## 2023-08-08 DIAGNOSIS — Z8249 Family history of ischemic heart disease and other diseases of the circulatory system: Secondary | ICD-10-CM

## 2023-08-08 DIAGNOSIS — Z79890 Hormone replacement therapy (postmenopausal): Secondary | ICD-10-CM | POA: Diagnosis not present

## 2023-08-08 DIAGNOSIS — R7881 Bacteremia: Secondary | ICD-10-CM | POA: Diagnosis not present

## 2023-08-08 DIAGNOSIS — T8144XA Sepsis following a procedure, initial encounter: Secondary | ICD-10-CM | POA: Diagnosis present

## 2023-08-08 DIAGNOSIS — R52 Pain, unspecified: Secondary | ICD-10-CM | POA: Diagnosis not present

## 2023-08-08 DIAGNOSIS — I38 Endocarditis, valve unspecified: Secondary | ICD-10-CM

## 2023-08-08 DIAGNOSIS — I2510 Atherosclerotic heart disease of native coronary artery without angina pectoris: Secondary | ICD-10-CM | POA: Diagnosis not present

## 2023-08-08 DIAGNOSIS — E872 Acidosis, unspecified: Secondary | ICD-10-CM | POA: Diagnosis present

## 2023-08-08 DIAGNOSIS — E669 Obesity, unspecified: Secondary | ICD-10-CM | POA: Diagnosis not present

## 2023-08-08 DIAGNOSIS — Z888 Allergy status to other drugs, medicaments and biological substances status: Secondary | ICD-10-CM

## 2023-08-08 DIAGNOSIS — M7602 Gluteal tendinitis, left hip: Secondary | ICD-10-CM | POA: Diagnosis not present

## 2023-08-08 DIAGNOSIS — K219 Gastro-esophageal reflux disease without esophagitis: Secondary | ICD-10-CM | POA: Diagnosis present

## 2023-08-08 DIAGNOSIS — G8929 Other chronic pain: Secondary | ICD-10-CM | POA: Diagnosis not present

## 2023-08-08 DIAGNOSIS — K5730 Diverticulosis of large intestine without perforation or abscess without bleeding: Secondary | ICD-10-CM | POA: Diagnosis not present

## 2023-08-08 DIAGNOSIS — M47816 Spondylosis without myelopathy or radiculopathy, lumbar region: Secondary | ICD-10-CM | POA: Diagnosis not present

## 2023-08-08 DIAGNOSIS — Z885 Allergy status to narcotic agent status: Secondary | ICD-10-CM

## 2023-08-08 DIAGNOSIS — E782 Mixed hyperlipidemia: Secondary | ICD-10-CM | POA: Diagnosis not present

## 2023-08-08 DIAGNOSIS — G9340 Encephalopathy, unspecified: Secondary | ICD-10-CM | POA: Diagnosis not present

## 2023-08-08 DIAGNOSIS — Z79899 Other long term (current) drug therapy: Secondary | ICD-10-CM | POA: Diagnosis not present

## 2023-08-08 DIAGNOSIS — Z7982 Long term (current) use of aspirin: Secondary | ICD-10-CM

## 2023-08-08 DIAGNOSIS — R41841 Cognitive communication deficit: Secondary | ICD-10-CM | POA: Diagnosis not present

## 2023-08-08 DIAGNOSIS — R4182 Altered mental status, unspecified: Secondary | ICD-10-CM | POA: Diagnosis not present

## 2023-08-08 DIAGNOSIS — R531 Weakness: Secondary | ICD-10-CM | POA: Diagnosis not present

## 2023-08-08 DIAGNOSIS — R6510 Systemic inflammatory response syndrome (SIRS) of non-infectious origin without acute organ dysfunction: Secondary | ICD-10-CM | POA: Diagnosis not present

## 2023-08-08 DIAGNOSIS — M419 Scoliosis, unspecified: Secondary | ICD-10-CM | POA: Diagnosis not present

## 2023-08-08 DIAGNOSIS — M25552 Pain in left hip: Secondary | ICD-10-CM | POA: Diagnosis not present

## 2023-08-08 DIAGNOSIS — A4101 Sepsis due to Methicillin susceptible Staphylococcus aureus: Secondary | ICD-10-CM | POA: Diagnosis not present

## 2023-08-08 DIAGNOSIS — I6782 Cerebral ischemia: Secondary | ICD-10-CM | POA: Diagnosis not present

## 2023-08-08 DIAGNOSIS — Z87440 Personal history of urinary (tract) infections: Secondary | ICD-10-CM

## 2023-08-08 DIAGNOSIS — T402X5A Adverse effect of other opioids, initial encounter: Secondary | ICD-10-CM | POA: Diagnosis not present

## 2023-08-08 DIAGNOSIS — K5900 Constipation, unspecified: Secondary | ICD-10-CM | POA: Diagnosis not present

## 2023-08-08 DIAGNOSIS — A419 Sepsis, unspecified organism: Principal | ICD-10-CM | POA: Diagnosis present

## 2023-08-08 DIAGNOSIS — I700 Atherosclerosis of aorta: Secondary | ICD-10-CM | POA: Diagnosis not present

## 2023-08-08 DIAGNOSIS — E785 Hyperlipidemia, unspecified: Secondary | ICD-10-CM | POA: Diagnosis not present

## 2023-08-08 DIAGNOSIS — I340 Nonrheumatic mitral (valve) insufficiency: Secondary | ICD-10-CM | POA: Diagnosis not present

## 2023-08-08 DIAGNOSIS — G4700 Insomnia, unspecified: Secondary | ICD-10-CM | POA: Diagnosis not present

## 2023-08-08 DIAGNOSIS — G253 Myoclonus: Secondary | ICD-10-CM | POA: Diagnosis not present

## 2023-08-08 DIAGNOSIS — M5126 Other intervertebral disc displacement, lumbar region: Secondary | ICD-10-CM | POA: Diagnosis not present

## 2023-08-08 DIAGNOSIS — G928 Other toxic encephalopathy: Secondary | ICD-10-CM | POA: Diagnosis not present

## 2023-08-08 DIAGNOSIS — G4710 Hypersomnia, unspecified: Secondary | ICD-10-CM | POA: Diagnosis not present

## 2023-08-08 DIAGNOSIS — R2681 Unsteadiness on feet: Secondary | ICD-10-CM | POA: Diagnosis not present

## 2023-08-08 DIAGNOSIS — D75839 Thrombocytosis, unspecified: Secondary | ICD-10-CM | POA: Diagnosis present

## 2023-08-08 DIAGNOSIS — M545 Low back pain, unspecified: Secondary | ICD-10-CM | POA: Diagnosis not present

## 2023-08-08 DIAGNOSIS — G9341 Metabolic encephalopathy: Principal | ICD-10-CM | POA: Diagnosis present

## 2023-08-08 DIAGNOSIS — R509 Fever, unspecified: Secondary | ICD-10-CM | POA: Diagnosis not present

## 2023-08-08 DIAGNOSIS — Z981 Arthrodesis status: Secondary | ICD-10-CM

## 2023-08-08 DIAGNOSIS — R410 Disorientation, unspecified: Secondary | ICD-10-CM | POA: Diagnosis not present

## 2023-08-08 DIAGNOSIS — M62838 Other muscle spasm: Secondary | ICD-10-CM | POA: Diagnosis not present

## 2023-08-08 DIAGNOSIS — N3289 Other specified disorders of bladder: Secondary | ICD-10-CM | POA: Diagnosis present

## 2023-08-08 DIAGNOSIS — R1311 Dysphagia, oral phase: Secondary | ICD-10-CM | POA: Diagnosis not present

## 2023-08-08 DIAGNOSIS — I739 Peripheral vascular disease, unspecified: Secondary | ICD-10-CM | POA: Diagnosis not present

## 2023-08-08 DIAGNOSIS — M4807 Spinal stenosis, lumbosacral region: Secondary | ICD-10-CM | POA: Diagnosis not present

## 2023-08-08 DIAGNOSIS — Z0389 Encounter for observation for other suspected diseases and conditions ruled out: Secondary | ICD-10-CM | POA: Diagnosis not present

## 2023-08-08 DIAGNOSIS — K429 Umbilical hernia without obstruction or gangrene: Secondary | ICD-10-CM | POA: Diagnosis not present

## 2023-08-08 DIAGNOSIS — Z7401 Bed confinement status: Secondary | ICD-10-CM | POA: Diagnosis not present

## 2023-08-08 DIAGNOSIS — B9562 Methicillin resistant Staphylococcus aureus infection as the cause of diseases classified elsewhere: Secondary | ICD-10-CM | POA: Diagnosis not present

## 2023-08-08 DIAGNOSIS — Z90710 Acquired absence of both cervix and uterus: Secondary | ICD-10-CM

## 2023-08-08 DIAGNOSIS — K449 Diaphragmatic hernia without obstruction or gangrene: Secondary | ICD-10-CM | POA: Diagnosis not present

## 2023-08-08 DIAGNOSIS — M160 Bilateral primary osteoarthritis of hip: Secondary | ICD-10-CM | POA: Diagnosis not present

## 2023-08-08 DIAGNOSIS — R0689 Other abnormalities of breathing: Secondary | ICD-10-CM | POA: Diagnosis not present

## 2023-08-08 DIAGNOSIS — Z515 Encounter for palliative care: Secondary | ICD-10-CM | POA: Diagnosis not present

## 2023-08-08 DIAGNOSIS — R269 Unspecified abnormalities of gait and mobility: Secondary | ICD-10-CM | POA: Diagnosis not present

## 2023-08-08 DIAGNOSIS — M4856XA Collapsed vertebra, not elsewhere classified, lumbar region, initial encounter for fracture: Secondary | ICD-10-CM | POA: Diagnosis not present

## 2023-08-08 DIAGNOSIS — D519 Vitamin B12 deficiency anemia, unspecified: Secondary | ICD-10-CM | POA: Diagnosis not present

## 2023-08-08 DIAGNOSIS — R600 Localized edema: Secondary | ICD-10-CM | POA: Diagnosis not present

## 2023-08-08 DIAGNOSIS — M6281 Muscle weakness (generalized): Secondary | ICD-10-CM | POA: Diagnosis not present

## 2023-08-08 DIAGNOSIS — M5105 Intervertebral disc disorders with myelopathy, thoracolumbar region: Secondary | ICD-10-CM | POA: Diagnosis not present

## 2023-08-08 DIAGNOSIS — E876 Hypokalemia: Secondary | ICD-10-CM | POA: Diagnosis present

## 2023-08-08 DIAGNOSIS — I361 Nonrheumatic tricuspid (valve) insufficiency: Secondary | ICD-10-CM | POA: Diagnosis not present

## 2023-08-08 DIAGNOSIS — I77819 Aortic ectasia, unspecified site: Secondary | ICD-10-CM | POA: Diagnosis not present

## 2023-08-08 LAB — URINALYSIS, COMPLETE (UACMP) WITH MICROSCOPIC
Bilirubin Urine: NEGATIVE
Glucose, UA: NEGATIVE mg/dL
Hgb urine dipstick: NEGATIVE
Ketones, ur: 20 mg/dL — AB
Leukocytes,Ua: NEGATIVE
Nitrite: NEGATIVE
Protein, ur: NEGATIVE mg/dL
Specific Gravity, Urine: 1.016 (ref 1.005–1.030)
WBC, UA: 0 WBC/hpf (ref 0–5)
pH: 8 (ref 5.0–8.0)

## 2023-08-08 LAB — CBC WITH DIFFERENTIAL/PLATELET
Abs Immature Granulocytes: 0.05 10*3/uL (ref 0.00–0.07)
Basophils Absolute: 0 10*3/uL (ref 0.0–0.1)
Basophils Relative: 0 %
Eosinophils Absolute: 0 10*3/uL (ref 0.0–0.5)
Eosinophils Relative: 0 %
HCT: 41.4 % (ref 36.0–46.0)
Hemoglobin: 13.3 g/dL (ref 12.0–15.0)
Immature Granulocytes: 0 %
Lymphocytes Relative: 6 %
Lymphs Abs: 0.8 10*3/uL (ref 0.7–4.0)
MCH: 31.5 pg (ref 26.0–34.0)
MCHC: 32.1 g/dL (ref 30.0–36.0)
MCV: 98.1 fL (ref 80.0–100.0)
Monocytes Absolute: 0.8 10*3/uL (ref 0.1–1.0)
Monocytes Relative: 6 %
Neutro Abs: 11 10*3/uL — ABNORMAL HIGH (ref 1.7–7.7)
Neutrophils Relative %: 88 %
Platelets: 243 10*3/uL (ref 150–400)
RBC: 4.22 MIL/uL (ref 3.87–5.11)
RDW: 14.3 % (ref 11.5–15.5)
WBC: 12.6 10*3/uL — ABNORMAL HIGH (ref 4.0–10.5)
nRBC: 0 % (ref 0.0–0.2)

## 2023-08-08 LAB — LACTIC ACID, PLASMA: Lactic Acid, Venous: 1 mmol/L (ref 0.5–1.9)

## 2023-08-08 LAB — PROCALCITONIN: Procalcitonin: <0.1 ng/mL

## 2023-08-08 LAB — COMPREHENSIVE METABOLIC PANEL
ALT: 18 U/L (ref 0–44)
AST: 40 U/L (ref 15–41)
Albumin: 4.4 g/dL (ref 3.5–5.0)
Alkaline Phosphatase: 73 U/L (ref 38–126)
Anion gap: 10 (ref 5–15)
BUN: 19 mg/dL (ref 8–23)
CO2: 18 mmol/L — ABNORMAL LOW (ref 22–32)
Calcium: 9.2 mg/dL (ref 8.9–10.3)
Chloride: 105 mmol/L (ref 98–111)
Creatinine, Ser: 0.75 mg/dL (ref 0.44–1.00)
GFR, Estimated: >60 mL/min (ref >60)
Glucose, Bld: 106 mg/dL — ABNORMAL HIGH (ref 70–99)
Potassium: 3.9 mmol/L (ref 3.5–5.1)
Sodium: 133 mmol/L — ABNORMAL LOW (ref 135–145)
Total Bilirubin: 0.8 mg/dL (ref <1.2)
Total Protein: 8 g/dL (ref 6.5–8.1)

## 2023-08-08 LAB — URINALYSIS, W/ REFLEX TO CULTURE (INFECTION SUSPECTED)
Bilirubin Urine: NEGATIVE
Glucose, UA: NEGATIVE mg/dL
Hgb urine dipstick: NEGATIVE
Ketones, ur: 5 mg/dL — AB
Leukocytes,Ua: NEGATIVE
Nitrite: NEGATIVE
Protein, ur: NEGATIVE mg/dL
Specific Gravity, Urine: 1.013 (ref 1.005–1.030)
pH: 8 (ref 5.0–8.0)

## 2023-08-08 LAB — CBG MONITORING, ED: Glucose-Capillary: 94 mg/dL (ref 70–99)

## 2023-08-08 LAB — RESP PANEL BY RT-PCR (RSV, FLU A&B, COVID)  RVPGX2
Influenza A by PCR: NEGATIVE
Influenza B by PCR: NEGATIVE
Resp Syncytial Virus by PCR: NEGATIVE
SARS Coronavirus 2 by RT PCR: NEGATIVE

## 2023-08-08 LAB — PROTIME-INR
INR: 1 (ref 0.8–1.2)
Prothrombin Time: 13.5 s (ref 11.4–15.2)

## 2023-08-08 LAB — APTT: aPTT: 27 s (ref 24–36)

## 2023-08-08 MED ORDER — HYDROCODONE-ACETAMINOPHEN 5-325 MG PO TABS
1.0000 | ORAL_TABLET | Freq: Three times a day (TID) | ORAL | Status: DC | PRN
  Administered 2023-08-08 – 2023-08-09 (×3): 1 via ORAL
  Filled 2023-08-08 (×3): qty 1

## 2023-08-08 MED ORDER — SODIUM CHLORIDE 0.9 % IV SOLN
2.0000 g | INTRAVENOUS | Status: DC
  Administered 2023-08-08: 2 g via INTRAVENOUS
  Filled 2023-08-08: qty 20

## 2023-08-08 MED ORDER — GABAPENTIN 300 MG PO CAPS
300.0000 mg | ORAL_CAPSULE | Freq: Every day | ORAL | Status: DC
  Administered 2023-08-08 – 2023-08-15 (×8): 300 mg via ORAL
  Filled 2023-08-08 (×8): qty 1

## 2023-08-08 MED ORDER — ONDANSETRON HCL 4 MG PO TABS: 4.0000 mg | ORAL_TABLET | Freq: Four times a day (QID) | ORAL | Status: DC | PRN

## 2023-08-08 MED ORDER — SODIUM CHLORIDE 0.9% FLUSH
3.0000 mL | Freq: Two times a day (BID) | INTRAVENOUS | Status: DC
  Administered 2023-08-08 – 2023-08-26 (×33): 3 mL via INTRAVENOUS

## 2023-08-08 MED ORDER — ACETAMINOPHEN 325 MG PO TABS
650.0000 mg | ORAL_TABLET | Freq: Four times a day (QID) | ORAL | Status: DC | PRN
Start: 2023-08-08 — End: 2023-08-14
  Administered 2023-08-09: 650 mg via ORAL
  Administered 2023-08-10: 325 mg via ORAL
  Administered 2023-08-11 – 2023-08-13 (×4): 650 mg via ORAL
  Filled 2023-08-08 (×7): qty 2

## 2023-08-08 MED ORDER — SODIUM CHLORIDE 0.9 % IV BOLUS
500.0000 mL | Freq: Once | INTRAVENOUS | Status: AC
  Administered 2023-08-08: 500 mL via INTRAVENOUS

## 2023-08-08 MED ORDER — ACETAMINOPHEN 325 MG PO TABS
650.0000 mg | ORAL_TABLET | Freq: Once | ORAL | Status: AC
  Administered 2023-08-08: 650 mg via ORAL
  Filled 2023-08-08: qty 2

## 2023-08-08 MED ORDER — TOPIRAMATE 25 MG PO TABS: 150.0000 mg | ORAL_TABLET | Freq: Every day | ORAL | Status: DC

## 2023-08-08 MED ORDER — IOHEXOL 300 MG/ML  SOLN
75.0000 mL | Freq: Once | INTRAMUSCULAR | Status: AC | PRN
  Administered 2023-08-08: 75 mL via INTRAVENOUS

## 2023-08-08 MED ORDER — LORATADINE 10 MG PO TABS
10.0000 mg | ORAL_TABLET | Freq: Every day | ORAL | Status: DC
  Administered 2023-08-08 – 2023-08-30 (×23): 10 mg via ORAL
  Filled 2023-08-08 (×23): qty 1

## 2023-08-08 MED ORDER — POLYETHYLENE GLYCOL 3350 17 G PO PACK: 17.0000 g | PACK | Freq: Every day | ORAL | Status: DC | PRN

## 2023-08-08 MED ORDER — ACETAMINOPHEN 650 MG RE SUPP
650.0000 mg | Freq: Four times a day (QID) | RECTAL | Status: DC | PRN
Start: 2023-08-08 — End: 2023-08-14
  Administered 2023-08-09 – 2023-08-12 (×2): 650 mg via RECTAL
  Filled 2023-08-08 (×2): qty 1

## 2023-08-08 MED ORDER — ONDANSETRON HCL 4 MG/2ML IJ SOLN: 4.0000 mg | Freq: Four times a day (QID) | INTRAMUSCULAR | Status: DC | PRN

## 2023-08-08 MED ORDER — ASPIRIN 81 MG PO TBEC
81.0000 mg | DELAYED_RELEASE_TABLET | Freq: Every day | ORAL | Status: DC
  Administered 2023-08-09 – 2023-08-30 (×22): 81 mg via ORAL
  Filled 2023-08-08 (×22): qty 1

## 2023-08-08 MED ORDER — TOPIRAMATE 25 MG PO TABS
150.0000 mg | ORAL_TABLET | Freq: Every day | ORAL | Status: DC
  Administered 2023-08-08 – 2023-08-09 (×2): 150 mg via ORAL
  Filled 2023-08-08: qty 6
  Filled 2023-08-08 (×2): qty 2

## 2023-08-08 MED ORDER — METOPROLOL SUCCINATE ER 25 MG PO TB24
25.0000 mg | ORAL_TABLET | Freq: Every day | ORAL | Status: DC
  Administered 2023-08-09 – 2023-08-27 (×16): 25 mg via ORAL
  Filled 2023-08-08 (×18): qty 1

## 2023-08-08 MED ORDER — ENOXAPARIN SODIUM 40 MG/0.4ML IJ SOSY
40.0000 mg | PREFILLED_SYRINGE | INTRAMUSCULAR | Status: DC
  Administered 2023-08-08 – 2023-08-29 (×22): 40 mg via SUBCUTANEOUS
  Filled 2023-08-08 (×22): qty 0.4

## 2023-08-08 NOTE — Assessment & Plan Note (Signed)
In light of altered mental status, and a history of chronic vitamin B12 deficiency, will repeat vitamin B12 in the a.m.  - Vitamin B12 pending

## 2023-08-08 NOTE — Assessment & Plan Note (Signed)
Significant bladder distention noted on CT imaging with patient's inability to urinate afterwards.  In-N-Out catheter with 600 cc.  Potentially due to febrile illness.  - Frequent bladder scans with In-N-Out as needed

## 2023-08-08 NOTE — H&P (Signed)
History and Physical    Patient: Jocelyn Hooper DOB: [birth year] DOA: 08/08/2023 DOS: the patient was seen and examined on 08/08/2023 PCP: Danella Penton, MD  Patient coming from: Home  Chief Complaint:  Chief Complaint  Patient presents with   Recurrent UTI   Altered Mental Status   HPI: Jocelyn Hooper is a 82 y.o. female with medical history significant of iron deficiency anemia, B12 deficiency, arteriosclerotic disease, nephrolithiasis, scoliosis s/p recent intervention (06/09/2023), who presents to the ED due to altered mental status.  History obtained from both patient and her family at bedside.  Jocelyn Hooper states that she experiencing worsening of her left hip pain and has been told in the past she has bursitis.  Her husband at bedside states that yesterday, she climbed the stairs numerous times in preparation for the holidays.  Jocelyn Hooper also endorses bladder fullness and feels as though she is unable to urinate.  She denies any dysuria, frequency, urgency or abdominal pain though.  She denies any cough, shortness of breath, chest pain, palpitations, lower extremity swelling.  Jocelyn Hooper states that yesterday, she was in her usual state of health but upon awakening today, he noted that she was more confused and had a fever.  He notes that she has frequent UTIs.  [name] at bedside states that patient has had occasional memory issues and repeats herself multiple times but it seems much worse today.  ED course: On arrival to the ED, patient was hypertensive at 139/85 with heart rate of 117.  She was afebrile at 99.4 but subsequently developed a fever of 101.5.  She was saturating at 99% on room air.  Initial workup notable for WBC of 12.6, sodium of 133, bicarb of 18, creatinine 0.75 with GFR above 60.  Lactic acid within normal limits.  COVID-19 negative.  Urinalysis with ketonuria and rare bacteria.  CT of the abdomen was obtained and results pending.  Chest x-ray was  obtained with no active disease.  Patient was started on IV fluids and Rocephin.  TRH contacted for admission.  Review of Systems: As mentioned in the history of present illness. All other systems reviewed and are negative.  Past Medical History:  Diagnosis Date   Bursitis of left shoulder    Chest pain, unspecified    Complication of anesthesia    PT HAS SEVERE SCOLIOSIS AND FOR TKR ANESTHESIA COULD NOT GET A SPINAL   Coronary artery disease    ascvd   CSF leak    DDD (degenerative disc disease), cervical    Diverticulosis    Esophageal reflux    Fibrocystic breast disease    GERD (gastroesophageal reflux disease)    Headache    H/O   History of colon polyps    History of hiatal hernia    History of kidney stones    IBS (irritable bowel syndrome)    Nasal polyps    Other and unspecified hyperlipidemia    Scoliosis    Tinnitus of both ears    Trigeminal neuralgia syndrome    Unspecified sinusitis (chronic)    Vitamin B12 deficiency    Past Surgical History:  Procedure Laterality Date   ABDOMINAL HYSTERECTOMY     APPENDECTOMY     BACK SURGERY  1982   disc   CARDIAC CATHETERIZATION     [facility]    CHOLECYSTECTOMY  2002   COLONOSCOPY WITH PROPOFOL N/A 03/10/2017   Procedure: COLONOSCOPY WITH PROPOFOL;  Surgeon: Scot Jun,  MD;  Location: [facility] ENDOSCOPY;  Service: Endoscopy;  Laterality: N/A;   COLONOSCOPY WITH PROPOFOL N/A 01/13/2021   Procedure: COLONOSCOPY WITH PROPOFOL;  Surgeon: Toledo, Boykin Nearing, MD;  Location: [facility] ENDOSCOPY;  Service: Gastroenterology;  Laterality: N/A;   ESOPHAGOGASTRODUODENOSCOPY (EGD) WITH PROPOFOL N/A 01/13/2021   Procedure: ESOPHAGOGASTRODUODENOSCOPY (EGD) WITH PROPOFOL;  Surgeon: Toledo, Boykin Nearing, MD;  Location: [facility] ENDOSCOPY;  Service: Gastroenterology;  Laterality: N/A;   EXTRACORPOREAL SHOCK WAVE LITHOTRIPSY Right 01/09/2021   Procedure: EXTRACORPOREAL SHOCK WAVE LITHOTRIPSY (ESWL);  Surgeon: Vanna Scotland, MD;  Location:  [facility] ORS;  Service: Urology;  Laterality: Right;   EYE SURGERY Bilateral    Cataract Extraction with IOL   hysterectomy (other)  1974   JOINT REPLACEMENT     LEFT knee   KNEE ARTHROPLASTY Right 09/27/2017   Procedure: COMPUTER ASSISTED TOTAL KNEE ARTHROPLASTY;  Surgeon: Donato Heinz, MD;  Location: [facility] ORS;  Service: Orthopedics;  Laterality: Right;   KNEE ARTHROSCOPY Right 09/13/2015   Procedure: ARTHROSCOPY KNEE, PARTIAL LATERAL MENISECTOMY, CHONDROPLASTY ;  Surgeon: Donato Heinz, MD;  Location: [facility] ORS;  Service: Orthopedics;  Laterality: Right;   KNEE SURGERY  2004   having a lot of problems with left knee   NASAL SINUS SURGERY  1967, 1988, 1993, 2003, 2012   sinus procedures  1970,1990,1994,2005   TONSILLECTOMY  2015   TRANSFORAMINAL LUMBAR INTERBODY FUSION (TLIF) WITH PEDICLE SCREW FIXATION 1 LEVEL N/A 06/09/2023   Procedure: TRANSFOAMINAL LUMBAR INTERBODY FUSION,INTERBODY PROSTHESIS,POSTERIOR INSTRUMENTATION LUMBAR FIVE-SACRAL ONE;  Surgeon: Tressie Stalker, MD;  Location: [facility] OR;  Service: Neurosurgery;  Laterality: N/A;   Social History:  reports that she has never smoked. She has never used smokeless tobacco. She reports that she does not drink alcohol and does not use drugs.  Allergies  Allergen Reactions   Avelox [Moxifloxacin Hcl In Nacl] Other (See Comments)    Redness all over in about 5 minutes after taking   Codeine Nausea Only   Acyclovir And Related     Turn red all over    Shrimp [Shellfish Allergy]     Hives     Ciprofloxacin Rash   Moxifloxacin Hives and Rash   Sucralfate Rash    Itching     Family History  Problem Relation Age of Onset   Heart attack Mother 49   Heart failure Mother    Heart attack Father    Heart disease Father        CABG   Heart attack Brother 22       premature   Breast cancer Sister 36    Prior to Admission medications   Medication Sig Start Date End Date Taking? Authorizing Provider  aspirin EC 81 MG tablet Take 81  mg by mouth daily.    [provider]  Calcium Carbonate-Vitamin D 600-400 MG-UNIT tablet Take 1 tablet by mouth 2 (two) times daily.    [provider]  cetirizine (ZYRTEC) 10 MG tablet Take 10 mg by mouth daily.    [provider]  cyclobenzaprine (FLEXERIL) 5 MG tablet Take 1 tablet (5 mg total) by mouth 3 (three) times daily as needed for muscle spasms. 06/10/23   Tressie Stalker, MD  docusate sodium (COLACE) 100 MG capsule Take 1 capsule (100 mg total) by mouth 2 (two) times daily. 06/10/23   Tressie Stalker, MD  estradiol (ESTRACE) 0.5 MG tablet Take 0.5 mg by mouth daily.    [provider]  fluticasone (FLONASE) 50 MCG/ACT nasal spray Place 1 spray  into both nostrils in the morning and at bedtime. 01/09/14   [provider]  gabapentin (NEURONTIN) 300 MG capsule Take 300 mg by mouth at bedtime.     [provider]  lansoprazole (PREVACID) 30 MG capsule Take 30 mg by mouth daily. 10/06/21   [provider]  Melatonin 10 MG TABS Take 10 mg by mouth at bedtime.    [provider]  metoCLOPramide (REGLAN) 5 MG tablet Take 5 mg by mouth every 8 (eight) hours as needed for vomiting or nausea. 11/13/20   [provider]  oxybutynin (DITROPAN) 5 MG tablet Take 1 tablet (5 mg total) by mouth daily. Patient not taking: Reported on 05/20/2023 04/27/23   Michiel Cowboy A, PA-C  oxyCODONE-acetaminophen (PERCOCET/ROXICET) 5-325 MG tablet Take 1 tablet by mouth every 4 (four) hours as needed for moderate pain. 06/10/23   Tressie Stalker, MD  topiramate (TOPAMAX) 50 MG tablet Take 150 mg by mouth daily. 07/26/19   [provider]  vitamin C (ASCORBIC ACID) 500 MG tablet Take 1,000 mg by mouth 2 (two) times daily.    [provider]    Physical Exam: Vitals:   08/08/23 1430 08/08/23 1500 08/08/23 1530 08/08/23 1600  BP: (!) 116/58 115/61 (!) 107/58 (!) 117/59  Pulse: 93 100 92 95  Resp: 18 (!) 29 19 (!) 22   Temp:    98 F (36.7 C)  TempSrc:    Oral  SpO2: 98% 100% 99% 98%  Weight:      Height:       Physical Exam Vitals and nursing note reviewed.  Constitutional:      Appearance: She is diaphoretic. She is not ill-appearing.  HENT:     Head: Normocephalic and atraumatic.  Eyes:     Conjunctiva/sclera: Conjunctivae normal.     Pupils: Pupils are equal, round, and reactive to light.  Cardiovascular:     Rate and Rhythm: Normal rate and regular rhythm.     Heart sounds: No murmur heard.    No gallop.  Pulmonary:     Effort: Pulmonary effort is normal. No respiratory distress.     Breath sounds: Normal breath sounds. No wheezing, rhonchi or rales.  Abdominal:     General: Bowel sounds are normal. There is no distension.     Palpations: Abdomen is soft.     Tenderness: There is no abdominal tenderness. There is no guarding.  Musculoskeletal:     Right lower leg: No edema.     Left lower leg: No edema.       Legs:  Skin:    Comments: 3 cm inch incision on lower spine with overlying scab. No surrounding erythema, TTP or discharge.   Neurological:     Mental Status: She is alert.     Comments:  Patient is alert and oriented, seems to be answering questions appropriately  Psychiatric:        Mood and Affect: Mood normal.    Data Reviewed: CBC with WBC of 12.6, hemoglobin of 13.3, platelets of 243 CMP with sodium of 133, potassium 3.9, bicarb 18, glucose 106, BUN 19, creatinine 0.75, AST 40, ALT 18 and GFR above 60 Lactic acid 1.0 INR 1.0 COVID-19, RSV and influenza PCR negative  Initial urinalysis with ketonuria and rare bacteria, although increase squamous epithelial cells.  Repeat UA utilizing in and out catheter, with ketonuria and rare bacteria.  No leukocytes, nitrites or WBC.  EKG personally reviewed.  Sinus rhythm with rate of 115.  No ST or T wave changes concerning for acute ischemia.  CT ABDOMEN PELVIS W CONTRAST  Result Date: 08/08/2023 CLINICAL DATA:  Sepsis.   Fever.  Foul-smelling urine. EXAM: CT ABDOMEN AND PELVIS WITH CONTRAST TECHNIQUE: Multidetector CT imaging of the abdomen and pelvis was performed using the standard protocol following bolus administration of intravenous contrast. RADIATION DOSE REDUCTION: This exam was performed according to the departmental dose-optimization program which includes automated exposure control, adjustment of the mA and/or kV according to patient size and/or use of iterative reconstruction technique. CONTRAST:  75mL OMNIPAQUE IOHEXOL 300 MG/ML  SOLN COMPARISON:  CT scan renal stone protocol from 02/11/2022. FINDINGS: Lower chest: There are dependent changes in the visualized lung bases. No overt consolidation. No pleural effusion. The heart is normal in size. No pericardial effusion. Hepatobiliary: The liver is normal in size. Non-cirrhotic configuration. No suspicious mass. No intrahepatic bile duct dilation. There is mild prominence of the extrahepatic bile duct, most likely due to post cholecystectomy status. Gallbladder is surgically absent. Pancreas: Unremarkable. No pancreatic ductal dilatation or surrounding inflammatory changes. Spleen: Within normal limits. No focal lesion. Adrenals/Urinary Tract: Adrenal glands are unremarkable. No suspicious renal mass. There is a 7 mm nonobstructing calculus in the right kidney interpolar region and a 3 mm calculus in the left kidney interpolar region. No ureterolithiasis on either side. No obstructive uropathy. Urinary bladder is distended. No perivesical fat stranding. No focal mass or calculi. Stomach/Bowel: There is a small-to-moderate sliding hiatal hernia. No disproportionate dilation of the small or large bowel loops. No evidence of abnormal bowel wall thickening or inflammatory changes. The appendix was not visualized; however there is no acute inflammatory process in the right lower quadrant. There are multiple diverticula mainly in the sigmoid colon, without imaging signs of  diverticulitis. Vascular/Lymphatic: No ascites or pneumoperitoneum. No abdominal or pelvic lymphadenopathy, by size criteria. No aneurysmal dilation of the major abdominal arteries. There are mild peripheral atherosclerotic vascular calcifications of the aorta and its major branches. Reproductive: The uterus is surgically absent. No large adnexal mass. Other: There is a tiny fat containing umbilical hernia. The soft tissues and abdominal wall are otherwise unremarkable. Musculoskeletal: No suspicious osseous lesions. There are moderate - marked multilevel degenerative changes in the visualized spine. Posterior spinal fixation of L5-S1 noted with transpedicular screws and rods. There is mild compression deformity of L5 vertebral body. The tips of the bilateral L5 screws are just outside the superior endplate cortex. IMPRESSION: *No acute inflammatory process identified within the abdomen or pelvis. *Bilateral single nonobstructing renal calculi noted. No hydroureteronephrosis on either side. Urinary bladder is distended but otherwise unremarkable. *Multiple other nonacute observations, as described above. Electronically Signed   By: Jules Schick M.D.   On: 08/08/2023 16:21   CT HEAD WO CONTRAST ( )  Result Date: 08/08/2023 CLINICAL DATA:  Mental status change, unknown cause EXAM: CT HEAD WITHOUT CONTRAST TECHNIQUE: Contiguous axial images were obtained from the base of the skull through the vertex without intravenous contrast. RADIATION DOSE REDUCTION: This exam was performed according to the departmental dose-optimization program which includes automated exposure control, adjustment of the mA and/or kV according to patient size and/or use of iterative reconstruction technique. COMPARISON:  Head CT 09/02/2022 FINDINGS: Brain: No hemorrhage. No hydrocephalus. No extra-axial fluid collection. No CT evidence cortical infarct. No mass effect. No mass lesion. There is a background of mild chronic microvascular  ischemic change. Vascular: No hyperdense vessel or unexpected calcification. Skull: Normal. Negative for fracture or focal lesion. Sinuses/Orbits: No  middle ear or mastoid effusion. Postsurgical changes from prior extensiveFESS, including inferior ethmoidectomies. Bilateral lens replacement. Orbits are otherwise unremarkable. Other: None. IMPRESSION: No acute intracranial abnormality. Electronically Signed   By: Lorenza Cambridge M.D.   On: 08/08/2023 12:29   DG Chest Port 1 View  Result Date: 08/08/2023 CLINICAL DATA:  Questionable sepsis - evaluate for abnormality. History of coronary artery disease. EXAM: PORTABLE CHEST 1 VIEW COMPARISON:  CT scan chest from 09/02/2022. FINDINGS: Bilateral lung fields are clear. Elevated right hemidiaphragm noted. Bilateral costophrenic angles are clear. Normal cardio-mediastinal silhouette. No acute osseous abnormalities. The soft tissues are within normal limits. IMPRESSION: *No active disease. Electronically Signed   By: Jules Schick M.D.   On: 08/08/2023 11:20    Results are pending, will review when available.  Assessment and Plan:  * SIRS (systemic inflammatory response syndrome) (HCC) Patient is presenting with borderline altered mental status, fever, tachycardia and elevated WBC.  Primary differential at this point is a potential UTI, however UA is not particularly convincing.  CT of the abdomen with no acute pathology.  Procalcitonin is negative.  Unclear source at this time, however given history of frequent UTIs, will continue with Rocephin only.  Only other abnormality is patient's bursitis pain; if no improvement, could consider orthopedic consultation.  Although patient meets SIRS criteria, there is no evidence of life-threatening organ dysfunction to suggest sepsis (per 2016 SEPSIS-3 guidelines).  Patient is certainly at high risk though.   - Telemetry monitoring - S/p 500 cc bolus - Push oral fluid rehydration - Continue Rocephin 2 g daily -  Urine and blood cultures pending - Respiratory viral panel  Acute urinary retention Significant bladder distention noted on CT imaging with patient's inability to urinate afterwards.  In-N-Out catheter with 600 cc.  Potentially due to febrile illness.  - Frequent bladder scans with In-N-Out as needed  Altered mental status Patient's family was concerned that patient was not acting as herself this morning was frequently repeating statements.  She apparently has a history of doing this over the last year but it seems worse today.  On personal examination, she seems back to baseline.  - Aspiration precautions - Delirium precautions  B12 deficiency In light of altered mental status, and a history of chronic vitamin B12 deficiency, will repeat vitamin B12 in the a.m.  - Vitamin B12 pending  ASCVD (arteriosclerotic cardiovascular disease) - Continue home aspirin  Advance Care Planning:   Code Status: Full Code verified by patient with her family at bedside  Consults: None  Family Communication: Patient's husband and daughter updated at bedside  Severity of Illness: The appropriate patient status for this patient is INPATIENT. Inpatient status is judged to be reasonable and necessary in order to provide the required intensity of service to ensure the patient's safety. The patient's presenting symptoms, physical exam findings, and initial radiographic and laboratory data in the context of their chronic comorbidities is felt to place them at high risk for further clinical deterioration. Furthermore, it is not anticipated that the patient will be medically stable for discharge from the hospital within 2 midnights of admission.   * I certify that at the point of admission it is my clinical judgment that the patient will require inpatient hospital care spanning beyond 2 midnights from the point of admission due to high intensity of service, high risk for further deterioration and high frequency of  surveillance required.*  Author: Verdene Lennert, MD 08/08/2023 4:56 PM  For on call review [email or web address].

## 2023-08-08 NOTE — Assessment & Plan Note (Addendum)
Patient is presenting with borderline altered mental status, fever, tachycardia and elevated WBC.  Primary differential at this point is a potential UTI, however UA is not particularly convincing.  CT of the abdomen with no acute pathology.  Procalcitonin is negative.  Unclear source at this time, however given history of frequent UTIs, will continue with Rocephin only.  Only other abnormality is patient's bursitis pain; if no improvement, could consider orthopedic consultation.  Although patient meets SIRS criteria, there is no evidence of life-threatening organ dysfunction to suggest sepsis (per 2016 SEPSIS-3 guidelines).  Patient is certainly at high risk though.   - Telemetry monitoring - S/p 500 cc bolus - Push oral fluid rehydration - Continue Rocephin 2 g daily - Urine and blood cultures pending - Respiratory viral panel

## 2023-08-08 NOTE — Assessment & Plan Note (Signed)
Patient's family was concerned that patient was not acting as herself this morning was frequently repeating statements.  She apparently has a history of doing this over the last year but it seems worse today.  On personal examination, she seems back to baseline.  - Aspiration precautions - Delirium precautions

## 2023-08-08 NOTE — ED Provider Notes (Signed)
[facility] Provider Note    Event Date/Time   First MD Initiated Contact with Patient 08/08/23 1039     (approximate)   History   Recurrent UTI and Altered Mental Status Level V Caveat: AMS  HPI  Jocelyn Hooper is a 82 y.o. female with a history of recurrent UTIs presents to the ER for evaluation of altered mental status.  Recently had antibiotics having reported increased urinary frequency and foul odor with urination.  Family reports patient more confused.  She is very perseverative is not certain as to why she was brought to the ER today.  No reported bowel falls.  Denies any pain.     Physical Exam   Triage Vital Signs: ED Triage Vitals  Encounter Vitals Group     BP      Systolic BP Percentile      Diastolic BP Percentile      Pulse      Resp      Temp      Temp src      SpO2      Weight      Height      Head Circumference      Peak Flow      Pain Score      Pain Loc      Pain Education      Exclude from Growth Chart     Most recent vital signs: Vitals:   08/08/23 1330 08/08/23 1430  BP: 125/69 (!) 116/58  Pulse: (!) 106 93  Resp: 17 18  Temp:    SpO2: 97% 98%     Constitutional: Alert, perseverative Eyes: Conjunctivae are normal.  Head: Atraumatic. Nose: No congestion/rhinnorhea. Mouth/Throat: Mucous membranes are moist.   Neck: Painless ROM.  Cardiovascular:   Good peripheral circulation. Respiratory: Normal respiratory effort.  No retractions.  Gastrointestinal: Soft and nontender.  Musculoskeletal:  no deformity Neurologic:  MAE spontaneously. No gross focal neurologic deficits are appreciated.  Skin:  Skin is warm, dry and intact. No rash noted. Psychiatric: Mood and affect are normal. Speech and behavior are normal.    ED Results / Procedures / Treatments   Labs (all labs ordered are listed, but only abnormal results are displayed) Labs Reviewed  COMPREHENSIVE METABOLIC PANEL - Abnormal; Notable for the  following components:      Result Value   Sodium 133 (*)    CO2 18 (*)    Glucose, Bld 106 (*)    All other components within normal limits  CBC WITH DIFFERENTIAL/PLATELET - Abnormal; Notable for the following components:   WBC 12.6 (*)    Neutro Abs 11.0 (*)    All other components within normal limits  URINALYSIS, W/ REFLEX TO CULTURE (INFECTION SUSPECTED) - Abnormal; Notable for the following components:   Color, Urine YELLOW (*)    APPearance CLOUDY (*)    Ketones, ur 5 (*)    Bacteria, UA RARE (*)    All other components within normal limits  RESP PANEL BY RT-PCR (RSV, FLU A&B, COVID)  [id number]  CULTURE, BLOOD (ROUTINE X 2)  CULTURE, BLOOD (ROUTINE X 2)  LACTIC ACID, PLASMA  PROTIME-INR  APTT     EKG  ED ECG REPORT I, Willy Eddy, the attending physician, personally viewed and interpreted this ECG.   Date: 08/08/2023  EKG Time: 10:49  Rate: 115  Rhythm: sinus  Axis: normal  Intervals: normal  ST&T Change: no stemi, no depressions    RADIOLOGY  Please see ED Course for my review and interpretation.  I personally reviewed all radiographic images ordered to evaluate for the above acute complaints and reviewed radiology reports and findings.  These findings were personally discussed with the patient.  Please see medical record for radiology report.    PROCEDURES:  Critical Care performed: No  Procedures   MEDICATIONS ORDERED IN ED: Medications  cefTRIAXone (ROCEPHIN) 2 g in sodium chloride 0.9 % 100 mL IVPB (0 g Intravenous Stopped 08/08/23 1212)  acetaminophen (TYLENOL) tablet 650 mg (650 mg Oral Given 08/08/23 1315)  sodium chloride 0.9 % bolus 500 mL (500 mLs Intravenous New Bag/Given 08/08/23 1401)  iohexol (OMNIPAQUE) 300 MG/ML solution 75 mL (75 mLs Intravenous Contrast Given 08/08/23 1458)     IMPRESSION / MDM / ASSESSMENT AND PLAN / ED COURSE  I reviewed the triage vital signs and the nursing notes.                               Differential diagnosis includes, but is not limited to, Dehydration, sepsis, pna, uti, hypoglycemia, cva, drug effect, withdrawal, encephalitis   Patient presenting to the ER for evaluation of symptoms as described above.  Based on symptoms, risk factors and considered above differential, this presenting complaint could reflect a potentially life-threatening illness therefore the patient will be placed on continuous pulse oximetry and telemetry for monitoring.  Laboratory evaluation will be sent to evaluate for the above complaints.      Clinical Course as of 08/08/23 1515  Sun Aug 08, 2023  1318 Lactate normal.  Mild leukocytosis.  Chest x-ray on my review and interpretation without evidence of consolidation.  Is complaining of some left lower quadrant hip pain urinary urgency will order CT imaging to further evaluate of possible source of sepsis.  Does not appear meningitic. [PR]  1512 CT abdomen pelvis on my review and interpretation without [PR]    Clinical Course User Index [PR] Willy Eddy, MD     FINAL CLINICAL IMPRESSION(S) / ED DIAGNOSES   Final diagnoses:  Sepsis with encephalopathy without septic shock, due to unspecified organism [facility])     Rx / DC Orders   ED Discharge Orders     None        Note:  This document was prepared using Dragon voice recognition software and may include unintentional dictation errors.    Willy Eddy, MD 08/08/23 1515

## 2023-08-08 NOTE — Assessment & Plan Note (Signed)
-   Continue home aspirin

## 2023-08-08 NOTE — ED Triage Notes (Signed)
Pt arrives via EMS from home. Pt was recently treated for a UTI, she completed a course of abx was doing better and the symptoms returned. Pt was running a fever this morning, reports strong odor with her urine. Family reported to ems that she was also having some new confusion. Pt took 1000mg  of tylenol pta.

## 2023-08-09 ENCOUNTER — Inpatient Hospital Stay: Payer: PPO

## 2023-08-09 DIAGNOSIS — M7602 Gluteal tendinitis, left hip: Secondary | ICD-10-CM | POA: Diagnosis not present

## 2023-08-09 DIAGNOSIS — M4807 Spinal stenosis, lumbosacral region: Secondary | ICD-10-CM | POA: Diagnosis not present

## 2023-08-09 DIAGNOSIS — G8929 Other chronic pain: Secondary | ICD-10-CM | POA: Diagnosis not present

## 2023-08-09 DIAGNOSIS — R6510 Systemic inflammatory response syndrome (SIRS) of non-infectious origin without acute organ dysfunction: Secondary | ICD-10-CM | POA: Diagnosis not present

## 2023-08-09 DIAGNOSIS — M160 Bilateral primary osteoarthritis of hip: Secondary | ICD-10-CM | POA: Diagnosis not present

## 2023-08-09 DIAGNOSIS — M5105 Intervertebral disc disorders with myelopathy, thoracolumbar region: Secondary | ICD-10-CM | POA: Diagnosis not present

## 2023-08-09 DIAGNOSIS — M25552 Pain in left hip: Secondary | ICD-10-CM | POA: Diagnosis not present

## 2023-08-09 DIAGNOSIS — M47816 Spondylosis without myelopathy or radiculopathy, lumbar region: Secondary | ICD-10-CM | POA: Diagnosis not present

## 2023-08-09 DIAGNOSIS — M48061 Spinal stenosis, lumbar region without neurogenic claudication: Secondary | ICD-10-CM | POA: Diagnosis not present

## 2023-08-09 LAB — URINE CULTURE

## 2023-08-09 LAB — BLOOD CULTURE ID PANEL (REFLEXED) - BCID2

## 2023-08-09 LAB — LACTIC ACID, PLASMA
Lactic Acid, Venous: 2.8 mmol/L (ref 0.5–1.9)
Lactic Acid, Venous: 1.3 mmol/L (ref 0.5–1.9)

## 2023-08-09 LAB — BASIC METABOLIC PANEL
Anion gap: 10 (ref 5–15)
BUN: 18 mg/dL (ref 8–23)
CO2: 16 mmol/L — ABNORMAL LOW (ref 22–32)
Calcium: 8.9 mg/dL (ref 8.9–10.3)
Chloride: 109 mmol/L (ref 98–111)
Creatinine, Ser: 0.88 mg/dL (ref 0.44–1.00)
GFR, Estimated: >60 mL/min (ref >60)
Glucose, Bld: 87 mg/dL (ref 70–99)
Potassium: 3.3 mmol/L — ABNORMAL LOW (ref 3.5–5.1)
Sodium: 135 mmol/L (ref 135–145)

## 2023-08-09 LAB — VITAMIN B12: Vitamin B-12: 544 pg/mL (ref 180–914)

## 2023-08-09 LAB — CBC
HCT: 37.1 % (ref 36.0–46.0)
Hemoglobin: 12.1 g/dL (ref 12.0–15.0)
MCH: 31.3 pg (ref 26.0–34.0)
MCHC: 32.6 g/dL (ref 30.0–36.0)
MCV: 95.9 fL (ref 80.0–100.0)
Platelets: 181 10*3/uL (ref 150–400)
RBC: 3.87 MIL/uL (ref 3.87–5.11)
RDW: 14.3 % (ref 11.5–15.5)
WBC: 18.9 10*3/uL — ABNORMAL HIGH (ref 4.0–10.5)
nRBC: 0 % (ref 0.0–0.2)

## 2023-08-09 MED ORDER — ACETAMINOPHEN 120 MG RE SUPP
120.0000 mg | Freq: Once | RECTAL | Status: DC
  Filled 2023-08-09: qty 1

## 2023-08-09 MED ORDER — ENSURE ENLIVE PO LIQD
237.0000 mL | Freq: Two times a day (BID) | ORAL | Status: DC
  Administered 2023-08-11 – 2023-08-30 (×22): 237 mL via ORAL

## 2023-08-09 MED ORDER — VANCOMYCIN HCL 750 MG/150ML IV SOLN
750.0000 mg | INTRAVENOUS | Status: DC
  Administered 2023-08-09 – 2023-08-11 (×3): 750 mg via INTRAVENOUS
  Filled 2023-08-09 (×4): qty 150

## 2023-08-09 MED ORDER — ORAL CARE MOUTH RINSE: 15.0000 mL | OROMUCOSAL | Status: DC | PRN

## 2023-08-09 MED ORDER — HYDROCODONE-ACETAMINOPHEN 5-325 MG PO TABS
1.0000 | ORAL_TABLET | Freq: Three times a day (TID) | ORAL | Status: DC | PRN
  Administered 2023-08-10 – 2023-08-14 (×4): 1 via ORAL
  Filled 2023-08-09 (×4): qty 1

## 2023-08-09 MED ORDER — POTASSIUM CHLORIDE CRYS ER 20 MEQ PO TBCR
40.0000 meq | EXTENDED_RELEASE_TABLET | Freq: Once | ORAL | Status: AC
  Administered 2023-08-09: 40 meq via ORAL
  Filled 2023-08-09: qty 2

## 2023-08-09 MED ORDER — VANCOMYCIN HCL 1250 MG/250ML IV SOLN
1250.0000 mg | Freq: Once | INTRAVENOUS | Status: AC
  Administered 2023-08-09: 1250 mg via INTRAVENOUS
  Filled 2023-08-09: qty 250

## 2023-08-09 MED ORDER — ACETAMINOPHEN 80 MG RE SUPP: 80.0000 mg | Freq: Once | RECTAL | Status: DC

## 2023-08-09 MED ORDER — LACTATED RINGERS IV BOLUS
1000.0000 mL | Freq: Once | INTRAVENOUS | Status: AC
  Administered 2023-08-09: 1000 mL via INTRAVENOUS

## 2023-08-09 MED ORDER — LACTATED RINGERS IV SOLN: INTRAVENOUS | Status: AC

## 2023-08-09 MED ORDER — LACTATED RINGERS IV BOLUS
500.0000 mL | Freq: Once | INTRAVENOUS | Status: AC
  Administered 2023-08-09: 500 mL via INTRAVENOUS

## 2023-08-09 MED ORDER — VANCOMYCIN HCL IN DEXTROSE 1-5 GM/200ML-% IV SOLN: 1000.0000 mg | INTRAVENOUS | Status: DC

## 2023-08-09 NOTE — Progress Notes (Signed)
Pharmacy Antibiotic Note  Jocelyn Hooper is a 82 y.o. female admitted on 08/08/2023 with bacteremia.  Pharmacy has been consulted for Vancomycin dosing. Recently lumbar fusion in sept.  Treated for UTI in October (no cultures available, only UAs).    Today, 08/09/2023 Day # 1 vancomycin Renal: SCr 0.88 (up from 0.75 at admit) WBC 18.9 Tm 101.9 11/24 blood cx: MRSA Hip pain - MRI pending  Plan: Adjust vancomycin to 750mg  IV q24h (next dose due 2200 tonight) based on change in CrCl from small rise in SCr Goal AUC 400-600 Predicted AUC 473 using Scr 0.88, TBW 56kg Monitor renal function Check vancomycin level if vancomycin to continue Await repeat blood cultures, ECHO, etc.  ID following  Height: 5\' 5"  (165.1 cm) Weight: 56 kg (123 lb 7.3 oz) IBW/kg (Calculated) : 57  Temp (24hrs), Avg:98.9 F (37.2 C), Min:97.8 F (36.6 C), Max:101.9 F (38.8 C)  Recent Labs  Lab 08/08/23 1048 08/09/23 0744  WBC 12.6* 18.9*  CREATININE 0.75 0.88  LATICACIDVEN 1.0  --     Estimated Creatinine Clearance: 42.1 mL/min (by C-G formula based on SCr of 0.88 mg/dL).    Allergies  Allergen Reactions   Avelox [Moxifloxacin Hcl In Nacl] Other (See Comments)    Redness all over in about 5 minutes after taking   Codeine Nausea Only   Acyclovir And Related     Turn red all over    Shrimp [Shellfish Allergy]     Hives     Ciprofloxacin Rash   Moxifloxacin Hives and Rash   Sucralfate Rash    Itching     Antimicrobials this admission: 11/27 ceftriaxone X 1 11/25 vancomycin >>  Dose adjustments this admission:   Microbiology results:  11/24 BCx: 4/4 GPC, BCID MRSA   11/24 UCx:     Thank you for allowing pharmacy to be a part of this patient's care.  Juliette Alcide, PharmD, BCPS, BCIDP Work Cell: [phone] 08/09/2023 3:26 PM

## 2023-08-09 NOTE — Progress Notes (Signed)
MEWS Progress Note  Patient Details Name: Jocelyn Hooper MRN: [id number] DOB: 05/09/1941 Today's Date: 08/09/2023   MEWS Flowsheet Documentation:  Assess: MEWS Score Temp: (!) 103.9 F (39.9 C) BP: 109/63 MAP (mmHg): 74 Pulse Rate: (!) 155 ECG Heart Rate: (!) 153 Resp: (!) 27 Level of Consciousness: Alert SpO2: 100 % O2 Device: Room Air Assess: MEWS Score MEWS Temp: 2 MEWS Systolic: 0 MEWS Pulse: 3 MEWS RR: 2 MEWS LOC: 0 MEWS Score: 7 MEWS Score Color: Red Assess: SIRS CRITERIA SIRS Temperature : 1 SIRS Respirations : 1 SIRS Pulse: 1 SIRS WBC: 1 SIRS Score Sum : 4 SIRS Temperature : 1 SIRS Pulse: 1 SIRS Respirations : 1 SIRS WBC: 1 SIRS Score Sum : 4 Assess: if the MEWS score is Yellow or Red Were vital signs accurate and taken at a resting state?: Yes Does the patient meet 2 or more of the SIRS criteria?: Yes Does the patient have a confirmed or suspected source of infection?: No MEWS guidelines implemented : Yes, red Treat MEWS Interventions: Considered administering scheduled or prn medications/treatments as ordered Take Vital Signs Increase Vital Sign Frequency : Red: Q1hr x2, continue Q4hrs until patient remains green for 12hrs Escalate MEWS: Escalate: Red: Discuss with charge nurse and notify provider. Consider notifying RRT. If remains red for 2 hours consider need for higher level of care Notify: Charge Nurse/RN Name of Charge Nurse/RN Notified: amanda, rn Provider Notification Provider Name/Title: dr. Joylene Igo Date Provider Notified: 08/09/23 Time Provider Notified: 1610 Method of Notification: Page Notification Reason: Change in status, Critical Result, New onset of dysrhythmia Provider response: See new orders Date of Provider Response: 08/09/23 Time of Provider Response: 1620      Laqueta Due 08/09/2023, 5:07 PM

## 2023-08-09 NOTE — Telephone Encounter (Signed)
No answer. Patient is currently hospital.  Closing encounter.

## 2023-08-09 NOTE — Progress Notes (Signed)
Progress Note   Patient: Jocelyn Hooper QMV:[id number] DOB: August 28, 1941 DOA: 08/08/2023     1 DOS: the patient was seen and examined on 08/09/2023   Brief hospital course:  Jocelyn Hooper is a 82 y.o. female with medical history significant of iron deficiency anemia, B12 deficiency, arteriosclerotic disease, nephrolithiasis, scoliosis s/p recent intervention (06/09/2023), who presents to the ED due to altered mental status.   History obtained from both patient and her family at bedside.  Jocelyn Hooper states that she experiencing worsening of her left hip pain and has been told in the past she has bursitis.  Her husband at bedside states that yesterday, she climbed the stairs numerous times in preparation for the holidays.  Jocelyn Hooper also endorses bladder fullness and feels as though she is unable to urinate.  She denies any dysuria, frequency, urgency or abdominal pain though.  She denies any cough, shortness of breath, chest pain, palpitations, lower extremity swelling.  Jocelyn Hooper states that yesterday, she was in her usual state of health but upon awakening today, he noted that she was more confused and had a fever.  He notes that she has frequent UTIs.   Jocelyn Hooper at bedside states that patient has had occasional memory issues and repeats herself multiple times but it seems much worse today.   ED course: On arrival to the ED, patient was hypertensive at 139/85 with heart rate of 117.  She was afebrile at 99.4 but subsequently developed a fever of 101.5.  She was saturating at 99% on room air.  Initial workup notable for WBC of 12.6, sodium of 133, bicarb of 18, creatinine 0.75 with GFR above 60.  Lactic acid within normal limits.  COVID-19 negative.  Urinalysis with ketonuria and rare bacteria.  CT of the abdomen was obtained and results pending.  Chest x-ray was obtained with no active disease.  Patient was started on IV fluids and Rocephin.  TRH contacted for admission.     Assessment and  Plan:  Sepsis MRSA bacteremia As evidenced by fever with a Tmax of 101.9, tachycardia, tachypnea, marked leukocytosis with a white count of 18,000 Blood cultures yield gram-positive cocci, BC ID positive for MRSA Unclear source, patient with complaints of severe left hip pain and is status post recent L5-S1 decompression, instrumentation and fusion on the patient on 06/09/2023.  Continue empiric antibiotic therapy with IV vancomycin Obtain MRI of lumbar spine and left hip Obtain 2D echocardiogram to assess for possible endocarditis Will request ID consult     Acute urinary retention Significant bladder distention noted on CT imaging with Jocelyn inability to urinate afterwards.   In-N-Out catheter with 600 cc.  Unclear etiology Continue frequent bladder scans with In-N-Out as needed    Acute metabolic encephalopathy Secondary to sepsis.  Improved back to baseline Jocelyn family on admission was concerned that patient was not acting herself and was frequently repeating statements.         ASCVD (arteriosclerotic cardiovascular disease) Continue home aspirin and metoprolol    Hypokalemia Supplement potassium     Subjective: Patient is seen and examined at the bedside.  Her family is at the bedside.  Continues to complain of severe left hip pain which she rates a 7 x 10 in intensity at its worst.  Physical Exam: Vitals:   08/09/23 0703 08/09/23 0904 08/09/23 0905 08/09/23 0948  BP:   123/66   Pulse:   (!) 107   Resp:  20  20  Temp:  98.3 F (36.8 C)   TempSrc:      SpO2:   99%   Weight: 56 kg     Height: 5\' 5"  (1.651 m)      Vitals and nursing note reviewed.  Constitutional:      Appearance: Chronically ill-appearing HENT:     Head: Normocephalic and atraumatic.  Eyes:     Conjunctiva/sclera: Conjunctivae normal.     Pupils: Pupils are equal, round, and reactive to light.  Cardiovascular:     Rate and Rhythm: Normal rate and regular rhythm.     Heart  sounds: No murmur heard.    No gallop.  Pulmonary:     Effort: Pulmonary effort is normal. No respiratory distress.     Breath sounds: Normal breath sounds. No wheezing, rhonchi or rales.  Abdominal:     General: Bowel sounds are normal. There is no distension.     Palpations: Abdomen is soft.     Tenderness: There is no abdominal tenderness. There is no guarding.  Musculoskeletal:     Right lower leg: No edema.     Left lower leg: No edema.       Legs:  Skin:    Comments: 3 cm inch incision on lower spine with overlying scab. No surrounding erythema, TTP or discharge.   Neurological:     Mental Status: She is alert.     Comments:  Patient is alert and oriented, seems to be answering questions appropriately  Psychiatric:        Mood and Affect: Mood normal.    Data Reviewed: Labs reviewed.  Potassium 3.3, bicarb 16, white count 18.9 There are no new results to review at this time.  Family Communication: Plan of care discussed with Jocelyn family at the bedside.  All questions and concerns have been addressed.  Disposition: Status is: Inpatient Remains inpatient appropriate because: On IV antibiotics for MRSA bacteremia  Planned Discharge Destination:  TBD    Time spent: 36 minutes  Author: Lucile Shutters, MD 08/09/2023 11:16 AM  For on call review [email or web address].

## 2023-08-09 NOTE — Progress Notes (Signed)
Dr Joylene Igo and RN Harrold Donath made aware of critical lactic 2.8, acknowledged, per MD continue IV fluids

## 2023-08-09 NOTE — Progress Notes (Signed)
Pharmacy Antibiotic Note  Jocelyn Hooper is a 82 y.o. female admitted on 08/08/2023 with bacteremia.  Pharmacy has been consulted for Vancomycin dosing.  Plan: Vancomycin 1250 mg IV X 1 ordered for 11/25 @ ~ 0100. Vancomycin 1 gm IV Q24H ordered to start 11/26 @ 0100.  AUC = 548.6 Vanc trough = 13.5   Height: 5\' 5"  (165.1 cm) Weight: 58.2 kg (128 lb 6.4 oz) IBW/kg (Calculated) : 57  Temp (24hrs), Avg:99.4 F (37.4 C), Min:98 F (36.7 C), Max:101.5 F (38.6 C)  Recent Labs  Lab 08/08/23 1048  WBC 12.6*  CREATININE 0.75  LATICACIDVEN 1.0    Estimated Creatinine Clearance: 47.1 mL/min (by C-G formula based on SCr of 0.75 mg/dL).    Allergies  Allergen Reactions   Avelox [Moxifloxacin Hcl In Nacl] Other (See Comments)    Redness all over in about 5 minutes after taking   Codeine Nausea Only   Acyclovir And Related     Turn red all over    Shrimp [Shellfish Allergy]     Hives     Ciprofloxacin Rash   Moxifloxacin Hives and Rash   Sucralfate Rash    Itching     Antimicrobials this admission:   >>    >>   Dose adjustments this admission:   Microbiology results:  BCx:   UCx:    Sputum:    MRSA PCR:   Thank you for allowing pharmacy to be a part of this patient's care.  Cail[place] General D 08/09/2023 1:10 AM

## 2023-08-09 NOTE — ED Notes (Signed)
Pt sts she feels better after in and out catherization.

## 2023-08-09 NOTE — Consult Note (Signed)
[facility]    Date of Admission:  08/08/2023     Reason for Consult: mrsa bsi    Referring Provider: Elwyn Lade Agbata; autochamp     Lines:  Peripheral iv's  Abx: 11/24-c vanc       11/24 ceftriaxone  Assessment: 82 yo female hx b12 deficiancy, pvd, scoliosis, hx bilateral knee arthroplasty, recent lumbar surgery 06/09/23, recent outpatient uti dx given cefdinir a month prior to admission, admitted 11/24 with acute ams and acute on chronic left lateral hip pain, found to have mrsa bsi   Mrsi bsi unclear source. Her surgical lumbar spine site is without sx and no wound dehiscence. No culture available on uti a month prior to admission and no complaint of uti sx otherwise. She only has left hip tenderness laterally this admission and confusion without other localizing sx  So far modified duke criteria possible suggestion of IE  I would repeat bcx for now and get mri imaging of the left hip; if obvious bone/joint involvement suggestion of the left femur/acetabular joint and blood cx clears and no evidence of embolic phenemenom would defer tee  Ams ?metabolic --> tx with abx and if no improvement would get mri brain r/o embolic phenomenom    Plan: Tte Repeat bcx tomorrow Mri left hip If mri left hip nonconcerning or repeat bcx positive, would consider tee Continue vanc (better cns penetration) until metabolic encephalopathy pictures is more clearly defined If worsening cns status in 2-3 days would get brain mri r/o embolic cns process from potential IE Discussed with primary team      ------------------------------------------------ Principal Problem:   SIRS (systemic inflammatory response syndrome) (HCC) Active Problems:   ASCVD (arteriosclerotic cardiovascular disease)   B12 deficiency   Acute urinary retention   Altered mental status    HPI: Jocelyn Hooper is a 82 y.o. female hx b12 deficiancy, pvd, scoliosis, recent lumbar  surgery 06/09/23, recent outpatient uti dx given cefdinir a month prior to admission, admitted 11/24 with acute ams and acute on chronic left lateral hip pain, found to have mrsa bsi  Hx via patient (who appears confused), her son available at bedside who knows about her well, and chart review   She was admitted for confusion and acute on chronic left hip pain  She has had left hip pain for several months but worse over the last several days She was treated for uti with omnicef by pcp a month prior to this admission. She recalls no hx to it in terms of sx. At this time no uti sx otherwise  She has lumbar spine fusion 05/2023 and no sx since or at this time  She has old bilateral knee arthroplasty and no sx  Per her son she remains confused at this time but denies focal neurologic deficit or visual change otherwise  She denies having dental pain or any sx of bilateral knee which she had arthroplasty previously   On admissioN: Febrile Wbc 18 Normal lft and creatinine Started on bs abx  Bcx returned with mrsa 4 out of 4 bottles    Family History  Problem Relation Age of Onset   Heart attack Mother 7   Heart failure Mother    Heart attack Father    Heart disease Father        CABG   Heart attack Brother 43       premature   Breast cancer Sister 24  Social History   Tobacco Use   Smoking status: Never   Smokeless tobacco: Never  Vaping Use   Vaping status: Never Used  Substance Use Topics   Alcohol use: No   Drug use: No    Allergies  Allergen Reactions   Avelox [Moxifloxacin Hcl In Nacl] Other (See Comments)    Redness all over in about 5 minutes after taking   Codeine Nausea Only   Acyclovir And Related     Turn red all over    Shrimp [Shellfish Allergy]     Hives     Ciprofloxacin Rash   Moxifloxacin Hives and Rash   Sucralfate Rash    Itching     Review of Systems: ROS All Other ROS was negative, except mentioned above   Past Medical History:   Diagnosis Date   Bursitis of left shoulder    Chest pain, unspecified    Complication of anesthesia    PT HAS SEVERE SCOLIOSIS AND FOR TKR ANESTHESIA COULD NOT GET A SPINAL   Coronary artery disease    ascvd   CSF leak    DDD (degenerative disc disease), cervical    Diverticulosis    Esophageal reflux    Fibrocystic breast disease    GERD (gastroesophageal reflux disease)    Headache    H/O   History of colon polyps    History of hiatal hernia    History of kidney stones    IBS (irritable bowel syndrome)    Nasal polyps    Other and unspecified hyperlipidemia    Scoliosis    Tinnitus of both ears    Trigeminal neuralgia syndrome    Unspecified sinusitis (chronic)    Vitamin B12 deficiency        Scheduled Meds:  aspirin EC  81 mg Oral Daily   enoxaparin (LOVENOX) injection  40 mg Subcutaneous Q24H   gabapentin  300 mg Oral QHS   loratadine  10 mg Oral Daily   metoprolol succinate  25 mg Oral Daily   sodium chloride flush  3 mL Intravenous Q12H   topiramate  150 mg Oral QHS   Continuous Infusions:  [START ON 08/10/2023] vancomycin     PRN Meds:.acetaminophen **OR** acetaminophen, HYDROcodone-acetaminophen, ondansetron **OR** ondansetron (ZOFRAN) IV, polyethylene glycol   OBJECTIVE: Blood pressure 123/66, pulse (!) 107, temperature 98.3 F (36.8 C), resp. rate 20, height 5\' 5"  (1.651 m), weight 56 kg, SpO2 99%.  Physical Exam  General/constitutional: no distress, pleasant, verbal; slowed train of thoughts and per son inaccurate recall of events HEENT: Normocephalic, PER, Conj Clear, EOMI, Oropharynx clear Neck supple CV: rrr no mrg Lungs: clear to auscultation, normal respiratory effort Abd: Soft, Nontender Ext: no edema Skin: No Rash Neuro: nonfocal MSK: nontender lumbar spine; no open wound; left hip tender laterally to palpation but on internal/external rotation no groin tenderness; bilateral knee arthroplasties sign --> no  warmths/redness/tenderness   Lab Results Lab Results  Component Value Date   WBC 18.9 (H) 08/09/2023   HGB 12.1 08/09/2023   HCT 37.1 08/09/2023   MCV 95.9 08/09/2023   PLT 181 08/09/2023    Lab Results  Component Value Date   CREATININE 0.88 08/09/2023   BUN 18 08/09/2023   NA 135 08/09/2023   K 3.3 (L) 08/09/2023   CL 109 08/09/2023   CO2 16 (L) 08/09/2023    Lab Results  Component Value Date   ALT 18 08/08/2023   AST 40 08/08/2023   ALKPHOS 73 08/08/2023  BILITOT 0.8 08/08/2023      Microbiology: Recent Results (from the past 240 hour(s))  Blood Culture (routine x 2)     Status: None (Preliminary result)   Collection Time: 08/08/23 10:48 AM   Specimen: BLOOD  Result Value Ref Range Status   Specimen Description   Final    BLOOD Performed at [facility], [address] [place], [place] [address]    Special Requests   Final    BOTTLES DRAWN AEROBIC AND ANAEROBIC Blood Culture adequate volume Performed at [facility], [address] [place], [place] [address]    Culture  Setup Time   Final    GRAM POSITIVE COCCI IN BOTH AEROBIC AND ANAEROBIC BOTTLES STAPHYLOCOCCUS AUREUS CRITICAL RESULT CALLED TO, READ BACK BY AND VERIFIED WITH: JASON ROBBINS @ 0056 08/09/23 BH    Culture GRAM POSITIVE COCCI  Final   Report Status PENDING  Incomplete  Blood Culture (routine x 2)     Status: None (Preliminary result)   Collection Time: 08/08/23 10:53 AM   Specimen: BLOOD  Result Value Ref Range Status   Specimen Description   Final    BLOOD RIGHT ANTECUBITAL Performed at [facility], [address], [place], [place] [address]    Special Requests   Final    BOTTLES DRAWN AEROBIC AND ANAEROBIC Blood Culture adequate volume Performed at [facility], [address] [place], [place] [address]    Culture  Setup Time   Final    IN BOTH AEROBIC AND ANAEROBIC BOTTLES GRAM POSITIVE COCCI Organism ID to follow STAPHYLOCOCCUS  AUREUS CRITICAL RESULT CALLED TO, READ BACK BY AND VERIFIED WITH: JASON ROBBINS @ 0056 08/09/23 [facility]    Culture GRAM POSITIVE COCCI  Final   Report Status PENDING  Incomplete  Blood Culture ID Panel (Reflexed)     Status: Abnormal   Collection Time: 08/08/23 10:53 AM  Result Value Ref Range Status   Enterococcus faecalis NOT DETECTED NOT DETECTED Final   Enterococcus Faecium NOT DETECTED NOT DETECTED Final   Listeria monocytogenes NOT DETECTED NOT DETECTED Final   Staphylococcus species DETECTED (A) NOT DETECTED Final    Comment: CRITICAL RESULT CALLED TO, READ BACK BY AND VERIFIED WITH: JASON ROBBINS @ 0056 08/09/23 BH    Staphylococcus aureus (BCID) DETECTED (A) NOT DETECTED Final    Comment: Methicillin (oxacillin)-resistant Staphylococcus aureus (MRSA). MRSA is predictably resistant to beta-lactam antibiotics (except ceftaroline). Preferred therapy is vancomycin unless clinically contraindicated. Patient requires contact precautions if  hospitalized. CRITICAL RESULT CALLED TO, READ BACK BY AND VERIFIED WITH: JASON ROBBINS @ 0056 08/09/23 BH    Staphylococcus epidermidis NOT DETECTED NOT DETECTED Final   Staphylococcus lugdunensis NOT DETECTED NOT DETECTED Final   Streptococcus species NOT DETECTED NOT DETECTED Final   Streptococcus agalactiae NOT DETECTED NOT DETECTED Final   Streptococcus pneumoniae NOT DETECTED NOT DETECTED Final   Streptococcus pyogenes NOT DETECTED NOT DETECTED Final   A.calcoaceticus-baumannii NOT DETECTED NOT DETECTED Final   Bacteroides fragilis NOT DETECTED NOT DETECTED Final   Enterobacterales NOT DETECTED NOT DETECTED Final   Enterobacter cloacae complex NOT DETECTED NOT DETECTED Final   Escherichia coli NOT DETECTED NOT DETECTED Final   Klebsiella aerogenes NOT DETECTED NOT DETECTED Final   Klebsiella oxytoca NOT DETECTED NOT DETECTED Final   Klebsiella pneumoniae NOT DETECTED NOT DETECTED Final   Proteus species NOT DETECTED NOT DETECTED Final    Salmonella species NOT DETECTED NOT DETECTED Final   Serratia marcescens NOT DETECTED NOT DETECTED  Final   Haemophilus influenzae NOT DETECTED NOT DETECTED Final   Neisseria meningitidis NOT DETECTED NOT DETECTED Final   Pseudomonas aeruginosa NOT DETECTED NOT DETECTED Final   Stenotrophomonas maltophilia NOT DETECTED NOT DETECTED Final   Candida albicans NOT DETECTED NOT DETECTED Final   Candida auris NOT DETECTED NOT DETECTED Final   Candida glabrata NOT DETECTED NOT DETECTED Final   Candida krusei NOT DETECTED NOT DETECTED Final   Candida parapsilosis NOT DETECTED NOT DETECTED Final   Candida tropicalis NOT DETECTED NOT DETECTED Final   Cryptococcus neoformans/gattii NOT DETECTED NOT DETECTED Final   Meth resistant mecA/C and MREJ DETECTED (A) NOT DETECTED Final    Comment: CRITICAL RESULT CALLED TO, READ BACK BY AND VERIFIED WITHBarbara Cower ROBBINS @ 0056 08/09/23 BH Performed at [facility], [address] [place], [place] [address]   Resp panel by RT-PCR (RSV, Flu A&B, Covid) Anterior Nasal Swab     Status: None   Collection Time: 08/08/23 11:43 AM   Specimen: Anterior Nasal Swab  Result Value Ref Range Status   SARS Coronavirus 2 by RT PCR NEGATIVE NEGATIVE Final    Comment: (NOTE) SARS-CoV-2 target nucleic acids are NOT DETECTED.  The SARS-CoV-2 RNA is generally detectable in upper respiratory specimens during the acute phase of infection. The lowest concentration of SARS-CoV-2 viral copies this assay can detect is 138 copies/mL. A negative result does not preclude SARS-Cov-2 infection and should not be used as the sole basis for treatment or other patient management decisions. A negative result may occur with  improper specimen collection/handling, submission of specimen other than nasopharyngeal swab, presence of viral mutation(s) within the areas targeted by this assay, and inadequate number of viral copies(<138 copies/mL). A negative result must be combined  with clinical observations, patient history, and epidemiological information. The expected result is Negative.  Fact Sheet for Patients:  BloggerCourse.com  Fact Sheet for Healthcare Providers:  SeriousBroker.it  This test is no t yet approved or cleared by the [place] FDA and  has been authorized for detection and/or diagnosis of SARS-CoV-2 by FDA under an Emergency Use Authorization (EUA). This EUA will remain  in effect (meaning this test can be used) for the duration of the COVID-19 declaration under Section 564(b)(1) of the Act, 21 U.S.C.section 360bbb-3(b)(1), unless the authorization is terminated  or revoked sooner.       Influenza A by PCR NEGATIVE NEGATIVE Final   Influenza B by PCR NEGATIVE NEGATIVE Final    Comment: (NOTE) The Xpert Xpress SARS-CoV-2/FLU/RSV plus assay is intended as an aid in the diagnosis of influenza from Nasopharyngeal swab specimens and should not be used as a sole basis for treatment. Nasal washings and aspirates are unacceptable for Xpert Xpress SARS-CoV-2/FLU/RSV testing.  Fact Sheet for Patients: BloggerCourse.com  Fact Sheet for Healthcare Providers: SeriousBroker.it  This test is not yet approved or cleared by the [place] FDA and has been authorized for detection and/or diagnosis of SARS-CoV-2 by FDA under an Emergency Use Authorization (EUA). This EUA will remain in effect (meaning this test can be used) for the duration of the COVID-19 declaration under Section 564(b)(1) of the Act, 21 U.S.C. section 360bbb-3(b)(1), unless the authorization is terminated or revoked.     Resp Syncytial Virus by PCR NEGATIVE NEGATIVE Final    Comment: (NOTE) Fact Sheet for Patients: BloggerCourse.com  Fact Sheet for Healthcare Providers: SeriousBroker.it  This test is not yet approved  or cleared by the [place] and has been authorized  for detection and/or diagnosis of SARS-CoV-2 by FDA under an Emergency Use Authorization (EUA). This EUA will remain in effect (meaning this test can be used) for the duration of the COVID-19 declaration under Section 564(b)(1) of the Act, 21 U.S.C. section 360bbb-3(b)(1), unless the authorization is terminated or revoked.  Performed at [facility], [address], [place], [place] [address]      Serology:    Imaging: If present, new imagings (plain films, ct scans, and mri) have been personally visualized and interpreted; radiology reports have been reviewed. Decision making incorporated into the Impression / Recommendations.  11/24 ct abd pelv *No acute inflammatory process identified within the abdomen or pelvis. *Bilateral single nonobstructing renal calculi noted. No hydroureteronephrosis on either side. Urinary bladder is distended but otherwise unremarkable. *Multiple other nonacute observations, as described above.   11/24 ct head No acute intracranial abnormality.    11/25 pelvis xray 1. Mild bilateral femoroacetabular osteoarthritis. 2. Moderate pubic symphysis osteoarthritis.    Raymondo Band, MD [facility] [phone] pager    08/09/2023, 12:11 PM

## 2023-08-09 NOTE — Progress Notes (Signed)
PHARMACY - PHYSICIAN COMMUNICATION CRITICAL VALUE ALERT - BLOOD CULTURE IDENTIFICATION (BCID)  Jocelyn Hooper is an 82 y.o. female who presented to [facility] on 08/08/2023 with a chief complaint of SIRS.   Assessment:  Staph aureus in 3 of 4 bottles, Mec A +  (include suspected source if known)  Name of physician (or Provider) Contacted: Larkin Ina, NP   Current antibiotics: Ceftriaxone 2 gm IV Q24H   Changes to prescribed antibiotics recommended:  Recommendations accepted by provider - Will d/c ceftriaxone and start Vancomycin  No results found for this or any previous visit.  Lameisha Schuenemann D 08/09/2023  1:08 AM

## 2023-08-09 NOTE — TOC Initial Note (Signed)
Transition of Care [facility]) - Initial/Assessment Note    Patient Details  Name: Jocelyn Hooper MRN: [id number] Date of Birth: [birth year]  Transition of Care [facility]) CM/SW Contact:    Allena Katz, LCSW Phone Number: 08/09/2023, 11:13 AM  Clinical Narrative:   Pt admitted from home with AMS. Pt lives currently with husband. Per RN note, patient needs assistance with ADLS. TOC will continue to follow for discharge needs.                Expected Discharge Plan: Home w Home Health Services Barriers to Discharge: Continued Medical Work up   Patient Goals and CMS Choice Patient states their goals for this hospitalization and ongoing recovery are:: unknown pt has AMS          Expected Discharge Plan and Services                                              Prior Living Arrangements/Services   Lives with:: Spouse Patient language and need for interpreter reviewed:: Yes                 Activities of Daily Living      Permission Sought/Granted                  Emotional Assessment       Orientation: : Fluctuating Orientation (Suspected and/or reported Sundowners)      Admission diagnosis:  SIRS (systemic inflammatory response syndrome) (HCC) [R65.10] Sepsis with encephalopathy without septic shock, due to unspecified organism (HCC) [A41.9, R65.20, G93.41] Patient Active Problem List   Diagnosis Date Noted   SIRS (systemic inflammatory response syndrome) (HCC) 08/08/2023   Acute urinary retention 08/08/2023   Altered mental status 08/08/2023   Spondylolisthesis of lumbosacral region 06/09/2023   Localized edema 05/18/2018   S/P total knee arthroplasty 09/27/2017   ASCVD (arteriosclerotic cardiovascular disease) 10/15/2016   Esophagitis, reflux 10/15/2016   GERD (gastroesophageal reflux disease) 10/07/2016   Acute renal insufficiency 10/07/2016   Thrombocytopenia (HCC) 10/07/2016   Rectocele 05/28/2016   Post-herpetic polyneuropathy 05/17/2016    Bladder outlet obstruction 05/15/2016   Internal derangement of right knee 09/13/2015   Status post total left knee replacement 09/09/2015   B12 deficiency 12/28/2014   Trigeminal neuralgia syndrome 12/28/2014   Thoracogenic scoliosis of thoracic region 12/28/2014   DDD (degenerative disc disease), lumbar 05/09/2014   Kidney stone 06/01/2012   Renal colic 06/01/2012   Sciatica 06/01/2012   EDEMA 10/18/2009   Mixed hyperlipidemia 02/20/2009   Sinusitis, chronic 02/20/2009   Asthma 02/20/2009   GERD 02/20/2009   Exertional shortness of breath 02/20/2009   Chest pain with moderate risk for cardiac etiology 02/20/2009   PCP:  Danella Penton, MD Pharmacy:   [facility] - [place], [place] - [address] [place] [address] Phone: [phone] Fax: [phone]     Social Determinants of Health (SDOH) Social History: SDOH Screenings   Food Insecurity: No Food Insecurity (07/01/2023)   Received from [facility]  Transportation Needs: No Transportation Needs (07/01/2023)   Received from [facility]  Utilities: Not At Risk (07/01/2023)   Received from [facility]  Financial Resource Strain: Low Risk  (07/01/2023)   Received from [facility]  Tobacco Use: Low Risk  (08/08/2023)   SDOH Interventions:  Readmission Risk Interventions     No data to display

## 2023-08-09 NOTE — ED Notes (Signed)
Bladder scanner showed approx 576cc of urine in bladder

## 2023-08-10 ENCOUNTER — Inpatient Hospital Stay: Payer: PPO

## 2023-08-10 DIAGNOSIS — R9389 Abnormal findings on diagnostic imaging of other specified body structures: Secondary | ICD-10-CM | POA: Diagnosis not present

## 2023-08-10 DIAGNOSIS — R4182 Altered mental status, unspecified: Secondary | ICD-10-CM | POA: Diagnosis not present

## 2023-08-10 DIAGNOSIS — R918 Other nonspecific abnormal finding of lung field: Secondary | ICD-10-CM | POA: Diagnosis not present

## 2023-08-10 DIAGNOSIS — R0602 Shortness of breath: Secondary | ICD-10-CM | POA: Diagnosis not present

## 2023-08-10 DIAGNOSIS — R6510 Systemic inflammatory response syndrome (SIRS) of non-infectious origin without acute organ dysfunction: Secondary | ICD-10-CM | POA: Diagnosis not present

## 2023-08-10 LAB — CBC WITH DIFFERENTIAL/PLATELET
Abs Immature Granulocytes: 0.14 10*3/uL — ABNORMAL HIGH (ref 0.00–0.07)
Basophils Absolute: 0.1 10*3/uL (ref 0.0–0.1)
Basophils Relative: 0 %
Eosinophils Absolute: 0 10*3/uL (ref 0.0–0.5)
Eosinophils Relative: 0 %
HCT: 31.6 % — ABNORMAL LOW (ref 36.0–46.0)
Hemoglobin: 10.5 g/dL — ABNORMAL LOW (ref 12.0–15.0)
Immature Granulocytes: 1 %
Lymphocytes Relative: 3 %
Lymphs Abs: 0.5 10*3/uL — ABNORMAL LOW (ref 0.7–4.0)
MCH: 31.5 pg (ref 26.0–34.0)
MCHC: 33.2 g/dL (ref 30.0–36.0)
MCV: 94.9 fL (ref 80.0–100.0)
Monocytes Absolute: 0.2 10*3/uL (ref 0.1–1.0)
Monocytes Relative: 1 %
Neutro Abs: 17.3 10*3/uL — ABNORMAL HIGH (ref 1.7–7.7)
Neutrophils Relative %: 95 %
Platelets: 139 10*3/uL — ABNORMAL LOW (ref 150–400)
RBC: 3.33 MIL/uL — ABNORMAL LOW (ref 3.87–5.11)
RDW: 14.6 % (ref 11.5–15.5)
WBC: 18.2 10*3/uL — ABNORMAL HIGH (ref 4.0–10.5)
nRBC: 0 % (ref 0.0–0.2)

## 2023-08-10 LAB — BASIC METABOLIC PANEL
Anion gap: 10 (ref 5–15)
BUN: 18 mg/dL (ref 8–23)
CO2: 15 mmol/L — ABNORMAL LOW (ref 22–32)
Calcium: 8.7 mg/dL — ABNORMAL LOW (ref 8.9–10.3)
Chloride: 110 mmol/L (ref 98–111)
Creatinine, Ser: 0.75 mg/dL (ref 0.44–1.00)
GFR, Estimated: >60 mL/min (ref >60)
Glucose, Bld: 99 mg/dL (ref 70–99)
Potassium: 3.4 mmol/L — ABNORMAL LOW (ref 3.5–5.1)
Sodium: 135 mmol/L (ref 135–145)

## 2023-08-10 LAB — CREATININE, SERUM
Creatinine, Ser: 0.75 mg/dL (ref 0.44–1.00)
GFR, Estimated: >60 mL/min (ref >60)

## 2023-08-10 MED ORDER — LACTATED RINGERS IV BOLUS
500.0000 mL | Freq: Once | INTRAVENOUS | Status: AC
  Administered 2023-08-10: 500 mL via INTRAVENOUS

## 2023-08-10 MED ORDER — POTASSIUM CHLORIDE CRYS ER 20 MEQ PO TBCR
40.0000 meq | EXTENDED_RELEASE_TABLET | Freq: Once | ORAL | Status: AC
  Administered 2023-08-10: 40 meq via ORAL
  Filled 2023-08-10: qty 2

## 2023-08-10 MED ORDER — ACETAMINOPHEN 325 MG PO TABS
650.0000 mg | ORAL_TABLET | Freq: Four times a day (QID) | ORAL | Status: AC
  Administered 2023-08-10 (×3): 650 mg via ORAL
  Filled 2023-08-10 (×3): qty 2

## 2023-08-10 MED ORDER — LACTATED RINGERS IV SOLN: INTRAVENOUS | Status: AC

## 2023-08-10 MED ORDER — CHLORHEXIDINE GLUCONATE CLOTH 2 % EX PADS
6.0000 | MEDICATED_PAD | Freq: Every day | CUTANEOUS | Status: DC
  Administered 2023-08-10 – 2023-08-30 (×21): 6 via TOPICAL

## 2023-08-10 NOTE — Progress Notes (Signed)
Progress Note   Patient: Jocelyn Hooper WGN:[id number] DOB: 12-Jul-1941 DOA: 08/08/2023     2 DOS: the patient was seen and examined on 08/10/2023   Brief hospital course:  CATHERN CROTTS is a 82 y.o. female with medical history significant of iron deficiency anemia, B12 deficiency, arteriosclerotic disease, nephrolithiasis, scoliosis s/p recent intervention (06/09/2023), who presents to the ED due to altered mental status.   History obtained from both patient and her family at bedside.  Mrs. Oriordan states that she experiencing worsening of her left hip pain and has been told in the past she has bursitis.  Her husband at bedside states that yesterday, she climbed the stairs numerous times in preparation for the holidays.  Mrs. Joubert also endorses bladder fullness and feels as though she is unable to urinate.  She denies any dysuria, frequency, urgency or abdominal pain though.  She denies any cough, shortness of breath, chest pain, palpitations, lower extremity swelling.  Mr. Greisen states that yesterday, she was in her usual state of health but upon awakening today, he noted that she was more confused and had a fever.  He notes that she has frequent UTIs.   [name] at bedside states that patient has had occasional memory issues and repeats herself multiple times but it seems much worse today.   ED course: On arrival to the ED, patient was hypertensive at 139/85 with heart rate of 117.  She was afebrile at 99.4 but subsequently developed a fever of 101.5.  She was saturating at 99% on room air.  Initial workup notable for WBC of 12.6, sodium of 133, bicarb of 18, creatinine 0.75 with GFR above 60.  Lactic acid within normal limits.  COVID-19 negative.  Urinalysis with ketonuria and rare bacteria.  CT of the abdomen was obtained and results pending.  Chest x-ray was obtained with no active disease.  Patient was started on IV fluids and Rocephin.  TRH contacted for admission.        Assessment  and Plan:  Sepsis MRSA bacteremia As evidenced by fever with a Tmax of 101.9, tachycardia, tachypnea, marked leukocytosis with a white count of 18,000 Patient continues to spike fever with Tmax of 103.  Will place patient on scheduled acetaminophen.  Repeat blood cultures Blood cultures yield gram-positive cocci, BC ID positive for MRSA Unclear source, patient with complaints of severe left hip pain and is status post recent L5-S1 decompression, instrumentation and fusion on the patient on 06/09/2023.  Appreciate ID input Continue empiric antibiotic therapy with IV vancomycin MRI of the lumbar spine shows status post L5-S1 spinal fusion with a superior endplate compression deformity of the L5 level that is unchanged from 08/08/2023. Eccentric left disc bulges at L4-L5 and L5-S1 with obliteration of the left lateral recesses and severe left neural foraminal narrowing at these levels. Findings have progressed compared to lumbar spine MRI dated 01/11/2023. MRI of the left hip shows  mild osteoarthritis of the left hip.  No acute findings. Small bilateral hip joint effusions. Mild tendinosis of the bilateral gluteus medius and minimus tendons. Mild right peritrochanteric bursitis. Mild tendinosis of the bilateral hamstring tendon origins.      Patient is scheduled for TEE today to rule out active endocarditis         Acute urinary retention Significant bladder distention noted on CT imaging with patient's inability to urinate afterwards.   In-N-Out catheter with 600 cc.  Unclear etiology Foley catheter in place      Acute  metabolic encephalopathy Secondary to sepsis.  Improved back to baseline Patient's family on admission was concerned that patient was not acting herself and was frequently repeating statements.            ASCVD (arteriosclerotic cardiovascular disease) Continue home aspirin and metoprolol       Hypokalemia Supplement potassium             Subjective:  Sitting up in bed.  Better.  Family at the bedside.  Physical Exam: Vitals:   08/10/23 0412 08/10/23 0812 08/10/23 0909 08/10/23 1015  BP: (!) 98/49 110/68  (!) 90/49  Pulse: 89 98  (!) 102  Resp:    18  Temp: 98.1 F (36.7 C) (!) 103.7 F (39.8 C) 100.2 F (37.9 C) 99.2 F (37.3 C)  TempSrc:   Oral   SpO2: 99%   96%  Weight:      Height:       Vitals and nursing note reviewed.  Constitutional:      Appearance: Chronically ill-appearing HENT:     Head: Normocephalic and atraumatic.  Eyes:     Conjunctiva/sclera: Conjunctivae normal.     Pupils: Pupils are equal, round, and reactive to light.  Cardiovascular:     Rate and Rhythm: Normal rate and regular rhythm.     Heart sounds: No murmur heard.    No gallop.  Pulmonary:     Effort: Pulmonary effort is normal. No respiratory distress.     Breath sounds: Normal breath sounds. No wheezing, rhonchi or rales.  Abdominal:     General: Bowel sounds are normal. There is no distension.     Palpations: Abdomen is soft.     Tenderness: There is no abdominal tenderness. There is no guarding.  Musculoskeletal:     Right lower leg: No edema.     Left lower leg: No edema.       Legs:  Skin:    Comments: 3 cm inch incision on lower spine with overlying scab. No surrounding erythema, TTP or discharge.   Neurological:     Mental Status: She is alert.     Comments:  Patient is alert and oriented, seems to be answering questions appropriately  Psychiatric:        Mood and Affect: Mood normal.       Data Reviewed: Labs reviewed.  Persistent leukocytosis of 18.2, potassium 3.4 There are no new results to review at this time.  Family Communication: Plan of care was discussed with patient and her family at the bedside.  They verbalized understanding and agree with the plan  Disposition: Status is: Inpatient Remains inpatient appropriate because: Treatment of MRSA bacteremia  Planned Discharge Destination:  TBD    Time  spent: 38 minutes  Author: Lucile Shutters, MD 08/10/2023 11:22 AM  For on call review [email or web address].

## 2023-08-10 NOTE — Progress Notes (Signed)
[facility]  Date of Admission:  08/08/2023     Lines:  Peripheral iv's   Abx: 11/24-c vanc                                                              11/24 ceftriaxone   Assessment: 82 yo female hx b12 deficiancy, pvd, scoliosis, hx bilateral knee arthroplasty, recent lumbar surgery 06/09/23, recent outpatient uti dx given cefdinir a month prior to admission, admitted 11/24 with acute ams and acute on chronic left lateral hip pain, found to have mrsa bsi     Mrsi bsi unclear source. Her surgical lumbar spine site is without sx and no wound dehiscence. No culture available on uti a month prior to admission and no complaint of uti sx otherwise. She only has left hip tenderness laterally this admission and confusion without other localizing sx   So far modified duke criteria possible suggestion of IE   I would repeat bcx for now and get mri imaging of the left hip; if obvious bone/joint involvement suggestion of the left femur/acetabular joint and blood cx clears and no evidence of embolic phenemenom would defer tee   Ams ?metabolic --> tx with abx and if no improvement would get mri brain r/o embolic phenomenom  --------- 08/10/23 id assessment Overall appears clinically same as yesterday when I first saw her Mentation still slow Ongoing fever Pending repeat blood cx from today 11/25 left hip mri demonstrate no obvious sign of infection of the joint/femur/left-pelvis 11/25 lspine mri also demonstrate no sign of bone / joint infection there (no sx there)  -Discussed with cards today about tee -f/u repeat bcx -continue vancomycin -I do not see need to do mri brain at this time -depending on clinical course of sepsis we might need to repeat imaging in a few days if ongoing fever (location pending further evaluation) -discussed with primary team     Principal Problem:   SIRS (systemic inflammatory response syndrome) (HCC) Active  Problems:   ASCVD (arteriosclerotic cardiovascular disease)   B12 deficiency   Acute urinary retention   Altered mental status   Allergies  Allergen Reactions   Avelox [Moxifloxacin Hcl In Nacl] Other (See Comments)    Redness all over in about 5 minutes after taking   Codeine Nausea Only   Acyclovir And Related     Turn red all over    Shrimp [Shellfish Allergy]     Hives     Ciprofloxacin Rash   Moxifloxacin Hives and Rash   Sucralfate Rash    Itching     Scheduled Meds:  acetaminophen  120 mg Rectal Once   acetaminophen  650 mg Oral Q6H   aspirin EC  81 mg Oral Daily   Chlorhexidine Gluconate Cloth  6 each Topical Daily   enoxaparin (LOVENOX) injection  40 mg Subcutaneous Q24H   feeding supplement  237 mL Oral BID BM   gabapentin  300 mg Oral QHS   loratadine  10 mg Oral Daily   metoprolol succinate  25 mg Oral Daily   potassium chloride  40 mEq Oral Once   sodium chloride flush  3 mL Intravenous Q12H   topiramate  150 mg Oral QHS   Continuous Infusions:  lactated ringers 125 mL/hr at 08/10/23 0811   vancomycin Stopped (08/09/23 2243)   PRN Meds:.acetaminophen **OR** acetaminophen, HYDROcodone-acetaminophen, ondansetron **OR** ondansetron (ZOFRAN) IV, mouth rinse, polyethylene glycol   SUBJECTIVE: No complaint; said left hip pain better but just got opioid Tolerating vanc Hip and l spine mri discussed  Daughter at bedside reports patient mentating better  Review of Systems: ROS All other ROS was negative, except mentioned above     OBJECTIVE: Vitals:   08/10/23 0412 08/10/23 0812 08/10/23 0909 08/10/23 1015  BP: (!) 98/49 110/68  (!) 90/49  Pulse: 89 98  (!) 102  Resp:    18  Temp: 98.1 F (36.7 C) (!) 103.7 F (39.8 C) 100.2 F (37.9 C) 99.2 F (37.3 C)  TempSrc:   Oral   SpO2: 99%   96%  Weight:      Height:       Body mass index is 20.54 kg/m.  Physical Exam General/constitutional: no distress, pleasant; slow at thoughts processing  still stable from yesterday HEENT: Normocephalic, PER, Conj Clear, EOMI, Oropharynx clear Neck supple CV: rrr no mrg Lungs: clear to auscultation, normal respiratory effort Abd: Soft, Nontender Ext: no edema Skin: No Rash Neuro: slow thought process; otherwise nonfocal -- generalized weakness with bilateral LE 4-5/5 weaker than upper bilateral LE; symmetric strength bilaterally MSK: just got some opioid and left lateral hip area not as tender   Lab Results Lab Results  Component Value Date   WBC 18.2 (H) 08/10/2023   HGB 10.5 (L) 08/10/2023   HCT 31.6 (L) 08/10/2023   MCV 94.9 08/10/2023   PLT 139 (L) 08/10/2023    Lab Results  Component Value Date   CREATININE 0.75 08/10/2023   BUN 18 08/10/2023   NA 135 08/10/2023   K 3.4 (L) 08/10/2023   CL 110 08/10/2023   CO2 15 (L) 08/10/2023    Lab Results  Component Value Date   ALT 18 08/08/2023   AST 40 08/08/2023   ALKPHOS 73 08/08/2023   BILITOT 0.8 08/08/2023      Microbiology: Recent Results (from the past 240 hour(s))  Blood Culture (routine x 2)     Status: Abnormal (Preliminary result)   Collection Time: 08/08/23 10:48 AM   Specimen: BLOOD  Result Value Ref Range Status   Specimen Description   Final    BLOOD Performed at [facility], [address], [place], [place] [address]    Special Requests   Final    BOTTLES DRAWN AEROBIC AND ANAEROBIC Blood Culture adequate volume Performed at [facility], [address] [place], [place] [address]    Culture  Setup Time   Final    GRAM POSITIVE COCCI IN BOTH AEROBIC AND ANAEROBIC BOTTLES STAPHYLOCOCCUS AUREUS CRITICAL RESULT CALLED TO, READ BACK BY AND VERIFIED WITH: JASON ROBBINS @ 0056 08/09/23 BH    Culture STAPHYLOCOCCUS AUREUS (A)  Final   Report Status PENDING  Incomplete  Blood Culture (routine x 2)     Status: None (Preliminary result)   Collection Time: 08/08/23 10:53 AM   Specimen: BLOOD  Result Value Ref Range Status   Specimen  Description   Final    BLOOD RIGHT ANTECUBITAL Performed at [facility], [address] [place], [place] [address]    Special Requests   Final    BOTTLES DRAWN AEROBIC AND ANAEROBIC Blood Culture adequate volume Performed at [facility], [address] [place], [place] [address]    Culture  Setup Time  Final    IN BOTH AEROBIC AND ANAEROBIC BOTTLES GRAM POSITIVE COCCI Organism ID to follow STAPHYLOCOCCUS AUREUS CRITICAL RESULT CALLED TO, READ BACK BY AND VERIFIED WITH: JASON ROBBINS @ 0056 08/09/23 [facility]    Culture GRAM POSITIVE COCCI  Final   Report Status PENDING  Incomplete  Blood Culture ID Panel (Reflexed)     Status: Abnormal   Collection Time: 08/08/23 10:53 AM  Result Value Ref Range Status   Enterococcus faecalis NOT DETECTED NOT DETECTED Final   Enterococcus Faecium NOT DETECTED NOT DETECTED Final   Listeria monocytogenes NOT DETECTED NOT DETECTED Final   Staphylococcus species DETECTED (A) NOT DETECTED Final    Comment: CRITICAL RESULT CALLED TO, READ BACK BY AND VERIFIED WITH: JASON ROBBINS @ 0056 08/09/23 BH    Staphylococcus aureus (BCID) DETECTED (A) NOT DETECTED Final    Comment: Methicillin (oxacillin)-resistant Staphylococcus aureus (MRSA). MRSA is predictably resistant to beta-lactam antibiotics (except ceftaroline). Preferred therapy is vancomycin unless clinically contraindicated. Patient requires contact precautions if  hospitalized. CRITICAL RESULT CALLED TO, READ BACK BY AND VERIFIED WITH: JASON ROBBINS @ 0056 08/09/23 BH    Staphylococcus epidermidis NOT DETECTED NOT DETECTED Final   Staphylococcus lugdunensis NOT DETECTED NOT DETECTED Final   Streptococcus species NOT DETECTED NOT DETECTED Final   Streptococcus agalactiae NOT DETECTED NOT DETECTED Final   Streptococcus pneumoniae NOT DETECTED NOT DETECTED Final   Streptococcus pyogenes NOT DETECTED NOT DETECTED Final   A.calcoaceticus-baumannii NOT DETECTED NOT DETECTED Final    Bacteroides fragilis NOT DETECTED NOT DETECTED Final   Enterobacterales NOT DETECTED NOT DETECTED Final   Enterobacter cloacae complex NOT DETECTED NOT DETECTED Final   Escherichia coli NOT DETECTED NOT DETECTED Final   Klebsiella aerogenes NOT DETECTED NOT DETECTED Final   Klebsiella oxytoca NOT DETECTED NOT DETECTED Final   Klebsiella pneumoniae NOT DETECTED NOT DETECTED Final   Proteus species NOT DETECTED NOT DETECTED Final   Salmonella species NOT DETECTED NOT DETECTED Final   Serratia marcescens NOT DETECTED NOT DETECTED Final   Haemophilus influenzae NOT DETECTED NOT DETECTED Final   Neisseria meningitidis NOT DETECTED NOT DETECTED Final   Pseudomonas aeruginosa NOT DETECTED NOT DETECTED Final   Stenotrophomonas maltophilia NOT DETECTED NOT DETECTED Final   Candida albicans NOT DETECTED NOT DETECTED Final   Candida auris NOT DETECTED NOT DETECTED Final   Candida glabrata NOT DETECTED NOT DETECTED Final   Candida krusei NOT DETECTED NOT DETECTED Final   Candida parapsilosis NOT DETECTED NOT DETECTED Final   Candida tropicalis NOT DETECTED NOT DETECTED Final   Cryptococcus neoformans/gattii NOT DETECTED NOT DETECTED Final   Meth resistant mecA/C and MREJ DETECTED (A) NOT DETECTED Final    Comment: CRITICAL RESULT CALLED TO, READ BACK BY AND VERIFIED WITHPrincella Ion @ 0056 08/09/23 BH Performed at [facility], [address] [place], [place] [address]   Urine Culture (for pregnant, neutropenic or urologic patients or patients with an indwelling urinary catheter)     Status: Abnormal   Collection Time: 08/08/23 11:12 AM   Specimen: Urine, Catheterized  Result Value Ref Range Status   Specimen Description   Final    URINE, CATHETERIZED Performed at [facility], [address], [place], [place] [address]    Special Requests   Final    NONE Performed at [facility], [address] [place], [place] [address]    Culture MULTIPLE SPECIES  PRESENT, SUGGEST RECOLLECTION (A)  Final   Report Status 08/09/2023 FINAL  Final  Resp  panel by RT-PCR (RSV, Flu A&B, Covid) Anterior Nasal Swab     Status: None   Collection Time: 08/08/23 11:43 AM   Specimen: Anterior Nasal Swab  Result Value Ref Range Status   SARS Coronavirus 2 by RT PCR NEGATIVE NEGATIVE Final    Comment: (NOTE) SARS-CoV-2 target nucleic acids are NOT DETECTED.  The SARS-CoV-2 RNA is generally detectable in upper respiratory specimens during the acute phase of infection. The lowest concentration of SARS-CoV-2 viral copies this assay can detect is 138 copies/mL. A negative result does not preclude SARS-Cov-2 infection and should not be used as the sole basis for treatment or other patient management decisions. A negative result may occur with  improper specimen collection/handling, submission of specimen other than nasopharyngeal swab, presence of viral mutation(s) within the areas targeted by this assay, and inadequate number of viral copies(<138 copies/mL). A negative result must be combined with clinical observations, patient history, and epidemiological information. The expected result is Negative.  Fact Sheet for Patients:  BloggerCourse.com  Fact Sheet for Healthcare Providers:  SeriousBroker.it  This test is no t yet approved or cleared by the [place] FDA and  has been authorized for detection and/or diagnosis of SARS-CoV-2 by FDA under an Emergency Use Authorization (EUA). This EUA will remain  in effect (meaning this test can be used) for the duration of the COVID-19 declaration under Section 564(b)(1) of the Act, 21 U.S.C.section 360bbb-3(b)(1), unless the authorization is terminated  or revoked sooner.       Influenza A by PCR NEGATIVE NEGATIVE Final   Influenza B by PCR NEGATIVE NEGATIVE Final    Comment: (NOTE) The Xpert Xpress SARS-CoV-2/FLU/RSV plus assay is intended as an aid in the  diagnosis of influenza from Nasopharyngeal swab specimens and should not be used as a sole basis for treatment. Nasal washings and aspirates are unacceptable for Xpert Xpress SARS-CoV-2/FLU/RSV testing.  Fact Sheet for Patients: BloggerCourse.com  Fact Sheet for Healthcare Providers: SeriousBroker.it  This test is not yet approved or cleared by the [place] FDA and has been authorized for detection and/or diagnosis of SARS-CoV-2 by FDA under an Emergency Use Authorization (EUA). This EUA will remain in effect (meaning this test can be used) for the duration of the COVID-19 declaration under Section 564(b)(1) of the Act, 21 U.S.C. section 360bbb-3(b)(1), unless the authorization is terminated or revoked.     Resp Syncytial Virus by PCR NEGATIVE NEGATIVE Final    Comment: (NOTE) Fact Sheet for Patients: BloggerCourse.com  Fact Sheet for Healthcare Providers: SeriousBroker.it  This test is not yet approved or cleared by the [place] FDA and has been authorized for detection and/or diagnosis of SARS-CoV-2 by FDA under an Emergency Use Authorization (EUA). This EUA will remain in effect (meaning this test can be used) for the duration of the COVID-19 declaration under Section 564(b)(1) of the Act, 21 U.S.C. section 360bbb-3(b)(1), unless the authorization is terminated or revoked.  Performed at [facility], [address] [place], [place] [address]   Culture, blood (Routine X 2) w Reflex to ID Panel     Status: None (Preliminary result)   Collection Time: 08/10/23  5:29 AM   Specimen: BLOOD  Result Value Ref Range Status   Specimen Description BLOOD LEFT WRIST  Final   Special Requests   Final    BOTTLES DRAWN AEROBIC AND ANAEROBIC Blood Culture adequate volume   Culture   Final    NO GROWTH < 12 HOURS Performed at [facility],  [address] [place], [place] [address]    Report Status PENDING  Incomplete  Culture, blood (Routine X 2) w Reflex to ID Panel     Status: None (Preliminary result)   Collection Time: 08/10/23  6:04 AM   Specimen: BLOOD RIGHT HAND  Result Value Ref Range Status   Specimen Description BLOOD RIGHT HAND  Final   Special Requests   Final    BOTTLES DRAWN AEROBIC AND ANAEROBIC Blood Culture adequate volume   Culture   Final    NO GROWTH < 12 HOURS Performed at [facility], [address], [place], [place] [address]    Report Status PENDING  Incomplete     Serology:   Imaging: If present, new imagings (plain films, ct scans, and mri) have been personally visualized and interpreted; radiology reports have been reviewed. Decision making incorporated into the Impression / Recommendations.   11/26 cxr No formal report yet but my interpretation no acute pulm process including opacity    11/25 mri left hip 1. Mild osteoarthritis of the left hip.  No acute findings. 2. Small bilateral hip joint effusions. 3. Mild tendinosis of the bilateral gluteus medius and minimus tendons. Mild right peritrochanteric bursitis. 4. Mild tendinosis of the bilateral hamstring tendon origins.   11/25 mr l-spine 1. Status post L5-S1 spinal fusion with a superior endplate compression deformity of the L5 level that is unchanged from 08/08/2023. 2. Eccentric left disc bulges at L4-L5 and L5-S1 with obliteration of the left lateral recesses and severe left neural foraminal narrowing at these levels  Raymondo Band, MD [facility] [phone] pager    08/10/2023, 11:12 AM

## 2023-08-10 NOTE — Progress Notes (Signed)
   [facility] has been requested to perform a transesophageal echocardiogram on Jocelyn Hooper for bacteremia.  After careful review of history and examination, the risks and benefits of transesophageal echocardiogram have been explained including risks of esophageal damage, perforation (1:10,000 risk), bleeding, pharyngeal hematoma as well as other potential complications associated with conscious sedation including aspiration, arrhythmia, respiratory failure and death. Alternatives to treatment were discussed, questions were answered. Patient is willing to proceed.   Calee Nugent David Stall, PA-C  08/10/2023 1:55 PM

## 2023-08-11 ENCOUNTER — Inpatient Hospital Stay (HOSPITAL_COMMUNITY)
Admit: 2023-08-11 | Discharge: 2023-08-11 | Disposition: A | Discharge status: Home / Self Care | Payer: PPO | Attending: Medical | Admitting: Medical

## 2023-08-11 ENCOUNTER — Inpatient Hospital Stay: Payer: PPO | Admitting: Anesthesiology

## 2023-08-11 ENCOUNTER — Encounter
Admission: EM | Disposition: A | Discharge status: Skilled Nursing Facility | Payer: Self-pay | Source: Home / Self Care | Attending: Internal Medicine

## 2023-08-11 DIAGNOSIS — B9562 Methicillin resistant Staphylococcus aureus infection as the cause of diseases classified elsewhere: Secondary | ICD-10-CM | POA: Diagnosis not present

## 2023-08-11 DIAGNOSIS — R6520 Severe sepsis without septic shock: Secondary | ICD-10-CM | POA: Diagnosis not present

## 2023-08-11 DIAGNOSIS — G9340 Encephalopathy, unspecified: Secondary | ICD-10-CM | POA: Diagnosis not present

## 2023-08-11 DIAGNOSIS — R338 Other retention of urine: Secondary | ICD-10-CM | POA: Diagnosis not present

## 2023-08-11 DIAGNOSIS — M4646 Discitis, unspecified, lumbar region: Secondary | ICD-10-CM | POA: Diagnosis not present

## 2023-08-11 DIAGNOSIS — R7881 Bacteremia: Secondary | ICD-10-CM | POA: Diagnosis present

## 2023-08-11 DIAGNOSIS — I340 Nonrheumatic mitral (valve) insufficiency: Secondary | ICD-10-CM

## 2023-08-11 DIAGNOSIS — G9341 Metabolic encephalopathy: Secondary | ICD-10-CM | POA: Diagnosis not present

## 2023-08-11 DIAGNOSIS — I38 Endocarditis, valve unspecified: Secondary | ICD-10-CM

## 2023-08-11 DIAGNOSIS — R6510 Systemic inflammatory response syndrome (SIRS) of non-infectious origin without acute organ dysfunction: Secondary | ICD-10-CM | POA: Diagnosis not present

## 2023-08-11 DIAGNOSIS — I361 Nonrheumatic tricuspid (valve) insufficiency: Secondary | ICD-10-CM

## 2023-08-11 DIAGNOSIS — A4102 Sepsis due to Methicillin resistant Staphylococcus aureus: Secondary | ICD-10-CM | POA: Diagnosis not present

## 2023-08-11 HISTORY — PX: TEE WITHOUT CARDIOVERSION: SHX5443

## 2023-08-11 LAB — CULTURE, BLOOD (ROUTINE X 2): Special Requests: ADEQUATE

## 2023-08-11 LAB — GLUCOSE, CAPILLARY: Glucose-Capillary: 81 mg/dL (ref 70–99)

## 2023-08-11 LAB — CREATININE, SERUM
Creatinine, Ser: 0.73 mg/dL (ref 0.44–1.00)
GFR, Estimated: >60 mL/min (ref >60)

## 2023-08-11 SURGERY — TRANSESOPHAGEAL ECHOCARDIOGRAM (TEE)
Anesthesia: General

## 2023-08-11 MED ORDER — FENTANYL CITRATE (PF) 100 MCG/2ML IJ SOLN
INTRAMUSCULAR | Status: AC | PRN
  Administered 2023-08-11 (×3): 25 ug via INTRAVENOUS

## 2023-08-11 MED ORDER — LIDOCAINE VISCOUS HCL 2 % MT SOLN
OROMUCOSAL | Status: AC
  Filled 2023-08-11: qty 15

## 2023-08-11 MED ORDER — MIDAZOLAM HCL 2 MG/2ML IJ SOLN
INTRAMUSCULAR | Status: AC
  Filled 2023-08-11: qty 4

## 2023-08-11 MED ORDER — MIDAZOLAM HCL 5 MG/5ML IJ SOLN
INTRAMUSCULAR | Status: AC | PRN
  Administered 2023-08-11: 1 mg via INTRAVENOUS

## 2023-08-11 MED ORDER — FENTANYL CITRATE (PF) 100 MCG/2ML IJ SOLN
INTRAMUSCULAR | Status: AC
  Filled 2023-08-11: qty 2

## 2023-08-11 MED ORDER — BUTAMBEN-TETRACAINE-BENZOCAINE 2-2-14 % EX AERO
INHALATION_SPRAY | CUTANEOUS | Status: AC
  Filled 2023-08-11: qty 5

## 2023-08-11 MED ORDER — SODIUM CHLORIDE 0.9% FLUSH: 10.0000 mL | Freq: Two times a day (BID) | INTRAVENOUS | Status: DC

## 2023-08-11 MED ORDER — BUTAMBEN-TETRACAINE-BENZOCAINE 2-2-14 % EX AERO
INHALATION_SPRAY | CUTANEOUS | Status: AC | PRN
  Administered 2023-08-11: 6 via TOPICAL

## 2023-08-11 MED ORDER — MIDAZOLAM HCL 2 MG/2ML IJ SOLN
INTRAMUSCULAR | Status: AC | PRN
  Administered 2023-08-11 (×2): 1 mg via INTRAVENOUS

## 2023-08-11 MED ORDER — LIDOCAINE VISCOUS HCL 2 % MT SOLN
OROMUCOSAL | Status: AC | PRN
  Administered 2023-08-11: 10 mL via OROMUCOSAL

## 2023-08-11 NOTE — Progress Notes (Signed)
Date of Admission:  08/08/2023     ID: Jocelyn Hooper is a 82 y.o. female  Principal Problem:   SIRS (systemic inflammatory response syndrome) (HCC) Active Problems:   ASCVD (arteriosclerotic cardiovascular disease)   B12 deficiency   Acute urinary retention   Altered mental status   Sepsis with encephalopathy without septic shock (HCC)   Bacteremia   Endocarditis  On 06/09/23 underwent L5-S1 decompression with fusion at [facility] B/l TKA ( Left 2013, Rt 2019) Left hip pain- says she has bursitits and gets injection  MRI hip showed bursa in rt peritrochanteric area  Subjective: She had TEE this morning Feels groggy Family at bed side ( daughter, son, husband, grandkids)  Medications:   acetaminophen  120 mg Rectal Once   aspirin EC  81 mg Oral Daily   butamben-tetracaine-benzocaine       Chlorhexidine Gluconate Cloth  6 each Topical Daily   enoxaparin (LOVENOX) injection  40 mg Subcutaneous Q24H   feeding supplement  237 mL Oral BID BM   fentaNYL       gabapentin  300 mg Oral QHS   lidocaine       loratadine  10 mg Oral Daily   metoprolol succinate  25 mg Oral Daily   midazolam       sodium chloride flush  3 mL Intravenous Q12H    Objective: Vital signs in last 24 hours: Patient Vitals for the past 24 hrs:  BP Temp Temp src Pulse Resp SpO2 Height Weight  08/11/23 0922 (!) 109/59 98.4 F (36.9 C) -- 91 16 96 % -- --  08/11/23 0900 115/60 -- -- 94 (!) 23 96 % -- --  08/11/23 0845 115/60 -- -- 98 20 96 % -- --  08/11/23 0830 (!) 116/58 -- -- (!) 102 (!) 22 99 % -- --  08/11/23 0825 110/62 -- -- -- -- -- -- --  08/11/23 0820 (!) 109/51 -- -- (!) 106 20 98 % -- --  08/11/23 0815 (!) 117/53 -- -- (!) 111 (!) 24 100 % -- --  08/11/23 0810 (!) 121/53 -- -- (!) 106 (!) 23 98 % -- --  08/11/23 0805 124/64 -- -- (!) 104 (!) 24 99 % -- --  08/11/23 0800 127/64 -- -- (!) 104 (!) 24 100 % -- --  08/11/23 0755 -- -- -- 100 (!) 25 98 % -- --  08/11/23 0750 -- -- -- 100 (!) 23 98 %  -- --  08/11/23 0745 126/64 -- -- 98 (!) 23 98 % -- --  08/11/23 0722 -- 99.1 F (37.3 C) Oral (!) 104 (!) 22 93 % 5\' 5"  (1.651 m) 56 kg  08/11/23 0351 (!) 109/57 98 F (36.7 C) Oral 97 -- 97 % -- --  08/11/23 0021 (!) 116/50 -- -- (!) 104 16 95 % -- --  08/10/23 2013 (!) 103/52 98.6 F (37 C) Oral 86 16 100 % -- --  08/10/23 1800 -- -- -- -- 18 -- -- --  08/10/23 1700 -- -- -- -- 20 -- -- --  08/10/23 1642 -- 98.5 F (36.9 C) Oral 91 20 98 % -- --  08/10/23 1618 118/60 -- -- -- -- -- -- --  08/10/23 1500 -- -- -- -- 18 -- -- --  08/10/23 1400 108/62 -- -- -- 18 -- -- --  08/10/23 1327 (!) 87/42 99 F (37.2 C) -- 91 18 97 % -- --  08/10/23 1300 -- -- -- -- 18 -- -- --  08/10/23 1200 -- -- -- -- 18 -- -- --  08/10/23 1159 (!) 93/50 99 F (37.2 C) -- 92 17 96 % -- --  08/10/23 1154 -- -- -- -- 18 -- -- --  08/10/23 1100 -- -- -- -- 18 -- -- --     LDA Foley   PHYSICAL EXAM:  General: Alert, cooperative, no distress, appears stated age.  Head: Normocephalic, without obvious abnormality, atraumatic. Eyes: Conjunctivae clear, anicteric sclerae. Pupils are equal ENT Nares normal. No drainage or sinus tenderness. Lips, mucosa, and tongue normal. No Thrush Neck: Supple, symmetrical, no adenopathy, thyroid: non tender no carotid bruit and no JVD. Back: No CVA tenderness. Lumbar surgical scar- has a scab Lungs: Clear to auscultation bilaterally. No Wheezing or Rhonchi. No rales. Heart: Regular rate and rhythm, no murmur, rub or gallop. Abdomen: Soft, non-tender,not distended. Bowel sounds normal. No masses Extremities: atraumatic, no cyanosis. No edema. No clubbing B/l knee surgical scars foley Skin: No rashes or lesions. Or bruising Lymph: Cervical, supraclavicular normal. Neurologic: Grossly non-focal  Lab Results    Latest Ref Rng & Units 08/10/2023    8:18 AM 08/09/2023    7:44 AM 08/08/2023   10:48 AM  CBC  WBC 4.0 - 10.5 K/uL 18.2  18.9  12.6   Hemoglobin 12.0 -  15.0 g/dL 40.9  81.1  91.4   Hematocrit 36.0 - 46.0 % 31.6  37.1  41.4   Platelets 150 - 400 K/uL 139  181  243        Latest Ref Rng & Units 08/11/2023    4:37 AM 08/10/2023    8:18 AM 08/10/2023    5:29 AM  CMP  Glucose 70 - 99 mg/dL  99    BUN 8 - 23 mg/dL  18    Creatinine 7.82 - 1.00 mg/dL 9.56  2.13  0.86   Sodium 135 - 145 mmol/L  135    Potassium 3.5 - 5.1 mmol/L  3.4    Chloride 98 - 111 mmol/L  110    CO2 22 - 32 mmol/L  15    Calcium 8.9 - 10.3 mg/dL  8.7        Microbiology: 08/08/23- 4/4 MRSA bacteremia 08/10/23-BC -   Studies/Results: DG Chest Port 1 View  Result Date: 08/10/2023 CLINICAL DATA:  Shortness of breath.  Altered mental status. EXAM: PORTABLE CHEST 1 VIEW COMPARISON:  08/08/2023 FINDINGS: Again noted is mild elevation of the right hemidiaphragm. Linear densities in the right chest are suggestive for chronic changes or atelectasis. Heart size is normal. No large areas of airspace disease or lung consolidation. Negative for a pneumothorax. Surgical clips in the right upper abdomen. IMPRESSION: 1. No acute cardiopulmonary disease. 2. Atelectasis or chronic changes in the right lung. Electronically Signed   By: Richarda Overlie M.D.   On: 08/10/2023 12:41   MR HIP LEFT WO CONTRAST  Result Date: 08/09/2023 CLINICAL DATA:  Chronic left hip pain EXAM: MR OF THE LEFT HIP WITHOUT CONTRAST TECHNIQUE: Multiplanar, multisequence MR imaging was performed. No intravenous contrast was administered. COMPARISON:  X-ray 08/09/2023 FINDINGS: Bones: No acute fracture. No dislocation. No femoral head avascular necrosis. Bony pelvis intact without diastasis. SI joints within normal limits. Mild-moderate arthropathy of the pubic symphysis. Susceptibility artifact related to prior lumbosacral fusion. Degenerative disc disease of the included lumbar spine. No bone marrow edema. No marrow replacing bone lesion. Articular cartilage and labrum Articular cartilage: Mild chondral thinning and  surface irregularity of the anterosuperior aspect of  the left hip. No subchondral marrow signal changes. Labrum:  Grossly intact.  No paralabral cyst. Joint or bursal effusion Joint effusion:  Small bilateral hip joint effusions. Bursae: Small volume right peritrochanteric bursal fluid. Muscles and tendons Muscles and tendons: Mild tendinosis of the bilateral gluteus medius and minimus tendons. Mild tendinosis of the bilateral hamstring tendon origins. The iliopsoas, rectus femoris, and adductor tendons appear intact without tear or significant tendinosis. Normal muscle bulk and signal intensity without edema, atrophy, or fatty infiltration. Other findings Miscellaneous: No soft tissue edema or fluid collection. No inguinal lymphadenopathy. Sigmoid diverticulosis. IMPRESSION: 1. Mild osteoarthritis of the left hip.  No acute findings. 2. Small bilateral hip joint effusions. 3. Mild tendinosis of the bilateral gluteus medius and minimus tendons. Mild right peritrochanteric bursitis. 4. Mild tendinosis of the bilateral hamstring tendon origins. Electronically Signed   By: Duanne Guess D.O.   On: 08/09/2023 16:14   MR LUMBAR SPINE WO CONTRAST  Result Date: 08/09/2023 CLINICAL DATA:  Myelopathy, acute, lumbar spine EXAM: MRI LUMBAR SPINE WITHOUT CONTRAST TECHNIQUE: Multiplanar, multisequence MR imaging of the lumbar spine was performed. No intravenous contrast was administered. COMPARISON:  Same day CT abdomen and pelvis, lumbar spine MRI 01/11/2023 FINDINGS: Segmentation:  Standard. Alignment: Levocurvature of the lumbar spine centered at the L2 vertebral body level Vertebrae: No evidence of discitis, or bone lesion. Status post L5-S1 spinal fusion. There is a superior endplate compression deformity at L5 that is unchanged from recent prior CT abdomen and pelvis dated 08/08/2023 Conus medullaris and cauda equina: Conus extends to the L1 level. Conus and cauda equina appear normal. Paraspinal and other soft  tissues: Negative. Disc levels: T12-L1: From disc bulge. No spinal canal narrowing. Significant neural foraminal narrowing. L1-L2: Eccentric right disc bulge. No spinal canal narrowing. No neural foraminal narrowing. L2-L3: Circumferential disc bulge. Mild bilateral facet degenerative change. No spinal canal narrowing. No significant neural foraminal narrowing. L3-L4: Eccentric left disc bulge. Moderate bilateral facet degenerative change. Moderate right and mild left neural foraminal narrowing. No significant spinal canal narrowing. L4-L5: Eccentric left disc bulge with obliteration of the left lateral recess. Mild overall spinal canal narrowing. Moderate to severe bilateral facet degenerative change. Severe left and moderate right neural foraminal narrowing. L5-S1: Fused level. Eccentric left disc bulge. There is obliteration of the left lateral recess. Severe left and moderate right neural foraminal narrowing. Mild overall spinal canal narrowing. IMPRESSION: 1. Status post L5-S1 spinal fusion with a superior endplate compression deformity of the L5 level that is unchanged from 08/08/2023. 2. Eccentric left disc bulges at L4-L5 and L5-S1 with obliteration of the left lateral recesses and severe left neural foraminal narrowing at these levels. Findings have progressed compared to lumbar spine MRI dated 01/11/2023 Electronically Signed   By: Lorenza Cambridge M.D.   On: 08/09/2023 12:39   DG HIP UNILAT W OR W/O PELVIS 2-3 VIEWS LEFT  Result Date: 08/09/2023 CLINICAL DATA:  Chronic left hip pain.  No known injury. EXAM: DG HIP (WITH OR WITHOUT PELVIS) 2-3V LEFT COMPARISON:  CT abdomen and pelvis 02/11/2022 and 08/08/2023 FINDINGS: There is diffuse decreased bone mineralization. Moderate pubic symphysis joint space narrowing, subchondral sclerosis, and subchondral cystic changes similar to prior. Mild bilateral superior femoroacetabular joint space narrowing. Normal morphology of the left femoral head-neck junction  without CAM-type bump deformity. No acute fracture is seen. No dislocation. Severe left L5-S1 disc space narrowing. New L4-5 bilateral transpedicular rod and screw fusion hardware compared to 02/11/2022, unchanged from 08/08/2023 CT yesterday. IMPRESSION:  1. Mild bilateral femoroacetabular osteoarthritis. 2. Moderate pubic symphysis osteoarthritis. Electronically Signed   By: Neita Garnet M.D.   On: 08/09/2023 12:10     Assessment/Plan: MRSA bacteremia High bio-burden Source not identified Left hip pain- MRI without contrast no obvious abnormality- may have to repeat with contrast Recent spine fusion ( sept 2024) Mri L/S spine - surgical site looks fine TEE no endocarditis Leucocytosis- repeat to see whether resolved- if still high would do tagged WBC scan to look for source Pt is currently on vanco Will need 4-6 weeks of IV antibiotic Dapto is an option Repeat blood culture has been sent- PICc can be placed on Friday  if blood culture remains neg   Encephalopathy almost back to baseline  Urinary retention- has foley  Discussed with patient, family and hopsitalist

## 2023-08-11 NOTE — Anesthesia Preprocedure Evaluation (Signed)
Anesthesia Evaluation  Patient identified by MRN, date of birth, ID band Patient awake    Reviewed: Allergy & Precautions, NPO status , Patient's Chart, lab work & pertinent test results  Airway Mallampati: II  TM Distance: >3 FB Neck ROM: full    Dental  (+) Teeth Intact   Pulmonary neg pulmonary ROS, asthma    Pulmonary exam normal        Cardiovascular Exercise Tolerance: Good + CAD  negative cardio ROS Normal cardiovascular exam Rhythm:Regular Rate:Normal     Neuro/Psych  Headaches negative neurological ROS  negative psych ROS   GI/Hepatic negative GI ROS, Neg liver ROS, hiatal hernia,GERD  Medicated,,  Endo/Other  negative endocrine ROS    Renal/GU negative Renal ROS  negative genitourinary   Musculoskeletal  (+) Arthritis ,    Abdominal Normal abdominal exam  (+)   Peds negative pediatric ROS (+)  Hematology negative hematology ROS (+)   Anesthesia Other Findings Past Medical History: No date: Bursitis of left shoulder No date: Chest pain, unspecified No date: Complication of anesthesia     Comment:  PT HAS SEVERE SCOLIOSIS AND FOR TKR ANESTHESIA COULD NOT              GET A SPINAL No date: Coronary artery disease     Comment:  ascvd No date: CSF leak No date: DDD (degenerative disc disease), cervical No date: Diverticulosis No date: Esophageal reflux No date: Fibrocystic breast disease No date: GERD (gastroesophageal reflux disease) No date: Headache     Comment:  H/O No date: History of colon polyps No date: History of hiatal hernia No date: History of kidney stones No date: IBS (irritable bowel syndrome) No date: Nasal polyps No date: Other and unspecified hyperlipidemia No date: Scoliosis No date: Tinnitus of both ears No date: Trigeminal neuralgia syndrome No date: Unspecified sinusitis (chronic) No date: Vitamin B12 deficiency  Past Surgical History: No date: ABDOMINAL  HYSTERECTOMY No date: APPENDECTOMY 1982: BACK SURGERY     Comment:  disc No date: CARDIAC CATHETERIZATION     Comment:  [facility]  2002: CHOLECYSTECTOMY 03/10/2017: COLONOSCOPY WITH PROPOFOL; N/A     Comment:  Procedure: COLONOSCOPY WITH PROPOFOL;  Surgeon: Scot Jun, MD;  Location: [facility] ENDOSCOPY;  Service:               Endoscopy;  Laterality: N/A; 01/13/2021: COLONOSCOPY WITH PROPOFOL; N/A     Comment:  Procedure: COLONOSCOPY WITH PROPOFOL;  Surgeon: Tol[place],               Boykin Nearing, MD;  Location: [facility] ENDOSCOPY;  Service:               Gastroenterology;  Laterality: N/A; 01/13/2021: ESOPHAGOGASTRODUODENOSCOPY (EGD) WITH PROPOFOL; N/A     Comment:  Procedure: ESOPHAGOGASTRODUODENOSCOPY (EGD) WITH               PROPOFOL;  Surgeon: Toledo, Boykin Nearing, MD;  Location:               [facility] ENDOSCOPY;  Service: Gastroenterology;  Laterality:               N/A; 01/09/2021: EXTRACORPOREAL SHOCK WAVE LITHOTRIPSY; Right     Comment:  Procedure: EXTRACORPOREAL SHOCK WAVE LITHOTRIPSY (ESWL);              Surgeon: Vanna Scotland, MD;  Location: [facility] ORS;  Service: Urology;  Laterality: Right; No date: EYE SURGERY; Bilateral     Comment:  Cataract Extraction with IOL 1974: hysterectomy (other) No date: JOINT REPLACEMENT     Comment:  LEFT knee 09/27/2017: KNEE ARTHROPLASTY; Right     Comment:  Procedure: COMPUTER ASSISTED TOTAL KNEE ARTHROPLASTY;                Surgeon: Donato Heinz, MD;  Location: [facility] ORS;                Service: Orthopedics;  Laterality: Right; 09/13/2015: KNEE ARTHROSCOPY; Right     Comment:  Procedure: ARTHROSCOPY KNEE, PARTIAL LATERAL               MENISECTOMY, CHONDROPLASTY ;  Surgeon: Donato Heinz,               MD;  Location: [facility] ORS;  Service: Orthopedics;                Laterality: Right; 2004: KNEE SURGERY     Comment:  having a lot of problems with left knee 1967, 1988, 1993, 2003, 2012: NASAL SINUS  SURGERY 1970,1990,1994,2005: sinus procedures 2015: TONSILLECTOMY 06/09/2023: TRANSFORAMINAL LUMBAR INTERBODY FUSION (TLIF) WITH PEDICLE  SCREW FIXATION 1 LEVEL; N/A     Comment:  Procedure: TRANSFOAMINAL LUMBAR INTERBODY               FUSION,INTERBODY PROSTHESIS,POSTERIOR INSTRUMENTATION               LUMBAR FIVE-SACRAL ONE;  Surgeon: Tressie Stalker, MD;                Location: [facility] OR;  Service: Neurosurgery;  Laterality:               N/A;  BMI    Body Mass Index: 20.54 kg/m      Reproductive/Obstetrics negative OB ROS                             Anesthesia Physical Anesthesia Plan  ASA: 3  Anesthesia Plan: General   Post-op Pain Management:    Induction: Intravenous  PONV Risk Score and Plan: Propofol infusion and TIVA  Airway Management Planned: Natural Airway and Nasal Cannula  Additional Equipment:   Intra-op Plan:   Post-operative Plan:   Informed Consent: I have reviewed the patients History and Physical, chart, labs and discussed the procedure including the risks, benefits and alternatives for the proposed anesthesia with the patient or authorized representative who has indicated his/her understanding and acceptance.     Dental Advisory Given  Plan Discussed with: CRNA and Surgeon  Anesthesia Plan Comments:        Anesthesia Quick Evaluation

## 2023-08-11 NOTE — Evaluation (Signed)
Occupational Therapy Evaluation Patient Details Name: TORRIE ULCH MRN: [id number] DOB: [birth year] Today's Date: 08/11/2023   History of Present Illness Pt is an 82 y/o female who presents s/p L5-S1 TLIF on 06/09/2023. PMH signficant for L shoulder bursitis, CAD, CSF leak, IBS, Scoliosis, Trigeminal neuralgia, prior back surgery 1982, B TKR.   Clinical Impression   Pt was seen for OT evaluation this date. Prior to hospital admission, pt was ind/mod I with all ADL's per family report. Pt lives with husband who helps with IADL's. Pt presents to acute OT demonstrating impaired ADL performance and functional mobility 2/2 (See OT problem list for additional functional deficits). Upon arrival to room pt supine in bed, agreeable to tx. Pt very groggy, confused and sleepy. Attempted to come from sup>sitting at the EOB with Max A. Pt unable to come into sitting upright, holding onto bed rails from putting weight on Lt hip d/t to pain. Pt returned to supine with HOB slightly elevated with Max A +use of rails. Pt completed face washing in bed with supervision. Pt left supine in bed with call bell within reach and all needs met. Pt would benefit from skilled OT services to address noted impairments and functional limitations (see below for any additional details) in order to maximize safety and independence while minimizing falls risk and caregiver burden. Anticipate the need for follow up OT services upon acute hospital DC.        If plan is discharge home, recommend the following: Help with stairs or ramp for entrance;Assist for transportation;Assistance with cooking/housework;A lot of help with bathing/dressing/bathroom;A lot of help with walking and/or transfers    Functional Status Assessment  Patient has had a recent decline in their functional status and demonstrates the ability to make significant improvements in function in a reasonable and predictable amount of time.  Equipment Recommendations   Other (comment) (Defer to next venue of care)    Recommendations for Other Services       Precautions / Restrictions Precautions Precautions: Fall Restrictions Weight Bearing Restrictions: No      Mobility Bed Mobility Overal bed mobility: Needs Assistance Bed Mobility: Supine to Sit     Supine to sit: Max assist, Used rails, HOB elevated     General bed mobility comments: Pt unable to come into sitting upright, holding onto bed rails from putting weight on Lt hip d/t to pain.    Transfers                          Balance Overall balance assessment: Mild deficits observed, not formally tested                                         ADL either performed or assessed with clinical judgement   ADL Overall ADL's : Needs assistance/impaired                                     Functional mobility during ADLs: Maximal assistance;Cueing for safety;Cueing for sequencing General ADL Comments: Pt completed face washing in bed with supervision. Pt very groggy and weak unable to sit upright at the EOB. Pt resisting sitting upright d/t to Left hip pain.     Vision Patient Visual Report: No change from baseline  Perception         Praxis         Pertinent Vitals/Pain Pain Assessment Pain Assessment: Faces Faces Pain Scale: Hurts even more Pain Descriptors / Indicators: Discomfort, Radiating, Grimacing, Guarding Pain Intervention(s): Limited activity within patient's tolerance, Monitored during session, Repositioned     Extremity/Trunk Assessment Upper Extremity Assessment Upper Extremity Assessment: Generalized weakness   Lower Extremity Assessment Lower Extremity Assessment: Defer to PT evaluation       Communication Communication Communication: No apparent difficulties Cueing Techniques: Verbal cues   Cognition Arousal: Lethargic Behavior During Therapy: WFL for tasks assessed/performed Overall Cognitive  Status: Impaired/Different from baseline                                 General Comments: Pt very groggy and confused, recently had endocardiogram done prior to OT session. Family states that pt is not at basline and has been confused and very weak.     General Comments       Exercises     Shoulder Instructions      Home Living Family/patient expects to be discharged to:: Private residence Living Arrangements: Spouse/significant other;Other relatives Available Help at Discharge: Family;Available 24 hours/day Type of Home: House Home Access: Stairs to enter [employer] of Steps: 3 Entrance Stairs-Rails: Left Home Layout: One level;Laundry or work area in basement;Able to live on main level with bedroom/bathroom     Bathroom Shower/Tub: Producer, television/film/video: Handicapped height     Home Equipment: Agricultural consultant (2 wheels);Cane - single point;Adaptive equipment Adaptive Equipment: Reacher;Long-handled sponge        Prior Functioning/Environment Prior Level of Function : Independent/Modified Independent             Mobility Comments: Family report pt has been using a RW recently. ADLs Comments: Pt states her husband was assisting her with ADL's after her knee replacements but she returned to independence. Husband now assists by taking laundry out of the washer/dryer for her. Family reports she is very independnet, was walking up and down the steps a week ago.        OT Problem List: Decreased strength;Decreased range of motion;Decreased safety awareness;Decreased activity tolerance;Decreased coordination;Impaired balance (sitting and/or standing)      OT Treatment/Interventions: Self-care/ADL training;Therapeutic exercise;Energy conservation;Therapeutic activities;Patient/family education    OT Goals(Current goals can be found in the care plan section) Acute Rehab OT Goals Patient Stated Goal: to get better OT Goal Formulation:  With patient/family Time For Goal Achievement: 08/25/23 Potential to Achieve Goals: Good  OT Frequency: Min 1X/week    Co-evaluation              AM-PAC OT "6 Clicks" Daily Activity     Outcome Measure Help from another person eating meals?: None Help from another person taking care of personal grooming?: A Little Help from another person toileting, which includes using toliet, bedpan, or urinal?: A Lot Help from another person bathing (including washing, rinsing, drying)?: A Lot Help from another person to put on and taking off regular upper body clothing?: A Little Help from another person to put on and taking off regular lower body clothing?: A Lot 6 Click Score: 16   End of Session    Activity Tolerance: Patient limited by pain Patient left: in bed;with bed alarm set;with call bell/phone within reach;with family/visitor present  OT Visit Diagnosis: Muscle weakness (generalized) (M62.81);Other abnormalities of gait and  mobility (R26.89);Unsteadiness on feet (R26.81)                Time: 1340-1400 OT Time Calculation (min): 20 min Charges:     Butch Penny, SOT

## 2023-08-11 NOTE — Progress Notes (Signed)
   08/11/23 1554  Assess: MEWS Score  Temp (!) 100.6 F (38.1 C)  BP (!) 142/78  MAP (mmHg) 97  Level of Consciousness Alert  SpO2 97 %  O2 Device Room Air  Assess: MEWS Score  MEWS Temp 1  MEWS Systolic 0  MEWS Pulse 1  MEWS RR 0  MEWS LOC 0  MEWS Score 2  MEWS Score Color Yellow  Assess: if the MEWS score is Yellow or Red  Were vital signs accurate and taken at a resting state? Yes  Does the patient meet 2 or more of the SIRS criteria? Yes  Does the patient have a confirmed or suspected source of infection? Yes  MEWS guidelines implemented  Yes, yellow  Treat  MEWS Interventions Considered administering scheduled or prn medications/treatments as ordered  Take Vital Signs  Increase Vital Sign Frequency  Yellow: Q2hr x1, continue Q4hrs until patient remains green for 12hrs  Escalate  MEWS: Escalate Yellow: Discuss with charge nurse and consider notifying provider and/or RRT  Notify: Charge Nurse/RN  Name of Charge Nurse/RN Notified Maren Reamer  Provider Notification  Provider Name/Title Dr Denton Lank  Date Provider Notified 08/11/23  Time Provider Notified 1607  Method of Notification  (text)  Notification Reason Change in status (HR elevated)  Provider response No new orders  Date of Provider Response 08/11/23  Time of Provider Response 1612  Assess: SIRS CRITERIA  SIRS Temperature  0  SIRS Pulse 1  SIRS Respirations  0  SIRS WBC 0  SIRS Score Sum  1

## 2023-08-11 NOTE — Evaluation (Signed)
Physical Therapy Evaluation Patient Details Name: Jocelyn Hooper MRN: [id number] DOB: 1941-07-31 Today's Date: 08/11/2023  History of Present Illness  Pt is an 82 yo F presenting due to AMS, with MRSA, sepsis, acute urinary retention, acute metabolic encephalopathy, arteriosclerotic CVD, and hypokalemia. PMH significant for L5-S1 TLIF (06/09/2023), anemia, L shoulder bursitis, CAD, CSF leak, IBS, Scoliosis, Trigeminal neuralgia, and B TKR.  Clinical Impression  Pt was pleasant but confused and willing to participate during the session and put forth good effort. Pt presented A,O to name/DOB, but unable to answer questions related to history - receiving information from pt's family in room, and difficulty following commands throughout. Pt was able to perform bed mobility with heavy +2 assist for safety, contributing very little active movement. Upon sitting EOB, pt HR was recorded at max of 137 bpm (RN notified), pt exhibiting discomfort in this position with quickened RR. Further mobility deferred 2/2 elevated HR and RR. Performed supine>sit and adjustments in bed with +2 max assist for pt safety. Pt's vitals were monitored throughout session, no other adverse symptoms noted. Pt will benefit from continued PT services upon discharge to safely address deficits listed in patient problem list for decreased caregiver assistance and eventual return to PLOF.       If plan is discharge home, recommend the following: Assistance with cooking/housework;Help with stairs or ramp for entrance;Supervision due to cognitive status;Assist for transportation;Direct supervision/assist for financial management;A lot of help with walking and/or transfers;A lot of help with bathing/dressing/bathroom   Can travel by private vehicle   No    Equipment Recommendations   Recommendations for Other Services       Functional Status Assessment Patient has had a recent decline in their functional status and demonstrates the  ability to make significant improvements in function in a reasonable and predictable amount of time.     Precautions / Restrictions Precautions Precautions: Fall Restrictions Weight Bearing Restrictions: No      Mobility  Bed Mobility Overal bed mobility: Needs Assistance Bed Mobility: Rolling, Sidelying to Sit, Sit to Supine Rolling: Max assist, Used rails Sidelying to sit: Max assist, +2 for physical assistance, HOB elevated   Sit to supine: Max assist, +2 for physical assistance   General bed mobility comments: pt exhibiting difficulty due to lack of understanding throughout, able to initiate small movements but req assist at both BLE and trunk to complete, some use of BUEs at rail and bedside seated EOB - though needing light assist to maintain upright; opted for sit>supine and rolling with +2 max A to change chuck pad at end of session given pt increasing HR    Transfers                   General transfer comment: declined 2/2 pt HR and confusion    Ambulation/Gait                  Stairs            Wheelchair Mobility     Tilt Bed    Modified Rankin (Stroke Patients Only)       Balance Overall balance assessment: Needs assistance Sitting-balance support: Feet supported, Bilateral upper extremity supported Sitting balance-Leahy Scale: Fair Sitting balance - Comments: req assist to maintain static sitting EOB, difficult to assess 2/2 pt cognition       Standing balance comment: n/a  Pertinent Vitals/Pain Pain Assessment Pain Assessment: Faces Faces Pain Scale: Hurts little more Pain Location: L hip Pain Descriptors / Indicators: Grimacing, Discomfort Pain Intervention(s): Monitored during session, Repositioned    Home Living Family/patient expects to be discharged to:: Private residence Living Arrangements: Spouse/significant other;Other relatives Available Help at Discharge:  Family;Available 24 hours/day Type of Home: House Home Access: Stairs to enter Entrance Stairs-Rails: Left Entrance Stairs-Number of Steps: 3   Home Layout: One level;Laundry or work area in basement;Able to live on main level with bedroom/bathroom Home Equipment: Agricultural consultant (2 wheels);Cane - single point;Adaptive equipment;Shower seat;BSC/3in1 Additional Comments: obtained per combination of chart review and family report    Prior Function Prior Level of Function : Independent/Modified Independent;History of Falls (last six months)             Mobility Comments: per spouse, pt was using RW prn but most recently improved to no ADs with household distances; does report 2 falls in last 6 mo, most recent ~2 mo ago due to slipping out of shower ADLs Comments: per spouse, she is most recently mod I after recovering from surgery in September. He assists with ADLs prn such as by taking laundry out of the washer/dryer for her. Family reports she is typically very independent, even driving short distances and walking up and down steps a week ago     Extremity/Trunk Assessment   Upper Extremity Assessment Upper Extremity Assessment: Overall WFL for tasks assessed;Generalized weakness;Difficult to assess due to impaired cognition    Lower Extremity Assessment Lower Extremity Assessment: Generalized weakness;Difficult to assess due to impaired cognition       Communication   Communication Communication: No apparent difficulties Cueing Techniques: Gestural cues;Verbal cues;Tactile cues;Visual cues  Cognition Arousal: Alert Behavior During Therapy: WFL for tasks assessed/performed Overall Cognitive Status: Impaired/Different from baseline Area of Impairment: Orientation, Attention, Following commands                 Orientation Level: Disoriented to, Situation     Following Commands: Follows one step commands inconsistently       General Comments: Pt confused, vocalizing  concerns unrelated to activities during session, and rarely exhbiting comprehension of commands. Family states that pt is not at baseline, typically cognitively appropriate        General Comments      Exercises     Assessment/Plan    PT Assessment Patient needs continued PT services  PT Problem List Decreased strength;Decreased range of motion;Decreased activity tolerance;Decreased balance;Decreased knowledge of use of DME;Decreased cognition;Decreased coordination;Decreased mobility;Decreased safety awareness;Cardiopulmonary status limiting activity;Pain       PT Treatment Interventions DME instruction;Gait training;Stair training;Functional mobility training;Therapeutic activities;Therapeutic exercise;Balance training;Patient/family education    PT Goals (Current goals can be found in the Care Plan section)  Acute Rehab PT Goals Patient Stated Goal: get back to walking PT Goal Formulation: With family Time For Goal Achievement: 08/24/23 Potential to Achieve Goals: Good    Frequency Min 1X/week     Co-evaluation               AM-PAC PT "6 Clicks" Mobility  Outcome Measure Help needed turning from your back to your side while in a flat bed without using bedrails?: A Little Help needed moving from lying on your back to sitting on the side of a flat bed without using bedrails?: A Lot Help needed moving to and from a bed to a chair (including a wheelchair)?: A Lot Help needed standing up from a chair using  your arms (e.g., wheelchair or bedside chair)?: A Lot Help needed to walk in hospital room?: A Lot Help needed climbing 3-5 steps with a railing? : A Lot 6 Click Score: 13    End of Session Equipment Utilized During Treatment: Gait belt Activity Tolerance: Other (comment) (limited per increasing HR) Patient left: in bed;with call bell/phone within reach;with bed alarm set Nurse Communication: Mobility status (max HR of 137 bpm) PT Visit Diagnosis: Difficulty in  walking, not elsewhere classified (R26.2);Muscle weakness (generalized) (M62.81);Pain Pain - Right/Left: Left Pain - part of body: Hip    Time: 9147-8295 PT Time Calculation (min) (ACUTE ONLY): 32 min   Charges:   PT Evaluation $PT Eval Moderate Complexity: 1 Mod PT Treatments $Therapeutic Activity: 8-22 mins PT General Charges $$ ACUTE PT VISIT: 1 Visit       Rosiland Oz SPT 08/11/23, 4:34 PM

## 2023-08-11 NOTE — Plan of Care (Signed)

## 2023-08-11 NOTE — Progress Notes (Signed)
Transesophageal Echocardiogram :  Indication: bacteremia, staph, r/o endocarditis Requesting/ordering  physician: Denton Lank  Procedure: Benzocaine spray x2 and 2 mls x 2 of viscous lidocaine were given orally to provide local anesthesia to the oropharynx. The patient was positioned supine on the left side, bite block provided. The patient was moderately sedated with the doses of versed and fentanyl as detailed below.  Using digital technique an omniplane probe was advanced into the distal esophagus without incident.   Moderate sedation: 1. Sedation used:  Versed: 3 mg IV, Fentanyl: 75ug IV 2. Time administered:  7:55 Am   Time when patient started recovery:8:25 Am Total sedation time 30 min 3. I was face to face during this time  See report in EPIC  for complete details: In brief,  No valve vegetation transgastric imaging revealed normal LV function with no RWMAs and no mural apical thrombus.  .  Estimated ejection fraction was 55%.  Right sided cardiac chambers were normal with no evidence of pulmonary hypertension.  Imaging of the septum showed no ASD or VSD Bubble study was negative for shunt 2D and color flow confirmed no PFO  The LA was well visualized in orthogonal views.  There was no spontaneous contrast and no thrombus in the LA and LA appendage   The descending thoracic aorta had no  mural aortic debris with no evidence of aneurysmal dilation or disection   Jocelyn Hooper 08/11/2023 8:25 AM

## 2023-08-11 NOTE — Progress Notes (Signed)
Progress Note   Patient: Jocelyn Hooper AVW:[id number] DOB: [birth year] DOA: 08/08/2023     3 DOS: the patient was seen and examined on 08/11/2023   Brief hospital course:  Jocelyn Hooper is a 82 y.o. female with medical history significant of iron deficiency anemia, B12 deficiency, arteriosclerotic disease, nephrolithiasis, scoliosis s/p recent intervention (06/09/2023), who presents to the ED due to altered mental status.   History obtained from both patient and her family at bedside.  Mrs. Soileau states that she experiencing worsening of her left hip pain and has been told in the past she has bursitis.  Her husband at bedside states that yesterday, she climbed the stairs numerous times in preparation for the holidays.  Mrs. Dial also endorses bladder fullness and feels as though she is unable to urinate.  She denies any dysuria, frequency, urgency or abdominal pain though.  She denies any cough, shortness of breath, chest pain, palpitations, lower extremity swelling.  Mr. Lallo states that yesterday, she was in her usual state of health but upon awakening today, he noted that she was more confused and had a fever.  He notes that she has frequent UTIs.   [name] at bedside states that patient has had occasional memory issues and repeats herself multiple times but it seems much worse today.   ED course: On arrival to the ED, patient was hypertensive at 139/85 with heart rate of 117.  She was afebrile at 99.4 but subsequently developed a fever of 101.5.  She was saturating at 99% on room air.  Initial workup notable for WBC of 12.6, sodium of 133, bicarb of 18, creatinine 0.75 with GFR above 60.  Lactic acid within normal limits.  COVID-19 negative.  Urinalysis with ketonuria and rare bacteria.  CT of the abdomen was obtained and results pending.  Chest x-ray was obtained with no active disease.  Patient was started on IV fluids and Rocephin.  TRH contacted for admission.    Further hospital  course and management as outlined below.   Assessment and Plan:  Sepsis MRSA bacteremia As evidenced by fever with a Tmax of 101.9, tachycardia, tachypnea, marked leukocytosis with a white count of 18,000 Patient continues to spike fever with Tmax of 103.  Will place patient on scheduled acetaminophen.  Repeat blood cultures Blood cultures yield gram-positive cocci, BC ID positive for MRSA Unclear source, patient with complaints of severe left hip pain and is status post recent L5-S1 decompression, instrumentation and fusion on the patient on 06/09/2023.  Appreciate ID input Continue empiric antibiotic therapy with IV vancomycin MRI of the lumbar spine shows status post L5-S1 spinal fusion with a superior endplate compression deformity of the L5 level that is unchanged from 08/08/2023. Eccentric left disc bulges at L4-L5 and L5-S1 with obliteration of the left lateral recesses and severe left neural foraminal narrowing at these levels. Findings have progressed compared to lumbar spine MRI dated 01/11/2023. MRI of the left hip shows  mild osteoarthritis of the left hip.  No acute findings. Small bilateral hip joint effusions. Mild tendinosis of the bilateral gluteus medius and minimus tendons. Mild right peritrochanteric bursitis. Mild tendinosis of the bilateral hamstring tendon origins.      Patient is scheduled for TEE today to rule out active endocarditis   Acute urinary retention Significant bladder distention noted on CT imaging with patient's inability to urinate afterwards.   In-N-Out catheter with 600 cc.  Unclear etiology Foley catheter placed 11/26 --D/C Foley for voiding trial 24-48  hours before discharge  Acute metabolic encephalopathy Secondary to sepsis.  Improved back to baseline Patient's family on admission was concerned that patient was not acting herself and was frequently repeating statements.      ASCVD (arteriosclerotic cardiovascular disease) Continue home  aspirin and metoprolol    Hypokalemia Supplement potassium             Subjective: Pt resting in bed, family at bedside, ID physician in examining pt and discussing plan.  Pt reports left hip and low back pain, requesting medication. Denies other acute complaints.   Physical Exam: Vitals:   08/11/23 0845 08/11/23 0900 08/11/23 0922 08/11/23 1231  BP: 115/60 115/60 (!) 109/59 (!) 125/57  Pulse: 98 94 91 (!) 101  Resp: 20 (!) 23 16   Temp:   98.4 F (36.9 C) 100.2 F (37.9 C)  TempSrc:      SpO2: 96% 96% 96% 97%  Weight:      Height:       General exam: awake, appears drowsy, no acute distress HEENT: clear conjunctiva, anicteric sclera, moist mucus membranes, hard of hearing  Respiratory system: CTAB, no wheezes, rales or rhonchi, normal respiratory effort. Cardiovascular system: normal S1/S2, RRR, no pedal edema.   Gastrointestinal system: soft, NT, ND Central nervous system: A&O x. no gross focal neurologic deficits, normal speech Extremities: moves all, no edema, normal tone Skin: midline low back incision without significant surround erythema, no drainage, scab intact, skin with warm and dry but no areas of differential warmth Psychiatry: normal mood, flat affect       Data Reviewed: Labs reviewed & notable for --  WBC 18.9 >> 18.2, Hbg 12.1 >> 10.5, platelets 139k  BMP yesterday K 3.4, bicarb 15, Ca 8.7   Family Communication: Plan of care was discussed with patient and her family at the bedside.  They verbalized understanding and agree with the plan  Disposition: Status is: Inpatient Remains inpatient appropriate because: Treatment of MRSA bacteremia, ongoing evaluation   Planned Discharge Destination:  TBD    Time spent: 45 minutes  Author: Pennie Banter, DO 08/11/2023 2:55 PM  For on call review [email or web address].

## 2023-08-11 NOTE — TOC Progression Note (Signed)
Transition of Care [facility]) - Progression Note    Patient Details  Name: Jocelyn Hooper MRN: [id number] Date of Birth: [birth year]  [facility] of [facility]) CM/SW Contact  Allena Katz, LCSW Phone Number: 08/11/2023, 2:56 PM  Clinical Narrative:   ID reporting pt will need 4-6 weeks of antibiotics. Pt to get PICC on Friday.     Expected Discharge Plan: Home w Home Health Services Barriers to Discharge: Continued Medical Work up  Expected Discharge Plan and Services                                               Social Determinants of Health (SDOH) Interventions SDOH Screenings   Food Insecurity: No Food Insecurity (08/09/2023)  Housing: Low Risk  (08/09/2023)  Transportation Needs: No Transportation Needs (08/09/2023)  Utilities: Not At Risk (08/09/2023)  Financial Resource Strain: Low Risk  (07/01/2023)   Received from [facility]  Tobacco Use: Low Risk  (08/08/2023)    Readmission Risk Interventions     No data to display

## 2023-08-12 ENCOUNTER — Inpatient Hospital Stay: Payer: PPO

## 2023-08-12 ENCOUNTER — Inpatient Hospital Stay
Admit: 2023-08-12 | Discharge: 2023-08-12 | Disposition: A | Discharge status: Home / Self Care | Payer: PPO | Attending: Internal Medicine | Admitting: Internal Medicine

## 2023-08-12 DIAGNOSIS — R6510 Systemic inflammatory response syndrome (SIRS) of non-infectious origin without acute organ dysfunction: Secondary | ICD-10-CM | POA: Diagnosis not present

## 2023-08-12 DIAGNOSIS — R338 Other retention of urine: Secondary | ICD-10-CM | POA: Diagnosis not present

## 2023-08-12 DIAGNOSIS — R7881 Bacteremia: Secondary | ICD-10-CM | POA: Diagnosis not present

## 2023-08-12 DIAGNOSIS — R509 Fever, unspecified: Secondary | ICD-10-CM | POA: Diagnosis not present

## 2023-08-12 DIAGNOSIS — A4102 Sepsis due to Methicillin resistant Staphylococcus aureus: Secondary | ICD-10-CM | POA: Diagnosis not present

## 2023-08-12 DIAGNOSIS — R6520 Severe sepsis without septic shock: Secondary | ICD-10-CM

## 2023-08-12 DIAGNOSIS — R9389 Abnormal findings on diagnostic imaging of other specified body structures: Secondary | ICD-10-CM | POA: Diagnosis not present

## 2023-08-12 DIAGNOSIS — G9341 Metabolic encephalopathy: Secondary | ICD-10-CM | POA: Diagnosis not present

## 2023-08-12 DIAGNOSIS — I700 Atherosclerosis of aorta: Secondary | ICD-10-CM | POA: Diagnosis not present

## 2023-08-12 DIAGNOSIS — I77819 Aortic ectasia, unspecified site: Secondary | ICD-10-CM | POA: Diagnosis not present

## 2023-08-12 LAB — HEPATIC FUNCTION PANEL
ALT: 17 U/L (ref 0–44)
AST: 20 U/L (ref 15–41)
Albumin: 2.5 g/dL — ABNORMAL LOW (ref 3.5–5.0)
Alkaline Phosphatase: 122 U/L (ref 38–126)
Bilirubin, Direct: 0.2 mg/dL (ref 0.0–0.2)
Indirect Bilirubin: 0.4 mg/dL (ref 0.3–0.9)
Total Bilirubin: 0.6 mg/dL (ref <1.2)
Total Protein: 6.2 g/dL — ABNORMAL LOW (ref 6.5–8.1)

## 2023-08-12 LAB — BASIC METABOLIC PANEL
Anion gap: 7 (ref 5–15)
Anion gap: 7 (ref 5–15)
BUN: 17 mg/dL (ref 8–23)
BUN: 18 mg/dL (ref 8–23)
CO2: 16 mmol/L — ABNORMAL LOW (ref 22–32)
CO2: 18 mmol/L — ABNORMAL LOW (ref 22–32)
Calcium: 8.1 mg/dL — ABNORMAL LOW (ref 8.9–10.3)
Calcium: 8.6 mg/dL — ABNORMAL LOW (ref 8.9–10.3)
Chloride: 107 mmol/L (ref 98–111)
Chloride: 109 mmol/L (ref 98–111)
Creatinine, Ser: 0.61 mg/dL (ref 0.44–1.00)
Creatinine, Ser: 0.64 mg/dL (ref 0.44–1.00)
GFR, Estimated: >60 mL/min (ref >60)
GFR, Estimated: >60 mL/min (ref >60)
Glucose, Bld: 124 mg/dL — ABNORMAL HIGH (ref 70–99)
Glucose, Bld: 136 mg/dL — ABNORMAL HIGH (ref 70–99)
Potassium: 2.9 mmol/L — ABNORMAL LOW (ref 3.5–5.1)
Potassium: 4.1 mmol/L (ref 3.5–5.1)
Sodium: 132 mmol/L — ABNORMAL LOW (ref 135–145)
Sodium: 132 mmol/L — ABNORMAL LOW (ref 135–145)

## 2023-08-12 LAB — ECHOCARDIOGRAM COMPLETE
AR max vel: 2.32 cm2
AV Area VTI: 2.18 cm2
AV Area mean vel: 2.29 cm2
AV Mean grad: 3 mm[Hg]
AV Peak grad: 5.3 mm[Hg]
Ao pk vel: 1.15 m/s
Area-P 1/2: 5.13 cm2
Height: 65 in
MV VTI: 2.34 cm2
Weight: 1975.32 [oz_av]

## 2023-08-12 LAB — CBC WITH DIFFERENTIAL/PLATELET
Abs Immature Granulocytes: 0.08 10*3/uL — ABNORMAL HIGH (ref 0.00–0.07)
Abs Immature Granulocytes: 0.1 10*3/uL — ABNORMAL HIGH (ref 0.00–0.07)
Basophils Absolute: 0 10*3/uL (ref 0.0–0.1)
Basophils Absolute: 0 10*3/uL (ref 0.0–0.1)
Basophils Relative: 0 %
Basophils Relative: 0 %
Eosinophils Absolute: 0 10*3/uL (ref 0.0–0.5)
Eosinophils Absolute: 0 10*3/uL (ref 0.0–0.5)
Eosinophils Relative: 0 %
Eosinophils Relative: 0 %
HCT: 28.1 % — ABNORMAL LOW (ref 36.0–46.0)
HCT: 31.6 % — ABNORMAL LOW (ref 36.0–46.0)
Hemoglobin: 10.6 g/dL — ABNORMAL LOW (ref 12.0–15.0)
Hemoglobin: 9.5 g/dL — ABNORMAL LOW (ref 12.0–15.0)
Immature Granulocytes: 1 %
Immature Granulocytes: 1 %
Lymphocytes Relative: 7 %
Lymphocytes Relative: 7 %
Lymphs Abs: 1 10*3/uL (ref 0.7–4.0)
Lymphs Abs: 1 10*3/uL (ref 0.7–4.0)
MCH: 31 pg (ref 26.0–34.0)
MCH: 31.5 pg (ref 26.0–34.0)
MCHC: 33.5 g/dL (ref 30.0–36.0)
MCHC: 33.8 g/dL (ref 30.0–36.0)
MCV: 91.8 fL (ref 80.0–100.0)
MCV: 93.8 fL (ref 80.0–100.0)
Monocytes Absolute: 1.2 10*3/uL — ABNORMAL HIGH (ref 0.1–1.0)
Monocytes Absolute: 1.2 10*3/uL — ABNORMAL HIGH (ref 0.1–1.0)
Monocytes Relative: 8 %
Monocytes Relative: 9 %
Neutro Abs: 11.4 10*3/uL — ABNORMAL HIGH (ref 1.7–7.7)
Neutro Abs: 12.9 10*3/uL — ABNORMAL HIGH (ref 1.7–7.7)
Neutrophils Relative %: 83 %
Neutrophils Relative %: 84 %
Platelets: 151 10*3/uL (ref 150–400)
Platelets: 187 10*3/uL (ref 150–400)
RBC: 3.06 MIL/uL — ABNORMAL LOW (ref 3.87–5.11)
RBC: 3.37 MIL/uL — ABNORMAL LOW (ref 3.87–5.11)
RDW: 14.7 % (ref 11.5–15.5)
RDW: 14.8 % (ref 11.5–15.5)
WBC: 13.7 10*3/uL — ABNORMAL HIGH (ref 4.0–10.5)
WBC: 15.3 10*3/uL — ABNORMAL HIGH (ref 4.0–10.5)
nRBC: 0 % (ref 0.0–0.2)
nRBC: 0 % (ref 0.0–0.2)

## 2023-08-12 LAB — GLUCOSE, CAPILLARY: Glucose-Capillary: 114 mg/dL — ABNORMAL HIGH (ref 70–99)

## 2023-08-12 LAB — VANCOMYCIN, PEAK: Vancomycin Pk: 16 ug/mL — ABNORMAL LOW (ref 30–40)

## 2023-08-12 LAB — VANCOMYCIN, TROUGH: Vancomycin Tr: 8 ug/mL — ABNORMAL LOW (ref 15–20)

## 2023-08-12 LAB — LACTIC ACID, PLASMA
Lactic Acid, Venous: 0.8 mmol/L (ref 0.5–1.9)
Lactic Acid, Venous: 0.9 mmol/L (ref 0.5–1.9)

## 2023-08-12 LAB — BLOOD GAS, VENOUS
Acid-base deficit: 5 mmol/L — ABNORMAL HIGH (ref 0.0–2.0)
Bicarbonate: 16.7 mmol/L — ABNORMAL LOW (ref 20.0–28.0)
O2 Saturation: 94.8 %
Patient temperature: 37
pCO2, Ven: 23 mm[Hg] — ABNORMAL LOW (ref 44–60)
pH, Ven: 7.47 — ABNORMAL HIGH (ref 7.25–7.43)
pO2, Ven: 60 mm[Hg] — ABNORMAL HIGH (ref 32–45)

## 2023-08-12 LAB — URIC ACID: Uric Acid, Serum: 2.1 mg/dL — ABNORMAL LOW (ref 2.5–7.1)

## 2023-08-12 LAB — MAGNESIUM
Magnesium: 2 mg/dL (ref 1.7–2.4)
Magnesium: 2.1 mg/dL (ref 1.7–2.4)

## 2023-08-12 LAB — ECHO TEE

## 2023-08-12 LAB — SEDIMENTATION RATE: Sed Rate: 107 mm/h — ABNORMAL HIGH (ref 0–30)

## 2023-08-12 LAB — PHOSPHORUS: Phosphorus: 1 mg/dL — CL (ref 2.5–4.6)

## 2023-08-12 LAB — C-REACTIVE PROTEIN: CRP: 26.3 mg/dL — ABNORMAL HIGH (ref <1.0)

## 2023-08-12 MED ORDER — POTASSIUM PHOSPHATES 15 MMOLE/5ML IV SOLN
30.0000 mmol | Freq: Once | INTRAVENOUS | Status: DC
  Filled 2023-08-12: qty 10

## 2023-08-12 MED ORDER — POTASSIUM CHLORIDE 10 MEQ/100ML IV SOLN
10.0000 meq | Freq: Once | INTRAVENOUS | Status: AC
  Administered 2023-08-12: 10 meq via INTRAVENOUS
  Filled 2023-08-12: qty 100

## 2023-08-12 MED ORDER — IBUPROFEN 400 MG PO TABS
600.0000 mg | ORAL_TABLET | Freq: Once | ORAL | Status: AC
  Administered 2023-08-12: 600 mg via ORAL
  Filled 2023-08-12: qty 2

## 2023-08-12 MED ORDER — VANCOMYCIN HCL 1250 MG/250ML IV SOLN
1250.0000 mg | INTRAVENOUS | Status: DC
  Administered 2023-08-12 – 2023-08-15 (×4): 1250 mg via INTRAVENOUS
  Filled 2023-08-12 (×4): qty 250

## 2023-08-12 MED ORDER — SODIUM CHLORIDE 0.9 % IV BOLUS
250.0000 mL | Freq: Once | INTRAVENOUS | Status: AC
  Administered 2023-08-12: 250 mL via INTRAVENOUS

## 2023-08-12 MED ORDER — POTASSIUM CHLORIDE CRYS ER 20 MEQ PO TBCR
40.0000 meq | EXTENDED_RELEASE_TABLET | Freq: Once | ORAL | Status: AC
  Administered 2023-08-12: 40 meq via ORAL
  Filled 2023-08-12: qty 2

## 2023-08-12 MED ORDER — VANCOMYCIN HCL 500 MG/100ML IV SOLN
500.0000 mg | Freq: Two times a day (BID) | INTRAVENOUS | Status: DC
  Filled 2023-08-12: qty 100

## 2023-08-12 MED ORDER — SODIUM PHOSPHATES 45 MMOLE/15ML IV SOLN
30.0000 mmol | Freq: Once | INTRAVENOUS | Status: AC
  Administered 2023-08-12: 30 mmol via INTRAVENOUS
  Filled 2023-08-12: qty 10

## 2023-08-12 MED ORDER — PANTOPRAZOLE SODIUM 40 MG IV SOLR
40.0000 mg | INTRAVENOUS | Status: DC
  Administered 2023-08-12 – 2023-08-14 (×3): 40 mg via INTRAVENOUS
  Filled 2023-08-12 (×3): qty 10

## 2023-08-12 NOTE — Progress Notes (Signed)
Pharmacy Antibiotic Note  Jocelyn Hooper is a 82 y.o. female admitted on 08/08/2023 with bacteremia.  Pharmacy has been consulted for Vancomycin dosing. Recently lumbar fusion in sept.  Treated for UTI in October (no cultures available, only UAs).    Today, 08/12/2023 Day # 4 vancomycin Renal: SCr 0.61 (improved) WBC 15.3 Tm 100.4 11/24 blood cx: MRSA--> repeat blood cx in process Hip pain - Mild osteoarthritis of the left hip. No acute findings; Small bilateral hip joint effusions; Mild tendinosis of the bilateral gluteus medius and minimus tendons. Mild right peritrochanteric bursitis; Mild tendinosis of the bilateral hamstring tendon origins   Assessment: Current regimen: Vancomycin 750 mg IV Q24H Preceding dose 11/27 @ 2118  Vanc pk 11/28 @ 0022: 16 ug/mL  Vanc tr 11/28 @ 1943: 8 ug/mL PK Calculations AUC: 282.4 Cmin: 7.6 T 1/2: 19.4hr  Plan: Adjust vancomycin to 1250mg  IV q24h (next dose due 2200 tonight) Expected AUC: 470.5 Expected Css min: 12.7 Monitor renal function Check vancomycin level at steady state Await repeat blood cultures, ECHO, etc.  ID following  Height: 5\' 5"  (165.1 cm) Weight: 56 kg (123 lb 7.3 oz) IBW/kg (Calculated) : 57  Temp (24hrs), Avg:98.6 F (37 C), Min:97.8 F (36.6 C), Max:100.4 F (38 C)  Recent Labs  Lab 08/08/23 1048 08/09/23 0744 08/09/23 1754 08/09/23 2000 08/10/23 0529 08/10/23 0818 08/11/23 0437 08/12/23 0022 08/12/23 1943  WBC 12.6* 18.9*  --   --   --  18.2*  --  15.3*  --   CREATININE 0.75 0.88  --   --  0.75 0.75 0.73 0.61  --   LATICACIDVEN 1.0  --  2.8* 1.3  --   --   --  0.9  --   VANCOTROUGH  --   --   --   --   --   --   --   --  8*  VANCOPEAK  --   --   --   --   --   --   --  16*  --     Estimated Creatinine Clearance: 46.3 mL/min (by C-G formula based on SCr of 0.61 mg/dL).    Allergies  Allergen Reactions   Avelox [Moxifloxacin Hcl In Nacl] Other (See Comments)    Redness all over in about 5 minutes after  taking   Codeine Nausea Only   Acyclovir And Related     Turn red all over    Shrimp [Shellfish Allergy]     Hives     Ciprofloxacin Rash   Moxifloxacin Hives and Rash   Sucralfate Rash    Itching     Antimicrobials this admission: 11/27 ceftriaxone X 1 11/25 vancomycin >>  Dose adjustments this admission:   Microbiology results:  11/24 BCx: 4/4 GPC, BCID MRSA   11/24 UCx:  Multiple species present, suggest re-collection  11/26 Bcx: IP   Thank you for allowing pharmacy to be a part of this patient's care.  Paulita Fujita, PharmD Clinical Pharmacist 08/12/2023 8:27 PM

## 2023-08-12 NOTE — Progress Notes (Signed)
CROSS COVER NOTE  NAME: Jocelyn Hooper MRN: [id number] DOB : [birth year]    Concern as stated by nurse / staff   Message received via secure chat from [place] RN  Hi Jocelyn Hooper is an 82year old that came in with sepsis (unclear source maybe lower back incision which is growing MRSA) she is currently a yellow mews for elevated HR and hypertension. I got in report that she was Aox4 she is AO x1 and does not look well. Family is concerned. She does not look well please come to the bedside to assess. She's not on fluids and has had decreased PO intake. Currently on IV vancomycin.    Pertinent findings on chart review: 82 year old (PMH ASCVD, mixed hyperlipidemia, GERD, bursitis of left shoulder, CSF leak, DDD, fibrocystic breast disease, kidney stones, IBS, coln polyps, scoliosis, tinnitus, trigeminal neuralgia syndrome, chronic sinusitis, B 12 deficiency, and Vit D deficiency) with sepsis without shock of unknown infection source. BCID MRSA. Recently seen by inernal medicine 11/18 started on gabapentin and ibuprofen for presumed sciatica causing her left hip pain. Also started on carafate in addition to her daily PPI for acute gastritis symptoms.  She is on day 4 of Vanc. TEE negative for vegetation; left hip pain complaint on admission and ongoing. She underwent L5-S1 decompression, instrumentation and fusion on 06/09/2023.  Per chart review patient with bursitis in left hip causing the pain and gets injections. MRI of the lumbar spine shows status post L5-S1 spinal fusion with a superior endplate compression deformity of the L5 level that is unchanged from 08/08/2023. Eccentric left disc bulges at L4-L5 and L5-S1 with obliteration of the left lateral recesses and severe left neural foraminal narrowing at these levels. Findings have progressed compared to lumbar spine MRI dated 01/11/2023.  MRI of the left hip shows  mild osteoarthritis of the left hip.  No acute findings. Small bilateral hip  joint effusions. Mild tendinosis of the bilateral gluteus medius and minimus tendons. Mild right peritrochanteric bursitis. Mild tendinosis of the bilateral hamstring tendon origins.        Husband at bedside states patient has not wanted to eat drink or move a lot today and she has been weak, not as engaged and she has been known to be like this when she has a fever. Review of vitals shows worsening tachycardia with stable BP and normal oral temps throughout day. She is on daily metoprolol and did receive med this am acc to [facility]  Assessment and  Interventions   Assessment:    08/12/2023    7:15 PM 08/12/2023    3:27 PM 08/12/2023   11:46 AM  Vitals with BMI  Systolic 142 141 469  Diastolic 76 72 65  Pulse 117 109 94   Rectal temp requested at bedside 103.4 F Patient alert responds slowly to question. Generalized very weak. Notable protein calorie/ albumin  deficit with skin turgor and edema especially left arm.  Respirations shallow and high rate 24.  Crackles half way up on in right lung and left base.  Chest xray reviewed by me and agree with radiologist report FINDINGS: 9:10 p.m. Cardiac size is normal. Stable mediastinum with tortuous and ectatic aorta with atherosclerosis.   The visualized lungs are clear. The right lower lung field is obscured by the chronically elevated right hemidiaphragm.   There is no substantial pleural effusion. Thoracic cage is intact. Overlying monitor wires.   IMPRESSION: No evidence of acute chest disease,  but there is a chronically elevated right hemidiaphragm with limited view of the right lower lung field.   Aortic atherosclerosis and uncoiling. Additional ordered labs  Vbg 7.47 - 23 - 60 - 16.7 respiratory alkalosis likely from overcompensating metabolic acidosis    Plan: Rectal acetaminophen given while at bedside Fluid bolus  With improvement in fever heart rate normalizing nursing still concern patient in need of higher level of  care Transferred to progressive unit      Donnie Mesa NP [facility] Cross Cover 7pm-7am - check amion for availability Pager [phone]

## 2023-08-12 NOTE — Progress Notes (Addendum)
Initial Yellow MEWS documentation for this RN's shift.

## 2023-08-12 NOTE — Progress Notes (Signed)
  Echocardiogram 2D Echocardiogram has been performed.  Jocelyn Hooper Jocelyn Hooper 08/12/2023, 12:09 PM

## 2023-08-12 NOTE — Progress Notes (Signed)
Progress Note   Patient: Jocelyn Hooper XLK:[id number] DOB: [birth year] DOA: 08/08/2023     4 DOS: the patient was seen and examined on 08/12/2023   Brief hospital course:  PABLO FERKO is a 82 y.o. female with medical history significant of iron deficiency anemia, B12 deficiency, arteriosclerotic disease, nephrolithiasis, scoliosis s/p recent intervention (06/09/2023), who presents to the ED due to altered mental status.   History obtained from both patient and her family at bedside.  Mrs. Deere states that she experiencing worsening of her left hip pain and has been told in the past she has bursitis.  Her husband at bedside states that yesterday, she climbed the stairs numerous times in preparation for the holidays.  Mrs. Clegg also endorses bladder fullness and feels as though she is unable to urinate.  She denies any dysuria, frequency, urgency or abdominal pain though.  She denies any cough, shortness of breath, chest pain, palpitations, lower extremity swelling.  Mr. Aguillon states that yesterday, she was in her usual state of health but upon awakening today, he noted that she was more confused and had a fever.  He notes that she has frequent UTIs.   [name] at bedside states that patient has had occasional memory issues and repeats herself multiple times but it seems much worse today.   ED course: On arrival to the ED, patient was hypertensive at 139/85 with heart rate of 117.  She was afebrile at 99.4 but subsequently developed a fever of 101.5.  She was saturating at 99% on room air.  Initial workup notable for WBC of 12.6, sodium of 133, bicarb of 18, creatinine 0.75 with GFR above 60.  Lactic acid within normal limits.  COVID-19 negative.  Urinalysis with ketonuria and rare bacteria.  CT of the abdomen was obtained and results pending.  Chest x-ray was obtained with no active disease.  Patient was started on IV fluids and Rocephin.  TRH contacted for admission.    Further hospital  course and management as outlined below.   Assessment and Plan:  Sepsis MRSA bacteremia Sepsis POA as evidenced by fever with a Tmax of 101.9, tachycardia, tachypnea, marked leukocytosis with a white count of 18,000 Blood cultures yield gram-positive cocci, BC ID positive for MRSA Unclear source, patient with complaints of severe left hip pain and is status post recent L5-S1 decompression, instrumentation and fusion on the patient on 06/09/2023.   MRI of the lumbar spine shows status post L5-S1 spinal fusion with a superior endplate compression deformity of the L5 level that is unchanged from 08/08/2023. Eccentric left disc bulges at L4-L5 and L5-S1 with obliteration of the left lateral recesses and severe left neural foraminal narrowing at these levels. Findings have progressed compared to lumbar spine MRI dated 01/11/2023.  MRI of the left hip shows  mild osteoarthritis of the left hip.  No acute findings. Small bilateral hip joint effusions. Mild tendinosis of the bilateral gluteus medius and minimus tendons. Mild right peritrochanteric bursitis. Mild tendinosis of the bilateral hamstring tendon origins.       TEE negative for endocarditis  --ID is following --Repeat blood cultures -- no growth to date -- follow --Continue IV vancomycin --PICC line can be placed 11/29 if repeat Bcx negative    Acute urinary retention Significant bladder distention noted on CT imaging with patient's inability to urinate afterwards.   In-N-Out catheter with 600 cc.  Unclear etiology Foley catheter placed 11/26 --D/C Foley for voiding trial 24-48 hours before discharge  Acute metabolic encephalopathy Secondary to sepsis.  Improved back to baseline Patient's family on admission was concerned that patient was not acting herself and was frequently repeating statements.       ASCVD (arteriosclerotic cardiovascular disease) Continue home aspirin and metoprolol     Hypokalemia Supplement  potassium             Subjective: Pt resting in bed, Echo tech finishing up study. Husband visiting.  Pt reports feeling okay overall, denies acute complaints or fever/chills overnight or this AM.  Last fever around 9 PM last night.    Physical Exam: Vitals:   08/12/23 0053 08/12/23 0453 08/12/23 0718 08/12/23 1146  BP: 121/65 (!) 140/73 135/65 126/65  Pulse: 93 95 100 94  Resp: 20 20    Temp: 98.4 F (36.9 C) 98.8 F (37.1 C) 98.2 F (36.8 C) 98.2 F (36.8 C)  TempSrc: Oral Oral Oral   SpO2: 97% 99% 99% 98%  Weight:      Height:       General exam: awake, alert, no acute distress HEENT: clear conjunctiva, anicteric sclera, moist mucus membranes, hard of hearing  Respiratory system: CTAB, no wheezes, rales or rhonchi, normal respiratory effort. Cardiovascular system: normal S1/S2, RRR, no pedal edema.   Gastrointestinal system: soft, NT, ND Central nervous system: A&O x. no gross focal neurologic deficits, normal speech Extremities: moves all, no edema, normal tone Skin: dry, intact, warm, no rashes seen on visualized skin Psychiatry: normal mood & affect, judgment and insight appear normal       Data Reviewed: Labs reviewed & notable for --  WBC 18.9 >> 18.2 >> 15.3 Hbg 12.1 >> 10.5 >> 9.5 Na 132 K 2.9 Bicarb 18 Ca 8.1  CRP 26.3 / ESR 107 Lactic acid normal 0.9   Procedures -- TEE 11/27 negative for endocarditis, see report. 2D echo 11/28 - EF 55-60%, grade I DD, mild LVH, mild-mod MR   Family Communication: Plan of care was discussed with patient and her husband at the bedside   Disposition: Status is: Inpatient Remains inpatient appropriate because: Treatment of MRSA bacteremia, ongoing evaluation   Planned Discharge Destination:  TBD    Time spent: 36 minutes  Author: Pennie Banter, DO 08/12/2023 1:17 PM  For on call review [email or web address].

## 2023-08-13 ENCOUNTER — Inpatient Hospital Stay: Payer: PPO

## 2023-08-13 DIAGNOSIS — R6520 Severe sepsis without septic shock: Secondary | ICD-10-CM | POA: Diagnosis not present

## 2023-08-13 DIAGNOSIS — G9341 Metabolic encephalopathy: Secondary | ICD-10-CM | POA: Diagnosis not present

## 2023-08-13 DIAGNOSIS — G9340 Encephalopathy, unspecified: Secondary | ICD-10-CM | POA: Diagnosis not present

## 2023-08-13 DIAGNOSIS — M4646 Discitis, unspecified, lumbar region: Secondary | ICD-10-CM | POA: Diagnosis not present

## 2023-08-13 DIAGNOSIS — B9562 Methicillin resistant Staphylococcus aureus infection as the cause of diseases classified elsewhere: Secondary | ICD-10-CM | POA: Diagnosis not present

## 2023-08-13 DIAGNOSIS — R338 Other retention of urine: Secondary | ICD-10-CM | POA: Diagnosis not present

## 2023-08-13 DIAGNOSIS — I6782 Cerebral ischemia: Secondary | ICD-10-CM | POA: Diagnosis not present

## 2023-08-13 DIAGNOSIS — R7881 Bacteremia: Secondary | ICD-10-CM | POA: Diagnosis not present

## 2023-08-13 DIAGNOSIS — A4102 Sepsis due to Methicillin resistant Staphylococcus aureus: Secondary | ICD-10-CM

## 2023-08-13 DIAGNOSIS — R6510 Systemic inflammatory response syndrome (SIRS) of non-infectious origin without acute organ dysfunction: Secondary | ICD-10-CM | POA: Diagnosis not present

## 2023-08-13 DIAGNOSIS — R509 Fever, unspecified: Secondary | ICD-10-CM | POA: Diagnosis not present

## 2023-08-13 LAB — CBC
HCT: 26.4 % — ABNORMAL LOW (ref 36.0–46.0)
Hemoglobin: 8.9 g/dL — ABNORMAL LOW (ref 12.0–15.0)
MCH: 31.8 pg (ref 26.0–34.0)
MCHC: 33.7 g/dL (ref 30.0–36.0)
MCV: 94.3 fL (ref 80.0–100.0)
Platelets: 165 10*3/uL (ref 150–400)
RBC: 2.8 MIL/uL — ABNORMAL LOW (ref 3.87–5.11)
RDW: 14.7 % (ref 11.5–15.5)
WBC: 13.8 10*3/uL — ABNORMAL HIGH (ref 4.0–10.5)
nRBC: 0 % (ref 0.0–0.2)

## 2023-08-13 LAB — BASIC METABOLIC PANEL
Anion gap: 6 (ref 5–15)
BUN: 18 mg/dL (ref 8–23)
CO2: 17 mmol/L — ABNORMAL LOW (ref 22–32)
Calcium: 8 mg/dL — ABNORMAL LOW (ref 8.9–10.3)
Chloride: 111 mmol/L (ref 98–111)
Creatinine, Ser: 0.66 mg/dL (ref 0.44–1.00)
GFR, Estimated: >60 mL/min (ref >60)
Glucose, Bld: 133 mg/dL — ABNORMAL HIGH (ref 70–99)
Potassium: 3.6 mmol/L (ref 3.5–5.1)
Sodium: 134 mmol/L — ABNORMAL LOW (ref 135–145)

## 2023-08-13 LAB — PHOSPHORUS: Phosphorus: 4.5 mg/dL (ref 2.5–4.6)

## 2023-08-13 LAB — VITAMIN D 25 HYDROXY (VIT D DEFICIENCY, FRACTURES): Vit D, 25-Hydroxy: 47.27 ng/mL (ref 30–100)

## 2023-08-13 MED ORDER — SODIUM CHLORIDE 0.9 % IV SOLN
2.0000 g | INTRAVENOUS | Status: DC
  Administered 2023-08-13 – 2023-08-15 (×3): 2 g via INTRAVENOUS
  Filled 2023-08-13 (×3): qty 20

## 2023-08-13 NOTE — Progress Notes (Signed)
Date of Admission:  08/08/2023     ID: Jocelyn Hooper is a 82 y.o. female  Principal Problem:   Sepsis with encephalopathy without septic shock (HCC) Active Problems:   Mixed hyperlipidemia   GERD (gastroesophageal reflux disease)   ASCVD (arteriosclerotic cardiovascular disease)   B12 deficiency   Trigeminal neuralgia syndrome   Spondylolisthesis of lumbosacral region   Acute urinary retention   Altered mental status   Bacteremia  On 06/09/23 underwent L5-S1 decompression with fusion at [facility] B/l TKA ( Left 2013, Rt 2019) Left hip pain- says she has bursitits and gets injection  MRI hip showed bursa in rt peritrochanteric area  Subjective: Pt is feeling better today Had high fever yesterday   Medications:   aspirin EC  81 mg Oral Daily   Chlorhexidine Gluconate Cloth  6 each Topical Daily   enoxaparin (LOVENOX) injection  40 mg Subcutaneous Q24H   feeding supplement  237 mL Oral BID BM   gabapentin  300 mg Oral QHS   loratadine  10 mg Oral Daily   metoprolol succinate  25 mg Oral Daily   pantoprazole (PROTONIX) IV  40 mg Intravenous Q24H   sodium chloride flush  3 mL Intravenous Q12H    Objective: Vital signs in last 24 hours: Patient Vitals for the past 24 hrs:  BP Temp Temp src Pulse Resp SpO2  08/13/23 0859 122/63 98.6 F (37 C) Oral 81 18 96 %  08/13/23 0436 (!) 104/59 (!) 97.3 F (36.3 C) Oral 67 18 99 %  08/13/23 0107 114/66 98.3 F (36.8 C) Oral 85 20 96 %  08/13/23 0035 -- (!) 96.3 F (35.7 C) Rectal -- -- --  08/12/23 2255 -- 100.1 F (37.8 C) Rectal -- -- --  08/12/23 2138 126/64 (!) 102.9 F (39.4 C) Rectal (!) 123 20 97 %  08/12/23 2042 -- (!) 103.5 F (39.7 C) Rectal -- -- --  08/12/23 1915 (!) 142/76 98.1 F (36.7 C) -- (!) 117 -- 98 %  08/12/23 1527 (!) 141/72 97.8 F (36.6 C) -- (!) 109 -- 98 %     LDA Foley   PHYSICAL EXAM:  General: Alert, cooperative, no distress, appears stated age.  Lumbar surgical scar- has a scab Lungs: Clear  to auscultation bilaterally. No Wheezing or Rhonchi. No rales. Heart: Regular rate and rhythm, no murmur, rub or gallop. Abdomen: Soft, non-tender,not distended. Bowel sounds normal. No masses Extremities: atraumatic, no cyanosis. No edema. No clubbing B/l knee surgical scars foley Skin: No rashes or lesions. Or bruising Lymph: Cervical, supraclavicular normal. Neurologic: Grossly non-focal  Lab Results    Latest Ref Rng & Units 08/13/2023    3:43 AM 08/12/2023    8:55 PM 08/12/2023   12:22 AM  CBC  WBC 4.0 - 10.5 K/uL 13.8  13.7  15.3   Hemoglobin 12.0 - 15.0 g/dL 8.9  16.1  9.5   Hematocrit 36.0 - 46.0 % 26.4  31.6  28.1   Platelets 150 - 400 K/uL 165  187  151        Latest Ref Rng & Units 08/13/2023    3:43 AM 08/12/2023    8:55 PM 08/12/2023    7:43 PM  CMP  Glucose 70 - 99 mg/dL 096  045    BUN 8 - 23 mg/dL 18  17    Creatinine 4.09 - 1.00 mg/dL 8.11  9.14    Sodium 782 - 145 mmol/L 134  132    Potassium 3.5 -  5.1 mmol/L 3.6  4.1    Chloride 98 - 111 mmol/L 111  109    CO2 22 - 32 mmol/L 17  16    Calcium 8.9 - 10.3 mg/dL 8.0  8.6    Total Protein 6.5 - 8.1 g/dL   6.2   Total Bilirubin <1.2 mg/dL   0.6   Alkaline Phos 38 - 126 U/L   122   AST 15 - 41 U/L   20   ALT 0 - 44 U/L   17       Microbiology: 08/08/23- 4/4 MRSA bacteremia 08/10/23-BC -   Studies/Results: DG Chest Port 1 View  Result Date: 08/12/2023 CLINICAL DATA:  [id number], [id number] with fever and basal crackles. EXAM: PORTABLE CHEST 1 VIEW COMPARISON:  Portable chest 08/10/2023 FINDINGS: 9:10 p.m. Cardiac size is normal. Stable mediastinum with tortuous and ectatic aorta with atherosclerosis. The visualized lungs are clear. The right lower lung field is obscured by the chronically elevated right hemidiaphragm. There is no substantial pleural effusion. Thoracic cage is intact. Overlying monitor wires. IMPRESSION: No evidence of acute chest disease, but there is a chronically elevated right hemidiaphragm  with limited view of the right lower lung field. Aortic atherosclerosis and uncoiling. Electronically Signed   By: Almira Bar M.D.   On: 08/12/2023 21:24   ECHOCARDIOGRAM COMPLETE  Result Date: 08/12/2023    ECHOCARDIOGRAM REPORT   Patient Name:   Jocelyn Hooper Date of Exam: 08/12/2023 Medical Rec #:  [id number]    Height:       65.0 in Accession #:    [id number]   Weight:       123.5 lb Date of Birth:  1941/08/11    BSA:          1.611 m Patient Age:    82 years     BP:           142/78 mmHg Patient Gender: F            HR:           94 bpm. Exam Location:  [facility] Procedure: 2D Echo, Color Doppler and Cardiac Doppler Indications:     Bacteremia R78.81  History:         Patient has prior history of Echocardiogram examinations. CAD;                  Signs/Symptoms:Chest Pain.  Sonographer:     Neysa Bonito Roar Referring Phys:  [id number] EUMPNTIR AGBATA Diagnosing Phys: Yvonne Kendall MD IMPRESSIONS  1. Left ventricular ejection fraction, by estimation, is 55 to 60%. The left ventricle has normal function. The left ventricle has no regional wall motion abnormalities. There is mild left ventricular hypertrophy. Left ventricular diastolic parameters are consistent with Grade I diastolic dysfunction (impaired relaxation).  2. Right ventricular systolic function is normal. The right ventricular size is normal. There is normal pulmonary artery systolic pressure.  3. The mitral valve is normal in structure. Mild to moderate mitral valve regurgitation. No evidence of mitral stenosis.  4. The aortic valve was not well visualized. Aortic valve regurgitation is not visualized. No aortic stenosis is present.  5. There is borderline dilatation of the ascending aorta, measuring 36 mm.  6. The inferior vena cava is normal in size with greater than 50% respiratory variability, suggesting right atrial pressure of 3 mmHg. FINDINGS  Left Ventricle: Left ventricular ejection fraction, by estimation, is 55 to 60%. The left ventricle has  normal function. The left ventricle has  no regional wall motion abnormalities. The left ventricular internal cavity size was normal in size. There is  mild left ventricular hypertrophy. Left ventricular diastolic parameters are consistent with Grade I diastolic dysfunction (impaired relaxation). Right Ventricle: The right ventricular size is normal. No increase in right ventricular wall thickness. Right ventricular systolic function is normal. There is normal pulmonary artery systolic pressure. The tricuspid regurgitant velocity is 2.28 m/s, and  with an assumed right atrial pressure of 3 mmHg, the estimated right ventricular systolic pressure is 23.8 mmHg. Left Atrium: Left atrial size was normal in size. Right Atrium: Right atrial size was normal in size. Pericardium: There is no evidence of pericardial effusion. Mitral Valve: The mitral valve is normal in structure. Mild to moderate mitral valve regurgitation. No evidence of mitral valve stenosis. MV peak gradient, 6.2 mmHg. The mean mitral valve gradient is 3.0 mmHg. Tricuspid Valve: The tricuspid valve is grossly normal. Tricuspid valve regurgitation is trivial. Aortic Valve: The aortic valve was not well visualized. Aortic valve regurgitation is not visualized. No aortic stenosis is present. Aortic valve mean gradient measures 3.0 mmHg. Aortic valve peak gradient measures 5.3 mmHg. Aortic valve area, by VTI measures 2.18 cm. Pulmonic Valve: The pulmonic valve was grossly normal. Pulmonic valve regurgitation is not visualized. No evidence of pulmonic stenosis. Aorta: The aortic root is normal in size and structure. There is borderline dilatation of the ascending aorta, measuring 36 mm. Pulmonary Artery: The pulmonary artery is of normal size. Venous: The inferior vena cava is normal in size with greater than 50% respiratory variability, suggesting right atrial pressure of 3 mmHg. IAS/Shunts: No atrial level shunt detected by color flow Doppler.  LEFT VENTRICLE  PLAX 2D LVIDd:         3.90 cm   Diastology LV PW:         1.10 cm   LV e' medial:    7.29 cm/s LV IVS:        1.10 cm   LV E/e' medial:  9.7 LVOT diam:     1.90 cm   LV e' lateral:   7.18 cm/s LV SV:         44        LV E/e' lateral: 9.8 LV SV Index:   27 LVOT Area:     2.84 cm  RIGHT VENTRICLE RV Basal diam:  3.10 cm RV Mid diam:    2.90 cm RV S prime:     11.10 cm/s TAPSE (M-mode): 2.2 cm LEFT ATRIUM             Index        RIGHT ATRIUM          Index LA diam:        3.80 cm 2.36 cm/m   RA Area:     9.36 cm LA Vol (A2C):   40.4 ml 25.07 ml/m  RA Volume:   14.80 ml 9.18 ml/m LA Vol (A4C):   51.9 ml 32.21 ml/m LA Biplane Vol: 48.6 ml 30.16 ml/m  AORTIC VALVE                    PULMONIC VALVE AV Area (Vmax):    2.32 cm     PV Vmax:        0.87 m/s AV Area (Vmean):   2.29 cm     PV Peak grad:   3.0 mmHg AV Area (VTI):     2.18 cm     RVOT Peak  grad: 2 mmHg AV Vmax:           115.00 cm/s AV Vmean:          79.900 cm/s AV VTI:            0.202 m AV Peak Grad:      5.3 mmHg AV Mean Grad:      3.0 mmHg LVOT Vmax:         94.20 cm/s LVOT Vmean:        64.500 cm/s LVOT VTI:          0.155 m LVOT/AV VTI ratio: 0.77  AORTA Ao Root diam: 2.70 cm Ao Asc diam:  3.60 cm MITRAL VALVE                TRICUSPID VALVE MV Area (PHT): 5.13 cm     TR Peak grad:   20.8 mmHg MV Area VTI:   2.34 cm     TR Vmax:        228.00 cm/s MV Peak grad:  6.2 mmHg MV Mean grad:  3.0 mmHg     SHUNTS MV Vmax:       1.24 m/s     Systemic VTI:  0.16 m MV Vmean:      73.8 cm/s    Systemic Diam: 1.90 cm MV Decel Time: 148 msec MV E velocity: 70.70 cm/s MV A velocity: 116.00 cm/s MV E/A ratio:  0.61 MV A Prime:    19.6 cm/s Cristal Deer End MD Electronically signed by Yvonne Kendall MD Signature Date/Time: 08/12/2023/1:04:15 PM    Final      Assessment/Plan: MRSA bacteremia High bio-burden Source not identified Left hip pain- MRI without contrast no obvious abnormality-  Recent spine fusion ( sept 2024) Mri L/S spine - surgical site  looks fine TEE no endocarditis Leucocytosis- improving Pt is currently on vanco Another fever yesterday, so would recommned MI brain to r/o any spetic emboli and Tagged WBC scan VS PET to look for any deep source Repeat blood culture neg  PICc can be placed if she is afebrile for 48 hrs  Encephalopathy- resolved  Urinary retention- has foley  Discussed with patient, family and hospitalist

## 2023-08-13 NOTE — Progress Notes (Addendum)
Additional brief rounding note  Pt again developing fever, tachycardia and tachypnea, with reduced responsiveness similar to what family have reported prior to other fevers.  Onset occurred just after PT attempted to work with patient to get up to chair, but due to HR and pain, pt was returned to the bed.  MRI brain results reviewed and negative for signs of infection.  Shows progression of chronic microvascular ischemic disease since prior study.  Returned to bedside to assess patient.    MEWS red for HR 136, RR 25--30, temp 99.5 >> 100.2  Pt talking on the phone with someone.  Family at bedside report increasing confusion again. I note patient repeats questions from things we just spoke about (for example MRI results, and plan for PET scan Monday).  She reports now feeling better, but pain med has not taken effect yet.   Exam ---  A&O and in no acute distress Pulse is regular and rapid, estimate in 110's by palpation Mildly tachypneic but normal respiratory effort Skin feels dry and normal temp   Plan --- as per progress note and as follows; --Continue Tylenol and or ibuprofen for fevers --May repeat imaging this weekend of her lumbar spine but will discuss with ID --Updated ID above --Ordered repeat blood cultures    Time spent: 30 minutes

## 2023-08-13 NOTE — Progress Notes (Addendum)
Progress Note   Patient: Jocelyn Hooper MVH:[id number] DOB: May 20, 1941 DOA: 08/08/2023     5 DOS: the patient was seen and examined on 08/13/2023   Brief hospital course:  Jocelyn Hooper is a 82 y.o. female with medical history significant of iron deficiency anemia, B12 deficiency, arteriosclerotic disease, nephrolithiasis, scoliosis s/p recent intervention (06/09/2023), who presents to the ED due to altered mental status.   History obtained from both patient and her family at bedside.  Jocelyn Hooper states that she experiencing worsening of her left hip pain and has been told in the past she has bursitis.  Her husband at bedside states that yesterday, she climbed the stairs numerous times in preparation for the holidays.  Jocelyn Hooper also endorses bladder fullness and feels as though she is unable to urinate.  She denies any dysuria, frequency, urgency or abdominal pain though.  She denies any cough, shortness of breath, chest pain, palpitations, lower extremity swelling.  Jocelyn Hooper states that yesterday, she was in her usual state of health but upon awakening today, he noted that she was more confused and had a fever.  He notes that she has frequent UTIs.   [name] at bedside states that patient has had occasional memory issues and repeats herself multiple times but it seems much worse today.   ED course: On arrival to the ED, patient was hypertensive at 139/85 with heart rate of 117.  She was afebrile at 99.4 but subsequently developed a fever of 101.5.  She was saturating at 99% on room air.  Initial workup notable for WBC of 12.6, sodium of 133, bicarb of 18, creatinine 0.75 with GFR above 60.  Lactic acid within normal limits.  COVID-19 negative.  Urinalysis with ketonuria and rare bacteria.  CT of the abdomen was obtained and results pending.  Chest x-ray was obtained with no active disease.  Patient was started on IV fluids and Rocephin.  TRH contacted for admission.    Further hospital  course and management as outlined below.   Assessment and Plan:  Sepsis MRSA bacteremia Sepsis POA as evidenced by fever with a Tmax of 101.9, tachycardia, tachypnea, marked leukocytosis with a white count of 18,000 Blood cultures yield gram-positive cocci, BC ID positive for MRSA Unclear source, patient with complaints of severe left hip pain and is status post recent L5-S1 decompression, instrumentation and fusion on the patient on 06/09/2023.   MRI of the lumbar spine shows status post L5-S1 spinal fusion with a superior endplate compression deformity of the L5 level that is unchanged from 08/08/2023. Eccentric left disc bulges at L4-L5 and L5-S1 with obliteration of the left lateral recesses and severe left neural foraminal narrowing at these levels. Findings have progressed compared to lumbar spine MRI dated 01/11/2023.  MRI of the left hip shows  mild osteoarthritis of the left hip.  No acute findings. Small bilateral hip joint effusions. Mild tendinosis of the bilateral gluteus medius and minimus tendons. Mild right peritrochanteric bursitis. Mild tendinosis of the bilateral hamstring tendon origins.       TEE negative for endocarditis.  11/29 -- Infection source is still undetermined & pt continues to spike high fevers.  --ID is following --MRI brain today --PET scan on Monday to evaluate full body for occult source of infection --Repeat blood cultures -- no growth to date -- follow --Continue IV vancomycin --PICC line can be placed 11/29 if repeat Bcx negative    Acute urinary retention Significant bladder distention noted on CT  imaging with patient's inability to urinate afterwards.   In-N-Out catheter with 600 cc.  Unclear etiology Foley catheter placed 11/26 --D/C Foley for voiding trial 24-48 hours before discharge   Acute metabolic encephalopathy Secondary to sepsis.  Improved back to baseline. Mentation waxes and wanes with fevers. Mgmt of infection as  above Delirium precautions Treat fevers as needed Monitor & replace electrolytes   Hypophosphatemia - phos found to be 1.0 overnight. Treated with IV K-phos 30 mmol  --Repeat phos level this afternoon and with daily labs     Hypokalemia - resolved with replacement Supplement potassium   ASCVD (arteriosclerotic cardiovascular disease) Continue home aspirin and metoprolol        Subjective: Pt awake resting in bed with family present when seen today.  She spiked high fevers again overnight and was reportedly near unresponsive at the time.  Today pt reports feeling okay.  No fever/chills so far this morning.  Having back and hip pain that are about the same as they have been.  No headache or other acute complaints.   Physical Exam: Vitals:   08/13/23 0436 08/13/23 0859 08/13/23 1230 08/13/23 1624  BP: (!) 104/59 122/63 116/61   Pulse: 67 81 83   Resp: 18 18 17    Temp: (!) 97.3 F (36.3 C) 98.6 F (37 C) 98 F (36.7 C) 99.5 F (37.5 C)  TempSrc: Oral Oral  Oral  SpO2: 99% 96% 96%   Weight:      Height:       General exam: awake, alert, no acute distress, more interactive and talkative today HEENT: clear conjunctiva, anicteric sclera, moist mucus membranes, hard of hearing  Respiratory system: on room air, normal respiratory effort. Lungs CTAB no wheezes or rhonchi Cardiovascular system: normal S1/S2, RRR, no pedal edema.   Gastrointestinal system: soft, NT, ND Central nervous system: A&O x. no gross focal neurologic deficits, normal speech Extremities: moves all, no edema, normal tone Skin: dry, intact, warm, no rashes seen on visualized skin Psychiatry: normal mood & affect, judgment and insight appear normal       Data Reviewed: Labs reviewed & notable for --  WBC 18.9 >> 18.2 >> 15.3 >> 13.8 Hbg 12.1 >> 10.5 >> 9.5 >> 8.9 Na 134 Bicarb 17 Glucose 133 Ca 8.0  Vitamin D normal 47.27  11/28 -- CRP 26.3 / ESR 107  / Lactic acid normal 0.9   Procedures  --  TEE 11/27 negative for endocarditis, see report. 2D echo 11/28 - EF 55-60%, grade I DD, mild LVH, mild-mod MR   Family Communication: Husband and daughter at bedside on rounds this AM   Disposition: Status is: Inpatient Remains inpatient appropriate because: Treatment of MRSA bacteremia, ongoing evaluation, still spiking fevers    Planned Discharge Destination:  TBD    Time spent: 55 minutes including time at bedside and in coordination of care with staff and consultants, review and interpretation of diagnostic studies  Author: Pennie Banter, DO 08/13/2023 4:30 PM  For on call review [email or web address].

## 2023-08-13 NOTE — TOC Progression Note (Signed)
Transition of Care [facility]) - Progression Note    Patient Details  Name: Jocelyn Hooper MRN: [id number] Date of Birth: [birth year]  Transition of Care [facility]) CM/SW Contact  Truddie Hidden, RN Phone Number: 08/13/2023, 10:08 AM  Clinical Narrative:    Spoke with patient regarding discharge plan. Patient is not agreeable to therapy's recommendation for SNF. She is agreeable to [facility] and does not have a preference. She was advised home infusions will be needed. Patient spouse and her daughter will be available to assist with care at home. She was advised Pam from [employer] will coordinate infusion teaching.  Referral sent to [facility] from [facility] Message left for Pam at [employer].     Expected Discharge Plan: Home w Home Health Services Barriers to Discharge: Continued Medical Work up  Expected Discharge Plan and Services                                               Social Determinants of Health (SDOH) Interventions SDOH Screenings   Food Insecurity: No Food Insecurity (08/09/2023)  Housing: Low Risk  (08/09/2023)  Transportation Needs: No Transportation Needs (08/09/2023)  Utilities: Not At Risk (08/09/2023)  Financial Resource Strain: Low Risk  (07/01/2023)   Received from [facility]  Tobacco Use: Low Risk  (08/08/2023)    Readmission Risk Interventions     No data to display

## 2023-08-13 NOTE — Progress Notes (Signed)
Physical Therapy Treatment Patient Details Name: Jocelyn Hooper MRN: [id number] DOB: 04-14-1941 Today's Date: 08/13/2023   History of Present Illness Pt is an 82 yo F presenting due to AMS, with MRSA, sepsis, acute urinary retention, acute metabolic encephalopathy, arteriosclerotic CVD, and hypokalemia. PMH significant for L5-S1 TLIF (06/09/2023), anemia, L shoulder bursitis, CAD, CSF leak, IBS, Scoliosis, Trigeminal neuralgia, and B TKR.    PT Comments  Pt was pleasant and motivated to participate during the session and put forth good effort throughout. Pt found on room air with SpO2 WNL and HR in the 100s to low 110s.  Pt required physical assist and cuing with bed mobility training and once in sitting required physical assist to prevent posterior LOB initially but improved during the session to SBA.  Pt's HR in upper 110s to low 120s in sitting with no adverse symptoms other than L hip pain.  Pt required +2 assist to come to standing and once in standing pt's HR quickly increased to a high of 134 with further mobility deferred and pt returned to supine, MD and nursing notified of elevated HR in standing.  Pt will benefit from continued PT services upon discharge to safely address deficits listed in patient problem list for decreased caregiver assistance and eventual return to PLOF.      If plan is discharge home, recommend the following: Assistance with cooking/housework;Help with stairs or ramp for entrance;Supervision due to cognitive status;Assist for transportation;Direct supervision/assist for financial management;A lot of help with walking and/or transfers;A lot of help with bathing/dressing/bathroom   Can travel by private vehicle     No  Equipment Recommendations  Other (comment) (TBD)    Recommendations for Other Services       Precautions / Restrictions Precautions Precautions: Fall Restrictions Weight Bearing Restrictions: No Other Position/Activity Restrictions: watch HR      Mobility  Bed Mobility Overal bed mobility: Needs Assistance Bed Mobility: Sit to Sidelying Rolling: Mod assist Sidelying to sit: Mod assist, +2 for physical assistance, HOB elevated   Sit to supine: Max assist, +2 for physical assistance   General bed mobility comments: +2 mod to max A for BLE and trunk control    Transfers Overall transfer level: Needs assistance Equipment used: Rolling walker (2 wheels) Transfers: Sit to/from Stand Sit to Stand: Min assist, +2 physical assistance, From elevated surface           General transfer comment: Mod verbal and tactile cues for hand placement    Ambulation/Gait         Gait velocity: decreased     General Gait Details: deferred secondary to elevated HR in standing   Stairs             Wheelchair Mobility     Tilt Bed    Modified Rankin (Stroke Patients Only)       Balance Overall balance assessment: Needs assistance Sitting-balance support: Feet supported, Bilateral upper extremity supported Sitting balance-Leahy Scale: Poor Sitting balance - Comments: Occasional min A for static sitting balance initially to prevent posterior LOB but improved during the session to pt needing only SBA Postural control: Posterior lean Standing balance support: Bilateral upper extremity supported, Reliant on assistive device for balance Standing balance-Leahy Scale: Poor                              Cognition Arousal: Alert Behavior During Therapy: WFL for tasks assessed/performed Overall Cognitive Status: Within Functional Limits  for tasks assessed                                          Exercises Total Joint Exercises Heel Slides: AAROM, Right, 5 reps Other Exercises Other Exercises: Static sitting at EOB for improved static sitting balance, core strength, and general activity tolerance    General Comments        Pertinent Vitals/Pain Pain Assessment Pain Assessment:  Faces Faces Pain Scale: Hurts little more Pain Location: L hip and R thigh with movement, no pain at rest Pain Descriptors / Indicators: Grimacing, Discomfort Pain Intervention(s): Limited activity within patient's tolerance, Repositioned    Home Living                          Prior Function            PT Goals (current goals can now be found in the care plan section) Progress towards PT goals: Progressing toward goals    Frequency    Min 1X/week      PT Plan      Co-evaluation              AM-PAC PT "6 Clicks" Mobility   Outcome Measure  Help needed turning from your back to your side while in a flat bed without using bedrails?: A Little Help needed moving from lying on your back to sitting on the side of a flat bed without using bedrails?: A Lot Help needed moving to and from a bed to a chair (including a wheelchair)?: A Lot Help needed standing up from a chair using your arms (e.g., wheelchair or bedside chair)?: A Lot Help needed to walk in hospital room?: A Lot Help needed climbing 3-5 steps with a railing? : Total 6 Click Score: 12    End of Session Equipment Utilized During Treatment: Gait belt Activity Tolerance: Other (comment) (Increased HR in standing per above) Patient left: in bed;with call bell/phone within reach;with bed alarm set;with family/visitor present Nurse Communication: Mobility status;Other (comment) (nursing and MD notified of increased HR in standing per above) PT Visit Diagnosis: Difficulty in walking, not elsewhere classified (R26.2);Muscle weakness (generalized) (M62.81);Pain Pain - Right/Left: Left Pain - part of body: Hip     Time: 4696-2952 PT Time Calculation (min) (ACUTE ONLY): 27 min  Charges:    $Therapeutic Activity: 23-37 mins PT General Charges $$ ACUTE PT VISIT: 1 Visit                    D. Scott Naftoli Penny PT, DPT 08/13/23, 5:20 PM

## 2023-08-13 NOTE — Progress Notes (Signed)
Pharmacy Antibiotic Note  Jocelyn Hooper is a 82 y.o. female admitted on 08/08/2023 with bacteremia.  Pharmacy has been consulted for Vancomycin dosing. Recently lumbar fusion in sept.  Treated for UTI in October (no cultures available, only UAs).    Today, 08/13/2023 Day # 5 vancomycin Renal: SCr 0.66 WBC 13.8 Tm 103.5 11/24 blood cx: MRSA--> repeat blood cx in process 11/25 MRI lumbar spine - no evidence of infection 11/25 MRI for Hip pain - Mild osteoarthritis of the left hip. No acute findings; Small bilateral hip joint effusions; Mild tendinosis of the bilateral gluteus medius and minimus tendons. Mild right peritrochanteric bursitis; Mild tendinosis of the bilateral hamstring tendon origins  11/28 TEE - no vegetation  Assessment: Vancomycin levels 11/27-28 Current regimen: Vancomycin 750 mg IV Q24H Preceding dose 11/27 @ 2118  Vanc pk 11/28 @ 0022: 16 ug/mL  Vanc tr 11/28 @ 1943: 8 ug/mL PK Calculations AUC: 282.4 Cmin: 7.6 T 1/2: 19.4hr  Plan: Vancomycin dose adjusted per levels 11/28, continue vancomycin 1250mg  IV q24h  Expected AUC: 470.5 Expected Css min: 12.7 Monitor renal function - recheck SCr this weekend.  Recheck vanocmycin levels early next week Remains febrile Check vancomycin level at steady state Await repeat blood cultures, ECHO, etc.  ID following  Height: 5\' 5"  (165.1 cm) Weight: 56 kg (123 lb 7.3 oz) IBW/kg (Calculated) : 57  Temp (24hrs), Avg:99.2 F (37.3 C), Min:96.3 F (35.7 C), Max:103.5 F (39.7 C)  Recent Labs  Lab 08/08/23 1048 08/09/23 0744 08/09/23 1754 08/09/23 2000 08/10/23 0529 08/10/23 0818 08/11/23 0437 08/12/23 0022 08/12/23 1943 08/12/23 2055 08/13/23 0343  WBC 12.6* 18.9*  --   --   --  18.2*  --  15.3*  --  13.7* 13.8*  CREATININE 0.75 0.88  --   --    < > 0.75 0.73 0.61  --  0.64 0.66  LATICACIDVEN 1.0  --  2.8* 1.3  --   --   --  0.9  --  0.8  --   VANCOTROUGH  --   --   --   --   --   --   --   --  8*  --   --    VANCOPEAK  --   --   --   --   --   --   --  16*  --   --   --    < > = values in this interval not displayed.    Estimated Creatinine Clearance: 46.3 mL/min (by C-G formula based on SCr of 0.66 mg/dL).    Allergies  Allergen Reactions   Avelox [Moxifloxacin Hcl In Nacl] Other (See Comments)    Redness all over in about 5 minutes after taking   Codeine Nausea Only   Acyclovir And Related     Turn red all over    Shrimp [Shellfish Allergy]     Hives     Ciprofloxacin Rash   Moxifloxacin Hives and Rash   Sucralfate Rash    Itching     Antimicrobials this admission: 11/27 ceftriaxone X 1 11/25 vancomycin >>  Dose adjustments this admission:   Microbiology results:  11/24 BCx: 4/4 GPC, BCID MRSA   11/24 UCx:  Multiple species present, suggest re-collection  11/26 Bcx: NGTD   Thank you for allowing pharmacy to be a part of this patient's care.  Juliette Alcide, PharmD, BCPS, BCIDP Work Cell: [phone] 08/13/2023 8:24 AM

## 2023-08-14 ENCOUNTER — Inpatient Hospital Stay: Payer: PPO

## 2023-08-14 ENCOUNTER — Encounter: Payer: Self-pay | Admitting: Cardiovascular Disease

## 2023-08-14 DIAGNOSIS — G9341 Metabolic encephalopathy: Secondary | ICD-10-CM | POA: Diagnosis not present

## 2023-08-14 DIAGNOSIS — R338 Other retention of urine: Secondary | ICD-10-CM | POA: Diagnosis not present

## 2023-08-14 DIAGNOSIS — M4856XA Collapsed vertebra, not elsewhere classified, lumbar region, initial encounter for fracture: Secondary | ICD-10-CM | POA: Diagnosis not present

## 2023-08-14 DIAGNOSIS — A4102 Sepsis due to Methicillin resistant Staphylococcus aureus: Secondary | ICD-10-CM | POA: Diagnosis not present

## 2023-08-14 DIAGNOSIS — M48061 Spinal stenosis, lumbar region without neurogenic claudication: Secondary | ICD-10-CM | POA: Diagnosis not present

## 2023-08-14 DIAGNOSIS — M5126 Other intervertebral disc displacement, lumbar region: Secondary | ICD-10-CM | POA: Diagnosis not present

## 2023-08-14 DIAGNOSIS — R6520 Severe sepsis without septic shock: Secondary | ICD-10-CM | POA: Diagnosis not present

## 2023-08-14 DIAGNOSIS — R600 Localized edema: Secondary | ICD-10-CM | POA: Diagnosis not present

## 2023-08-14 LAB — CBC WITH DIFFERENTIAL/PLATELET
Abs Immature Granulocytes: 0.18 10*3/uL — ABNORMAL HIGH (ref 0.00–0.07)
Basophils Absolute: 0.1 10*3/uL (ref 0.0–0.1)
Basophils Relative: 0 %
Eosinophils Absolute: 0.2 10*3/uL (ref 0.0–0.5)
Eosinophils Relative: 2 %
HCT: 27 % — ABNORMAL LOW (ref 36.0–46.0)
Hemoglobin: 9.3 g/dL — ABNORMAL LOW (ref 12.0–15.0)
Immature Granulocytes: 1 %
Lymphocytes Relative: 10 %
Lymphs Abs: 1.4 10*3/uL (ref 0.7–4.0)
MCH: 31.4 pg (ref 26.0–34.0)
MCHC: 34.4 g/dL (ref 30.0–36.0)
MCV: 91.2 fL (ref 80.0–100.0)
Monocytes Absolute: 1.3 10*3/uL — ABNORMAL HIGH (ref 0.1–1.0)
Monocytes Relative: 10 %
Neutro Abs: 10.1 10*3/uL — ABNORMAL HIGH (ref 1.7–7.7)
Neutrophils Relative %: 77 %
Platelets: 224 10*3/uL (ref 150–400)
RBC: 2.96 MIL/uL — ABNORMAL LOW (ref 3.87–5.11)
RDW: 15.2 % (ref 11.5–15.5)
WBC: 13.3 10*3/uL — ABNORMAL HIGH (ref 4.0–10.5)
nRBC: 0 % (ref 0.0–0.2)

## 2023-08-14 LAB — BASIC METABOLIC PANEL
Anion gap: 8 (ref 5–15)
BUN: 16 mg/dL (ref 8–23)
CO2: 18 mmol/L — ABNORMAL LOW (ref 22–32)
Calcium: 8.3 mg/dL — ABNORMAL LOW (ref 8.9–10.3)
Chloride: 108 mmol/L (ref 98–111)
Creatinine, Ser: 0.79 mg/dL (ref 0.44–1.00)
GFR, Estimated: >60 mL/min (ref >60)
Glucose, Bld: 106 mg/dL — ABNORMAL HIGH (ref 70–99)
Potassium: 3.4 mmol/L — ABNORMAL LOW (ref 3.5–5.1)
Sodium: 134 mmol/L — ABNORMAL LOW (ref 135–145)

## 2023-08-14 LAB — CORTISOL-AM, BLOOD: Cortisol - AM: 14 ug/dL (ref 6.7–22.6)

## 2023-08-14 LAB — PHOSPHORUS: Phosphorus: 2.6 mg/dL (ref 2.5–4.6)

## 2023-08-14 MED ORDER — ACETAMINOPHEN 650 MG RE SUPP: 650.0000 mg | Freq: Four times a day (QID) | RECTAL | Status: DC | PRN

## 2023-08-14 MED ORDER — FENTANYL CITRATE PF 50 MCG/ML IJ SOSY
25.0000 ug | PREFILLED_SYRINGE | INTRAMUSCULAR | Status: DC | PRN
  Administered 2023-08-14: 25 ug via INTRAVENOUS
  Filled 2023-08-14: qty 1

## 2023-08-14 MED ORDER — LORAZEPAM 2 MG/ML IJ SOLN
0.5000 mg | Freq: Once | INTRAMUSCULAR | Status: AC
  Administered 2023-08-14: 0.5 mg via INTRAVENOUS
  Filled 2023-08-14: qty 1

## 2023-08-14 MED ORDER — OXYCODONE HCL 5 MG PO TABS
5.0000 mg | ORAL_TABLET | ORAL | Status: DC | PRN
  Administered 2023-08-14: 5 mg via ORAL
  Filled 2023-08-14: qty 2

## 2023-08-14 MED ORDER — ACETAMINOPHEN 325 MG PO TABS
650.0000 mg | ORAL_TABLET | Freq: Four times a day (QID) | ORAL | Status: DC | PRN
  Administered 2023-08-15 – 2023-08-16 (×2): 650 mg via ORAL
  Filled 2023-08-14 (×2): qty 2

## 2023-08-14 MED ORDER — FENTANYL CITRATE PF 50 MCG/ML IJ SOSY
50.0000 ug | PREFILLED_SYRINGE | Freq: Once | INTRAMUSCULAR | Status: AC
  Administered 2023-08-14: 50 ug via INTRAVENOUS
  Filled 2023-08-14: qty 1

## 2023-08-14 MED ORDER — GADOBUTROL 1 MMOL/ML IV SOLN
5.0000 mL | Freq: Once | INTRAVENOUS | Status: AC | PRN
Start: 2023-08-14 — End: 2023-08-14
  Administered 2023-08-14: 5 mL via INTRAVENOUS

## 2023-08-14 NOTE — Evaluation (Signed)
Occupational Therapy Re-Evaluation Patient Details Name: Jocelyn Hooper MRN: [id number] DOB: Feb 11, 1941 Today's Date: 08/14/2023   History of Present Illness Pt is an 82 yo F presenting due to AMS, with MRSA, sepsis, acute urinary retention, acute metabolic encephalopathy, arteriosclerotic CVD, and hypokalemia. PMH significant for L5-S1 TLIF (06/09/2023), anemia, L shoulder bursitis, CAD, CSF leak, IBS, Scoliosis, Trigeminal neuralgia, and B TKR.   Clinical Impression   Upon entering the room, pt supine in bed and reports need for BM. Pt needing max A of 2 for bed mobility to EOB and then reports pain is too great to attempt transfer. Pt is very resistive to movement once on EOB and pushing backwards towards therapist requiring increased assistance for safety. Pt needing +2 assist to return to supine and rolls with +2 assistance to be placed onto bed pan. RN notified and pt request pain medication. All goals remain appropriate at this time. Continue per OT POC.       If plan is discharge home, recommend the following: Help with stairs or ramp for entrance;Assist for transportation;Assistance with cooking/housework;Two people to help with walking and/or transfers;Two people to help with bathing/dressing/bathroom    Functional Status Assessment     Equipment Recommendations  Other (comment) (defer to next venue of care)       Precautions / Restrictions Precautions Precautions: Fall Restrictions Other Position/Activity Restrictions: watch HR      Mobility Bed Mobility Overal bed mobility: Needs Assistance Bed Mobility: Rolling, Supine to Sit, Sit to Supine   Sidelying to sit: Max assist, +2 for physical assistance Supine to sit: Max assist, Used rails, HOB elevated, +2 for physical assistance Sit to supine: Max assist, +2 for physical assistance        Transfers                   General transfer comment: pt refusing to attempt once getting EOB      Balance Overall  balance assessment: Needs assistance Sitting-balance support: Feet supported, Bilateral upper extremity supported Sitting balance-Leahy Scale: Poor                                     ADL either performed or assessed with clinical judgement   ADL Overall ADL's : Needs assistance/impaired                                       General ADL Comments: max A of 2 for bed mobility and then pt refusing to attempt transfer to [facility]. Pt returning back to bed and rolled onto bedpan with max A of 2     Vision Patient Visual Report: No change from baseline              Pertinent Vitals/Pain Pain Assessment Pain Assessment: No/denies pain     Extremity/Trunk Assessment Upper Extremity Assessment Upper Extremity Assessment: Generalized weakness   Lower Extremity Assessment Lower Extremity Assessment: Generalized weakness       Communication Communication Communication: No apparent difficulties   Cognition Arousal: Alert Behavior During Therapy: WFL for tasks assessed/performed Overall Cognitive Status: Within Functional Limits for tasks assessed  OT Goals(Current goals can be found in the care plan section) Acute Rehab OT Goals Patient Stated Goal: to go home OT Goal Formulation: With patient/family Time For Goal Achievement: 08/28/23 Potential to Achieve Goals: Fair  OT Frequency: Min 1X/week       AM-PAC OT "6 Clicks" Daily Activity     Outcome Measure Help from another person eating meals?: None Help from another person taking care of personal grooming?: A Little Help from another person toileting, which includes using toliet, bedpan, or urinal?: Total Help from another person bathing (including washing, rinsing, drying)?: A Lot Help from another person to put on and taking off regular upper body clothing?: A Little Help from another person to put on and taking off  regular lower body clothing?: Total 6 Click Score: 14   End of Session Nurse Communication: Mobility status;Patient requests pain meds;Other (comment) (pt on bedpan)  Activity Tolerance: Patient limited by pain Patient left: in bed;with bed alarm set;with call bell/phone within reach  OT Visit Diagnosis: Muscle weakness (generalized) (M62.81);Other abnormalities of gait and mobility (R26.89);Unsteadiness on feet (R26.81)                Time: 4098-1191 OT Time Calculation (min): 13 min Charges:  OT General Charges $OT Visit: 1 Visit OT Evaluation $OT Re-eval: 1 Re-eval OT Treatments $Self Care/Home Management : 8-22 mins  Jackquline Denmark, MS, OTR/L , CBIS ascom [phone]  08/14/23, 10:52 AM

## 2023-08-14 NOTE — Progress Notes (Signed)
Progress Note   Jocelyn Hooper: Jocelyn Hooper QIO:[id number] DOB: 02/07/1941 DOA: 08/08/2023     6 DOS: the Jocelyn Hooper was seen and examined on 08/14/2023   Brief hospital course:  Jocelyn Hooper is a 82 y.o. female with medical history significant of iron deficiency anemia, B12 deficiency, arteriosclerotic disease, nephrolithiasis, scoliosis s/p recent intervention (06/09/2023), who presents to the ED due to altered mental status.   History obtained from both Jocelyn Hooper and her family at bedside.  Jocelyn Hooper states that she experiencing worsening of her left hip pain and has been told in the past she has bursitis.  Her husband at bedside states that yesterday, she climbed the stairs numerous times in preparation for the holidays.  Jocelyn Hooper also endorses bladder fullness and feels as though she is unable to urinate.  She denies any dysuria, frequency, urgency or abdominal pain though.  She denies any cough, shortness of breath, chest pain, palpitations, lower extremity swelling.  Jocelyn Hooper states that yesterday, she was in her usual state of health but upon awakening today, he noted that she was more confused and had a fever.  He notes that she has frequent UTIs.   Jocelyn Hooper's daughter at bedside states that Jocelyn Hooper has had occasional memory issues and repeats herself multiple times but it seems much worse today.   ED course: On arrival to the ED, Jocelyn Hooper was hypertensive at 139/85 with heart rate of 117.  She was afebrile at 99.4 but subsequently developed a fever of 101.5.  She was saturating at 99% on room air.  Initial workup notable for WBC of 12.6, sodium of 133, bicarb of 18, creatinine 0.75 with GFR above 60.  Lactic acid within normal limits.  COVID-19 negative.  Urinalysis with ketonuria and rare bacteria.  CT of the abdomen was obtained and results pending.  Chest x-ray was obtained with no active disease.  Jocelyn Hooper was started on IV fluids and Rocephin.  TRH contacted for admission.    Further hospital  course and management as outlined below.   Assessment and Plan:  Sepsis MRSA bacteremia Sepsis POA as evidenced by fever with a Tmax of 101.9, tachycardia, tachypnea, marked leukocytosis with a white count of 18,000 Blood cultures yield gram-positive cocci, BC ID positive for MRSA Unclear source, Jocelyn Hooper with complaints of severe left hip pain and is status post recent L5-S1 decompression, instrumentation and fusion on the Jocelyn Hooper on 06/09/2023.   MRI of the lumbar spine shows status post L5-S1 spinal fusion with a superior endplate compression deformity of the L5 level that is unchanged from 08/08/2023. Eccentric left disc bulges at L4-L5 and L5-S1 with obliteration of the left lateral recesses and severe left neural foraminal narrowing at these levels. Findings have progressed compared to lumbar spine MRI dated 01/11/2023.  MRI of the left hip shows  mild osteoarthritis of the left hip.  No acute findings. Small bilateral hip joint effusions. Mild tendinosis of the bilateral gluteus medius and minimus tendons. Mild right peritrochanteric bursitis. Mild tendinosis of the bilateral hamstring tendon origins.       TEE negative for endocarditis.  11/29 -- Infection source is still undetermined & pt continues to spike high fevers.  --ID is following --MRI brain 11/29 negative --Repeat MRI Lumbar spine with contrast --PET scan on Monday to evaluate full body for occult source of infection if MRI lumbar is negative --Initial repeat Bcx from 11/26 no growth x 4 days --Repeat blood cultures 11/29 showing 1/4 bottles with GPC's  --Continue IV vancomycin --  Treat fevers as needed --Pain regimen increased & resumed IV Fentanyl for breakthrough pain    Acute urinary retention Significant bladder distention noted on CT imaging with Jocelyn Hooper's inability to urinate afterwards.   In-N-Out catheter with 600 cc.  Unclear etiology Foley catheter placed 11/26 --D/C Foley for voiding trial 24-48 hours  before discharge   Acute metabolic encephalopathy Secondary to sepsis.  Improved back to baseline. Mentation waxes and wanes with fevers. --Mgmt of infection as above --Delirium precautions --Treat fevers as needed --Monitor & replace electrolytes   Hypophosphatemia - resolved with replacement  Phos 1.0 replaced >> 4.5 >> 2.6 --Monitor phos levels daily until stable    Hypokalemia - resolved with replacement Supplement potassium   ASCVD (arteriosclerotic cardiovascular disease) Continue home aspirin and metoprolol        Subjective: Pt awake resting in bed with family present when seen today.  Again pt had fever with Tmax 101.9 around 530 PM yesterday, onset after attempting out of bed with PT.  Pt reports increased back pain today, with radiation down the right leg.  She states this is the worst her back has hurt so far.     Physical Exam: Vitals:   08/14/23 0001 08/14/23 0427 08/14/23 1000 08/14/23 1300  BP: (!) 103/58 116/61 (!) 124/57 122/60  Pulse: 93 86 97 98  Resp: 19 19 19 18   Temp: 99.7 F (37.6 C) 98.9 F (37.2 C) 99.5 F (37.5 C) 99.4 F (37.4 C)  TempSrc:   Oral Oral  SpO2: 95% 98% 97% 97%  Weight:      Height:       General exam: awake, alert, no acute distress HEENT: moist mucus membranes, hearing grossly normal Respiratory system: on room air, normal respiratory effort. CTAB Cardiovascular system: normal S1/S2, RRR, no pedal edema.   Gastrointestinal system: soft, NT, ND Central nervous system: A&O x. no gross focal neurologic deficits, normal speech Extremities: moves all, no edema, normal tone Skin: dry, intact, warm, no rashes seen on visualized skin Psychiatry: normal mood & affect, judgment and insight appear normal       Data Reviewed: Labs reviewed & notable for --   WBC 18.9 >> 18.2 >> 15.3 >> 13.8 >> 13.3 Hbg 12.1 >> 10.5 >> 9.5 >> 8.9 >> 9.3  Na 134 K 3.4 Bicarb 18 Glucose 106 Ca 8.3  Vitamin D normal 47.27  11/28 --  CRP 26.3 / ESR 107  / Lactic acid normal 0.9  Pending -- MRI lumbar spine with contrast    Procedures --  TEE 11/27 negative for endocarditis, see report. 2D echo 11/28 - EF 55-60%, grade I DD, mild LVH, mild-mod MR   Family Communication: Multiple family members  at bedside on rounds this AM   Disposition: Status is: Inpatient Remains inpatient appropriate because: Treatment of MRSA bacteremia, ongoing evaluation, still spiking fevers    Planned Discharge Destination:  TBD    Time spent: 45 minutes  Author: Pennie Banter, DO 08/14/2023 4:15 PM  For on call review [email or web address].

## 2023-08-14 NOTE — Consult Note (Signed)
PHARMACY - PHYSICIAN COMMUNICATION CRITICAL VALUE ALERT - BLOOD CULTURE IDENTIFICATION (BCID)  Jocelyn Hooper is an 82 y.o. female who presented to [facility] on 08/08/2023 with a chief complaint of altered mental status. Patient is currently being treated for MRSA bacteremia with a high bio-burden.   Assessment: 11/29 Blood cultures; 1/4 GPC, BCID no done.  Name of physician (or Provider) Contacted: Dr. Denton Lank & Dr. Lynn Ito   Current antibiotics:  Vancomycin 1,250 mg IV Q24H Ceftriaxone 2g IV Q24H   Changes to prescribed antibiotics recommended:  On Vancomycin and ceftriaxone. ID following for further recommendations.  Results for orders placed or performed during the hospital encounter of 08/08/23  Blood Culture ID Panel (Reflexed) (Collected: 08/08/2023 10:53 AM)  Result Value Ref Range   Enterococcus faecalis NOT DETECTED NOT DETECTED   Enterococcus Faecium NOT DETECTED NOT DETECTED   Listeria monocytogenes NOT DETECTED NOT DETECTED   Staphylococcus species DETECTED (A) NOT DETECTED   Staphylococcus aureus (BCID) DETECTED (A) NOT DETECTED   Staphylococcus epidermidis NOT DETECTED NOT DETECTED   Staphylococcus lugdunensis NOT DETECTED NOT DETECTED   Streptococcus species NOT DETECTED NOT DETECTED   Streptococcus agalactiae NOT DETECTED NOT DETECTED   Streptococcus pneumoniae NOT DETECTED NOT DETECTED   Streptococcus pyogenes NOT DETECTED NOT DETECTED   A.calcoaceticus-baumannii NOT DETECTED NOT DETECTED   Bacteroides fragilis NOT DETECTED NOT DETECTED   Enterobacterales NOT DETECTED NOT DETECTED   Enterobacter cloacae complex NOT DETECTED NOT DETECTED   Escherichia coli NOT DETECTED NOT DETECTED   Klebsiella aerogenes NOT DETECTED NOT DETECTED   Klebsiella oxytoca NOT DETECTED NOT DETECTED   Klebsiella pneumoniae NOT DETECTED NOT DETECTED   Proteus species NOT DETECTED NOT DETECTED   Salmonella species NOT DETECTED NOT DETECTED   Serratia marcescens NOT  DETECTED NOT DETECTED   Haemophilus influenzae NOT DETECTED NOT DETECTED   Neisseria meningitidis NOT DETECTED NOT DETECTED   Pseudomonas aeruginosa NOT DETECTED NOT DETECTED   Stenotrophomonas maltophilia NOT DETECTED NOT DETECTED   Candida albicans NOT DETECTED NOT DETECTED   Candida auris NOT DETECTED NOT DETECTED   Candida glabrata NOT DETECTED NOT DETECTED   Candida krusei NOT DETECTED NOT DETECTED   Candida parapsilosis NOT DETECTED NOT DETECTED   Candida tropicalis NOT DETECTED NOT DETECTED   Cryptococcus neoformans/gattii NOT DETECTED NOT DETECTED   Meth resistant mecA/C and MREJ DETECTED (A) NOT DETECTED    Effie Shy, PharmD Pharmacy Resident  08/14/2023 1:38 PM

## 2023-08-15 DIAGNOSIS — M4646 Discitis, unspecified, lumbar region: Secondary | ICD-10-CM

## 2023-08-15 DIAGNOSIS — R6520 Severe sepsis without septic shock: Secondary | ICD-10-CM | POA: Diagnosis not present

## 2023-08-15 DIAGNOSIS — G9340 Encephalopathy, unspecified: Secondary | ICD-10-CM | POA: Diagnosis not present

## 2023-08-15 DIAGNOSIS — A4102 Sepsis due to Methicillin resistant Staphylococcus aureus: Secondary | ICD-10-CM | POA: Diagnosis not present

## 2023-08-15 DIAGNOSIS — R338 Other retention of urine: Secondary | ICD-10-CM | POA: Diagnosis not present

## 2023-08-15 DIAGNOSIS — G9341 Metabolic encephalopathy: Secondary | ICD-10-CM | POA: Diagnosis not present

## 2023-08-15 DIAGNOSIS — R7881 Bacteremia: Secondary | ICD-10-CM | POA: Diagnosis not present

## 2023-08-15 DIAGNOSIS — B9562 Methicillin resistant Staphylococcus aureus infection as the cause of diseases classified elsewhere: Secondary | ICD-10-CM | POA: Diagnosis not present

## 2023-08-15 LAB — CBC WITH DIFFERENTIAL/PLATELET
Abs Immature Granulocytes: 0.26 10*3/uL — ABNORMAL HIGH (ref 0.00–0.07)
Basophils Absolute: 0.1 10*3/uL (ref 0.0–0.1)
Basophils Relative: 0 %
Eosinophils Absolute: 0.1 10*3/uL (ref 0.0–0.5)
Eosinophils Relative: 1 %
HCT: 27.8 % — ABNORMAL LOW (ref 36.0–46.0)
Hemoglobin: 9.4 g/dL — ABNORMAL LOW (ref 12.0–15.0)
Immature Granulocytes: 2 %
Lymphocytes Relative: 12 %
Lymphs Abs: 1.6 10*3/uL (ref 0.7–4.0)
MCH: 31.3 pg (ref 26.0–34.0)
MCHC: 33.8 g/dL (ref 30.0–36.0)
MCV: 92.7 fL (ref 80.0–100.0)
Monocytes Absolute: 1.3 10*3/uL — ABNORMAL HIGH (ref 0.1–1.0)
Monocytes Relative: 10 %
Neutro Abs: 10.3 10*3/uL — ABNORMAL HIGH (ref 1.7–7.7)
Neutrophils Relative %: 75 %
Platelets: 301 10*3/uL (ref 150–400)
RBC: 3 MIL/uL — ABNORMAL LOW (ref 3.87–5.11)
RDW: 14.8 % (ref 11.5–15.5)
WBC: 13.5 10*3/uL — ABNORMAL HIGH (ref 4.0–10.5)
nRBC: 0 % (ref 0.0–0.2)

## 2023-08-15 LAB — CULTURE, BLOOD (ROUTINE X 2)
Culture: NO GROWTH
Culture: NO GROWTH
Special Requests: ADEQUATE
Special Requests: ADEQUATE

## 2023-08-15 LAB — PHOSPHORUS: Phosphorus: 2.7 mg/dL (ref 2.5–4.6)

## 2023-08-15 LAB — BASIC METABOLIC PANEL
Anion gap: 9 (ref 5–15)
BUN: 15 mg/dL (ref 8–23)
CO2: 19 mmol/L — ABNORMAL LOW (ref 22–32)
Calcium: 8.1 mg/dL — ABNORMAL LOW (ref 8.9–10.3)
Chloride: 104 mmol/L (ref 98–111)
Creatinine, Ser: 0.68 mg/dL (ref 0.44–1.00)
GFR, Estimated: >60 mL/min (ref >60)
Glucose, Bld: 99 mg/dL (ref 70–99)
Potassium: 3.1 mmol/L — ABNORMAL LOW (ref 3.5–5.1)
Sodium: 132 mmol/L — ABNORMAL LOW (ref 135–145)

## 2023-08-15 MED ORDER — DOCUSATE SODIUM 100 MG PO CAPS
100.0000 mg | ORAL_CAPSULE | Freq: Two times a day (BID) | ORAL | Status: DC
  Administered 2023-08-15 – 2023-08-17 (×5): 100 mg via ORAL
  Filled 2023-08-15 (×5): qty 1

## 2023-08-15 MED ORDER — MELATONIN 5 MG PO TABS
10.0000 mg | ORAL_TABLET | Freq: Every evening | ORAL | Status: DC | PRN
  Administered 2023-08-20: 10 mg via ORAL
  Filled 2023-08-15 (×2): qty 2

## 2023-08-15 MED ORDER — MECLIZINE HCL 25 MG PO TABS
25.0000 mg | ORAL_TABLET | Freq: Three times a day (TID) | ORAL | Status: DC | PRN
  Administered 2023-08-15: 25 mg via ORAL
  Filled 2023-08-15 (×2): qty 1

## 2023-08-15 MED ORDER — TOPIRAMATE 25 MG PO TABS
150.0000 mg | ORAL_TABLET | Freq: Every day | ORAL | Status: DC
  Administered 2023-08-15 – 2023-08-29 (×15): 150 mg via ORAL
  Filled 2023-08-15 (×16): qty 2

## 2023-08-15 MED ORDER — PANTOPRAZOLE SODIUM 40 MG PO TBEC
40.0000 mg | DELAYED_RELEASE_TABLET | Freq: Every day | ORAL | Status: DC
  Administered 2023-08-15 – 2023-08-30 (×16): 40 mg via ORAL
  Filled 2023-08-15 (×16): qty 1

## 2023-08-15 MED ORDER — TOPIRAMATE 25 MG PO TABS
150.0000 mg | ORAL_TABLET | Freq: Every day | ORAL | Status: DC
  Filled 2023-08-15: qty 2

## 2023-08-15 MED ORDER — MAGNESIUM HYDROXIDE 400 MG/5ML PO SUSP
15.0000 mL | Freq: Every day | ORAL | Status: DC | PRN
  Administered 2023-08-25: 15 mL via ORAL
  Filled 2023-08-15: qty 30

## 2023-08-15 MED ORDER — POTASSIUM CHLORIDE CRYS ER 20 MEQ PO TBCR
40.0000 meq | EXTENDED_RELEASE_TABLET | Freq: Once | ORAL | Status: AC
  Administered 2023-08-15: 40 meq via ORAL
  Filled 2023-08-15: qty 2

## 2023-08-15 MED ORDER — ESTRADIOL 0.5 MG PO TABS
0.5000 mg | ORAL_TABLET | Freq: Every day | ORAL | Status: DC
  Administered 2023-08-15 – 2023-08-30 (×16): 0.5 mg via ORAL
  Filled 2023-08-15 (×16): qty 1

## 2023-08-15 MED ORDER — HYDROCODONE-ACETAMINOPHEN 5-325 MG PO TABS
1.0000 | ORAL_TABLET | Freq: Four times a day (QID) | ORAL | Status: DC | PRN
  Administered 2023-08-15 – 2023-08-16 (×3): 2 via ORAL
  Administered 2023-08-19 – 2023-08-24 (×3): 1 via ORAL
  Administered 2023-08-24: 2 via ORAL
  Filled 2023-08-15 (×2): qty 1
  Filled 2023-08-15: qty 2
  Filled 2023-08-15: qty 1
  Filled 2023-08-15 (×3): qty 2

## 2023-08-15 MED ORDER — POLYETHYLENE GLYCOL 3350 17 G PO PACK
17.0000 g | PACK | Freq: Every day | ORAL | Status: DC
  Administered 2023-08-15 – 2023-08-26 (×12): 17 g via ORAL
  Filled 2023-08-15 (×12): qty 1

## 2023-08-15 MED ORDER — OXYBUTYNIN CHLORIDE 5 MG PO TABS
5.0000 mg | ORAL_TABLET | Freq: Every day | ORAL | Status: DC
  Administered 2023-08-15 – 2023-08-16 (×2): 5 mg via ORAL
  Filled 2023-08-15 (×2): qty 1

## 2023-08-15 MED ORDER — FLUTICASONE PROPIONATE 50 MCG/ACT NA SUSP
1.0000 | Freq: Every day | NASAL | Status: DC
  Administered 2023-08-15 – 2023-08-30 (×16): 1 via NASAL
  Filled 2023-08-15: qty 16

## 2023-08-15 MED ORDER — GABAPENTIN 100 MG PO CAPS
100.0000 mg | ORAL_CAPSULE | Freq: Two times a day (BID) | ORAL | Status: DC
  Administered 2023-08-15 – 2023-08-16 (×2): 100 mg via ORAL
  Filled 2023-08-15 (×2): qty 1

## 2023-08-15 NOTE — Progress Notes (Signed)
Progress Note   Jocelyn Hooper: Jocelyn Hooper ZOX:[id number] DOB: 1941/03/24 DOA: 08/08/2023     7 DOS: the Jocelyn Hooper was seen and examined on 08/15/2023   Brief hospital course:  Jocelyn Hooper is a 82 y.o. female with medical history significant of iron deficiency anemia, B12 deficiency, arteriosclerotic disease, nephrolithiasis, scoliosis s/p recent intervention (06/09/2023), who presents to the ED due to altered mental status.   History obtained from both Jocelyn Hooper and her family at bedside.  Jocelyn Hooper states that she experiencing worsening of her left hip pain and has been told in the past she has bursitis.  Her husband at bedside states that yesterday, she climbed the stairs numerous times in preparation for the holidays.  Jocelyn Hooper also endorses bladder fullness and feels as though she is unable to urinate.  She denies any dysuria, frequency, urgency or abdominal pain though.  She denies any cough, shortness of breath, chest pain, palpitations, lower extremity swelling.  Jocelyn Hooper states that yesterday, she was in her usual state of health but upon awakening today, he noted that she was more confused and had a fever.  He notes that she has frequent UTIs.   Jocelyn Hooper's daughter at bedside states that Jocelyn Hooper has had occasional memory issues and repeats herself multiple times but it seems much worse today.   ED course: On arrival to the ED, Jocelyn Hooper was hypertensive at 139/85 with heart rate of 117.  She was afebrile at 99.4 but subsequently developed a fever of 101.5.  She was saturating at 99% on room air.  Initial workup notable for WBC of 12.6, sodium of 133, bicarb of 18, creatinine 0.75 with GFR above 60.  Lactic acid within normal limits.  COVID-19 negative.  Urinalysis with ketonuria and rare bacteria.  CT of the abdomen was obtained and results pending.  Chest x-ray was obtained with no active disease.  Jocelyn Hooper was started on IV fluids and Rocephin.  TRH contacted for admission.    Further hospital  course and management as outlined below.   Assessment and Plan:  Sepsis MRSA bacteremia Sepsis POA as evidenced by fever with a Tmax of 101.9, tachycardia, tachypnea, marked leukocytosis with a white count of 18,000 Blood cultures yield gram-positive cocci, BC ID positive for MRSA Unclear source, Jocelyn Hooper with complaints of severe left hip pain and is status post recent L5-S1 decompression, instrumentation and fusion on the Jocelyn Hooper on 06/09/2023.   MRI of the lumbar spine shows status post L5-S1 spinal fusion with a superior endplate compression deformity of the L5 level that is unchanged from 08/08/2023. Eccentric left disc bulges at L4-L5 and L5-S1 with obliteration of the left lateral recesses and severe left neural foraminal narrowing at these levels. Findings have progressed compared to lumbar spine MRI dated 01/11/2023.  MRI of the left hip shows  mild osteoarthritis of the left hip.  No acute findings. Small bilateral hip joint effusions. Mild tendinosis of the bilateral gluteus medius and minimus tendons. Mild right peritrochanteric bursitis. Mild tendinosis of the bilateral hamstring tendon origins.       TEE negative for endocarditis. MRI brain 11/29 negative  11/30 -- repeated MRI lumbar spine w/wo contrast --- source is discitis/osteomyelitis at L4-L5 Discussed findings with on call neurosurgeon here.  Given surgery less than 90 day ago, pt needs evaluation by her surgeon  --Spoke with on-call neurosurgery PA at [facility] who recommended, if pt desires evaluation with Dr. Lovell Sheehan, transfer to hospitalist service and they would consult.  But she stated  management is typically non-surgical with prolonged course IV antibiotics.  --ID is following --Cancel PET scan since we have identified source --Initial repeat Bcx from 11/26 no growth x 4 days --Repeat blood cultures 11/29 showing 1/4 bottles with GPC's  --Continue IV vancomycin --Treat fevers as needed --Pain regimen  increased & resumed IV Fentanyl for breakthrough pain --On gabapentin 300 mg at bedtime --Trial low dose daytime gabapentin 100 mg 0800, 1400, continue 300 mg at bedtime --Titrate gabapentin dose and pain control as needed with goal to minimize oversedation but control pain so pt can tolerate mobility    Acute urinary retention Significant bladder distention noted on CT imaging with Jocelyn Hooper's inability to urinate afterwards.   In-N-Out catheter with 600 cc.  Unclear etiology Foley catheter placed 11/26 --D/C Foley for voiding trial 24-48 hours before discharge   Acute metabolic encephalopathy Secondary to sepsis.  Improved back to baseline. Mentation waxes and wanes with fevers. --Mgmt of infection as above --Delirium precautions --Treat fevers as needed --Monitor & replace electrolytes   Hypophosphatemia - resolved with replacement  Phos 1.0 replaced >> 4.5 >> 2.6 >> 2.7 --Monitor phos levels daily until stable    Hypokalemia - resolved with replacement Supplement potassium   ASCVD (arteriosclerotic cardiovascular disease) Continue home aspirin and metoprolol        Subjective: Pt seen with family at bedside this AM.  She reports dizziness this morning (lightheadedness).  Denies room spinning sensation like vertigo.  Reports feeling hot when temp was 100.4 overnight.  We discussed the MRI findings and source of infection in her back.  Discussed that I reached out to neurosurgery here, since surgery less than 90 days ago, pt needs evaluation by her surgeon.  Pt and family to discuss whether to pursue transfer to [facility], for now want to hold off and find out what Dr. Leretha Pol recommends when he is back tomorrow, he is off call this weekend.   Physical Exam: Vitals:   08/15/23 0431 08/15/23 0657 08/15/23 0751 08/15/23 1300  BP: 120/63  123/61 132/64  Pulse: (!) 116  97 92  Resp: 18  18 16   Temp: (!) 100.4 F (38 C) 97.6 F (36.4 C) 98.2 F (36.8 C) 98.4 F (36.9 C)   TempSrc: Oral Oral  Oral  SpO2: 96%  95% 97%  Weight:      Height:       General exam: awake, alert, no acute distress HEENT: moist mucus membranes, hearing grossly normal Respiratory system: on room air, normal respiratory effort. CTAB no wheezes Cardiovascular system: normal S1/S2, RRR, no pedal edema.   Gastrointestinal system: soft, NT, ND Central nervous system: A&O x. no gross focal neurologic deficits, normal speech Extremities: no edema, normal tone Skin: dry, intact, warm, no rashes seen on visualized skin Psychiatry: normal mood & affect, judgment and insight appear normal     Data Reviewed: Labs reviewed & notable for --   WBC 18.9 >> 18.2 >> 15.3 >> 13.8 >> 13.3 >> 13.5 Hbg 12.1 >> 10.5 >> 9.5 >> 8.9 >> 9.3 >> 9.4  Na 134 >> 132 K 3.4 >> 3.1 Bicarb 18 >> 19 Ca 8.1  Vitamin D normal 47.27   11/28 -- CRP 26.3 / ESR 107  / Lactic acid normal 0.9   11-30 -- MRI lumbar spine w/wo contrast -- shows source of infection discitis/osteomyelitis at L4-L5. IMPRESSION: "1. Partial collapse of the L5 vertebral body, more prominently on the left. 2. Edema and enhancement in the L5 vertebral  body and anterior aspect of the L4 vertebral body on the right. Fluid and mild enhancement is present within the L4-5 disc space. Findings are concerning for discitis/osteomyelitis. 3. Edematous paraspinous soft tissues at the L5 level. 4. Moderate right foraminal stenosis at L3-4 secondary to asymmetric right-sided facet hypertrophy and a rightward disc protrusion. 5. Moderate left foraminal stenosis at L4-5 secondary to asymmetric left-sided facet hypertrophy. 6. Nerve roots are crowded on the right at L1-2 and L2-3 without significant disc protrusion or stenosis"     Procedures --  TEE 11/27 negative for endocarditis, see report. 2D echo 11/28 - EF 55-60%, grade I DD, mild LVH, mild-mod MR   Family Communication: Multiple family members  at bedside on rounds this  AM  Daughter [name] updated 11/30 evening by phone with MRI results showing infection source.   Disposition: Status is: Inpatient Remains inpatient appropriate because: Treatment of MRSA bacteremia, ongoing evaluation, pending cultures.  May require transfer to [facility] for her neurosurgeon to evaluate.    Planned Discharge Destination:  TBD    Time spent: 42 minutes  Author: Pennie Banter, DO 08/15/2023 3:26 PM  For on call review [email or web address].

## 2023-08-15 NOTE — Plan of Care (Signed)

## 2023-08-15 NOTE — Consult Note (Signed)
PHARMACY - PHYSICIAN COMMUNICATION CRITICAL VALUE ALERT - BLOOD CULTURE IDENTIFICATION (BCID)  Jocelyn Hooper is an 82 y.o. female who presented to [facility] on 08/08/2023 with a chief complaint of altered mental status. Patient is currently being treated for MRSA bacteremia with a high bio-burden.   Assessment: 11/30 Blood cultures; 2/4 GPC, BCID no done.  Name of physician (or Provider) Contacted: N/A, results consistent with previous results.  ID following.   Current antibiotics:  Vancomycin 1,250 mg IV Q24H Ceftriaxone 2g IV Q24H   Changes to prescribed antibiotics recommended:  On Vancomycin and ceftriaxone. ID following for further recommendations.  Results for orders placed or performed during the hospital encounter of 08/08/23  Blood Culture ID Panel (Reflexed) (Collected: 08/08/2023 10:53 AM)  Result Value Ref Range   Enterococcus faecalis NOT DETECTED NOT DETECTED   Enterococcus Faecium NOT DETECTED NOT DETECTED   Listeria monocytogenes NOT DETECTED NOT DETECTED   Staphylococcus species DETECTED (A) NOT DETECTED   Staphylococcus aureus (BCID) DETECTED (A) NOT DETECTED   Staphylococcus epidermidis NOT DETECTED NOT DETECTED   Staphylococcus lugdunensis NOT DETECTED NOT DETECTED   Streptococcus species NOT DETECTED NOT DETECTED   Streptococcus agalactiae NOT DETECTED NOT DETECTED   Streptococcus pneumoniae NOT DETECTED NOT DETECTED   Streptococcus pyogenes NOT DETECTED NOT DETECTED   A.calcoaceticus-baumannii NOT DETECTED NOT DETECTED   Bacteroides fragilis NOT DETECTED NOT DETECTED   Enterobacterales NOT DETECTED NOT DETECTED   Enterobacter cloacae complex NOT DETECTED NOT DETECTED   Escherichia coli NOT DETECTED NOT DETECTED   Klebsiella aerogenes NOT DETECTED NOT DETECTED   Klebsiella oxytoca NOT DETECTED NOT DETECTED   Klebsiella pneumoniae NOT DETECTED NOT DETECTED   Proteus species NOT DETECTED NOT DETECTED   Salmonella species NOT DETECTED NOT DETECTED    Serratia marcescens NOT DETECTED NOT DETECTED   Haemophilus influenzae NOT DETECTED NOT DETECTED   Neisseria meningitidis NOT DETECTED NOT DETECTED   Pseudomonas aeruginosa NOT DETECTED NOT DETECTED   Stenotrophomonas maltophilia NOT DETECTED NOT DETECTED   Candida albicans NOT DETECTED NOT DETECTED   Candida auris NOT DETECTED NOT DETECTED   Candida glabrata NOT DETECTED NOT DETECTED   Candida krusei NOT DETECTED NOT DETECTED   Candida parapsilosis NOT DETECTED NOT DETECTED   Candida tropicalis NOT DETECTED NOT DETECTED   Cryptococcus neoformans/gattii NOT DETECTED NOT DETECTED   Meth resistant mecA/C and MREJ DETECTED (A) NOT DETECTED    Otelia Sergeant, PharmD, [facility] 08/15/2023 4:35 AM

## 2023-08-15 NOTE — Progress Notes (Signed)
Date of Admission:  08/08/2023     ID: Jocelyn Hooper is a 82 y.o. female  Principal Problem:   Sepsis with encephalopathy without septic shock (HCC) Active Problems:   Mixed hyperlipidemia   GERD (gastroesophageal reflux disease)   ASCVD (arteriosclerotic cardiovascular disease)   B12 deficiency   Trigeminal neuralgia syndrome   Spondylolisthesis of lumbosacral region   Acute urinary retention   Altered mental status   Bacteremia  On 06/09/23 underwent L5-S1 decompression with fusion at [facility] B/l [facility] ( Left 2013, Rt 2019) Left hip pain- says she has bursitits and gets injection  MRI hip showed bursa in rt peritrochanteric area  Subjective: Pt is feeling okay Fever curve better   Medications:   aspirin EC  81 mg Oral Daily   Chlorhexidine Gluconate Cloth  6 each Topical Daily   docusate sodium  100 mg Oral BID   enoxaparin (LOVENOX) injection  40 mg Subcutaneous Q24H   estradiol  0.5 mg Oral Daily   feeding supplement  237 mL Oral BID BM   fluticasone  1 spray Each Nare Daily   gabapentin  100 mg Oral BID   gabapentin  300 mg Oral QHS   loratadine  10 mg Oral Daily   metoprolol succinate  25 mg Oral Daily   oxybutynin  5 mg Oral Daily   pantoprazole  40 mg Oral Daily   polyethylene glycol  17 g Oral Daily   sodium chloride flush  3 mL Intravenous Q12H   topiramate  150 mg Oral QHS    Objective: Vital signs in last 24 hours: Patient Vitals for the past 24 hrs:  BP Temp Temp src Pulse Resp SpO2  08/15/23 1541 137/70 98 F (36.7 C) Oral 98 16 100 %  08/15/23 1300 132/64 98.4 F (36.9 C) Oral 92 16 97 %  08/15/23 0751 123/61 98.2 F (36.8 C) -- 97 18 95 %  08/15/23 0657 -- 97.6 F (36.4 C) Oral -- -- --  08/15/23 0431 120/63 (!) 100.4 F (38 C) Oral (!) 116 18 96 %  08/14/23 2307 133/63 98.3 F (36.8 C) Oral -- 17 99 %  08/14/23 2100 (!) 157/95 98.8 F (37.1 C) Oral (!) 102 -- 98 %  08/14/23 1800 111/69 99.1 F (37.3 C) Axillary 93 -- 98 %      LDA Foley   PHYSICAL EXAM:  General: Alert, cooperative, no distress, appears stated age.  Lumbar surgical scar- has a scab Lungs: Clear to auscultation bilaterally. No Wheezing or Rhonchi. No rales. Heart: Regular rate and rhythm, no murmur, rub or gallop. Abdomen: Soft, non-tender,not distended. Bowel sounds normal. No masses Extremities: atraumatic, no cyanosis. No edema. No clubbing B/l knee surgical scars foley Skin: No rashes or lesions. Or bruising Lymph: Cervical, supraclavicular normal. Neurologic: Grossly non-focal  Lab Results    Latest Ref Rng & Units 08/15/2023    4:53 AM 08/14/2023    5:25 AM 08/13/2023    3:43 AM  CBC  WBC 4.0 - 10.5 K/uL 13.5  13.3  13.8   Hemoglobin 12.0 - 15.0 g/dL 9.4  9.3  8.9   Hematocrit 36.0 - 46.0 % 27.8  27.0  26.4   Platelets 150 - 400 K/uL 301  224  165        Latest Ref Rng & Units 08/15/2023    4:53 AM 08/14/2023    5:25 AM 08/13/2023    3:43 AM  CMP  Glucose 70 - 99 mg/dL 99  106  133   BUN 8 - 23 mg/dL 15  16  18    Creatinine 0.44 - 1.00 mg/dL 2.83  1.51  7.61   Sodium 135 - 145 mmol/L 132  134  134   Potassium 3.5 - 5.1 mmol/L 3.1  3.4  3.6   Chloride 98 - 111 mmol/L 104  108  111   CO2 22 - 32 mmol/L 19  18  17    Calcium 8.9 - 10.3 mg/dL 8.1  8.3  8.0       Microbiology: 08/08/23- 4/4 MRSA bacteremia 08/10/23-BC -   Studies/Results: MR Lumbar Spine W Wo Contrast  Result Date: 08/14/2023 CLINICAL DATA:  MRSA bacteremia. Low back pain. Lumbar spine surgery weeks ago. EXAM: MRI LUMBAR SPINE WITHOUT AND WITH CONTRAST TECHNIQUE: Multiplanar and multiecho pulse sequences of the lumbar spine were obtained without and with intravenous contrast. CONTRAST:  5mL GADAVIST GADOBUTROL 1 MMOL/ML IV SOLN COMPARISON:  MRI of lumbar spine without contrast 08/09/2023. Intraoperative lumbar spine radiographs 06/09/2023. FINDINGS: Segmentation: 5 non rib-bearing lumbar type vertebral bodies are present. The lowest fully formed  vertebral body is L5. Alignment: Slight degenerative retrolisthesis is present at L2-3 and L3-4. Lumbar lordosis is preserved. Levoconvex curvature is centered at L3. Vertebrae: Type 2 Modic changes are present on the right at L2-3 and L3-4. Marrow signal is somewhat distorted about the hardware at L5 and S1. Partial collapse of the L5 vertebral body is noted, more prominently on the left. Edema and enhancement is present in the L5 vertebral body and anterior aspect of the L4 vertebral body on the right. Fluid and mild enhancement is present within the L4-5 disc space. Abnormal enhancement is present in the upper lumbar spine. Conus medullaris and cauda equina: Conus extends to the T12-L1 level. Conus and cauda equina appear normal. Paraspinal and other soft tissues: Edematous paraspinous soft tissues are present at the L5 level. Disc levels: L1-2: Nerve roots are crowded on the right. No significant disc protrusion or stenosis is present. L2-3: Nerve roots are crowded on the right. No focal disc protrusion or stenosis is present. L3-4: Asymmetric right-sided facet hypertrophy is present. A rightward disc protrusion is present. Central canal is patent. Moderate right foraminal stenosis is present. L4-5: Asymmetric left-sided facet hypertrophy is present. Moderate left foraminal stenosis is present. L5-S1: The left foramen is obscured by hardware. No residual or recurrent stenosis is present. Left laminectomy is noted. IMPRESSION: 1. Partial collapse of the L5 vertebral body, more prominently on the left. 2. Edema and enhancement in the L5 vertebral body and anterior aspect of the L4 vertebral body on the right. Fluid and mild enhancement is present within the L4-5 disc space. Findings are concerning for discitis/osteomyelitis. 3. Edematous paraspinous soft tissues at the L5 level. 4. Moderate right foraminal stenosis at L3-4 secondary to asymmetric right-sided facet hypertrophy and a rightward disc protrusion. 5.  Moderate left foraminal stenosis at L4-5 secondary to asymmetric left-sided facet hypertrophy. 6. Nerve roots are crowded on the right at L1-2 and L2-3 without significant disc protrusion or stenosis. Electronically Signed   By: Marin Roberts M.D.   On: 08/14/2023 17:09     Assessment/Plan: MRSA bacteremia High bio-burden Source- discitis/osteo at l5-l4 area Left hip pain- MRI without contrast no obvious abnormality-  Recent spine fusion ( sept 2024) TEE no endocarditis Leucocytosis- improving Pt is currently on vanco- dose is being adjusted depending on level- will change to dapto starting tomorrow  MRI brain small vessel disease Repeat MRI  lumbar spine- discitis L4/L5, edematous paraspinal tissue at l5 Repeat blood culture from 11/29 positive for gram positive cocci Repeat blood culture until clear of bacteremia PICC cannot be placed until then   Encephalopathy- resolved  Urinary retention- has foley Because of recurrent fever ceftriaxone was added- will DC    Discussed with patient, family and hospitalist

## 2023-08-16 ENCOUNTER — Other Ambulatory Visit: Payer: PPO

## 2023-08-16 DIAGNOSIS — B9562 Methicillin resistant Staphylococcus aureus infection as the cause of diseases classified elsewhere: Secondary | ICD-10-CM | POA: Diagnosis not present

## 2023-08-16 DIAGNOSIS — G9341 Metabolic encephalopathy: Secondary | ICD-10-CM | POA: Diagnosis not present

## 2023-08-16 DIAGNOSIS — A4102 Sepsis due to Methicillin resistant Staphylococcus aureus: Secondary | ICD-10-CM | POA: Diagnosis not present

## 2023-08-16 DIAGNOSIS — R6520 Severe sepsis without septic shock: Secondary | ICD-10-CM | POA: Diagnosis not present

## 2023-08-16 DIAGNOSIS — R338 Other retention of urine: Secondary | ICD-10-CM | POA: Diagnosis not present

## 2023-08-16 DIAGNOSIS — M4646 Discitis, unspecified, lumbar region: Secondary | ICD-10-CM | POA: Diagnosis not present

## 2023-08-16 DIAGNOSIS — R7881 Bacteremia: Secondary | ICD-10-CM | POA: Diagnosis not present

## 2023-08-16 DIAGNOSIS — R52 Pain, unspecified: Secondary | ICD-10-CM | POA: Diagnosis not present

## 2023-08-16 LAB — CULTURE, BLOOD (ROUTINE X 2): Special Requests: ADEQUATE

## 2023-08-16 LAB — BASIC METABOLIC PANEL
Anion gap: 6 (ref 5–15)
BUN: 16 mg/dL (ref 8–23)
CO2: 20 mmol/L — ABNORMAL LOW (ref 22–32)
Calcium: 8.3 mg/dL — ABNORMAL LOW (ref 8.9–10.3)
Chloride: 105 mmol/L (ref 98–111)
Creatinine, Ser: 0.68 mg/dL (ref 0.44–1.00)
GFR, Estimated: >60 mL/min (ref >60)
Glucose, Bld: 134 mg/dL — ABNORMAL HIGH (ref 70–99)
Potassium: 3.5 mmol/L (ref 3.5–5.1)
Sodium: 131 mmol/L — ABNORMAL LOW (ref 135–145)

## 2023-08-16 LAB — CBC
HCT: 27.2 % — ABNORMAL LOW (ref 36.0–46.0)
Hemoglobin: 9.3 g/dL — ABNORMAL LOW (ref 12.0–15.0)
MCH: 31.4 pg (ref 26.0–34.0)
MCHC: 34.2 g/dL (ref 30.0–36.0)
MCV: 91.9 fL (ref 80.0–100.0)
Platelets: 389 10*3/uL (ref 150–400)
RBC: 2.96 MIL/uL — ABNORMAL LOW (ref 3.87–5.11)
RDW: 14.6 % (ref 11.5–15.5)
WBC: 14.5 10*3/uL — ABNORMAL HIGH (ref 4.0–10.5)
nRBC: 0 % (ref 0.0–0.2)

## 2023-08-16 MED ORDER — SODIUM CHLORIDE 0.9 % IV SOLN
600.0000 mg | Freq: Three times a day (TID) | INTRAVENOUS | Status: DC
  Administered 2023-08-16 – 2023-08-23 (×21): 600 mg via INTRAVENOUS
  Filled 2023-08-16 (×23): qty 20

## 2023-08-16 MED ORDER — DAPTOMYCIN-SODIUM CHLORIDE 500-0.9 MG/50ML-% IV SOLN
9.0000 mg/kg | Freq: Every day | INTRAVENOUS | Status: DC
  Administered 2023-08-16 – 2023-08-20 (×5): 500 mg via INTRAVENOUS
  Filled 2023-08-16 (×7): qty 50

## 2023-08-16 MED ORDER — GABAPENTIN 300 MG PO CAPS
300.0000 mg | ORAL_CAPSULE | Freq: Three times a day (TID) | ORAL | Status: DC
  Administered 2023-08-16 – 2023-08-30 (×40): 300 mg via ORAL
  Filled 2023-08-16 (×42): qty 1

## 2023-08-16 MED ORDER — MORPHINE SULFATE ER 15 MG PO TBCR
15.0000 mg | EXTENDED_RELEASE_TABLET | Freq: Two times a day (BID) | ORAL | Status: DC
  Administered 2023-08-16 – 2023-08-23 (×14): 15 mg via ORAL
  Filled 2023-08-16 (×14): qty 1

## 2023-08-16 NOTE — Plan of Care (Signed)
  Problem: Clinical Measurements: Goal: Cardiovascular complication will be avoided Outcome: Progressing   Problem: Activity: Goal: Risk for activity intolerance will decrease Outcome: Not Progressing   Problem: Pain Management: Goal: General experience of comfort will improve Outcome: Not Progressing   Problem: Safety: Goal: Ability to remain free from injury will improve Outcome: Progressing

## 2023-08-16 NOTE — Progress Notes (Signed)
Date of Admission:  08/08/2023     ID: Jocelyn Hooper is a 82 y.o. female  Principal Problem:   Sepsis with encephalopathy without septic shock (HCC) Active Problems:   Mixed hyperlipidemia   GERD (gastroesophageal reflux disease)   ASCVD (arteriosclerotic cardiovascular disease)   B12 deficiency   Trigeminal neuralgia syndrome   Spondylolisthesis of lumbosacral region   Acute urinary retention   Altered mental status   MRSA bacteremia   Discitis of lumbar region  On 06/09/23 underwent L5-S1 decompression with fusion at [facility] B/l TKA ( Left 2013, Rt 2019) Left hip pain- says she has bursitits and gets injection  MRI hip showed bursa in rt peritrochanteric area  Subjective: Is continuing to have fever   Medications:   aspirin EC  81 mg Oral Daily   Chlorhexidine Gluconate Cloth  6 each Topical Daily   docusate sodium  100 mg Oral BID   enoxaparin (LOVENOX) injection  40 mg Subcutaneous Q24H   estradiol  0.5 mg Oral Daily   feeding supplement  237 mL Oral BID BM   fluticasone  1 spray Each Nare Daily   gabapentin  300 mg Oral TID   loratadine  10 mg Oral Daily   metoprolol succinate  25 mg Oral Daily   pantoprazole  40 mg Oral Daily   polyethylene glycol  17 g Oral Daily   sodium chloride flush  3 mL Intravenous Q12H   topiramate  150 mg Oral QHS    Objective: Vital signs in last 24 hours: Patient Vitals for the past 24 hrs:  BP Temp Temp src Pulse Resp SpO2  08/16/23 1215 109/62 98.6 F (37 C) Oral 87 -- 98 %  08/16/23 1214 109/62 98.6 F (37 C) Oral 88 -- 99 %  08/16/23 1037 -- -- -- -- 20 --  08/16/23 0951 -- -- -- -- 18 --  08/16/23 0831 (!) 84/73 97.8 F (36.6 C) Oral -- -- 99 %  08/16/23 0630 (!) 89/59 98.1 F (36.7 C) Oral 75 15 99 %  08/16/23 0600 111/60 98.4 F (36.9 C) Oral 78 14 98 %  08/16/23 0500 112/61 98.4 F (36.9 C) Oral 91 18 98 %  08/16/23 0430 112/79 98.4 F (36.9 C) Oral 90 20 96 %  08/16/23 0400 (!) 140/72 98.5 F (36.9 C) Oral 100 20  97 %  08/16/23 0345 119/62 98.5 F (36.9 C) Oral (!) 101 15 97 %  08/16/23 0330 114/62 98.5 F (36.9 C) Oral (!) 102 20 97 %  08/16/23 0315 (!) 116/58 -- -- (!) 102 20 97 %  08/16/23 0300 122/60 (!) 101.8 F (38.8 C) Oral (!) 110 20 95 %  08/16/23 0247 120/64 (!) 102 F (38.9 C) Oral (!) 110 20 96 %  08/16/23 0232 132/65 (!) 102 F (38.9 C) Oral (!) 112 18 94 %  08/15/23 1949 (!) 117/57 99.5 F (37.5 C) Oral 86 18 100 %  08/15/23 1715 -- 98.2 F (36.8 C) Oral -- -- --  08/15/23 1541 137/70 98 F (36.7 C) Oral 98 16 100 %  08/15/23 1300 132/64 98.4 F (36.9 C) Oral 92 16 97 %     LDA Foley   PHYSICAL EXAM:  General: Alert, cooperative, no distress, appears stated age.  Lumbar surgical scar- has a scab Lungs: Clear to auscultation bilaterally. No Wheezing or Rhonchi. No rales. Heart: Regular rate and rhythm, no murmur, rub or gallop. Abdomen: Soft, non-tender,not distended. Bowel sounds normal. No masses Extremities:  atraumatic, no cyanosis. No edema. No clubbing B/l knee surgical scars foley Skin: No rashes or lesions. Or bruising Lymph: Cervical, supraclavicular normal. Neurologic: Grossly non-focal  Lab Results    Latest Ref Rng & Units 08/16/2023    4:32 AM 08/15/2023    4:53 AM 08/14/2023    5:25 AM  CBC  WBC 4.0 - 10.5 K/uL 14.5  13.5  13.3   Hemoglobin 12.0 - 15.0 g/dL 9.3  9.4  9.3   Hematocrit 36.0 - 46.0 % 27.2  27.8  27.0   Platelets 150 - 400 K/uL 389  301  224        Latest Ref Rng & Units 08/16/2023    4:32 AM 08/15/2023    4:53 AM 08/14/2023    5:25 AM  CMP  Glucose 70 - 99 mg/dL 956  99  213   BUN 8 - 23 mg/dL 16  15  16    Creatinine 0.44 - 1.00 mg/dL 0.86  5.78  4.69   Sodium 135 - 145 mmol/L 131  132  134   Potassium 3.5 - 5.1 mmol/L 3.5  3.1  3.4   Chloride 98 - 111 mmol/L 105  104  108   CO2 22 - 32 mmol/L 20  19  18    Calcium 8.9 - 10.3 mg/dL 8.3  8.1  8.3       Microbiology: 08/08/23- 4/4 MRSA bacteremia 08/10/23-BC -    Studies/Results: MR Lumbar Spine W Wo Contrast  Result Date: 08/14/2023 CLINICAL DATA:  MRSA bacteremia. Low back pain. Lumbar spine surgery weeks ago. EXAM: MRI LUMBAR SPINE WITHOUT AND WITH CONTRAST TECHNIQUE: Multiplanar and multiecho pulse sequences of the lumbar spine were obtained without and with intravenous contrast. CONTRAST:  5mL GADAVIST GADOBUTROL 1 MMOL/ML IV SOLN COMPARISON:  MRI of lumbar spine without contrast 08/09/2023. Intraoperative lumbar spine radiographs 06/09/2023. FINDINGS: Segmentation: 5 non rib-bearing lumbar type vertebral bodies are present. The lowest fully formed vertebral body is L5. Alignment: Slight degenerative retrolisthesis is present at L2-3 and L3-4. Lumbar lordosis is preserved. Levoconvex curvature is centered at L3. Vertebrae: Type 2 Modic changes are present on the right at L2-3 and L3-4. Marrow signal is somewhat distorted about the hardware at L5 and S1. Partial collapse of the L5 vertebral body is noted, more prominently on the left. Edema and enhancement is present in the L5 vertebral body and anterior aspect of the L4 vertebral body on the right. Fluid and mild enhancement is present within the L4-5 disc space. Abnormal enhancement is present in the upper lumbar spine. Conus medullaris and cauda equina: Conus extends to the T12-L1 level. Conus and cauda equina appear normal. Paraspinal and other soft tissues: Edematous paraspinous soft tissues are present at the L5 level. Disc levels: L1-2: Nerve roots are crowded on the right. No significant disc protrusion or stenosis is present. L2-3: Nerve roots are crowded on the right. No focal disc protrusion or stenosis is present. L3-4: Asymmetric right-sided facet hypertrophy is present. A rightward disc protrusion is present. Central canal is patent. Moderate right foraminal stenosis is present. L4-5: Asymmetric left-sided facet hypertrophy is present. Moderate left foraminal stenosis is present. L5-S1: The left  foramen is obscured by hardware. No residual or recurrent stenosis is present. Left laminectomy is noted. IMPRESSION: 1. Partial collapse of the L5 vertebral body, more prominently on the left. 2. Edema and enhancement in the L5 vertebral body and anterior aspect of the L4 vertebral body on the right. Fluid and mild enhancement is  present within the L4-5 disc space. Findings are concerning for discitis/osteomyelitis. 3. Edematous paraspinous soft tissues at the L5 level. 4. Moderate right foraminal stenosis at L3-4 secondary to asymmetric right-sided facet hypertrophy and a rightward disc protrusion. 5. Moderate left foraminal stenosis at L4-5 secondary to asymmetric left-sided facet hypertrophy. 6. Nerve roots are crowded on the right at L1-2 and L2-3 without significant disc protrusion or stenosis. Electronically Signed   By: Marin Roberts M.D.   On: 08/14/2023 17:09     Assessment/Plan: MRSA bacteremia Recent spine fusion ( sept 2024) High bio-burden Source- discitis/osteo at l5-l4 area Left hip pain- MRI without contrast no obvious abnormality-   TEE no endocarditis Leucocytosis- Pt is currently on vanco- dose is being adjusted depending on level- will change to dapto and ceftaroline 9 Dual MRSA covergae) because of persistent fever and bacteremia  MRI brain small vessel disease Repeat MRI lumbar spine- discitis L4/L5, edematous paraspinal tissue at l5 Repeat blood culture from 11/29 positive for gram positive cocci Repeat blood culture until clear of bacteremia PICC cannot be placed until then   Encephalopathy- resolved  Urinary retention- has foley Because of recurrent fever ceftriaxone was added- will DC    Discussed with patient, family and hospitalist

## 2023-08-16 NOTE — Progress Notes (Signed)
Pharmacy Antibiotic Note  Jocelyn Hooper is a 82 y.o. female admitted on 08/08/2023 with bacteremia.  Pharmacy has been consulted for daptomycin dosing. Recently lumbar fusion in sept.  Treated for UTI in October (no cultures available, only UAs).    Today, 08/16/2023 Day # 8 vancomycin to daptomycin + ceftaroline Renal: SCr 0.66 WBC 13.8 Tm 103.5 11/24 blood cx: MRSA 11/25 MRI lumbar spine - no evidence of infection 11/25 MRI for Hip pain - Mild osteoarthritis of the left hip. No acute findings; Small bilateral hip joint effusions; Mild tendinosis of the bilateral gluteus medius and minimus tendons. Mild right peritrochanteric bursitis; Mild tendinosis of the bilateral hamstring tendon origins  11/26 blood cx: NG 11/29 blood cx: both sets MRSA 11/28 TEE - no vegetation 11/30 repeat MRI - concern for L4-5 discitis/osteomyelitis 12/2 blood cx: pending  Assessment: Vancomycin levels 11/27-28 Current regimen: Vancomycin 750 mg IV Q24H Preceding dose 11/27 @ 2118  Vanc pk 11/28 @ 0022: 16 ug/mL  Vanc tr 11/28 @ 1943: 8 ug/mL PK Calculations AUC: 282.4 Cmin: 7.6 T 1/2: 19.4hr  Plan: Due to persistent bacteremia and fever, changing vancomycin to daptomycin 500mg  IV q24h (9mg /kg) with addition of ceftaroline 600mg  IV q8h (will dose q8h as normalized CrCl and eGFR are > 9ml/min).  CK in am then weekly, not on statin Follow renal function and CBC ID following  Height: 5\' 5"  (165.1 cm) Weight: 56 kg (123 lb 7.3 oz) IBW/kg (Calculated) : 57  Temp (24hrs), Avg:99.1 F (37.3 C), Min:97.8 F (36.6 C), Max:102 F (38.9 C)  Recent Labs  Lab 08/09/23 1754 08/09/23 2000 08/10/23 0529 08/12/23 0022 08/12/23 1943 08/12/23 2055 08/13/23 0343 08/14/23 0525 08/15/23 0453 08/16/23 0432  WBC  --   --    < > 15.3*  --  13.7* 13.8* 13.3* 13.5* 14.5*  CREATININE  --   --    < > 0.61  --  0.64 0.66 0.79 0.68 0.68  LATICACIDVEN 2.8* 1.3  --  0.9  --  0.8  --   --   --   --   VANCOTROUGH   --   --   --   --  8*  --   --   --   --   --   VANCOPEAK  --   --   --  16*  --   --   --   --   --   --    < > = values in this interval not displayed.    Estimated Creatinine Clearance: 46.3 mL/min (by C-G formula based on SCr of 0.68 mg/dL).    Allergies  Allergen Reactions   Avelox [Moxifloxacin Hcl In Nacl] Other (See Comments)    Redness all over in about 5 minutes after taking   Codeine Nausea Only   Acyclovir And Related     Turn red all over    Shrimp [Shellfish Allergy]     Hives     Ciprofloxacin Rash   Moxifloxacin Hives and Rash   Sucralfate Rash    Itching     Antimicrobials this admission: 11/27 ceftriaxone X 1, 11/29-12/1 11/25 vancomycin >>  Dose adjustments this admission:   Microbiology results:  11/24 UCx:  Multiple species present, suggest re-collection See above   Thank you for allowing pharmacy to be a part of this patient's care.  Juliette Alcide, PharmD, BCPS, BCIDP Work Cell: [phone] 08/16/2023 10:55 AM

## 2023-08-16 NOTE — Progress Notes (Addendum)
Progress Note   Patient: Jocelyn Hooper:[id number] DOB: [birth year] DOA: 08/08/2023     8 DOS: the patient was seen and examined on 08/16/2023   Brief hospital course: HPI on admission 08/08/2023: "Jocelyn Hooper is a 83 y.o. female with medical history significant of iron deficiency anemia, B12 deficiency, arteriosclerotic disease, nephrolithiasis, scoliosis s/p recent intervention (06/09/2023), who presents to the ED due to altered mental status.   History obtained from both patient and her family at bedside.  [name] states that she experiencing worsening of her left hip pain and has been told in the past she has bursitis.  Her husband at bedside states that yesterday, she climbed the stairs numerous times in preparation for the holidays.  [name] also endorses bladder fullness and feels as though she is unable to urinate.  She denies any dysuria, frequency, urgency or abdominal pain though.  She denies any cough, shortness of breath, chest pain, palpitations, lower extremity swelling.  [name] states that yesterday, she was in her usual state of health but upon awakening today, he noted that she was more confused and had a fever.  He notes that she has frequent UTIs.   [name] at bedside states that patient has had occasional memory issues and repeats herself multiple times but it seems much worse today.   ED course: On arrival to the ED, patient was hypertensive at 139/85 with heart rate of 117.  She was afebrile at 99.4 but subsequently developed a fever of 101.5.  She was saturating at 99% on room air.  Initial workup notable for WBC of 12.6, sodium of 133, bicarb of 18, creatinine 0.75 with GFR above 60.  Lactic acid within normal limits.  COVID-19 negative.  Urinalysis with ketonuria and rare bacteria.  CT of the abdomen was obtained and results pending.  Chest x-ray was obtained with no active disease.  Patient was started on IV fluids and Rocephin.  TRH contacted for  admission."   Patient was found to have MRSA bacteremia. Started on IV Vancomycin and Infectious Disease consulted.   Initial evaluation for infection source including MRI's of lumbar spine and hip and TEE were all negative.  Pt continued to spike fevers and MRI lumbar spine was repeated with contrast on 11/30 showing findings of discitis/osteomyelitis at L4-L5, but no drain-able fluid collections seen.   Case discussed with Dr. Lovell Sheehan, patient's Neurosurgeon on 08/16/23.  He recommended continued IV antibiotic therapy.  There is no current indication for any surgery which would be a last resort or if neurologic compromise.  Further hospital course and management as outlined below.   Assessment and Plan:  Sepsis MRSA bacteremia Sepsis POA as evidenced by fevers, tachycardia, tachypnea, leukocytosis. Blood cultures +MRSA in all 4 bottles. Patient presented with complaints of severe left hip pain and is status post recent L5-S1 decompression, instrumentation and fusion on 06/09/2023.   Initial MRI of the lumbar spine without contrast had no findings to suggest infection, see report.   MRI of the left hip showed  mild OA, no acute findings, small bilateral hip joint effusions, mild tendinosis of the bilateral gluteus medius and minimus tendons. Mild right peritrochanteric bursitis. Mild tendinosis of the bilateral hamstring tendon origins.       11/28 -- TEE negative for endocarditis. 11/29 -- MRI brain negative  11/30 -- repeated MRI lumbar spine w/wo contrast --- shows discitis/osteomyelitis at L4-L5. Other findings as before, see report.  12/2 -- having persistent fevers Tmax 102  overnight  --ID is following --Initially on IV Vancomycin --Now on IV Daptomycin and ceftaroline --Cancelled PET scan since identified source --Initial repeat Bcx from 11/26 no growth & final --Repeat blood cultures 11/29 again +MRSA  --Treat fevers as needed  Inadequate Pain Control -- pt becomes too  confused wit oxycodone.  Norco has been ineffective.   Discussed options with pt's daughter and since pt not having nausea with hydrocodone or when oxycodone was tried, will trial lowest dose of long-acting morphine and monitor (pt has nausea with codeine on allergy profile) --Start MS Contin 15 mg BID --Palliative care consulted for assistant -- Palliative consulted for assistance --Titrate regimen --Increase gabapentin to 300 TID instead of just bedtime, tolerated 100 mg day times doses yesterday --Monitor for oversedation or confusion --Goal of adequate pain control for mobility, pt was ambulating with walker just prior to admission   Acute urinary retention Significant bladder distention noted on CT imaging with patient's inability to urinate afterwards.   In-N-Out catheter with 600 cc.  Unclear etiology Foley catheter placed 11/26 --Hold oxybutynin for now --Continue Foley for now given severe pain, voiding trial once pain control improved   Acute metabolic encephalopathy Secondary to sepsis.  Improved back to baseline. Mentation waxes and wanes with fevers. --Mgmt of infection as above --Delirium precautions --Treat fevers as needed --Monitor & replace electrolytes   Hypophosphatemia - resolved with replacement  Phos 1.0 replaced >> 4.5 >> 2.6 >> 2.7 --Monitor phos levels    Hypokalemia - resolved with replacement Supplement potassium   ASCVD (arteriosclerotic cardiovascular disease) Continue home aspirin and metoprolol        Subjective: Pt resting awake in bed this AM. Reports back pain about the same, remains quite severe.    Physical Exam: Vitals:   08/16/23 1037 08/16/23 1214 08/16/23 1215 08/16/23 1557  BP:  109/62 109/62 120/64  Pulse:  88 87 84  Resp: 20     Temp:  98.6 F (37 C) 98.6 F (37 C) 98.5 F (36.9 C)  TempSrc:  Oral Oral   SpO2:  99% 98% 100%  Weight:      Height:       General exam: awake, appears drowsy, no acute distress HEENT:  moist mucus membranes, hearing grossly normal Respiratory system: CTAB no wheezes or rhonchi on room air normal respiratory effort Cardiovascular system: RRR, no murmurs, no peripheral edema Gastrointestinal system: soft, NT, ND Central nervous system: A&O x. no gross focal neurologic deficits, normal speech Extremities: no edema, normal tone Skin: dry, intact, no rashes on visualized skin Psychiatry: normal mood & affect, judgment and insight appear normal     Data Reviewed: Labs reviewed & notable for --   WBC 13.5 >> 14.5 Hbg 9.3 >> 9.4 >> 9.3 stable  Na 134 >> 132 >> 131 Glucose 134 Bicarb 18 >> 19 >> 20 Ca 8.3    11-30 -- MRI lumbar spine w/wo contrast -- shows source of infection discitis/osteomyelitis at L4-L5. IMPRESSION: "1. Partial collapse of the L5 vertebral body, more prominently on the left. 2. Edema and enhancement in the L5 vertebral body and anterior aspect of the L4 vertebral body on the right. Fluid and mild enhancement is present within the L4-5 disc space. Findings are concerning for discitis/osteomyelitis. 3. Edematous paraspinous soft tissues at the L5 level. 4. Moderate right foraminal stenosis at L3-4 secondary to asymmetric right-sided facet hypertrophy and a rightward disc protrusion. 5. Moderate left foraminal stenosis at L4-5 secondary to asymmetric left-sided facet hypertrophy.  6. Nerve roots are crowded on the right at L1-2 and L2-3 without significant disc protrusion or stenosis"     Procedures --  TEE 11/27 negative for endocarditis, see report. 2D echo 11/28 - EF 55-60%, grade I DD, mild LVH, mild-mod MR   Family Communication:  family members  at bedside on rounds this AM  Daughter [name] updated this evening by phone after speaking with Dr. Lovell Sheehan.   Disposition: Status is: Inpatient Remains inpatient appropriate because: Treatment of MRSA bacteremia, ongoing evaluation, still spiking fevers.    Planned Discharge Destination:   TBD    Time spent: 42 minutes  Author: Pennie Banter, DO 08/16/2023 5:53 PM  For on call review [email or web address].

## 2023-08-16 NOTE — Progress Notes (Signed)
Physical Therapy Treatment Patient Details Name: Jocelyn Hooper MRN: [id number] DOB: [birth year] Today's Date: 08/16/2023   History of Present Illness Pt is an 82 yo F presenting due to AMS, with MRSA, sepsis, acute urinary retention, acute metabolic encephalopathy, arteriosclerotic CVD, and hypokalemia. PMH significant for L5-S1 TLIF (06/09/2023), anemia, L shoulder bursitis, CAD, CSF leak, IBS, Scoliosis, Trigeminal neuralgia, and B TKR.    PT Comments  Pt was pleasant, although confused/lethargic and very limited with mobility this session. Attempted exercises in supine, however limited by pt lethargy and lack of understanding. Pt performed supine>sit with +2 max assist, exhibiting limited strength and decreased understanding despite heavy cuing. Pt was unable to sit EOB without assist due to heavy posterior lean, heavy multimodal cuing given without success. Opted for +2 total assist for sit>supine for pt safety. Pt's vitals were monitored during session, HR max noted at 120 with sitting EOB, pt reporting no adverse symptoms other than pain throughout session. Pt will benefit from continued PT services upon discharge to safely address deficits listed in patient problem list for decreased caregiver assistance and eventual return to PLOF.      If plan is discharge home, recommend the following: Assistance with cooking/housework;Help with stairs or ramp for entrance;Supervision due to cognitive status;Assist for transportation;Direct supervision/assist for financial management;A lot of help with walking and/or transfers;A lot of help with bathing/dressing/bathroom   Can travel by private vehicle     No  Equipment Recommendations  Other (comment) (TBD at next venue of care)    Recommendations for Other Services       Precautions / Restrictions Precautions Precautions: Fall Restrictions Weight Bearing Restrictions: No Other Position/Activity Restrictions: Monitor HR    Mobility  Bed  Mobility Overal bed mobility: Needs Assistance Bed Mobility: Supine to Sit, Sit to Supine, Rolling     Supine to sit: Max assist, +2 for physical assistance, HOB elevated Sit to supine: +2 for physical assistance, Total assist   General bed mobility comments: attempted rolling for log roll technique but deferred 2/2 L hip pain; pt able to initiate some small movements with BLEs, BUEs but overall weak and not following commands despite heavy multimodal cuing and encouragement for sequencing, hand/feet placement; opted for +2 total sit>supine for pt safety    Transfers                   General transfer comment: unable 2/2 pt exhibiting strong posterior lean; unable to correct despite heavy multimodal cuing for upright seated posture/use of BUEs    Ambulation/Gait                   Stairs             Wheelchair Mobility     Tilt Bed    Modified Rankin (Stroke Patients Only)       Balance Overall balance assessment: Needs assistance Sitting-balance support: Feet supported, Bilateral upper extremity supported Sitting balance-Leahy Scale: Poor Sitting balance - Comments: unable to static sit without physical assist at trunk due to posterior lean despite heavy multimodal cuing to correct Postural control: Posterior lean     Standing balance comment: n/a                            Cognition Arousal: Lethargic Behavior During Therapy: Flat affect Overall Cognitive Status: Impaired/Different from baseline Area of Impairment: Attention, Following commands, Awareness  Following Commands: Follows one step commands inconsistently       General Comments: Pt confused, intermittently answering questions/following commands. Occasionally exhibiting full comprehension        Exercises Total Joint Exercises Ankle Circles/Pumps: AROM, Both, 10 reps, Supine, Other (comment) (very small ROM, pt exhibiting decreased  understanding)    General Comments        Pertinent Vitals/Pain Pain Assessment Pain Assessment: Faces Faces Pain Scale: Hurts little more Pain Location: back, L hip Pain Descriptors / Indicators: Grimacing, Discomfort Pain Intervention(s): Monitored during session, RN gave pain meds during session, Repositioned    Home Living                          Prior Function            PT Goals (current goals can now be found in the care plan section) Acute Rehab PT Goals Patient Stated Goal: get back to walking PT Goal Formulation: With family Time For Goal Achievement: 08/24/23 Potential to Achieve Goals: Fair Progress towards PT goals: Progressing toward goals    Frequency    Min 1X/week      PT Plan      Co-evaluation              AM-PAC PT "6 Clicks" Mobility   Outcome Measure  Help needed turning from your back to your side while in a flat bed without using bedrails?: A Lot Help needed moving from lying on your back to sitting on the side of a flat bed without using bedrails?: A Lot Help needed moving to and from a bed to a chair (including a wheelchair)?: Total Help needed standing up from a chair using your arms (e.g., wheelchair or bedside chair)?: A Lot Help needed to walk in hospital room?: A Lot Help needed climbing 3-5 steps with a railing? : Total 6 Click Score: 10    End of Session   Activity Tolerance: Other (comment) (pt limited by cognition, also inability to sit without physical assist) Patient left: in bed;with call bell/phone within reach;with bed alarm set;with family/visitor present Nurse Communication: Mobility status; Max HR 120 with mobility; Lethargy/decreased awareness compared to prior session PT Visit Diagnosis: Difficulty in walking, not elsewhere classified (R26.2);Muscle weakness (generalized) (M62.81);Pain Pain - Right/Left: Left Pain - part of body: Hip     Time: 0865-7846 PT Time Calculation (min) (ACUTE ONLY):  23 min  Charges:                           Rosiland Oz SPT 08/16/23, 5:33 PM

## 2023-08-17 DIAGNOSIS — M4626 Osteomyelitis of vertebra, lumbar region: Secondary | ICD-10-CM

## 2023-08-17 DIAGNOSIS — G9341 Metabolic encephalopathy: Secondary | ICD-10-CM | POA: Diagnosis not present

## 2023-08-17 DIAGNOSIS — R52 Pain, unspecified: Secondary | ICD-10-CM | POA: Diagnosis not present

## 2023-08-17 DIAGNOSIS — M4646 Discitis, unspecified, lumbar region: Secondary | ICD-10-CM | POA: Diagnosis not present

## 2023-08-17 DIAGNOSIS — R6520 Severe sepsis without septic shock: Secondary | ICD-10-CM | POA: Diagnosis not present

## 2023-08-17 DIAGNOSIS — B9562 Methicillin resistant Staphylococcus aureus infection as the cause of diseases classified elsewhere: Secondary | ICD-10-CM | POA: Diagnosis not present

## 2023-08-17 DIAGNOSIS — Z515 Encounter for palliative care: Secondary | ICD-10-CM

## 2023-08-17 DIAGNOSIS — A419 Sepsis, unspecified organism: Secondary | ICD-10-CM

## 2023-08-17 DIAGNOSIS — R7881 Bacteremia: Secondary | ICD-10-CM | POA: Diagnosis not present

## 2023-08-17 DIAGNOSIS — E538 Deficiency of other specified B group vitamins: Secondary | ICD-10-CM | POA: Diagnosis not present

## 2023-08-17 DIAGNOSIS — A4102 Sepsis due to Methicillin resistant Staphylococcus aureus: Secondary | ICD-10-CM | POA: Diagnosis not present

## 2023-08-17 DIAGNOSIS — M4317 Spondylolisthesis, lumbosacral region: Secondary | ICD-10-CM | POA: Diagnosis not present

## 2023-08-17 DIAGNOSIS — R338 Other retention of urine: Secondary | ICD-10-CM | POA: Diagnosis not present

## 2023-08-17 LAB — BASIC METABOLIC PANEL
Anion gap: 7 (ref 5–15)
BUN: 18 mg/dL (ref 8–23)
CO2: 20 mmol/L — ABNORMAL LOW (ref 22–32)
Calcium: 8.3 mg/dL — ABNORMAL LOW (ref 8.9–10.3)
Chloride: 105 mmol/L (ref 98–111)
Creatinine, Ser: 0.78 mg/dL (ref 0.44–1.00)
GFR, Estimated: >60 mL/min (ref >60)
Glucose, Bld: 107 mg/dL — ABNORMAL HIGH (ref 70–99)
Potassium: 3.8 mmol/L (ref 3.5–5.1)
Sodium: 132 mmol/L — ABNORMAL LOW (ref 135–145)

## 2023-08-17 LAB — CBC
HCT: 25.4 % — ABNORMAL LOW (ref 36.0–46.0)
Hemoglobin: 8.7 g/dL — ABNORMAL LOW (ref 12.0–15.0)
MCH: 31.3 pg (ref 26.0–34.0)
MCHC: 34.3 g/dL (ref 30.0–36.0)
MCV: 91.4 fL (ref 80.0–100.0)
Platelets: 434 10*3/uL — ABNORMAL HIGH (ref 150–400)
RBC: 2.78 MIL/uL — ABNORMAL LOW (ref 3.87–5.11)
RDW: 14.6 % (ref 11.5–15.5)
WBC: 12.8 10*3/uL — ABNORMAL HIGH (ref 4.0–10.5)
nRBC: 0 % (ref 0.0–0.2)

## 2023-08-17 LAB — CULTURE, BLOOD (ROUTINE X 2)
Culture  Setup Time: NO GROWTH
Special Requests: ADEQUATE
Special Requests: ADEQUATE

## 2023-08-17 LAB — CK: Total CK: 16 U/L — ABNORMAL LOW (ref 38–234)

## 2023-08-17 LAB — PROCALCITONIN: Procalcitonin: 2.62 ng/mL

## 2023-08-17 LAB — MIC RESULT

## 2023-08-17 MED ORDER — BISACODYL 5 MG PO TBEC
5.0000 mg | DELAYED_RELEASE_TABLET | Freq: Every day | ORAL | Status: DC | PRN
  Administered 2023-08-19 – 2023-08-20 (×2): 5 mg via ORAL
  Filled 2023-08-17 (×3): qty 1

## 2023-08-17 MED ORDER — ACETAMINOPHEN 325 MG PO TABS
650.0000 mg | ORAL_TABLET | Freq: Four times a day (QID) | ORAL | Status: DC
  Administered 2023-08-17 – 2023-08-22 (×18): 650 mg via ORAL
  Administered 2023-08-22: 325 mg via ORAL
  Administered 2023-08-22 – 2023-08-30 (×28): 650 mg via ORAL
  Filled 2023-08-17 (×48): qty 2

## 2023-08-17 MED ORDER — ACETAMINOPHEN 650 MG RE SUPP: 650.0000 mg | Freq: Four times a day (QID) | RECTAL | Status: DC

## 2023-08-17 MED ORDER — SENNOSIDES-DOCUSATE SODIUM 8.6-50 MG PO TABS
1.0000 | ORAL_TABLET | Freq: Two times a day (BID) | ORAL | Status: DC
  Administered 2023-08-17 – 2023-08-30 (×24): 1 via ORAL
  Filled 2023-08-17 (×25): qty 1

## 2023-08-17 NOTE — Plan of Care (Signed)

## 2023-08-17 NOTE — Consult Note (Signed)
Consultation Note Date: 08/17/2023 at 1030  Patient Name: Jocelyn Hooper  DOB: November 18, 1940  MRN: [id number]  Age / Sex: 82 y.o., female  PCP: Danella Penton, MD Referring Physician: Pennie Banter, DO  HPI/Patient Profile: 82 y.o. female  with past medical history of GERD, B12 deficiency, trigeminal neuralgia syndrome, iron deficiency anemia, arteriosclerotic disease, nephrolithiasis, bilateral TKA, spondylolisthesis of lumbosacral region, discitis of lumbar region, scoliosis s/p recent intervention (L5-S1 decompression with fusion 06/09/2023) admitted on 08/08/2023 with AMS and left hip pain.  From workup, patient found to have MRSA bacteremia and ID was consulted.  IV vancomycin initiated but subsequently changed to Dapto and Ceftin Rolan 9 dual MRSA coverage.  MRI of lumbar spine repeat cecitis of L4/L5 and edematous paraspinal tissue at L5.  Patient experiencing urinary retention and Foley placed.   TEE revealed no endocarditis.  Plan remains to continue blood cultures until clear of bacteremia and PICC cannot be placed until that time.  PMT was consulted for "uncontrolled pain".  Clinical Assessment and Goals of Care: Extensive chart review completed prior to meeting patient including labs, vital signs, imaging, progress notes, orders, and available advanced directive documents from current and previous encounters. I then met with patient, her daughter, and her son at bedside to discuss diagnosis prognosis, GOC, EOL wishes, disposition and options.  I introduced Palliative Medicine as specialized medical care for people living with serious illness. It focuses on providing relief from the symptoms and stress of a serious illness. The goal is to improve quality of life for both the patient and the family.  We discussed a brief life review of the patient.  Patient worked for [employer] until retirement.  In  retirement, patient enjoyed baking and spending time with her family.  She has 2 children who are at bedside and heavily involved in her care.  Of note, her daughter Jocelyn Hooper is a [employer] fundraiser.  As far as functional and nutritional status family endorses patient was independent with ADLs PTA.  He endorses patient would repeat things and perhaps had short-term memory issues over the past 1 year but did not have any forgetfulness of other signs of an advancing dementia.  Daughter endorses patient prides herself on independence.  Therefore, hospitalization in September with subsequent UTIs and now this hospitalization have certainly impacted patient's emotional wellbeing.  [name] endorses patient never cries but had 1 episode of being tearful with her during this hospitalization.  This is quite surprising given patient does not normally show emotion in this manner.  Therapeutic silence, active listening, and emotional support provided.  We discussed patient's current illness and what it means in the larger context of patient's on-going co-morbidities.  Pain management, symptom control, and role of palliative medicine discussed in detail.  Symptoms assessed.  Patient denies pain at this time.  Daughter endorses pain is excruciating and comes when patient stands and when she is moved.  In review of MAR, Norco has not been given in the last 24 hours.  Daughter endorses Norco seems  to take the edge off and make her pain more tolerable.  Discussed with RN and encouraged that patient be given Norco prior to any daily cares (bathing or moving) and prior to working with any therapies.  Also endorsed giving Tylenol as a baseline coverage for pain control.  This is part of her Norco but can provide a synergistic effect with other medications.  Daughter endorses pain seems to be well-controlled at this time.  Discussed art and science of balancing medications for pain with mentation,/cognitive faculties.    Dementia  is a chronic, progressive, and irreversible disease that is often exacerbated by acute illnesses and hospitalizations.  Daughter Jocelyn Hooper shares understanding of dementia and is hopeful that patient's current hospitalization will not further contribute to her memory/cognitive issues.  I discussed use of Cymbalta not only for nerve pain control but for anxiety and depression.  Patient Jocelyn Hooper was appreciative of this recommendation.  Will discuss with patient further as this would need to be managed on an outpatient basis.  I attempted to elicit values and goals of care important to the patient.  Patient was unable to share her thoughts regarding boundaries or goals of care.  Advance directives and CODE STATUS discussed in detail.  Full code versus DNR discussed.  Patient family was appreciative of readdressing her CODE STATUS as they do not wish for her to remain a full code.  However, patient can either confirm or deny what her wishes would be for her CODE STATUS at this time.  Therefore, default is to remain a full code.  However, patient open to further discussing.  PMT will continue to follow and support patient and family throughout her hospitalization.  Discussed with patient/family the importance of continued conversation with family and the medical providers regarding overall plan of care and treatment options, ensuring decisions are within the context of the patient's values and GOCs.    Primary Decision Maker PATIENT  Physical Exam Vitals reviewed.  Constitutional:      General: She is not in acute distress.    Appearance: She is normal weight.  HENT:     Head: Normocephalic.     Mouth/Throat:     Mouth: Mucous membranes are moist.  Eyes:     Pupils: Pupils are equal, round, and reactive to light.  Pulmonary:     Effort: Pulmonary effort is normal.  Abdominal:     Palpations: Abdomen is soft.  Skin:    General: Skin is warm and dry.  Neurological:     Mental Status: She is alert  and oriented to person, place, and time.  Psychiatric:        Mood and Affect: Mood normal.        Behavior: Behavior normal.        Thought Content: Thought content normal.        Judgment: Judgment normal.     Palliative Assessment/Data: 50%     Thank you for this consult. Palliative medicine will continue to follow and assist holistically.   Time Total: 75 minutes  Time spent includes: Detailed review of medical records (labs, imaging, vital signs), medically appropriate exam (mental status, respiratory, cardiac, skin), discussed with treatment team, counseling and educating patient, family and staff, documenting clinical information, medication management and coordination of care.  Signed by: Georgiann Cocker, DNP, FNP-BC Palliative Medicine   Please contact Palliative Medicine Team providers via [facility] for questions and concerns.

## 2023-08-17 NOTE — Progress Notes (Signed)
Date of Admission:  08/08/2023     ID: Jocelyn Hooper is a 82 y.o. female  Principal Problem:   Sepsis with encephalopathy without septic shock (HCC) Active Problems:   Mixed hyperlipidemia   GERD (gastroesophageal reflux disease)   ASCVD (arteriosclerotic cardiovascular disease)   B12 deficiency   Trigeminal neuralgia syndrome   Spondylolisthesis of lumbosacral region   Acute urinary retention   Altered mental status   MRSA bacteremia   Discitis of lumbar region   Inadequate pain control  On 06/09/23 underwent L5-S1 decompression with fusion at [facility] B/l TKA ( Left 2013, Rt 2019) Left hip pain- says she has bursitits and gets injection  MRI hip showed bursa in rt peritrochanteric area  Subjective: Low grade fever   Medications:   aspirin EC  81 mg Oral Daily   Chlorhexidine Gluconate Cloth  6 each Topical Daily   enoxaparin (LOVENOX) injection  40 mg Subcutaneous Q24H   estradiol  0.5 mg Oral Daily   feeding supplement  237 mL Oral BID BM   fluticasone  1 spray Each Nare Daily   gabapentin  300 mg Oral TID   loratadine  10 mg Oral Daily   metoprolol succinate  25 mg Oral Daily   morphine  15 mg Oral Q12H   pantoprazole  40 mg Oral Daily   polyethylene glycol  17 g Oral Daily   senna-docusate  1 tablet Oral BID   sodium chloride flush  3 mL Intravenous Q12H   topiramate  150 mg Oral QHS    Objective: Vital signs in last 24 hours: Patient Vitals for the past 24 hrs:  BP Temp Temp src Pulse Resp SpO2  08/17/23 1210 (!) 108/52 98.9 F (37.2 C) Oral 94 18 96 %  08/17/23 0905 112/66 -- -- -- 17 --  08/17/23 0851 112/61 98.6 F (37 C) Oral 100 18 96 %  08/17/23 0440 (!) 114/52 97.6 F (36.4 C) Oral (!) 105 18 98 %  08/16/23 2339 (!) 104/57 99.6 F (37.6 C) Oral (!) 122 18 96 %  08/16/23 2040 -- -- -- -- 20 --  08/16/23 2008 122/64 100.1 F (37.8 C) Oral (!) 114 18 96 %  08/16/23 1557 120/64 98.5 F (36.9 C) -- 84 -- 100 %     LDA Foley   PHYSICAL EXAM:   General: Alert, cooperative, no distress,   Lumbar surgical scar- has a scab Lungs: Clear to auscultation bilaterally. No Wheezing or Rhonchi. No rales. Heart: Regular rate and rhythm, no murmur, rub or gallop. Abdomen: Soft, non-tender,not distended. Bowel sounds normal. No masses Extremities: atraumatic, no cyanosis. No edema. No clubbing B/l knee surgical scars foley Skin: No rashes or lesions. Or bruising Lymph: Cervical, supraclavicular normal. Neurologic: Grossly non-focal  Lab Results    Latest Ref Rng & Units 08/17/2023    6:07 AM 08/16/2023    4:32 AM 08/15/2023    4:53 AM  CBC  WBC 4.0 - 10.5 K/uL 12.8  14.5  13.5   Hemoglobin 12.0 - 15.0 g/dL 8.7  9.3  9.4   Hematocrit 36.0 - 46.0 % 25.4  27.2  27.8   Platelets 150 - 400 K/uL 434  389  301        Latest Ref Rng & Units 08/17/2023    6:07 AM 08/16/2023    4:32 AM 08/15/2023    4:53 AM  CMP  Glucose 70 - 99 mg/dL 161  096  99   BUN 8 -  23 mg/dL 18  16  15    Creatinine 0.44 - 1.00 mg/dL 6.04  5.40  9.81   Sodium 135 - 145 mmol/L 132  131  132   Potassium 3.5 - 5.1 mmol/L 3.8  3.5  3.1   Chloride 98 - 111 mmol/L 105  105  104   CO2 22 - 32 mmol/L 20  20  19    Calcium 8.9 - 10.3 mg/dL 8.3  8.3  8.1       Microbiology: 08/08/23- 4/4 MRSA bacteremia 08/10/23-BC - ng 11/29 BC + mRSA 12/2 Bc  Studies/Results: No results found.   Assessment/Plan: MRSA bacteremia Recent spine fusion ( sept 2024) High bio-burden Source- discitis/osteo at l5-l4 area Left hip pain- MRI without contrast no obvious abnormality-   TEE no endocarditis Leucocytosis- Pt on dual  dapto and ceftaroline (Dual MRSA covergae) because of persistent fever and bacteremia- will treat with combo for atlest 7-10 days  and then single anibiotic- will need 6 weeks minimum  MRI brain small vessel disease Repeat MRI lumbar spine- discitis L4/L5, edematous paraspinal tissue at l5 Repeat blood culture from 11/29 positive for gram positive  cocci Repeat blood culture until clear of bacteremia PICC cannot be placed until then   Encephalopathy- resolved  Urinary retention- has foley Because of recurrent fever ceftriaxone was given for 3 days   Discussed with patient,

## 2023-08-17 NOTE — Plan of Care (Signed)

## 2023-08-17 NOTE — Progress Notes (Signed)
Progress Note   Patient: Jocelyn Hooper:[id number] DOB: 06/05/1941 DOA: 08/08/2023     9 DOS: the patient was seen and examined on 08/17/2023   Brief hospital course: HPI on admission 08/08/2023: "Jocelyn Hooper is a 82 y.o. female with medical history significant of iron deficiency anemia, B12 deficiency, arteriosclerotic disease, nephrolithiasis, scoliosis s/p recent intervention (06/09/2023), who presents to the ED due to altered mental status.   History obtained from both patient and her family at bedside.  Mrs. Finchum states that she experiencing worsening of her left hip pain and has been told in the past she has bursitis.  Her husband at bedside states that yesterday, she climbed the stairs numerous times in preparation for the holidays.  Mrs. Lipke also endorses bladder fullness and feels as though she is unable to urinate.  ....   ED course: On arrival to the ED, patient was hypertensive at 139/85 with heart rate of 117.  She was afebrile at 99.4 but subsequently developed a fever of 101.5.  She was saturating at 99% on room air.  Initial workup notable for WBC of 12.6, sodium of 133, bicarb of 18, creatinine 0.75 with GFR above 60.  Lactic acid within normal limits.  COVID-19 negative.  Urinalysis with ketonuria and rare bacteria.  CT of the abdomen was obtained and results pending.  Chest x-ray was obtained with no active disease.  Patient was started on IV fluids and Rocephin.  TRH contacted for admission."    Patient was found to have MRSA bacteremia.  Started on IV Vancomycin and Infectious Disease consulted.   Initial evaluation for infection source including MRI's of lumbar spine and hip and TEE were all negative.  Pt continued to spike fevers and MRI lumbar spine was repeated with contrast on 11/30 showing findings of discitis/osteomyelitis at L4-L5, but no drain-able fluid collections seen.   Case discussed with Dr. Lovell Sheehan, patient's Neurosurgeon on 08/16/23.  He recommended  continued IV antibiotic therapy.  There is no current indication for any surgery which would be a last resort or if neurologic compromise.  Further hospital course and management as outlined below.   Assessment and Plan:  Sepsis MRSA bacteremia Sepsis POA as evidenced by fevers, tachycardia, tachypnea, leukocytosis. Blood cultures +MRSA in all 4 bottles. Patient presented with complaints of severe left hip pain and is status post recent L5-S1 decompression, instrumentation and fusion on 06/09/2023.   Initial MRI of the lumbar spine without contrast had no findings to suggest infection, see report.   MRI of the left hip showed  mild OA, no acute findings, small bilateral hip joint effusions, mild tendinosis of the bilateral gluteus medius and minimus tendons. Mild right peritrochanteric bursitis. Mild tendinosis of the bilateral hamstring tendon origins.       11/28 -- TEE negative for endocarditis. 11/29 -- MRI brain negative  11/30 -- repeated MRI lumbar spine w/wo contrast --- discitis/osteomyelitis at L4-L5. Other findings as before, see report.  12/2 -- persistent fevers Tmax 102 overnight. Antibiotics changed IV Vanc >> Dapto & ceftaroline 12/3 -- Tmax 100.1 at 8 pm last night  --ID is following --Initially on IV Vancomycin --Now on IV Daptomycin and ceftaroline --Initial repeat Bcx from 11/26 no growth & final --Repeat blood cultures 11/29 again +MRSA  --Treat fevers as needed  Inadequate Pain Control -- pt becomes too confused wit oxycodone.  Norco has been ineffective.   Discussed options with pt's daughter and since pt not having nausea with hydrocodone or when  oxycodone was tried, will trial lowest dose of long-acting morphine and monitor (pt has nausea with codeine on allergy profile) --Started MS Contin 15 mg BID -- seems tolerating well, continue --Palliative care consulted for assistant -- Palliative consulted for assistance --Titrate regimen --Increased gabapentin  to 300 TID (was just on at bedtime) --Monitor for oversedation or confusion --Goal of adequate pain control for mobility, pt was ambulating with walker just prior to admission   Acute urinary retention Significant bladder distention noted on CT imaging with patient's inability to urinate afterwards.   In-N-Out catheter with 600 cc.  Unclear etiology Foley catheter placed 11/26 --Hold oxybutynin for now --Continue Foley for now given severe pain, voiding trial once pain control improved   Acute metabolic encephalopathy Secondary to sepsis.  Improved back to baseline. Mentation waxes and wanes with fevers. --Mgmt of infection as above --Delirium precautions --Treat fevers as needed --Monitor & replace electrolytes   Hypophosphatemia - resolved with replacement  Phos 1.0 replaced >> 4.5 >> 2.6 >> 2.7 --Monitor phos levels    Hypokalemia - resolved with replacement Supplement potassium   ASCVD (arteriosclerotic cardiovascular disease) Continue home aspirin and metoprolol        Subjective: Pt resting awake in bed this AM with son at bedside. Pt reports her pain control is better today, started lowest dose long acting morphine last night.  She denies nausea or feeling too tired or confused.  Son denies noticing confusion.     Physical Exam: Vitals:   08/17/23 0440 08/17/23 0851 08/17/23 0905 08/17/23 1210  BP: (!) 114/52 112/61 112/66 (!) 108/52  Pulse: (!) 105 100  94  Resp: 18 18 17 18   Temp: 97.6 F (36.4 C) 98.6 F (37 C)  98.9 F (37.2 C)  TempSrc: Oral Oral  Oral  SpO2: 98% 96%  96%  Weight:      Height:       General exam: awake, appears drowsy, no acute distress HEENT: moist mucus membranes, hearing grossly normal Respiratory system: CTAB no wheezes or rhonchi on room air normal respiratory effort Cardiovascular system: RRR, no murmurs, no peripheral edema Gastrointestinal system: soft, NT, ND Central nervous system: A&O x. no gross focal neurologic  deficits, normal speech Extremities: no edema, normal tone Skin: dry, intact, no rashes on visualized skin Psychiatry: normal mood & affect, judgment and insight appear normal     Data Reviewed: Labs reviewed & notable for --   WBC 13.5 >> 14.5 >> 12.8 Hbg 9.3 >> 9.4 >> 9.3 >> 8.7stable  Na 134 >> 132 >> 131 >> 132 Glucose 107 Ca 8.3  CK 16 Procal 2.62    11-30 -- MRI lumbar spine w/wo contrast -- shows source of infection discitis/osteomyelitis at L4-L5. IMPRESSION: "1. Partial collapse of the L5 vertebral body, more prominently on the left. 2. Edema and enhancement in the L5 vertebral body and anterior aspect of the L4 vertebral body on the right. Fluid and mild enhancement is present within the L4-5 disc space. Findings are concerning for discitis/osteomyelitis. 3. Edematous paraspinous soft tissues at the L5 level. 4. Moderate right foraminal stenosis at L3-4 secondary to asymmetric right-sided facet hypertrophy and a rightward disc protrusion. 5. Moderate left foraminal stenosis at L4-5 secondary to asymmetric left-sided facet hypertrophy. 6. Nerve roots are crowded on the right at L1-2 and L2-3 without significant disc protrusion or stenosis"     Procedures --  TEE 11/27 negative for endocarditis, see report. 2D echo 11/28 - EF 55-60%, grade I DD,  mild LVH, mild-mod MR   Family Communication:  son at bedside on rounds this AM.   Daughter [name] updated by phone this afternoon.    Disposition: Status is: Inpatient Remains inpatient appropriate because: Treatment of MRSA bacteremia, ongoing evaluation, still spiking fevers.    Planned Discharge Destination:  TBD    Time spent: 36 minutes  Author: Pennie Banter, DO 08/17/2023 4:11 PM  For on call review [email or web address].

## 2023-08-18 DIAGNOSIS — A4101 Sepsis due to Methicillin susceptible Staphylococcus aureus: Secondary | ICD-10-CM | POA: Diagnosis not present

## 2023-08-18 DIAGNOSIS — B9562 Methicillin resistant Staphylococcus aureus infection as the cause of diseases classified elsewhere: Secondary | ICD-10-CM | POA: Diagnosis not present

## 2023-08-18 DIAGNOSIS — M4317 Spondylolisthesis, lumbosacral region: Secondary | ICD-10-CM | POA: Diagnosis not present

## 2023-08-18 DIAGNOSIS — R52 Pain, unspecified: Secondary | ICD-10-CM | POA: Diagnosis not present

## 2023-08-18 DIAGNOSIS — R6520 Severe sepsis without septic shock: Secondary | ICD-10-CM | POA: Diagnosis not present

## 2023-08-18 DIAGNOSIS — R338 Other retention of urine: Secondary | ICD-10-CM | POA: Diagnosis not present

## 2023-08-18 DIAGNOSIS — R7881 Bacteremia: Secondary | ICD-10-CM | POA: Diagnosis not present

## 2023-08-18 DIAGNOSIS — A419 Sepsis, unspecified organism: Secondary | ICD-10-CM | POA: Diagnosis not present

## 2023-08-18 DIAGNOSIS — E538 Deficiency of other specified B group vitamins: Secondary | ICD-10-CM | POA: Diagnosis not present

## 2023-08-18 DIAGNOSIS — M4646 Discitis, unspecified, lumbar region: Secondary | ICD-10-CM | POA: Diagnosis not present

## 2023-08-18 DIAGNOSIS — Z515 Encounter for palliative care: Secondary | ICD-10-CM | POA: Diagnosis not present

## 2023-08-18 DIAGNOSIS — G9341 Metabolic encephalopathy: Secondary | ICD-10-CM | POA: Diagnosis not present

## 2023-08-18 DIAGNOSIS — M4626 Osteomyelitis of vertebra, lumbar region: Secondary | ICD-10-CM | POA: Diagnosis not present

## 2023-08-18 LAB — BASIC METABOLIC PANEL
Anion gap: 4 — ABNORMAL LOW (ref 5–15)
BUN: 17 mg/dL (ref 8–23)
CO2: 23 mmol/L (ref 22–32)
Calcium: 8.3 mg/dL — ABNORMAL LOW (ref 8.9–10.3)
Chloride: 104 mmol/L (ref 98–111)
Creatinine, Ser: 0.63 mg/dL (ref 0.44–1.00)
GFR, Estimated: >60 mL/min (ref >60)
Glucose, Bld: 108 mg/dL — ABNORMAL HIGH (ref 70–99)
Potassium: 3.9 mmol/L (ref 3.5–5.1)
Sodium: 131 mmol/L — ABNORMAL LOW (ref 135–145)

## 2023-08-18 LAB — CBC
HCT: 26.6 % — ABNORMAL LOW (ref 36.0–46.0)
Hemoglobin: 8.8 g/dL — ABNORMAL LOW (ref 12.0–15.0)
MCH: 30.6 pg (ref 26.0–34.0)
MCHC: 33.1 g/dL (ref 30.0–36.0)
MCV: 92.4 fL (ref 80.0–100.0)
Platelets: 463 10*3/uL — ABNORMAL HIGH (ref 150–400)
RBC: 2.88 MIL/uL — ABNORMAL LOW (ref 3.87–5.11)
RDW: 14.8 % (ref 11.5–15.5)
WBC: 13.5 10*3/uL — ABNORMAL HIGH (ref 4.0–10.5)
nRBC: 0 % (ref 0.0–0.2)

## 2023-08-18 NOTE — Progress Notes (Signed)
Palliative Care Progress Note, Assessment & Plan   Patient Name: Jocelyn Hooper       Date: 08/18/2023 DOB: April 19, 1941  Age: 82 y.o. MRN#: [id number] Attending Physician: Jonah Blue, MD Primary Care Physician: Danella Penton, MD Admit Date: 08/08/2023  Subjective: Patient is sitting up in bed eating her lunch.  Her daughter is at bedside.  Patient acknowledges my presence and is able to make her wishes known.  She has no acute complaints at this time.  HPI: 82 y.o. female  with past medical history of GERD, B12 deficiency, trigeminal neuralgia syndrome, iron deficiency anemia, arteriosclerotic disease, nephrolithiasis, bilateral TKA, spondylolisthesis of lumbosacral region, discitis of lumbar region, scoliosis s/p recent intervention (L5-S1 decompression with fusion 06/09/2023) admitted on 08/08/2023 with AMS and left hip pain.   From workup, patient found to have MRSA bacteremia and ID was consulted.  IV vancomycin initiated but subsequently changed to Dapto and Ceftin Rolan 9 dual MRSA coverage.   MRI of lumbar spine repeat cecitis of L4/L5 and edematous paraspinal tissue at L5.   Patient experiencing urinary retention and Foley placed.    TEE revealed no endocarditis.   Plan remains to continue blood cultures until clear of bacteremia and PICC cannot be placed until that time.   PMT was consulted for "uncontrolled pain".  Summary of counseling/coordination of care: Extensive chart review completed prior to meeting patient including labs, vital signs, imaging, progress notes, orders, and available advanced directive documents from current and previous encounters.   After reviewing the patient's chart and assessing the patient at bedside, I spoke with patient and her daughter [name] in regards to  symptom management and plan of care.  Symptoms assessed.  Specifically, address patient's pain.  She endorses she is not experiencing pain.  She endorses that she moved from bed to chair this morning with minimal pain.  We discussed use of MS Contin and Tylenol scheduled and will continue.  Patient has not utilized as needed Norco in the past 24 hours.  I reminded her that should she need the medication she can ask for it.  I would recommended she take 1 tablet before working with physical therapy.  Patient endorsed understanding.  I attempted elicit goals and values up or to the patient.  First, patient shares she would not want anything done to sustain her life artificially.  Then, she shares that she would be accepting of CPR but not of a ventilator.  Discussed the importance of both cardiac and pulmonary resuscitative measures in the event of a cardiopulmonary arrest.  Patient appeared to be a little confused.  Extensive distress can had between full and DNR status.  Patient was unable to decipher which when she would want.  I shared that it is not emergent the patient discussed her CODE STATUS at this time.  What is important is that she has these discussions with her next of kin decision maker before any emergency occurs.  I noticed a copy of a MOST form as another form of advance care planning.  I offered this paperwork in hopes that patient could read it and black-and-white and gain some clarification on boundaries and goals of care.  Questions and concerns  were addressed.  No adjustment to [facility] needed his pain appears adequately controlled at this time.  I would recommend a trial of Cymbalta in order to address patient's pain as well as some depression that her daughter shares patient has experienced with these recent hospitalizations.  However, as an acute palliative care specialist in the inpatient setting of not be able to follow-up with patient on side effects or effectiveness of  Cymbalta.  PMT will continue to follow and support patient and family throughout her hospitalization.   Physical Exam Vitals reviewed.  Constitutional:      General: She is not in acute distress.    Appearance: She is normal weight.  HENT:     Head: Normocephalic.     Mouth/Throat:     Mouth: Mucous membranes are moist.  Eyes:     Pupils: Pupils are equal, round, and reactive to light.  Pulmonary:     Effort: Pulmonary effort is normal.  Abdominal:     Palpations: Abdomen is soft.  Skin:    General: Skin is warm and dry.  Neurological:     Mental Status: She is alert and oriented to person, place, and time.  Psychiatric:        Mood and Affect: Mood normal.        Behavior: Behavior normal.        Thought Content: Thought content normal.        Judgment: Judgment normal.             Total Time 35 minutes   Time spent includes: Detailed review of medical records (labs, imaging, vital signs), medically appropriate exam (mental status, respiratory, cardiac, skin), discussed with treatment team, counseling and educating patient, family and staff, documenting clinical information, medication management and coordination of care.  [name], DNP, FNP-BC Palliative Medicine Team

## 2023-08-18 NOTE — TOC Progression Note (Signed)
Transition of Care [facility]) - Progression Note    Patient Details  Name: Jocelyn Hooper MRN: [id number] Date of Birth: Jan 01, 1941  Transition of Care [facility]) CM/SW Contact  Truddie Hidden, RN Phone Number: 08/18/2023, 3:36 PM  Clinical Narrative:    TOC continuing to follow patient's progress throughout discharge planning.   Expected Discharge Plan: Home w Home Health Services Barriers to Discharge: Continued Medical Work up  Expected Discharge Plan and Services                                               Social Determinants of Health (SDOH) Interventions SDOH Screenings   Food Insecurity: No Food Insecurity (08/09/2023)  Housing: Low Risk  (08/09/2023)  Transportation Needs: No Transportation Needs (08/09/2023)  Utilities: Not At Risk (08/09/2023)  Financial Resource Strain: Low Risk  (07/01/2023)   Received from [facility]  Tobacco Use: Low Risk  (08/08/2023)    Readmission Risk Interventions     No data to display

## 2023-08-18 NOTE — Progress Notes (Addendum)
Date of Admission:  08/08/2023     ID: Jocelyn Hooper is a 82 y.o. female  Principal Problem:   Sepsis with encephalopathy without septic shock (HCC) Active Problems:   Mixed hyperlipidemia   GERD (gastroesophageal reflux disease)   ASCVD (arteriosclerotic cardiovascular disease)   B12 deficiency   Trigeminal neuralgia syndrome   Spondylolisthesis of lumbosacral region   Acute urinary retention   Altered mental status   MRSA bacteremia   Discitis of lumbar region   Inadequate pain control  On 06/09/23 underwent L5-S1 decompression with fusion at [facility] B/l TKA ( Left 2013, Rt 2019) Left hip pain- says she has bursitits and gets injection  MRI hip showed bursa in rt peritrochanteric area  Subjective: Says she is feeling better Worked with PT and sat on the edge of bed No dizziness No fever in > 24 hrs   Medications:   acetaminophen  650 mg Oral Q6H   Or   acetaminophen  650 mg Rectal Q6H   aspirin EC  81 mg Oral Daily   Chlorhexidine Gluconate Cloth  6 each Topical Daily   enoxaparin (LOVENOX) injection  40 mg Subcutaneous Q24H   estradiol  0.5 mg Oral Daily   feeding supplement  237 mL Oral BID BM   fluticasone  1 spray Each Nare Daily   gabapentin  300 mg Oral TID   loratadine  10 mg Oral Daily   metoprolol succinate  25 mg Oral Daily   morphine  15 mg Oral Q12H   pantoprazole  40 mg Oral Daily   polyethylene glycol  17 g Oral Daily   senna-docusate  1 tablet Oral BID   sodium chloride flush  3 mL Intravenous Q12H   topiramate  150 mg Oral QHS    Objective: Vital signs in last 24 hours: Patient Vitals for the past 24 hrs:  BP Temp Temp src Pulse Resp SpO2  08/18/23 1051 (!) 91/51 -- -- 86 -- --  08/18/23 0825 (!) 91/51 98.1 F (36.7 C) Oral 86 17 97 %  08/18/23 0454 (!) 100/57 97.7 F (36.5 C) -- 89 20 100 %  08/17/23 2317 (!) 100/56 98.6 F (37 C) Oral 88 18 99 %  08/17/23 2031 (!) 97/47 98.3 F (36.8 C) -- 93 18 97 %  08/17/23 1752 -- -- -- -- 15 --   08/17/23 1709 -- -- -- -- 16 --  08/17/23 1700 -- -- -- -- 16 --  08/17/23 1632 (!) 97/49 98.7 F (37.1 C) Oral (!) 101 -- 99 %  08/17/23 1609 (!) 99/47 98.6 F (37 C) Oral 93 -- 99 %  08/17/23 1210 (!) 108/52 98.9 F (37.2 C) Oral 94 18 96 %     LDA Foley   PHYSICAL EXAM:  General:alert and in no distress Lumbar surgical scar- has a scab Lungs: Clear to auscultation bilaterally. No Wheezing or Rhonchi. No rales. Heart: Regular rate and rhythm, no murmur, rub or gallop. Abdomen: Soft, non-tender,not distended. Bowel sounds normal. No masses Extremities: atraumatic, no cyanosis. No edema. No clubbing B/l knee surgical scars foley Skin: No rashes or lesions. Or bruising Lymph: Cervical, supraclavicular normal. Neurologic: Grossly non-focal  Lab Results    Latest Ref Rng & Units 08/18/2023    4:18 AM 08/17/2023    6:07 AM 08/16/2023    4:32 AM  CBC  WBC 4.0 - 10.5 K/uL 13.5  12.8  14.5   Hemoglobin 12.0 - 15.0 g/dL 8.8  8.7  9.3  Hematocrit 36.0 - 46.0 % 26.6  25.4  27.2   Platelets 150 - 400 K/uL 463  434  389        Latest Ref Rng & Units 08/18/2023    4:18 AM 08/17/2023    6:07 AM 08/16/2023    4:32 AM  CMP  Glucose 70 - 99 mg/dL 829  562  130   BUN 8 - 23 mg/dL 17  18  16    Creatinine 0.44 - 1.00 mg/dL 8.65  7.84  6.96   Sodium 135 - 145 mmol/L 131  132  131   Potassium 3.5 - 5.1 mmol/L 3.9  3.8  3.5   Chloride 98 - 111 mmol/L 104  105  105   CO2 22 - 32 mmol/L 23  20  20    Calcium 8.9 - 10.3 mg/dL 8.3  8.3  8.3       Microbiology: 08/08/23- 4/4 MRSA bacteremia 08/10/23-BC - ng 11/29 BC + mRSA 12/2 Bc  Studies/Results: No results found.   Assessment/Plan: MRSA bacteremia  Recent spine fusion 06/09/23 underwent L5-S1 laminotomy, facectomy and posterior lumbar interbody fusion High bio-burden Now has  discitis/osteo a L4-L5 area Edematous paraspinal soft tissue at L5 Partial collapse of L5 vertebral body TEE no endocarditis Leucocytosis- stable Pt  on dual  dapto and ceftaroline (Dual MRSA covergae) since 12/2 because of persistent fever and bacteremia- now fever has resolved  will treat with combo for atlest 7-10 days  until 08/25/23 and then single antibiotic Daptomycin - will need 6 weeks minimum until 09/27/23  MRI brain small vessel disease Repeat MRI lumbar spine- discitis L4/L5, edematous paraspinal tissue at l5 Repeat blood culture from 11/29 positive for gram positive cocci Repeat blood culture from 12/2 so far negative PICC can be placed tomorrow   Encephalopathy- resolved  Urinary retention- has foley Because of recurrent fever ceftriaxone was given for 3 days   Discussed with patient in detail

## 2023-08-18 NOTE — Progress Notes (Addendum)
Physical Therapy Treatment Patient Details Name: Jocelyn Hooper MRN: [id number] DOB: 03-Sep-1941 Today's Date: 08/18/2023   History of Present Illness Pt is an 82 yo F presenting due to AMS, with MRSA, sepsis, acute urinary retention, acute metabolic encephalopathy, arteriosclerotic CVD, and hypokalemia. PMH significant for L5-S1 TLIF (06/09/2023), anemia, L shoulder bursitis, CAD, CSF leak, IBS, Scoliosis, Trigeminal neuralgia, and B TKR.    PT Comments  Pt with a R lean in bed that maintains t/o the session, she showed good effort t/o the session and was eager to try doing as much as she could on her own but ultimately struggled with all aspects of mobility with lean to R and back being a constant issue.  Some ability to sit at EOB with direct hand over hand assist to WB through R UE but generally self selecting lean on R elbow and heavy grabbing of rail (unable to let go with VCs only).  Similar issues with multiple standing efforts with R and backward lean. Pt c/o 9/10 but showed discomfort but little grimacing, etc during activity.  Pt's HR to ~120 with exertion.   Son present and reports that just 2 weeks ago she was walking w/o issue, endorses that she looked better today then prior PT session despite obvious continued limitations. Pt will benefit from continued PT to address functional limitations.     If plan is discharge home, recommend the following: Assistance with cooking/housework;Help with stairs or ramp for entrance;Supervision due to cognitive status;Assist for transportation;Direct supervision/assist for financial management;A lot of help with walking and/or transfers;A lot of help with bathing/dressing/bathroom   Can travel by private vehicle     No  Equipment Recommendations  Other (comment) (TBD)    Recommendations for Other Services       Precautions / Restrictions Precautions Precautions: Fall Restrictions Weight Bearing Restrictions: No     Mobility  Bed  Mobility Overal bed mobility: Needs Assistance Bed Mobility: Rolling, Sidelying to Sit, Sit to Sidelying Rolling: Mod assist Sidelying to sit: Mod assist   Sit to supine: Mod assist   General bed mobility comments: PT showed great effort with trying to get to EOB, but despite moving LEs a little and heavy use of UEs on rail needed heavy and persistent assist to get to EOB.  Pt with heavy R lean and even with plenty of cuing, strategies and extra time only partially able to attain sitting w/o at least minA due to retro/R lean.   Similarly she made a good effort with trying to get back into bed but despite being determined to try on her own she needed heavy assist to get back into bed.    Transfers Overall transfer level: Needs assistance Equipment used: Rolling walker (2 wheels) Transfers: Sit to/from Stand Sit to Stand: Max assist           General transfer comment: multiple sit to stand efforts with heavy cuing and encouragement. She was unable to get weight shifting forward and once heavily assisted to standing could not lean weight enough to the L UE to actually standing w/o at least some assist and typically needing maxA simply to maintain standing.  Heavy retro/R lean present t/o each effort.    Ambulation/Gait               General Gait Details: unsafe to attempt   Stairs             Wheelchair Mobility     Tilt Bed  Modified Rankin (Stroke Patients Only)       Balance Overall balance assessment: Needs assistance Sitting-balance support: Feet supported, Bilateral upper extremity supported Sitting balance-Leahy Scale: Poor Sitting balance - Comments: heavy R lean, struggled to get R elbow off bed despite much assist     Standing balance-Leahy Scale: Poor Standing balance comment: backward and R lean - unable to get weight forward onto walker despite excessive cuing and assistance                            Cognition Arousal:  Alert Behavior During Therapy: Flat affect Overall Cognitive Status: Impaired/Different from baseline                         Following Commands: Follows one step commands inconsistently       General Comments: limited carry through after initial effort with most cuing        Exercises      General Comments General comments (skin integrity, edema, etc.): HR up to 120 with activity      Pertinent Vitals/Pain Pain Assessment Pain Score: 9  Pain Location: back, L hip Pain Descriptors / Indicators: Grimacing, Discomfort, Guarding    Home Living                          Prior Function            PT Goals (current goals can now be found in the care plan section) Progress towards PT goals: Progressing toward goals    Frequency    Min 1X/week      PT Plan      Co-evaluation              AM-PAC PT "6 Clicks" Mobility   Outcome Measure  Help needed turning from your back to your side while in a flat bed without using bedrails?: A Lot Help needed moving from lying on your back to sitting on the side of a flat bed without using bedrails?: A Lot Help needed moving to and from a bed to a chair (including a wheelchair)?: Total Help needed standing up from a chair using your arms (e.g., wheelchair or bedside chair)?: A Lot Help needed to walk in hospital room?: A Lot Help needed climbing 3-5 steps with a railing? : Total 6 Click Score: 10    End of Session Equipment Utilized During Treatment: Gait belt Activity Tolerance: Other (comment);Patient limited by pain (confusion) Patient left: in bed;with call bell/phone within reach;with bed alarm set;with family/visitor present   PT Visit Diagnosis: Difficulty in walking, not elsewhere classified (R26.2);Muscle weakness (generalized) (M62.81);Pain Pain - Right/Left: Left Pain - part of body: Hip     Time: 1610-9604 PT Time Calculation (min) (ACUTE ONLY): 33 min  Charges:    $Therapeutic  Activity: 23-37 mins PT General Charges $$ ACUTE PT VISIT: 1 Visit                     Malachi Pro, DPT 08/18/2023, 6:03 PM

## 2023-08-18 NOTE — Evaluation (Signed)
Occupational Therapy Treatment Patient Details Name: Jocelyn Hooper MRN: [id number] DOB: [birth year] Today's Date: 08/18/2023   History of present illness Pt is an 82 yo F presenting due to AMS, with MRSA, sepsis, acute urinary retention, acute metabolic encephalopathy, arteriosclerotic CVD, and hypokalemia. PMH significant for L5-S1 TLIF (06/09/2023), anemia, L shoulder bursitis, CAD, CSF leak, IBS, Scoliosis, Trigeminal neuralgia, and B TKR.   OT comments  Pt presenting with weakness and decreased balance resulting in impaired ADL performance. Premedicated prior to tx. Max A - TOTAL A +2 for bed mobility using log roll technique, pt resistive of all movement with guarding, initiates hand placement with max verbal and tactile cues but requires TOTAL A to transition to sitting EOB. Once seated EOB, pt with heavy posterior and R lateral lean, BUE supported and BLE supported. Able to engage in LE exercises and minimal seated balance exercises to improve activity tolerance and core strength. Requires max multimodal cuing without improvement in upright posture or balance. Follows commands inconsistently. Discharge recommendation appropriate, OT will continue POC. Patient will benefit from continued inpatient follow up therapy, <3 hours/day       If plan is discharge home, recommend the following:  Help with stairs or ramp for entrance;Assist for transportation;Assistance with cooking/housework;Two people to help with walking and/or transfers;Two people to help with bathing/dressing/bathroom   Equipment Recommendations  Other (comment)       Precautions / Restrictions Precautions Precautions: Fall Restrictions Weight Bearing Restrictions: No       Mobility Bed Mobility Overal bed mobility: Needs Assistance Bed Mobility: Rolling, Sidelying to Sit, Sit to Sidelying Rolling: +2 for physical assistance, Max assist Sidelying to sit: Total assist, +2 for physical assistance     Sit to sidelying:  Total assist, HOB elevated, Used rails, +2 for physical assistance General bed mobility comments: attempted rolling for log roll technique, pt initiates small movements with extremeties but overall weak and resisting. TOTAL +2 for transitions    Transfers                   General transfer comment: unable to safely progress, strong posterior and R lateral lean, pt unable to correct even with max multimodal cuing and balance tasks     Balance Overall balance assessment: Needs assistance Sitting-balance support: Feet supported, Bilateral upper extremity supported Sitting balance-Leahy Scale: Poor Sitting balance - Comments: unable to static sit without physical assist at trunk due to posterior lean despite heavy multimodal cuing to correct Postural control: Posterior lean, Right lateral lean                                 ADL either performed or assessed with clinical judgement   ADL Overall ADL's : Needs assistance/impaired                                       General ADL Comments: Max A - TOTAL A +2 for bed mobility using log roll technique, pt resistive of all movement with guarding, initiates hand placement with max verbal and tactile cues but requires TOTAL A to transition to sitting EOB. Once seated EOB, pt with heavy posterior and R lateral lean, BUE supported and BLE supported. Able to engage in LE exercises and minimal seated balance exercises.      Cognition Arousal: Alert Behavior During Therapy: Flat affect  Overall Cognitive Status: Impaired/Different from baseline Area of Impairment: Attention, Following commands, Awareness                 Orientation Level: Disoriented to, Situation     Following Commands: Follows one step commands inconsistently   Awareness: Intellectual   General Comments: Pt alert to self, and friend in room. Agrees to OT tx, but is actively resistive of mobility        Exercises Exercises:  General Lower Extremity General Exercises - Lower Extremity Hip Flexion/Marching: Both, 5 reps, AAROM, Strengthening Other Exercises Other Exercises: Static sitting EOB for activity tolerance, core strength and upright posture       General Comments HR 98-105    Pertinent Vitals/ Pain       Pain Assessment Pain Assessment: 0-10 Pain Score: 9  Pain Location: back, L hip Pain Descriptors / Indicators: Grimacing, Discomfort, Guarding Pain Intervention(s): Premedicated before session, Monitored during session, Limited activity within patient's tolerance, Repositioned   Frequency  Min 1X/week        Progress Toward Goals  OT Goals(current goals can now be found in the care plan section)  Progress towards OT goals: Progressing toward goals      AM-PAC OT "6 Clicks" Daily Activity     Outcome Measure   Help from another person eating meals?: None Help from another person taking care of personal grooming?: A Little Help from another person toileting, which includes using toliet, bedpan, or urinal?: Total Help from another person bathing (including washing, rinsing, drying)?: A Lot Help from another person to put on and taking off regular upper body clothing?: A Little Help from another person to put on and taking off regular lower body clothing?: Total 6 Click Score: 14    End of Session    OT Visit Diagnosis: Muscle weakness (generalized) (M62.81);Other abnormalities of gait and mobility (R26.89);Unsteadiness on feet (R26.81)   Activity Tolerance Patient limited by pain   Patient Left in bed;with bed alarm set;with call bell/phone within reach;with family/visitor present   Nurse Communication Mobility status;Patient requests pain meds;Other (comment)        Time: 1660-6301 OT Time Calculation (min): 22 min  Charges: OT General Charges $OT Visit: 1 Visit OT Treatments $Self Care/Home Management : 8-22 mins  Bernisha Verma L. Emilianna Barlowe, OTR/L  08/18/23, 11:44 AM

## 2023-08-18 NOTE — Hospital Course (Addendum)
    Hospital course / significant events:  82yo with h/o IDA/B12 deficiency scoliosis s/p intervention 06/09/2023 who presented on 12/3 with AMS.  She was septic with MRSA bacteremia related to discitis/osteomyelitis at L4-5.  TEE negative.  On Daptomycin and Ceftaroline. SNF placement pending - will accept Monday 12/16  Consultants:  Infectious Disease Cardiology Palliative care   Procedures/Surgeries: TEE 11/27 Echocardiogram 11/28      ASSESSMENT & PLAN:   Sepsis due to MRSA bacteremia/L4-5 osteomyelitis/discitis Sepsis POA, as evidenced by fevers, tachycardia, tachypnea, leukocytosis - pt presented w/ severe left hip pain and is status post recent L5-S1 decompression, instrumentation and fusion on 06/09/2023.  Blood cultures +MRSA in all 4 bottles Initial MRI of the lumbar spine negative but repeat with discitis/osteomyelitis at L4-L5 TEE negative for endocarditis on 11/28 Based on persistent pain and minimal ability to participate in therapy, repeated lumbosacral MRI on 12/6 with no significant change from priorInitial repeat Bcx from 11/26 no growth & final Repeat blood cultures 11/29 again +MRSA  Repeat blood cultures again on 12/2 negative x 5 days S/p PICC placement  ID is following IV Daptomycin through 09/27/23 SNF rehab recommended - pending auth   Toxic encephalopathy - resolved  Due to one time dose of Remeron 7.5 mg given to try to boost mood and appetite Medication was stopped and add to allergy list Negative head CT Symptoms resolved after cessation of Remeron monitor   Inadequate Pain Control  Goal of adequate pain control for mobility, pt was ambulating with walker just prior to admission Pain recurred without MS Contin (which has held d/t encephalopathy), so this was resumed Palliative care following  Increased gabapentin to 300 TID (was just on at bedtime) Consider addition of Cymbalta as an outpatient   Constipation likely opiate induced Movantik  initiated  Continue stool softeners/laxatives  Enema prn   Acute urinary retention Significant bladder distention noted on CT imaging with patient's inability to urinate afterwards   In-N-Out catheter with 600 cc Unclear etiology, possibly related to spinal infection Foley catheter placed 11/26 Added tamsulosin  Failed voiding trial, foley replaced 12/6 She will need outpatient urology f/u in 2-3 weeks   ASCVD (arteriosclerotic cardiovascular disease) Continue home aspirin and metoprolol   Hyponatremia Appears to be chronic, stable Will monitor periodically   Thrombocytosis Likely reactive Will continue to monitor   Ambulatory dysfunction Infection has led to significant weakness She is likely to benefit from SNF rehab Of note, her husband is also down with a bad back and cannot help with care for her at home   She is quite opposed to rehab but appears to be reluctantly agreeable Awaiting placement        Normal weight based on BMI: Body mass index is 21.09 kg/m.  Underweight - under 18.5  normal weight - 18.5 to 24.9 overweight - 25 to 29.9 obese - 30 or more   DVT prophylaxis: lovenox  IV fluids: no continuous IV fluids  Nutrition: regular diet Central lines / invasive devices: Foley, PICC  Code Status: FULL CODE  ACP documentation reviewed: none on file in [facility]  TOC needs: placement Barriers to dispo / significant pending items: placement - Berkley Harvey is pending

## 2023-08-18 NOTE — Progress Notes (Signed)
Pharmacy Antibiotic Note  Jocelyn Hooper is a 82 y.o. female admitted on 08/08/2023 with bacteremia.  Pharmacy has been consulted for daptomycin dosing. Recently lumbar fusion in sept.  Treated for UTI in October (no cultures available, only UAs).    Today, 08/18/2023 Day # 9 of total abx. vancomycin to daptomycin + ceftaroline (day 2) Renal: SCr 0.66 WBC 13.5 Tm 103.5 > 98.9 11/24 blood cx: MRSA 11/25 MRI lumbar spine - no evidence of infection 11/25 MRI for Hip pain - Mild osteoarthritis of the left hip. No acute findings; Small bilateral hip joint effusions; Mild tendinosis of the bilateral gluteus medius and minimus tendons. Mild right peritrochanteric bursitis; Mild tendinosis of the bilateral hamstring tendon origins  11/26 blood cx: NG 11/29 blood cx: both sets MRSA 11/28 TEE - no vegetation 11/30 repeat MRI - concern for L4-5 discitis/osteomyelitis 12/2 blood cx: pending  Assessment: Vancomycin levels 11/27-28 Current regimen: Vancomycin 750 mg IV Q24H Preceding dose 11/27 @ 2118  Vanc pk 11/28 @ 0022: 16 ug/mL  Vanc tr 11/28 @ 1943: 8 ug/mL PK Calculations AUC: 282.4 Cmin: 7.6 T 1/2: 19.4hr  Plan: Due to persistent bacteremia and fever, continue daptomycin 500mg  IV q24h (9mg /kg) with addition of ceftaroline 600mg  IV q8h (will dose q8h as normalized CrCl and eGFR are > 81ml/min).  CK in am then weekly, not on statin Follow renal function and CBC  Height: 5\' 5"  (165.1 cm) Weight: 56 kg (123 lb 7.3 oz) IBW/kg (Calculated) : 57  Temp (24hrs), Avg:98.5 F (36.9 C), Min:97.7 F (36.5 C), Max:98.9 F (37.2 C)  Recent Labs  Lab 08/12/23 0022 08/12/23 1943 08/12/23 2055 08/13/23 0343 08/14/23 0525 08/15/23 0453 08/16/23 0432 08/17/23 0607 08/18/23 0418  WBC 15.3*  --  13.7*   < > 13.3* 13.5* 14.5* 12.8* 13.5*  CREATININE 0.61  --  0.64   < > 0.79 0.68 0.68 0.78 0.63  LATICACIDVEN 0.9  --  0.8  --   --   --   --   --   --   VANCOTROUGH  --  8*  --   --   --   --    --   --   --   VANCOPEAK 16*  --   --   --   --   --   --   --   --    < > = values in this interval not displayed.    Estimated Creatinine Clearance: 46.3 mL/min (by C-G formula based on SCr of 0.63 mg/dL).    Allergies  Allergen Reactions   Avelox [Moxifloxacin Hcl In Nacl] Other (See Comments)    Redness all over in about 5 minutes after taking   Codeine Nausea Only   Acyclovir And Related     Turn red all over    Shrimp [Shellfish Allergy]     Hives     Ciprofloxacin Rash   Moxifloxacin Hives and Rash   Sucralfate Rash    Itching     Antimicrobials this admission: 11/27 ceftriaxone X 1, 11/29-12/1 11/25 vancomycin >> 12/2 12/2: daptomycin + ceftaroline >>    Microbiology results:  11/24 UCx:  Multiple species present, suggest re-collection See above Bcx: MRSA.    Thank you for allowing pharmacy to be a part of this patient's care.  Paschal Dopp, PharmD, BCPS Work Cell: [phone] 08/18/2023 7:27 AM

## 2023-08-18 NOTE — Progress Notes (Signed)
Spoke with Dr. Ophelia Charter in person at patient's bedside and made her aware that patient's BP 91/51 MAP 64. HR 80-90. She has toprol XL 25 mg due along with MS contin and gabapentin. After discussion with patient, Dr. Ophelia Charter gave order to go ahead and give Toprol XL, MS contin and gabapentin.

## 2023-08-18 NOTE — Progress Notes (Signed)
Progress Note   Patient: Jocelyn Hooper ZOX:[id number] DOB: December 01, 1940 DOA: 08/08/2023     10 DOS: the patient was seen and examined on 08/18/2023   Brief hospital course: 82yo with h/o IDA/B12 deficiency scoliosis s/p intervention 06/09/2023 who presented on 12/3 with AMS.  She was septic with MRSA bacteremia related to discitis/osteomyelitis at L4-5.  TEE negative.  On Daptomycin and Ceftaroline.  Assessment and Plan:  Sepsis due to MRSA bacteremia/L4-5 osteomyelitis/discitis Sepsis POA as evidenced by fevers, tachycardia, tachypnea, leukocytosis Blood cultures +MRSA in all 4 bottles Patient presented with complaints of severe left hip pain and is status post recent L5-S1 decompression, instrumentation and fusion on 06/09/2023 Initial MRI of the lumbar spine negative but repeat with discitis/osteomyelitis at L4-L5 TEE negative for endocarditis on 11/28 ID is following IV Vancomycin -> IV Daptomycin and ceftaroline Initial repeat Bcx from 11/26 no growth & final Repeat blood cultures 11/29 again +MRSA  Repeat blood cultures again on 12/2 NTD x 2 days   Inadequate Pain Control  Pt becomes too confused with oxycodone and Norco has been ineffective   Discussed options with pt's daughter - will trial lowest dose of long-acting morphine and monitor (pt has nausea with codeine on allergy profile) Started MS Contin 15 mg BID -- seems tolerating well, continue Palliative care consulted for assistance Increased gabapentin to 300 TID (was just on at bedtime) Monitor for oversedation or confusion Goal of adequate pain control for mobility, pt was ambulating with walker just prior to admission Consider addition of Cymbalta as an outpatient   Acute urinary retention Significant bladder distention noted on CT imaging with patient's inability to urinate afterwards   In-N-Out catheter with 600 cc Unclear etiology, possibly related to spinal infection Foley catheter placed 11/26 Hold oxybutynin for  now Continue Foley for now given severe pain, voiding trial once pain control improved    Acute metabolic encephalopathy Secondary to sepsis Improved back to baseline when not febrile - mentation waxes and wanes with fevers Delirium precautions   ASCVD (arteriosclerotic cardiovascular disease) Continue home aspirin and metoprolol  Ambulatory dysfunction Infection has led to significant weakness She was unable to walk with PT due to tachycardia previously, likely related to insufficient pain control Continue to try to ambulate with PT She is likely to benefit from SNF rehab Of note, her husband is also down with a bad back and cannot help with care for her at home      Consultants: ID Cardiology Palliative care PT OT [facility] team  Procedures: TEE 11/27 Echocardiogram 11/28  Antibiotics: Vancomycin Daptomycin 12/2- Ceftaroline 12/2-  30 Day Unplanned Readmission Risk Score    Flowsheet Row ED to Hosp-Admission (Current) from 08/08/2023 in [facility] PCU  30 Day Unplanned Readmission Risk Score (%) 16.52 Filed at 08/18/2023 0801       This score is the patient's risk of an unplanned readmission within 30 days of being discharged (0 -100%). The score is based on dignosis, age, lab data, medications, orders, and past utilization.   Low:  0-14.9   Medium: 15-21.9   High: 22-29.9   Extreme: 30 and above           Subjective: Reports feeling ok.  Pain is controlled at rest.  Has not been up with PT due to tachycardia previously.   Objective: Vitals:   08/18/23 0454 08/18/23 0825  BP: (!) 100/57 (!) 91/51  Pulse: 89 86  Resp: 20   Temp: 97.7 F (36.5 C) 98.1  F (36.7 C)  SpO2: 100% 97%    Intake/Output Summary (Last 24 hours) at 08/18/2023 0832 Last data filed at 08/18/2023 1610 Gross per 24 hour  Intake 550 ml  Output 2000 ml  Net -1450 ml   Filed Weights   08/08/23 1050 08/09/23 0703 08/11/23 0722  Weight: 58.2 kg 56 kg 56 kg     Exam:  General:  Appears calm and comfortable and is in NAD Eyes:   EOMI, normal lids, iris ENT:  grossly normal hearing, lips & tongue, mmm Neck:  no LAD, masses or thyromegaly Cardiovascular:  RRR, no m/r/g. No LE edema.  Respiratory:   CTA bilaterally with no wheezes/rales/rhonchi.  Normal respiratory effort. Abdomen:  soft, NT, ND Skin:  no rash or induration seen on limited exam Musculoskeletal:  grossly normal tone BUE/BLE, no bony abnormality Psychiatric:  blunted mood and affect, speech fluent and appropriate, AOx3 Neurologic:  CN 2-12 grossly intact, moves all extremities in coordinated fashion  Data Reviewed: I have reviewed the patient's lab results since admission.  Pertinent labs for today include:   Na++ 131 Glucose 108 WBC 13.5 Hgb 8.8, stable Platelets 463     Family Communication: Friend was present throughout evaluation  Disposition: Status is: Inpatient Remains inpatient appropriate because: ongoing management     Time spent: 50 minutes  Unresulted Labs (From admission, onward)     Start     Ordered   08/17/23 0500  CK  Weekly,   TIMED     Question:  Specimen collection method  Answer:  Lab=Lab collect   08/16/23 1403             Author: Jonah Blue, MD 08/18/2023 8:32 AM  For on call review [email or web address].

## 2023-08-18 NOTE — Plan of Care (Signed)
  Problem: Activity: Goal: Risk for activity intolerance will decrease Outcome: Progressing   Problem: Pain Management: Goal: General experience of comfort will improve Outcome: Progressing   Problem: Safety: Goal: Ability to remain free from injury will improve Outcome: Progressing   Problem: Skin Integrity: Goal: Risk for impaired skin integrity will decrease Outcome: Progressing

## 2023-08-19 DIAGNOSIS — R52 Pain, unspecified: Secondary | ICD-10-CM | POA: Diagnosis not present

## 2023-08-19 DIAGNOSIS — B9562 Methicillin resistant Staphylococcus aureus infection as the cause of diseases classified elsewhere: Secondary | ICD-10-CM | POA: Diagnosis not present

## 2023-08-19 DIAGNOSIS — E538 Deficiency of other specified B group vitamins: Secondary | ICD-10-CM | POA: Diagnosis not present

## 2023-08-19 DIAGNOSIS — G9341 Metabolic encephalopathy: Secondary | ICD-10-CM | POA: Diagnosis not present

## 2023-08-19 DIAGNOSIS — R6520 Severe sepsis without septic shock: Secondary | ICD-10-CM | POA: Diagnosis not present

## 2023-08-19 DIAGNOSIS — R7881 Bacteremia: Secondary | ICD-10-CM | POA: Diagnosis not present

## 2023-08-19 DIAGNOSIS — M4317 Spondylolisthesis, lumbosacral region: Secondary | ICD-10-CM | POA: Diagnosis not present

## 2023-08-19 DIAGNOSIS — G253 Myoclonus: Secondary | ICD-10-CM | POA: Diagnosis not present

## 2023-08-19 DIAGNOSIS — Z515 Encounter for palliative care: Secondary | ICD-10-CM | POA: Diagnosis not present

## 2023-08-19 DIAGNOSIS — R338 Other retention of urine: Secondary | ICD-10-CM | POA: Diagnosis not present

## 2023-08-19 DIAGNOSIS — A419 Sepsis, unspecified organism: Secondary | ICD-10-CM | POA: Diagnosis not present

## 2023-08-19 DIAGNOSIS — A4101 Sepsis due to Methicillin susceptible Staphylococcus aureus: Secondary | ICD-10-CM | POA: Diagnosis not present

## 2023-08-19 DIAGNOSIS — M4646 Discitis, unspecified, lumbar region: Secondary | ICD-10-CM | POA: Diagnosis not present

## 2023-08-19 DIAGNOSIS — M4626 Osteomyelitis of vertebra, lumbar region: Secondary | ICD-10-CM | POA: Diagnosis not present

## 2023-08-19 LAB — CBC WITH DIFFERENTIAL/PLATELET
Abs Immature Granulocytes: 0.17 10*3/uL — ABNORMAL HIGH (ref 0.00–0.07)
Basophils Absolute: 0.1 10*3/uL (ref 0.0–0.1)
Basophils Relative: 0 %
Eosinophils Absolute: 0.1 10*3/uL (ref 0.0–0.5)
Eosinophils Relative: 1 %
HCT: 26.9 % — ABNORMAL LOW (ref 36.0–46.0)
Hemoglobin: 8.8 g/dL — ABNORMAL LOW (ref 12.0–15.0)
Immature Granulocytes: 1 %
Lymphocytes Relative: 7 %
Lymphs Abs: 1.2 10*3/uL (ref 0.7–4.0)
MCH: 30.8 pg (ref 26.0–34.0)
MCHC: 32.7 g/dL (ref 30.0–36.0)
MCV: 94.1 fL (ref 80.0–100.0)
Monocytes Absolute: 0.8 10*3/uL (ref 0.1–1.0)
Monocytes Relative: 5 %
Neutro Abs: 14.5 10*3/uL — ABNORMAL HIGH (ref 1.7–7.7)
Neutrophils Relative %: 86 %
Platelets: 540 10*3/uL — ABNORMAL HIGH (ref 150–400)
RBC: 2.86 MIL/uL — ABNORMAL LOW (ref 3.87–5.11)
RDW: 14.6 % (ref 11.5–15.5)
WBC: 16.9 10*3/uL — ABNORMAL HIGH (ref 4.0–10.5)
nRBC: 0 % (ref 0.0–0.2)

## 2023-08-19 LAB — MINIMUM INHIBITORY CONC. (1 DRUG): Requested Antibiotic 1: 20241124

## 2023-08-19 LAB — BASIC METABOLIC PANEL
Anion gap: 10 (ref 5–15)
BUN: 19 mg/dL (ref 8–23)
CO2: 20 mmol/L — ABNORMAL LOW (ref 22–32)
Calcium: 8.6 mg/dL — ABNORMAL LOW (ref 8.9–10.3)
Chloride: 103 mmol/L (ref 98–111)
Creatinine, Ser: 0.78 mg/dL (ref 0.44–1.00)
GFR, Estimated: >60 mL/min (ref >60)
Glucose, Bld: 115 mg/dL — ABNORMAL HIGH (ref 70–99)
Potassium: 4 mmol/L (ref 3.5–5.1)
Sodium: 133 mmol/L — ABNORMAL LOW (ref 135–145)

## 2023-08-19 MED ORDER — TAMSULOSIN HCL 0.4 MG PO CAPS
0.4000 mg | ORAL_CAPSULE | Freq: Every day | ORAL | Status: DC
  Administered 2023-08-19 – 2023-08-29 (×11): 0.4 mg via ORAL
  Filled 2023-08-19 (×11): qty 1

## 2023-08-19 NOTE — Progress Notes (Signed)
Progress Note   Patient: Jocelyn Hooper LKG:[id number] DOB: 23-May-1941 DOA: 08/08/2023     11 DOS: the patient was seen and examined on 08/19/2023   Brief hospital course: 82yo with h/o IDA/B12 deficiency scoliosis s/p intervention 06/09/2023 who presented on 12/3 with AMS.  She was septic with MRSA bacteremia related to discitis/osteomyelitis at L4-5.  TEE negative.  On Daptomycin and Ceftaroline.  Assessment and Plan:  Sepsis due to MRSA bacteremia/L4-5 osteomyelitis/discitis Sepsis POA as evidenced by fevers, tachycardia, tachypnea, leukocytosis Blood cultures +MRSA in all 4 bottles Patient presented with complaints of severe left hip pain and is status post recent L5-S1 decompression, instrumentation and fusion on 06/09/2023 Initial MRI of the lumbar spine negative but repeat with discitis/osteomyelitis at L4-L5 TEE negative for endocarditis on 11/28 ID is following IV Vancomycin -> IV Daptomycin and ceftaroline Initial repeat Bcx from 11/26 no growth & final Repeat blood cultures 11/29 again +MRSA  Repeat blood cultures again on 12/2 NTD x 3 days Plan for PICC placement tomorrow   Inadequate Pain Control  Pt becomes too confused with oxycodone and Norco has been ineffective   Discussed options with pt's daughter - will trial lowest dose of long-acting morphine and monitor (pt has nausea with codeine on allergy profile) Started MS Contin 15 mg BID -- seems tolerating well, continue Palliative care consulted for assistance Increased gabapentin to 300 TID (was just on at bedtime) Monitor for oversedation or confusion Goal of adequate pain control for mobility, pt was ambulating with walker just prior to admission Consider addition of Cymbalta as an outpatient   Acute urinary retention Significant bladder distention noted on CT imaging with patient's inability to urinate afterwards   In-N-Out catheter with 600 cc Unclear etiology, possibly related to spinal infection Foley catheter  placed 11/26 Hold oxybutynin for now Will add tamsulosin today, attempt voiding trial tomorrow   Acute metabolic encephalopathy Secondary to sepsis Improved back to baseline s Delirium precautions   ASCVD (arteriosclerotic cardiovascular disease) Continue home aspirin and metoprolol   Ambulatory dysfunction Infection has led to significant weakness She was unable to walk with PT due to tachycardia previously, likely related to insufficient pain control Continue to try to ambulate with PT She is likely to benefit from SNF rehab Of note, her husband is also down with a bad back and cannot help with care for her at home   She is quite opposed to rehab but also is very unlikely to be successful at home - needs ongoing conversations with patient and family       Consultants: ID Cardiology Palliative care PT OT [facility] team   Procedures: TEE 11/27 Echocardiogram 11/28   Antibiotics: Vancomycin Daptomycin 12/2- Ceftaroline 12/2-   30 Day Unplanned Readmission Risk Score    Flowsheet Row ED to Hosp-Admission (Current) from 08/08/2023 in [facility] PCU  30 Day Unplanned Readmission Risk Score (%) 16.77 Filed at 08/19/2023 0801       This score is the patient's risk of an unplanned readmission within 30 days of being discharged (0 -100%). The score is based on dignosis, age, lab data, medications, orders, and past utilization.   Low:  0-14.9   Medium: 15-21.9   High: 22-29.9   Extreme: 30 and above           Subjective: Reports pain is controlled.  Resists getting out of bed, per nursing.  Will try pre-medication.   Objective: Vitals:   08/19/23 1100 08/19/23 1539  BP: 115/67 Marland Kitchen)  107/54  Pulse: 84 71  Resp:  16  Temp:  98.2 F (36.8 C)  SpO2:  99%    Intake/Output Summary (Last 24 hours) at 08/19/2023 1605 Last data filed at 08/19/2023 0500 Gross per 24 hour  Intake 50 ml  Output 300 ml  Net -250 ml   Filed Weights   08/08/23 1050  08/09/23 0703 08/11/23 0722  Weight: 58.2 kg 56 kg 56 kg    Exam:  General:  Appears calm and comfortable and is in NAD Eyes:   EOMI, normal lids, iris ENT:  grossly normal hearing, lips & tongue, mmm Neck:  no LAD, masses or thyromegaly Cardiovascular:  RRR, no m/r/g. No LE edema.  Respiratory:   CTA bilaterally with no wheezes/rales/rhonchi.  Normal respiratory effort. Abdomen:  soft, NT, ND Skin:  no rash or induration seen on limited exam Musculoskeletal:  grossly normal tone BUE/BLE, no bony abnormality Psychiatric:  blunted mood and affect, speech fluent and appropriate, AOx3 Neurologic:  CN 2-12 grossly intact, moves all extremities in coordinated fashion  Data Reviewed: I have reviewed the patient's lab results since admission.  Pertinent labs for today include:   Na++ 133 Glucose 115 WBC 16.9 Hgb 8.8 - stable Platelets 540 Repeat blood cultures NTD x 2 days     Family Communication: None present; I was unable to reach her husband by telephone and have requested the number for her daughter so that I can speak with her  Disposition: Status is: Inpatient Remains inpatient appropriate because: ongoing management     Time spent: 50 minutes  Unresulted Labs (From admission, onward)     Start     Ordered   08/20/23 0500  Sedimentation rate  Tomorrow morning,   R       Question:  Specimen collection method  Answer:  Lab=Lab collect   08/19/23 1533   08/20/23 0500  C-reactive protein  Tomorrow morning,   R       Question:  Specimen collection method  Answer:  Lab=Lab collect   08/19/23 1533   08/20/23 0500  CBC with Differential/Platelet  Tomorrow morning,   R       Question:  Specimen collection method  Answer:  Lab=Lab collect   08/19/23 1605   08/20/23 0500  Basic metabolic panel  Tomorrow morning,   R       Question:  Specimen collection method  Answer:  Lab=Lab collect   08/19/23 1605   08/17/23 0500  CK  Weekly,   TIMED     Question:  Specimen  collection method  Answer:  Lab=Lab collect   08/16/23 1403             Author: Jonah Blue, MD 08/19/2023 4:05 PM  For on call review [email or web address].

## 2023-08-19 NOTE — Progress Notes (Signed)
Date of Admission:  08/08/2023     ID: Jocelyn Hooper is a 82 y.o. female  Principal Problem:   Sepsis with encephalopathy without septic shock (HCC) Active Problems:   Mixed hyperlipidemia   GERD (gastroesophageal reflux disease)   ASCVD (arteriosclerotic cardiovascular disease)   B12 deficiency   Trigeminal neuralgia syndrome   Spondylolisthesis of lumbosacral region   Acute urinary retention   Altered mental status   MRSA bacteremia   Discitis of lumbar region   Inadequate pain control  On 06/09/23 underwent L5-S1 decompression with fusion at [facility] B/l TKA ( Left 2013, Rt 2019) Left hip pain- says she has bursitits and gets injection  MRI hip showed bursa in rt peritrochanteric area  Subjective: Pt in pain  No feeling well   Medications:   acetaminophen  650 mg Oral Q6H   Or   acetaminophen  650 mg Rectal Q6H   aspirin EC  81 mg Oral Daily   Chlorhexidine Gluconate Cloth  6 each Topical Daily   enoxaparin (LOVENOX) injection  40 mg Subcutaneous Q24H   estradiol  0.5 mg Oral Daily   feeding supplement  237 mL Oral BID BM   fluticasone  1 spray Each Nare Daily   gabapentin  300 mg Oral TID   loratadine  10 mg Oral Daily   metoprolol succinate  25 mg Oral Daily   morphine  15 mg Oral Q12H   pantoprazole  40 mg Oral Daily   polyethylene glycol  17 g Oral Daily   senna-docusate  1 tablet Oral BID   sodium chloride flush  3 mL Intravenous Q12H   topiramate  150 mg Oral QHS    Objective: Vital signs in last 24 hours: Patient Vitals for the past 24 hrs:  BP Temp Temp src Pulse Resp SpO2  08/19/23 1100 115/67 -- -- 84 -- --  08/19/23 0816 115/67 98.2 F (36.8 C) Oral 84 16 96 %  08/19/23 0458 (!) 97/55 98.5 F (36.9 C) -- 88 20 98 %  08/19/23 0000 -- -- -- -- 20 --  08/18/23 2321 (!) 95/57 99.7 F (37.6 C) -- (!) 103 20 96 %  08/18/23 2200 -- 99.2 F (37.3 C) Oral -- -- --  08/18/23 2046 109/60 (!) 100.4 F (38 C) -- (!) 105 20 96 %  08/18/23 1625 (!) 99/52  98.4 F (36.9 C) Oral 79 16 99 %     LDA Foley   PHYSICAL EXAM:  General:alert , in  distress Lumbar surgical scar- has a scab Lungs: Clear to auscultation bilaterally. No Wheezing or Rhonchi. No rales. Heart: Regular rate and rhythm, no murmur, rub or gallop. Abdomen: Soft, non-tender,not distended. Bowel sounds normal. No masses Extremities: atraumatic, no cyanosis. No edema. No clubbing B/l knee surgical scars foley Skin: No rashes or lesions. Or bruising Lymph: Cervical, supraclavicular normal. Neurologic: Grossly non-focal  Lab Results    Latest Ref Rng & Units 08/19/2023    5:41 AM 08/18/2023    4:18 AM 08/17/2023    6:07 AM  CBC  WBC 4.0 - 10.5 K/uL 16.9  13.5  12.8   Hemoglobin 12.0 - 15.0 g/dL 8.8  8.8  8.7   Hematocrit 36.0 - 46.0 % 26.9  26.6  25.4   Platelets 150 - 400 K/uL 540  463  434        Latest Ref Rng & Units 08/19/2023    5:41 AM 08/18/2023    4:18 AM 08/17/2023  6:07 AM  CMP  Glucose 70 - 99 mg/dL 914  782  956   BUN 8 - 23 mg/dL 19  17  18    Creatinine 0.44 - 1.00 mg/dL 2.13  0.86  5.78   Sodium 135 - 145 mmol/L 133  131  132   Potassium 3.5 - 5.1 mmol/L 4.0  3.9  3.8   Chloride 98 - 111 mmol/L 103  104  105   CO2 22 - 32 mmol/L 20  23  20    Calcium 8.9 - 10.3 mg/dL 8.6  8.3  8.3       Microbiology: 08/08/23- 4/4 MRSA bacteremia 08/10/23-BC - ng 11/29 BC + mRSA 12/2 Bc    Assessment/Plan: MRSA bacteremia  Recent spine fusion 06/09/23 underwent L5-S1 laminotomy, facectomy and posterior lumbar interbody fusion High bio-burden Now has  discitis/osteo a L4-L5 area Edematous paraspinal soft tissue at L5 Partial collapse of L5 vertebral body TEE no endocarditis Leucocytosis- stable Pt on dual  dapto and ceftaroline (Dual MRSA covergae) since 12/2 because of persistent fever and bacteremia- now fever has resolved  will treat with combo for atlest 7-10 days  until 08/25/23 and then single antibiotic Daptomycin - will need 6 weeks minimum  until 09/27/23  MRI brain small vessel disease Repeat MRI lumbar spine- discitis L4/L5, edematous paraspinal tissue at l5 Repeat blood culture from 11/29 positive for gram positive cocci Repeat blood culture from 12/2 so far negative  Because of worsening leucocytosis and low grade fever will discuss with radiologist regarding imaging   Encephalopathy- resolved  Urinary retention- has foley    Discussed with patient and hospitalist

## 2023-08-19 NOTE — Progress Notes (Signed)
Occupational Therapy Treatment Patient Details Name: Jocelyn Hooper MRN: [id number] DOB: [birth year] Today's Date: 08/19/2023   History of present illness Pt is an 82 yo F presenting due to AMS, with MRSA, sepsis, acute urinary retention, acute metabolic encephalopathy, arteriosclerotic CVD, and hypokalemia. PMH significant for L5-S1 TLIF (06/09/2023), anemia, L shoulder bursitis, CAD, CSF leak, IBS, Scoliosis, Trigeminal neuralgia, and B TKR.   OT comments  Pt premedicated prior to session, 0/10 pain. Agreeable to attempt sitting EOB, OT offers coming back at a later time or when best suited to pt's needs. Pt initially attempts to move both LE towards EOB, but becomes resistive to movement and requires total A +2 to transition safely. Unable to tolerate full transition to sitting EOB, and opted to return pt to supine. Pt then stating 8/10 pain in abdomen. RN and care team notified. Difficult to fully determine PLOF - chart review states pt ambulatory with RW prior to admission, yet pt states today she has not walked in "months". OT will continue to follow for functional gains.       If plan is discharge home, recommend the following:  Help with stairs or ramp for entrance;Assist for transportation;Assistance with cooking/housework;Two people to help with walking and/or transfers;Two people to help with bathing/dressing/bathroom   Equipment Recommendations  Other (comment)       Precautions / Restrictions Precautions Precautions: Fall Restrictions Weight Bearing Restrictions: No       Mobility Bed Mobility Overal bed mobility: Needs Assistance Bed Mobility: Rolling, Sidelying to Sit, Sit to Sidelying Rolling: Total assist, +2 for physical assistance Sidelying to sit: +2 for physical assistance, Total assist Supine to sit: Total assist, +2 for physical assistance     General bed mobility comments: pt expressing desire to attempt sitting EOB, attempts to move both LLE in transion but  resists movements and guards posture. Opted to return to supine with pt's level of resistance    Transfers                   General transfer comment: not attempted     Balance Overall balance assessment: Needs assistance Sitting-balance support: Feet supported, Bilateral upper extremity supported Sitting balance-Leahy Scale: Zero Sitting balance - Comments: unable                                   ADL either performed or assessed with clinical judgement   ADL Overall ADL's : Needs assistance/impaired                                              Cognition Arousal: Alert Behavior During Therapy: Flat affect Overall Cognitive Status: Impaired/Different from baseline Area of Impairment: Attention, Following commands, Awareness                 Orientation Level: Disoriented to, Situation     Following Commands: Follows one step commands inconsistently   Awareness: Intellectual                       Pertinent Vitals/ Pain       Pain Assessment Pain Assessment: No/denies pain (No pain start of session.) Pain Score: 8  Pain Location: R abdomen Pain Descriptors / Indicators: Grimacing, Discomfort, Guarding Pain Intervention(s): Premedicated before session, Limited activity within  patient's tolerance, Monitored during session   Frequency  Min 1X/week        Progress Toward Goals  OT Goals(current goals can now be found in the care plan section)  Progress towards OT goals: Progressing toward goals      AM-PAC OT "6 Clicks" Daily Activity     Outcome Measure   Help from another person eating meals?: None Help from another person taking care of personal grooming?: A Little Help from another person toileting, which includes using toliet, bedpan, or urinal?: Total Help from another person bathing (including washing, rinsing, drying)?: Total Help from another person to put on and taking off regular upper body  clothing?: Total Help from another person to put on and taking off regular lower body clothing?: Total 6 Click Score: 11    End of Session    OT Visit Diagnosis: Muscle weakness (generalized) (M62.81);Other abnormalities of gait and mobility (R26.89);Unsteadiness on feet (R26.81)   Activity Tolerance Patient limited by pain   Patient Left in bed;with bed alarm set;with call bell/phone within reach   Nurse Communication Mobility status;Patient requests pain meds;Other (comment)        Time: 7253-6644 OT Time Calculation (min): 15 min  Charges: OT General Charges $OT Visit: 1 Visit OT Treatments $Self Care/Home Management : 8-22 mins Pepper Wyndham L. Delwin Raczkowski, OTR/L  08/19/23, 4:59 PM

## 2023-08-19 NOTE — Progress Notes (Signed)
Palliative Care Progress Note, Assessment & Plan   Patient Name: Jocelyn Hooper       Date: 08/19/2023 DOB: Dec 26, 1940  Age: 82 y.o. MRN#: [id number] Attending Physician: Jonah Blue, MD Primary Care Physician: Danella Penton, MD Admit Date: 08/08/2023  Subjective: This lying in bed in no apparent distress.  She is awake and watching TV.  She acknowledges my presence and is able to make her wishes known.  She is more perky and seems to have more energy today.  She shares she did not get much rest last night but feels better today.  She remembers working with PT this morning.  No family or friends present during my visit.  HPI: 82 y.o. female  with past medical history of GERD, B12 deficiency, trigeminal neuralgia syndrome, iron deficiency anemia, arteriosclerotic disease, nephrolithiasis, bilateral TKA, spondylolisthesis of lumbosacral region, discitis of lumbar region, scoliosis s/p recent intervention (L5-S1 decompression with fusion 06/09/2023) admitted on 08/08/2023 with AMS and left hip pain.   From workup, patient found to have MRSA bacteremia and ID was consulted.  IV vancomycin initiated but subsequently changed to Dapto and Ceftin Rolan 9 dual MRSA coverage.   MRI of lumbar spine repeat cecitis of L4/L5 and edematous paraspinal tissue at L5.   Patient experiencing urinary retention and Foley placed.    TEE revealed no endocarditis.   Plan remains to continue blood cultures until clear of bacteremia and PICC cannot be placed until that time.   PMT was consulted for "uncontrolled pain".  Summary of counseling/coordination of care: Extensive chart review completed prior to meeting patient including labs, vital signs, imaging, progress notes, orders, and available advanced directive documents from  current and previous encounters.   After reviewing the patient's chart and assessing the patient at bedside, spoke with patient regards to symptom management.  Patient endorses that her pain is much better controlled.  She is happy that she is not having the spikes in fevers as well.  However, she endorses significant pain with movement with PT.  She shares she wants to be able to participate but that the pain is almost overwhelming.  I reminded her that she has Norco available.  She is requesting that she gets it at least 30 minutes before moving with therapies.  The note is in the order but I shared I would make her wishes known to the medical team.  Patient has no other acute complaints at this time.  I again offered to discuss boundaries and goals of care.  She says she is going to continue to talk with her family when the time is appropriate.  I reminded her that advance care planning is always useful so that it lives the burden off of family members and her wishes are made known to everyone.  PMT will continue to follow and support patient and family throughout her hospitalization.  Secure chat sent to medical team including PT and OT to request that patient receives pain medication as needed prior to working with therapies.  Physical Exam Vitals reviewed.  Constitutional:      General: She is not in acute distress.    Appearance: She is normal weight.  HENT:  Head: Normocephalic.     Mouth/Throat:     Mouth: Mucous membranes are moist.  Eyes:     Pupils: Pupils are equal, round, and reactive to light.  Cardiovascular:     Rate and Rhythm: Normal rate.  Pulmonary:     Effort: Pulmonary effort is normal.  Abdominal:     Palpations: Abdomen is soft.  Skin:    General: Skin is warm and dry.  Neurological:     Mental Status: She is alert and oriented to person, place, and time.  Psychiatric:        Mood and Affect: Mood normal.        Behavior: Behavior normal.        Thought  Content: Thought content normal.        Judgment: Judgment normal.             Total Time 35 minutes   Time spent includes: Detailed review of medical records (labs, imaging, vital signs), medically appropriate exam (mental status, respiratory, cardiac, skin), discussed with treatment team, counseling and educating patient, family and staff, documenting clinical information, medication management and coordination of care.  [name], DNP, FNP-BC Palliative Medicine Team

## 2023-08-19 NOTE — Progress Notes (Signed)
Notified Dr. Ophelia Charter that patient had 14 beat run of SVT and now back in NSR rate 70-80. MD acknowledged, no orders given.

## 2023-08-19 NOTE — Progress Notes (Signed)
Dr. Ophelia Charter gave order to discontinue foley catheter tomorrow morning and perform voiding trial.

## 2023-08-20 ENCOUNTER — Inpatient Hospital Stay: Payer: PPO

## 2023-08-20 ENCOUNTER — Encounter: Payer: Self-pay | Admitting: Radiology

## 2023-08-20 DIAGNOSIS — R52 Pain, unspecified: Secondary | ICD-10-CM | POA: Diagnosis not present

## 2023-08-20 DIAGNOSIS — E538 Deficiency of other specified B group vitamins: Secondary | ICD-10-CM | POA: Diagnosis not present

## 2023-08-20 DIAGNOSIS — M419 Scoliosis, unspecified: Secondary | ICD-10-CM | POA: Diagnosis not present

## 2023-08-20 DIAGNOSIS — M4626 Osteomyelitis of vertebra, lumbar region: Secondary | ICD-10-CM | POA: Diagnosis not present

## 2023-08-20 DIAGNOSIS — G253 Myoclonus: Secondary | ICD-10-CM | POA: Diagnosis not present

## 2023-08-20 DIAGNOSIS — A4101 Sepsis due to Methicillin susceptible Staphylococcus aureus: Secondary | ICD-10-CM | POA: Diagnosis not present

## 2023-08-20 DIAGNOSIS — R338 Other retention of urine: Secondary | ICD-10-CM | POA: Diagnosis not present

## 2023-08-20 DIAGNOSIS — R6520 Severe sepsis without septic shock: Secondary | ICD-10-CM | POA: Diagnosis not present

## 2023-08-20 DIAGNOSIS — M545 Low back pain, unspecified: Secondary | ICD-10-CM | POA: Diagnosis not present

## 2023-08-20 DIAGNOSIS — R7881 Bacteremia: Secondary | ICD-10-CM | POA: Diagnosis not present

## 2023-08-20 DIAGNOSIS — A419 Sepsis, unspecified organism: Secondary | ICD-10-CM | POA: Diagnosis not present

## 2023-08-20 DIAGNOSIS — B9562 Methicillin resistant Staphylococcus aureus infection as the cause of diseases classified elsewhere: Secondary | ICD-10-CM | POA: Diagnosis not present

## 2023-08-20 DIAGNOSIS — M4317 Spondylolisthesis, lumbosacral region: Secondary | ICD-10-CM | POA: Diagnosis not present

## 2023-08-20 DIAGNOSIS — M4646 Discitis, unspecified, lumbar region: Secondary | ICD-10-CM | POA: Diagnosis not present

## 2023-08-20 DIAGNOSIS — G9341 Metabolic encephalopathy: Secondary | ICD-10-CM | POA: Diagnosis not present

## 2023-08-20 DIAGNOSIS — Z515 Encounter for palliative care: Secondary | ICD-10-CM | POA: Diagnosis not present

## 2023-08-20 LAB — CBC WITH DIFFERENTIAL/PLATELET
Abs Immature Granulocytes: 0.19 10*3/uL — ABNORMAL HIGH (ref 0.00–0.07)
Basophils Absolute: 0.1 10*3/uL (ref 0.0–0.1)
Basophils Relative: 0 %
Eosinophils Absolute: 0.2 10*3/uL (ref 0.0–0.5)
Eosinophils Relative: 1 %
HCT: 29 % — ABNORMAL LOW (ref 36.0–46.0)
Hemoglobin: 9.2 g/dL — ABNORMAL LOW (ref 12.0–15.0)
Immature Granulocytes: 1 %
Lymphocytes Relative: 8 %
Lymphs Abs: 1.3 10*3/uL (ref 0.7–4.0)
MCH: 30.5 pg (ref 26.0–34.0)
MCHC: 31.7 g/dL (ref 30.0–36.0)
MCV: 96 fL (ref 80.0–100.0)
Monocytes Absolute: 0.6 10*3/uL (ref 0.1–1.0)
Monocytes Relative: 4 %
Neutro Abs: 13.2 10*3/uL — ABNORMAL HIGH (ref 1.7–7.7)
Neutrophils Relative %: 86 %
Platelets: 551 10*3/uL — ABNORMAL HIGH (ref 150–400)
RBC: 3.02 MIL/uL — ABNORMAL LOW (ref 3.87–5.11)
RDW: 14.6 % (ref 11.5–15.5)
WBC: 15.6 10*3/uL — ABNORMAL HIGH (ref 4.0–10.5)
nRBC: 0 % (ref 0.0–0.2)

## 2023-08-20 LAB — BASIC METABOLIC PANEL
Anion gap: 10 (ref 5–15)
BUN: 17 mg/dL (ref 8–23)
CO2: 20 mmol/L — ABNORMAL LOW (ref 22–32)
Calcium: 8.7 mg/dL — ABNORMAL LOW (ref 8.9–10.3)
Chloride: 103 mmol/L (ref 98–111)
Creatinine, Ser: 0.75 mg/dL (ref 0.44–1.00)
GFR, Estimated: >60 mL/min (ref >60)
Glucose, Bld: 108 mg/dL — ABNORMAL HIGH (ref 70–99)
Potassium: 4 mmol/L (ref 3.5–5.1)
Sodium: 133 mmol/L — ABNORMAL LOW (ref 135–145)

## 2023-08-20 LAB — C-REACTIVE PROTEIN: CRP: 14.9 mg/dL — ABNORMAL HIGH (ref <1.0)

## 2023-08-20 LAB — SEDIMENTATION RATE: Sed Rate: 128 mm/h — ABNORMAL HIGH (ref 0–30)

## 2023-08-20 MED ORDER — GADOBUTROL 1 MMOL/ML IV SOLN
6.0000 mL | Freq: Once | INTRAVENOUS | Status: AC | PRN
  Administered 2023-08-20: 6 mL via INTRAVENOUS

## 2023-08-20 NOTE — Plan of Care (Signed)
  Problem: Coping: Goal: Level of anxiety will decrease Outcome: Progressing   Problem: Pain Management: Goal: General experience of comfort will improve Outcome: Progressing

## 2023-08-20 NOTE — Plan of Care (Signed)

## 2023-08-20 NOTE — Progress Notes (Signed)
Date of Admission:  08/08/2023     ID: Jocelyn Hooper is a 82 y.o. female  Principal Problem:   Sepsis with encephalopathy without septic shock (HCC) Active Problems:   Mixed hyperlipidemia   GERD (gastroesophageal reflux disease)   ASCVD (arteriosclerotic cardiovascular disease)   B12 deficiency   Trigeminal neuralgia syndrome   Spondylolisthesis of lumbosacral region   Acute urinary retention   Altered mental status   MRSA bacteremia   Discitis of lumbar region   Inadequate pain control  On 06/09/23 underwent L5-S1 decompression with fusion at [facility] B/l TKA ( Left 2013, Rt 2019) Left hip pain- says she has bursitits and gets injection  MRI hip showed bursa in rt peritrochanteric area  Subjective: Pt has not worked with PT due to pain Daughter at bed side  Medications:   acetaminophen  650 mg Oral Q6H   Or   acetaminophen  650 mg Rectal Q6H   aspirin EC  81 mg Oral Daily   Chlorhexidine Gluconate Cloth  6 each Topical Daily   enoxaparin (LOVENOX) injection  40 mg Subcutaneous Q24H   estradiol  0.5 mg Oral Daily   feeding supplement  237 mL Oral BID BM   fluticasone  1 spray Each Nare Daily   gabapentin  300 mg Oral TID   loratadine  10 mg Oral Daily   metoprolol succinate  25 mg Oral Daily   morphine  15 mg Oral Q12H   pantoprazole  40 mg Oral Daily   polyethylene glycol  17 g Oral Daily   senna-docusate  1 tablet Oral BID   sodium chloride flush  3 mL Intravenous Q12H   tamsulosin  0.4 mg Oral QPC supper   topiramate  150 mg Oral QHS    Objective: Vital signs in last 24 hours: Patient Vitals for the past 24 hrs:  BP Temp Temp src Pulse Resp SpO2  08/20/23 0851 111/65 97.8 F (36.6 C) Oral 82 17 97 %  08/20/23 0333 (!) 104/58 98.7 F (37.1 C) -- 92 17 96 %  08/20/23 0013 (!) 102/51 98.6 F (37 C) -- 86 17 98 %  08/19/23 2058 (!) 102/50 98.1 F (36.7 C) -- 86 14 97 %  08/19/23 1539 (!) 107/54 98.2 F (36.8 C) Oral 71 16 99 %  08/19/23 1100 115/67 -- -- 84  -- --     LDA Foley   PHYSICAL EXAM:  General:alert , frustrated Lumbar surgical scar- has a scab Lungs: Clear to auscultation bilaterally. No Wheezing or Rhonchi. No rales. Heart: Regular rate and rhythm, no murmur, rub or gallop. Abdomen: Soft, non-tender,not distended. Bowel sounds normal. No masses Extremities: atraumatic, no cyanosis. No edema. No clubbing B/l knee surgical scars foley Skin: No rashes or lesions. Or bruising Lymph: Cervical, supraclavicular normal. Neurologic: Grossly non-focal  Lab Results    Latest Ref Rng & Units 08/20/2023    5:47 AM 08/19/2023    5:41 AM 08/18/2023    4:18 AM  CBC  WBC 4.0 - 10.5 K/uL 15.6  16.9  13.5   Hemoglobin 12.0 - 15.0 g/dL 9.2  8.8  8.8   Hematocrit 36.0 - 46.0 % 29.0  26.9  26.6   Platelets 150 - 400 K/uL 551  540  463        Latest Ref Rng & Units 08/20/2023    5:47 AM 08/19/2023    5:41 AM 08/18/2023    4:18 AM  CMP  Glucose 70 - 99 mg/dL 010  115  108   BUN 8 - 23 mg/dL 17  19  17    Creatinine 0.44 - 1.00 mg/dL 2.84  1.32  4.40   Sodium 135 - 145 mmol/L 133  133  131   Potassium 3.5 - 5.1 mmol/L 4.0  4.0  3.9   Chloride 98 - 111 mmol/L 103  103  104   CO2 22 - 32 mmol/L 20  20  23    Calcium 8.9 - 10.3 mg/dL 8.7  8.6  8.3       Microbiology: 08/08/23- 4/4 MRSA bacteremia 08/10/23-BC - ng 11/29 BC + mRSA 12/2 Bc    Assessment/Plan: MRSA bacteremia  Recent spine fusion 06/09/23 underwent L5-S1 laminotomy, facectomy and posterior lumbar interbody fusion High bio-burden Now has  discitis/osteo a L4-L5 area Edematous paraspinal soft tissue at L5 Partial collapse of L5 vertebral body TEE no endocarditis Leucocytosis- fluctuates Pt on dual  dapto and ceftaroline (Dual MRSA covergae) since 12/2 because of persistent fever and bacteremia- now fever has resolved  will treat with combo for atlest 7-10 days  until 08/25/23 and then single antibiotic Daptomycin - will need 6 weeks minimum until 09/27/23  MRI brain  small vessel disease Repeat MRI lumbar spine- discitis L4/L5, edematous paraspinal tissue at l5 Repeat blood culture from 11/29 positive for gram positive cocci Repeat blood culture from 12/2 so far negative   Because of worsening leucocytosis and low grade fever  discussed with radiologist regarding imaging to look for drainable collection- repeat MRI lumbar with and without contrast. Explained the rationale for repeat imaging a week apart to the patient and daughter   Encephalopathy- resolved  Urinary retention- has foley, failed voiding trial    Discussed with hospitalist ID will not follow her routinely this weekend- on call ID available for urgent questions, call if needed

## 2023-08-20 NOTE — Progress Notes (Signed)
Notified Dr. Ophelia Charter that patient has not voided since foley catheter removed this morning. Bladder scan result 369 cc. RN asked MD about in and out cath. MD gave order to insert foley for retention.

## 2023-08-20 NOTE — Progress Notes (Signed)
Notified Dr. Ophelia Charter that patient refused her morning dose of Gabapentin.

## 2023-08-20 NOTE — Progress Notes (Signed)
Pharmacy Antibiotic Note  Jocelyn Hooper is a 82 y.o. female admitted on 08/08/2023 with bacteremia.  Pharmacy has been consulted for daptomycin dosing. Recently lumbar fusion in sept.  Treated for UTI in October (no cultures available, only UAs).    Today, 08/20/2023 Day # 12 of total abx. Day 5 of daptomycin + ceftaroline  Renal: SCr stable WBC 15.6 Afebrile last 24h 11/24 blood cx: MRSA 11/25 MRI lumbar spine - no evidence of infection 11/25 MRI for Hip pain - Mild osteoarthritis of the left hip. No acute findings; Small bilateral hip joint effusions; Mild tendinosis of the bilateral gluteus medius and minimus tendons. Mild right peritrochanteric bursitis; Mild tendinosis of the bilateral hamstring tendon origins  11/26 blood cx: NG 11/29 blood cx: both sets MRSA 11/28 TEE - no vegetation 11/30 repeat MRI - concern for L4-5 discitis/osteomyelitis 12/2 blood cx: NGTD 12/3 CK 16  Vancomycin levels: Vancomycin levels 11/27-28 Current regimen: Vancomycin 750 mg IV Q24H Preceding dose 11/27 @ 2118  Vanc pk 11/28 @ 0022: 16 ug/mL  Vanc tr 11/28 @ 1943: 8 ug/mL PK Calculations AUC: 282.4 Cmin: 7.6 T 1/2: 19.4hr  Plan: Due to persistent bacteremia and fever, continue daptomycin 500mg  IV q24h (9mg /kg) and ceftaroline 600mg  IV q8h (will dose q8h as normalized CrCl and eGFR are > 57ml/min).  ID planning to do combo therapy for 7-10 days then likely daptomycin monotherapy CK  weekly, not on statin Follow renal function and CBC  Height: 5\' 5"  (165.1 cm) Weight: 56 kg (123 lb 7.3 oz) IBW/kg (Calculated) : 57  Temp (24hrs), Avg:98.3 F (36.8 C), Min:97.8 F (36.6 C), Max:98.7 F (37.1 C)  Recent Labs  Lab 08/16/23 0432 08/17/23 0607 08/18/23 0418 08/19/23 0541 08/20/23 0547  WBC 14.5* 12.8* 13.5* 16.9* 15.6*  CREATININE 0.68 0.78 0.63 0.78 0.75    Estimated Creatinine Clearance: 46.3 mL/min (by C-G formula based on SCr of 0.75 mg/dL).    Allergies  Allergen Reactions    Avelox [Moxifloxacin Hcl In Nacl] Other (See Comments)    Redness all over in about 5 minutes after taking   Codeine Nausea Only   Acyclovir And Related     Turn red all over    Shrimp [Shellfish Allergy]     Hives     Ciprofloxacin Rash   Moxifloxacin Hives and Rash   Sucralfate Rash    Itching     Antimicrobials this admission: 11/27 ceftriaxone X 1, 11/29-12/1 11/25 vancomycin >> 12/2 12/2: daptomycin + ceftaroline >>    Microbiology results:  11/24 UCx:  Multiple species present, suggest re-collection See above Bcx: MRSA.    Thank you for allowing pharmacy to be a part of this patient's care.  Juliette Alcide, PharmD, BCPS, BCIDP Work Cell: [phone] 08/20/2023 10:27 AM

## 2023-08-20 NOTE — Progress Notes (Signed)
                                                     Palliative Care Progress Note, Assessment & Plan   Patient Name: Jocelyn Hooper       Date: 08/20/2023 DOB: 02-Mar-1941  Age: 82 y.o. MRN#: [id number] Attending Physician: Jonah Blue, MD Primary Care Physician: Danella Penton, MD Admit Date: 08/08/2023  Subjective: Patient is sleeping in no apparent distress.  Respirations are even and unlabored.  I friend is at bedside visiting.  HPI: 82 y.o. female  with past medical history of GERD, B12 deficiency, trigeminal neuralgia syndrome, iron deficiency anemia, arteriosclerotic disease, nephrolithiasis, bilateral TKA, spondylolisthesis of lumbosacral region, discitis of lumbar region, scoliosis s/p recent intervention (L5-S1 decompression with fusion 06/09/2023) admitted on 08/08/2023 with AMS and left hip pain.   From workup, patient found to have MRSA bacteremia and ID was consulted.  IV vancomycin initiated but subsequently changed to Dapto and Ceftin Rolan 9 dual MRSA coverage.   MRI of lumbar spine repeat cecitis of L4/L5 and edematous paraspinal tissue at L5.   Patient experiencing urinary retention and Foley placed.    TEE revealed no endocarditis.   Plan remains to continue blood cultures until clear of bacteremia and PICC cannot be placed until that time.   PMT was consulted for "uncontrolled pain".  Summary of counseling/coordination of care: Extensive chart review completed prior to meeting patient including labs, vital signs, imaging, progress notes, orders, and available advanced directive documents from current and previous encounters.   After reviewing the patient's chart and assessing the patient at bedside, I counseled with attending Dr. Ophelia Charter.  Plan is for further imaging and testing.  Discussed pain regimen.  Patient has refused some  medications such as gabapentin that would be helpful to better control her nerve pain.  I continue to advise use of gabapentin as well as Norco prior to movement.  If current regimen does not adequately control her pain and imaging does not reveal any new acute issues, would recommend an IR consult to discuss nerve block.  PMT remains available patient and family throughout her hospitalization.  I will follow-up with patient upon my return next Tuesday.  Should acute palliative needs arise before that time please utilize MA on for on-call providers.  Physical Exam Vitals reviewed.  Constitutional:      General: She is not in acute distress.    Appearance: She is normal weight.  HENT:     Head: Normocephalic.     Nose: Nose normal.     Mouth/Throat:     Mouth: Mucous membranes are moist.  Pulmonary:     Effort: Pulmonary effort is normal.  Skin:    General: Skin is warm and dry.  Psychiatric:        Behavior: Behavior normal.             Total Time 25 minutes   Time spent includes: Detailed review of medical records (labs, imaging, vital signs), medically appropriate exam (mental status, respiratory, cardiac, skin), discussed with treatment team, counseling and educating patient, family and staff, documenting clinical information, medication management and coordination of care.  [name], DNP, FNP-BC Palliative Medicine Team

## 2023-08-20 NOTE — Progress Notes (Signed)
Progress Note   Patient: Jocelyn Hooper [id number] DOB: June 14, 1941 DOA: 08/08/2023     12 DOS: the patient was seen and examined on 08/20/2023   Brief hospital course: 82yo with h/o IDA/B12 deficiency scoliosis s/p intervention 06/09/2023 who presented on 12/3 with AMS.  She was septic with MRSA bacteremia related to discitis/osteomyelitis at L4-5.  TEE negative.  On Daptomycin and Ceftaroline.  Assessment and Plan:  Sepsis due to MRSA bacteremia/L4-5 osteomyelitis/discitis Sepsis POA as evidenced by fevers, tachycardia, tachypnea, leukocytosis Blood cultures +MRSA in all 4 bottles Patient presented with complaints of severe left hip pain and is status post recent L5-S1 decompression, instrumentation and fusion on 06/09/2023 Initial MRI of the lumbar spine negative but repeat with discitis/osteomyelitis at L4-L5 TEE negative for endocarditis on 11/28 ID is following IV Vancomycin -> IV Daptomycin and ceftaroline Initial repeat Bcx from 11/26 no growth & final Repeat blood cultures 11/29 again +MRSA  Repeat blood cultures again on 12/2 NTD x 3 days Plan for PICC placement soon Based on persistent pain and minimal ability to participate in therapy, will repeat lumbosacral MRI   Inadequate Pain Control  Pt becomes too confused with oxycodone and Norco has been ineffective   Discussed options with pt's daughter - will trial lowest dose of long-acting morphine and monitor (pt has nausea with codeine on allergy profile) Started MS Contin 15 mg BID -- seems tolerating well, continue Palliative care consulted for assistance Increased gabapentin to 300 TID (was just on at bedtime) Monitor for oversedation or confusion Goal of adequate pain control for mobility, pt was ambulating with walker just prior to admission Consider addition of Cymbalta as an outpatient   Acute urinary retention Significant bladder distention noted on CT imaging with patient's inability to urinate afterwards    In-N-Out catheter with 600 cc Unclear etiology, possibly related to spinal infection Foley catheter placed 11/26 Hold oxybutynin for now Added tamsulosin  Failed voiding trial, foley replaced She will need outpatient urology f/u in 2-3 weeks   Acute metabolic encephalopathy Secondary to sepsis Improved back to baseline s Delirium precautions   ASCVD (arteriosclerotic cardiovascular disease) Continue home aspirin and metoprolol   Ambulatory dysfunction Infection has led to significant weakness She was unable to walk with PT due to tachycardia previously, likely related to insufficient pain control Continue to try to ambulate with PT She is likely to benefit from SNF rehab Of note, her husband is also down with a bad back and cannot help with care for her at home   She is quite opposed to rehab but also is very unlikely to be successful at home - needs ongoing conversations with patient and family       Consultants: ID Cardiology Palliative care PT OT [facility] team   Procedures: TEE 11/27 Echocardiogram 11/28   Antibiotics: Vancomycin Daptomycin 12/2- Ceftaroline 12/2-   30 Day Unplanned Readmission Risk Score    Flowsheet Row ED to Hosp-Admission (Current) from 08/08/2023 in [facility] PCU  30 Day Unplanned Readmission Risk Score (%) 17.19 Filed at 08/20/2023 0801       This score is the patient's risk of an unplanned readmission within 30 days of being discharged (0 -100%). The score is based on dignosis, age, lab data, medications, orders, and past utilization.   Low:  0-14.9   Medium: 15-21.9   High: 22-29.9   Extreme: 30 and above           Subjective: Continues to have difficulty participating in  therapy, has only been able to sit up at the edge of the bed.  No fevers.   Objective: Vitals:   08/20/23 1255 08/20/23 1558  BP: 108/61 119/60  Pulse: 72 82  Resp: 17 18  Temp: 97.9 F (36.6 C) 98.2 F (36.8 C)  SpO2: 99% 99%     Intake/Output Summary (Last 24 hours) at 08/20/2023 1709 Last data filed at 08/20/2023 1641 Gross per 24 hour  Intake 830.58 ml  Output 1650 ml  Net -819.42 ml   Filed Weights   08/08/23 1050 08/09/23 0703 08/11/23 0722  Weight: 58.2 kg 56 kg 56 kg    Exam:  General:  Appears calm and comfortable and is in NAD Eyes:   EOMI, normal lids, iris ENT:  grossly normal hearing, lips & tongue, mmm Neck:  no LAD, masses or thyromegaly Cardiovascular:  RRR, no m/r/g. No LE edema.  Respiratory:   CTA bilaterally with no wheezes/rales/rhonchi.  Normal respiratory effort. Abdomen:  soft, NT, ND Skin:  no rash or induration seen on limited exam Musculoskeletal:  grossly normal tone BUE/BLE, no bony abnormality Psychiatric:  blunted mood and affect, speech fluent and appropriate, AOx3 Neurologic:  CN 2-12 grossly intact, moves all extremities in coordinated fashion  Data Reviewed: I have reviewed the patient's lab results since admission.  Pertinent labs for today include:   Na++ 133 Glucose 108 WBC 15.6 Hgb 9.2 Platelets 551 ESR 128     Family Communication: Daughter was present throughout evaluation  Disposition: Status is: Inpatient Remains inpatient appropriate because: ongoing evaluation and management     Time spent: 50 minutes  Unresulted Labs (From admission, onward)     Start     Ordered   08/23/23 0500  CK  Weekly,   TIMED     Question:  Specimen collection method  Answer:  Lab=Lab collect   08/20/23 1028   08/21/23 0500  CBC with Differential/Platelet  Tomorrow morning,   R       Question:  Specimen collection method  Answer:  Lab=Lab collect   08/20/23 1709   08/21/23 0500  Basic metabolic panel  Tomorrow morning,   R       Question:  Specimen collection method  Answer:  Lab=Lab collect   08/20/23 1709             Author: Jonah Blue, MD 08/20/2023 5:09 PM  For on call review [email or web address].

## 2023-08-21 DIAGNOSIS — A4101 Sepsis due to Methicillin susceptible Staphylococcus aureus: Secondary | ICD-10-CM | POA: Diagnosis not present

## 2023-08-21 DIAGNOSIS — G9341 Metabolic encephalopathy: Secondary | ICD-10-CM | POA: Diagnosis not present

## 2023-08-21 DIAGNOSIS — R6520 Severe sepsis without septic shock: Secondary | ICD-10-CM | POA: Diagnosis not present

## 2023-08-21 LAB — BASIC METABOLIC PANEL
Anion gap: 7 (ref 5–15)
BUN: 15 mg/dL (ref 8–23)
CO2: 20 mmol/L — ABNORMAL LOW (ref 22–32)
Calcium: 8.5 mg/dL — ABNORMAL LOW (ref 8.9–10.3)
Chloride: 105 mmol/L (ref 98–111)
Creatinine, Ser: 0.73 mg/dL (ref 0.44–1.00)
GFR, Estimated: >60 mL/min (ref >60)
Glucose, Bld: 92 mg/dL (ref 70–99)
Potassium: 3.8 mmol/L (ref 3.5–5.1)
Sodium: 132 mmol/L — ABNORMAL LOW (ref 135–145)

## 2023-08-21 LAB — CBC WITH DIFFERENTIAL/PLATELET
Abs Immature Granulocytes: 0.13 10*3/uL — ABNORMAL HIGH (ref 0.00–0.07)
Basophils Absolute: 0.1 10*3/uL (ref 0.0–0.1)
Basophils Relative: 0 %
Eosinophils Absolute: 0.3 10*3/uL (ref 0.0–0.5)
Eosinophils Relative: 2 %
HCT: 26.9 % — ABNORMAL LOW (ref 36.0–46.0)
Hemoglobin: 8.7 g/dL — ABNORMAL LOW (ref 12.0–15.0)
Immature Granulocytes: 1 %
Lymphocytes Relative: 9 %
Lymphs Abs: 1.2 10*3/uL (ref 0.7–4.0)
MCH: 30.9 pg (ref 26.0–34.0)
MCHC: 32.3 g/dL (ref 30.0–36.0)
MCV: 95.4 fL (ref 80.0–100.0)
Monocytes Absolute: 0.7 10*3/uL (ref 0.1–1.0)
Monocytes Relative: 5 %
Neutro Abs: 11.2 10*3/uL — ABNORMAL HIGH (ref 1.7–7.7)
Neutrophils Relative %: 83 %
Platelets: 582 10*3/uL — ABNORMAL HIGH (ref 150–400)
RBC: 2.82 MIL/uL — ABNORMAL LOW (ref 3.87–5.11)
RDW: 14.2 % (ref 11.5–15.5)
WBC: 13.5 10*3/uL — ABNORMAL HIGH (ref 4.0–10.5)
nRBC: 0 % (ref 0.0–0.2)

## 2023-08-21 LAB — CULTURE, BLOOD (ROUTINE X 2)
Culture: NO GROWTH
Culture: NO GROWTH
Special Requests: ADEQUATE
Special Requests: ADEQUATE

## 2023-08-21 MED ORDER — SODIUM CHLORIDE 0.9 % IV SOLN
9.0000 mg/kg | Freq: Every day | INTRAVENOUS | Status: DC
  Filled 2023-08-21: qty 10

## 2023-08-21 MED ORDER — DAPTOMYCIN-SODIUM CHLORIDE 500-0.9 MG/50ML-% IV SOLN
9.0000 mg/kg | Freq: Every day | INTRAVENOUS | Status: DC
  Administered 2023-08-21 – 2023-08-30 (×10): 500 mg via INTRAVENOUS
  Filled 2023-08-21 (×10): qty 50

## 2023-08-21 NOTE — Progress Notes (Signed)
Progress Note   Patient: Jocelyn Hooper WGN:[id number] DOB: 05-Jan-1941 DOA: 08/08/2023     13 DOS: the patient was seen and examined on 08/21/2023   Brief hospital course: 82yo with h/o IDA/B12 deficiency scoliosis s/p intervention 06/09/2023 who presented on 12/3 with AMS.  She was septic with MRSA bacteremia related to discitis/osteomyelitis at L4-5.  TEE negative.  On Daptomycin and Ceftaroline.  Assessment and Plan:  Sepsis due to MRSA bacteremia/L4-5 osteomyelitis/discitis Sepsis POA as evidenced by fevers, tachycardia, tachypnea, leukocytosis Blood cultures +MRSA in all 4 bottles Patient presented with complaints of severe left hip pain and is status post recent L5-S1 decompression, instrumentation and fusion on 06/09/2023 Initial MRI of the lumbar spine negative but repeat with discitis/osteomyelitis at L4-L5 TEE negative for endocarditis on 11/28 ID is following IV Vancomycin -> IV Daptomycin and ceftaroline Initial repeat Bcx from 11/26 no growth & final Repeat blood cultures 11/29 again +MRSA  Repeat blood cultures again on 12/2 NTD x 3 days Plan for PICC placement soon Based on persistent pain and minimal ability to participate in therapy, repeated lumbosacral MRI on 12/6 with no significant change from prior   Inadequate Pain Control  Started MS Contin 15 mg BID -- seems tolerating well, continue Palliative care consulted for assistance Increased gabapentin to 300 TID (was just on at bedtime) Goal of adequate pain control for mobility, pt was ambulating with walker just prior to admission Consider addition of Cymbalta as an outpatient   Acute urinary retention Significant bladder distention noted on CT imaging with patient's inability to urinate afterwards   In-N-Out catheter with 600 cc Unclear etiology, possibly related to spinal infection Foley catheter placed 11/26 Added tamsulosin  Failed voiding trial, foley replaced 12/6 She will need outpatient urology f/u in 2-3  weeks    ASCVD (arteriosclerotic cardiovascular disease) Continue home aspirin and metoprolol   Ambulatory dysfunction Infection has led to significant weakness She is likely to benefit from SNF rehab Of note, her husband is also down with a bad back and cannot help with care for her at home   She is quite opposed to rehab but appears to be reluctantly agreeable       Consultants: ID Cardiology Palliative care PT OT [facility] team   Procedures: TEE 11/27 Echocardiogram 11/28   Antibiotics: Vancomycin Daptomycin 12/2-1/13/25 Ceftaroline 12/2-12/11  30 Day Unplanned Readmission Risk Score    Flowsheet Row ED to Hosp-Admission (Current) from 08/08/2023 in [facility] PCU  30 Day Unplanned Readmission Risk Score (%) 17.44 Filed at 08/21/2023 0800       This score is the patient's risk of an unplanned readmission within 30 days of being discharged (0 -100%). The score is based on dignosis, age, lab data, medications, orders, and past utilization.   Low:  0-14.9   Medium: 15-21.9   High: 22-29.9   Extreme: 30 and above           Subjective: No specific complaints today.  Eating some but food is tasteless.  She is willing to try Remeron.  She is considering placement.   Objective: Vitals:   08/21/23 0754 08/21/23 1310  BP: (!) 107/53 118/67  Pulse: 80 84  Resp: 15 15  Temp: 98.2 F (36.8 C) 98.1 F (36.7 C)  SpO2: 97% 98%    Intake/Output Summary (Last 24 hours) at 08/21/2023 1521 Last data filed at 08/21/2023 0950 Gross per 24 hour  Intake 51.39 ml  Output 1275 ml  Net -1223.61 ml  Filed Weights   08/08/23 1050 08/09/23 0703 08/11/23 0722  Weight: 58.2 kg 56 kg 56 kg    Exam:  General:  Appears calm and comfortable and is in NAD Eyes:   EOMI, normal lids, iris ENT:  grossly normal hearing, lips & tongue, mmm Neck:  no LAD, masses or thyromegaly Cardiovascular:  RRR, no m/r/g. No LE edema.  Respiratory:   CTA bilaterally with no  wheezes/rales/rhonchi.  Normal respiratory effort. Abdomen:  soft, NT, ND Skin:  no rash or induration seen on limited exam Musculoskeletal:  grossly normal tone BUE/BLE, no bony abnormality Psychiatric:  blunted mood and affect, speech fluent and appropriate, AOx3 Neurologic:  CN 2-12 grossly intact, moves all extremities in coordinated fashion  Data Reviewed: I have reviewed the patient's lab results since admission.  Pertinent labs for today include:   Na++ 132 - stable WBC 13.5 Hgb 8.7 Platelets 582     Family Communication: DIL and friend were present throughout evaluation  Disposition: Status is: Inpatient Remains inpatient appropriate because: ongoing treatment Possible placement early next week     Time spent: 50 minutes  Unresulted Labs (From admission, onward)     Start     Ordered   08/23/23 0500  CK  Weekly,   TIMED     Question:  Specimen collection method  Answer:  Lab=Lab collect   08/20/23 1028   Unscheduled  CBC with Differential/Platelet  Tomorrow morning,   R       Question:  Specimen collection method  Answer:  Lab=Lab collect   08/21/23 1521   Unscheduled  Basic metabolic panel  Tomorrow morning,   R       Question:  Specimen collection method  Answer:  Lab=Lab collect   08/21/23 1521             Author: Jonah Blue, MD 08/21/2023 3:21 PM  For on call review [email or web address].

## 2023-08-21 NOTE — Plan of Care (Signed)

## 2023-08-22 ENCOUNTER — Other Ambulatory Visit: Payer: Self-pay

## 2023-08-22 DIAGNOSIS — R6520 Severe sepsis without septic shock: Secondary | ICD-10-CM | POA: Diagnosis not present

## 2023-08-22 DIAGNOSIS — G9341 Metabolic encephalopathy: Secondary | ICD-10-CM | POA: Diagnosis not present

## 2023-08-22 DIAGNOSIS — A4101 Sepsis due to Methicillin susceptible Staphylococcus aureus: Secondary | ICD-10-CM | POA: Diagnosis not present

## 2023-08-22 LAB — CBC WITH DIFFERENTIAL/PLATELET
Abs Immature Granulocytes: 0.12 10*3/uL — ABNORMAL HIGH (ref 0.00–0.07)
Basophils Absolute: 0.1 10*3/uL (ref 0.0–0.1)
Basophils Relative: 0 %
Eosinophils Absolute: 0.3 10*3/uL (ref 0.0–0.5)
Eosinophils Relative: 2 %
HCT: 25.8 % — ABNORMAL LOW (ref 36.0–46.0)
Hemoglobin: 8.5 g/dL — ABNORMAL LOW (ref 12.0–15.0)
Immature Granulocytes: 1 %
Lymphocytes Relative: 9 %
Lymphs Abs: 1.1 10*3/uL (ref 0.7–4.0)
MCH: 30.7 pg (ref 26.0–34.0)
MCHC: 32.9 g/dL (ref 30.0–36.0)
MCV: 93.1 fL (ref 80.0–100.0)
Monocytes Absolute: 0.7 10*3/uL (ref 0.1–1.0)
Monocytes Relative: 6 %
Neutro Abs: 9.9 10*3/uL — ABNORMAL HIGH (ref 1.7–7.7)
Neutrophils Relative %: 82 %
Platelets: 591 10*3/uL — ABNORMAL HIGH (ref 150–400)
RBC: 2.77 MIL/uL — ABNORMAL LOW (ref 3.87–5.11)
RDW: 14.1 % (ref 11.5–15.5)
WBC: 12.1 10*3/uL — ABNORMAL HIGH (ref 4.0–10.5)
nRBC: 0 % (ref 0.0–0.2)

## 2023-08-22 LAB — BASIC METABOLIC PANEL
Anion gap: 8 (ref 5–15)
BUN: 18 mg/dL (ref 8–23)
CO2: 21 mmol/L — ABNORMAL LOW (ref 22–32)
Calcium: 8.3 mg/dL — ABNORMAL LOW (ref 8.9–10.3)
Chloride: 101 mmol/L (ref 98–111)
Creatinine, Ser: 0.82 mg/dL (ref 0.44–1.00)
GFR, Estimated: >60 mL/min (ref >60)
Glucose, Bld: 104 mg/dL — ABNORMAL HIGH (ref 70–99)
Potassium: 3.8 mmol/L (ref 3.5–5.1)
Sodium: 130 mmol/L — ABNORMAL LOW (ref 135–145)

## 2023-08-22 LAB — GLUCOSE, CAPILLARY: Glucose-Capillary: 99 mg/dL (ref 70–99)

## 2023-08-22 MED ORDER — MIRTAZAPINE 15 MG PO TABS
7.5000 mg | ORAL_TABLET | Freq: Every day | ORAL | Status: DC
  Administered 2023-08-22: 7.5 mg via ORAL
  Filled 2023-08-22: qty 1

## 2023-08-22 MED ORDER — SODIUM CHLORIDE 0.9% FLUSH
10.0000 mL | INTRAVENOUS | Status: DC | PRN
Start: 2023-08-22 — End: 2023-08-30

## 2023-08-22 MED ORDER — SODIUM CHLORIDE 0.9% FLUSH
10.0000 mL | Freq: Two times a day (BID) | INTRAVENOUS | Status: DC
Start: 2023-08-22 — End: 2023-08-30
  Administered 2023-08-22 – 2023-08-30 (×15): 10 mL

## 2023-08-22 NOTE — Progress Notes (Signed)
Progress Note   Patient: Jocelyn Hooper EXB:[id number] DOB: Apr 05, 1941 DOA: 08/08/2023     14 DOS: the patient was seen and examined on 08/22/2023   Brief hospital course: 82yo with h/o IDA/B12 deficiency scoliosis s/p intervention 06/09/2023 who presented on 12/3 with AMS.  She was septic with MRSA bacteremia related to discitis/osteomyelitis at L4-5.  TEE negative.  On Daptomycin and Ceftaroline.  Assessment and Plan:  Sepsis due to MRSA bacteremia/L4-5 osteomyelitis/discitis Sepsis POA as evidenced by fevers, tachycardia, tachypnea, leukocytosis Blood cultures +MRSA in all 4 bottles Patient presented with complaints of severe left hip pain and is status post recent L5-S1 decompression, instrumentation and fusion on 06/09/2023 Initial MRI of the lumbar spine negative but repeat with discitis/osteomyelitis at L4-L5 TEE negative for endocarditis on 11/28 ID is following IV Vancomycin -> IV Daptomycin and ceftaroline Initial repeat Bcx from 11/26 no growth & final Repeat blood cultures 11/29 again +MRSA  Repeat blood cultures again on 12/2 negative x 5 days Plan for PICC placement tomorrow Based on persistent pain and minimal ability to participate in therapy, repeated lumbosacral MRI on 12/6 with no significant change from prior   Inadequate Pain Control  Started MS Contin 15 mg BID -- seems tolerating well, continue Palliative care consulted for assistance Increased gabapentin to 300 TID (was just on at bedtime) Goal of adequate pain control for mobility, pt was ambulating with walker just prior to admission Consider addition of Cymbalta as an outpatient   Acute urinary retention Significant bladder distention noted on CT imaging with patient's inability to urinate afterwards   In-N-Out catheter with 600 cc Unclear etiology, possibly related to spinal infection Foley catheter placed 11/26 Added tamsulosin  Failed voiding trial, foley replaced 12/6 She will need outpatient urology  f/u in 2-3 weeks   ASCVD (arteriosclerotic cardiovascular disease) Continue home aspirin and metoprolol  Hyponatremia Appears to be chronic, stable Will monitor periodically  Thrombocytosis Likely reactive, although it is continuing to slowly worsen while leukocytosis improves Will continue to monitor  Ambulatory dysfunction Infection has led to significant weakness She is likely to benefit from SNF rehab Of note, her husband is also down with a bad back and cannot help with care for her at home   She is quite opposed to rehab but appears to be reluctantly agreeable Will add mirtazepine for appetite stimulation + mood enhancement (d/w patient and family) She has not had any therapy since 12/5        Consultants: ID Cardiology Palliative care PT OT [facility] team   Procedures: TEE 11/27 Echocardiogram 11/28   Antibiotics: Vancomycin Daptomycin 12/2-1/13/25 Ceftaroline 12/2-12/11   30 Day Unplanned Readmission Risk Score    Flowsheet Row ED to Hosp-Admission (Current) from 08/08/2023 in [facility] PCU  30 Day Unplanned Readmission Risk Score (%) 17.69 Filed at 08/22/2023 0801       This score is the patient's risk of an unplanned readmission within 30 days of being discharged (0 -100%). The score is based on dignosis, age, lab data, medications, orders, and past utilization.   Low:  0-14.9   Medium: 15-21.9   High: 22-29.9   Extreme: 30 and above           Subjective: Feeling ok.  Has not been out of bed since 12/5 and is hoping to have therapy today.  Open to Remeron for appetite/mood.   Objective: Vitals:   08/22/23 0951 08/22/23 1256  BP: (!) 102/57 (!) 103/49  Pulse: 80 75  Resp: 18 18  Temp: 98.7 F (37.1 C) 98.4 F (36.9 C)  SpO2: 97% 100%    Intake/Output Summary (Last 24 hours) at 08/22/2023 1510 Last data filed at 08/22/2023 0449 Gross per 24 hour  Intake 150 ml  Output 1150 ml  Net -1000 ml   Filed Weights   08/08/23  1050 08/09/23 0703 08/11/23 0722  Weight: 58.2 kg 56 kg 56 kg    Exam:  General:  Appears calm and comfortable and is in NAD Eyes:   EOMI, normal lids, iris ENT:  grossly normal hearing, lips & tongue, mmm Neck:  no LAD, masses or thyromegaly Cardiovascular:  RRR, no m/r/g. No LE edema.  Respiratory:   CTA bilaterally with no wheezes/rales/rhonchi.  Normal respiratory effort. Abdomen:  soft, NT, ND Skin:  no rash or induration seen on limited exam Musculoskeletal:  grossly normal tone BUE/BLE, no bony abnormality Psychiatric:  blunted mood and affect, speech fluent and appropriate, AOx3 Neurologic:  CN 2-12 grossly intact, moves all extremities in coordinated fashion  Data Reviewed: I have reviewed the patient's lab results since admission.  Pertinent labs for today include:   Na++ 130 Glucose 104 WBC 12.1, improved Hgb 8.5 Platelets 591     Family Communication: Family members were present; I also spoke with her daughter by telephone  Disposition: Status is: Inpatient Remains inpatient appropriate because: ongoing evaluation and management     Time spent: 50 minutes  Unresulted Labs (From admission, onward)     Start     Ordered   08/23/23 0500  CK  Weekly,   TIMED     Question:  Specimen collection method  Answer:  Lab=Lab collect   08/20/23 1028   08/23/23 0500  CBC with Differential/Platelet  Tomorrow morning,   R       Question:  Specimen collection method  Answer:  Lab=Lab collect   08/22/23 1452   08/23/23 0500  Basic metabolic panel  Tomorrow morning,   R       Question:  Specimen collection method  Answer:  Lab=Lab collect   08/22/23 1452             Author: Jonah Blue, MD 08/22/2023 3:10 PM  For on call review [email or web address].

## 2023-08-22 NOTE — Progress Notes (Signed)
Physical Therapy Treatment Patient Details Name: Jocelyn Hooper MRN: [id number] DOB: [birth year] Today's Date: 08/22/2023   History of Present Illness Pt is an 82 yo F presenting due to AMS, with MRSA, sepsis, acute urinary retention, acute metabolic encephalopathy, arteriosclerotic CVD, and hypokalemia. PMH significant for L5-S1 TLIF (06/09/2023), anemia, L shoulder bursitis, CAD, CSF leak, IBS, Scoliosis, Trigeminal neuralgia, and B TKR.    PT Comments  Pt in room ready for session.  She has difficulty initiating transition to sitting EOB and needs mod/max a x 1 to get to EOB. She does not get fully upright at any time during session with heavy R lean to about 45 degrees.  She remains propped on R elbow and does not shift weight to L hip at any time during session.  HOB is raised and lowered to assist but does not change positioning.  She does remain sitting for 10 minutes and is able to complete seated ex.  While she is motivated for session and wants to keep sitting, sitting balance and posture limits her ability to stand and will need +2 for any attempts at this time.  She is assisted back to bed with max a x 1 to lift LE's and continues with supine AAROM.  Pt c/o left hip pain but does not grimace with mobility or ex.  Pt does seem a bit confused during session.  She is scrolling on her iphone upon arrival but when I gave her phone back to her before leaving "I have never used one of these before".  Discussed with RN.   If plan is discharge home, recommend the following: Assistance with cooking/housework;Help with stairs or ramp for entrance;Supervision due to cognitive status;Assist for transportation;Direct supervision/assist for financial management;Two people to help with walking and/or transfers;Two people to help with bathing/dressing/bathroom   Can travel by private vehicle        Equipment Recommendations       Recommendations for Other Services       Precautions / Restrictions  Precautions Precautions: Fall Restrictions Weight Bearing Restrictions: No     Mobility  Bed Mobility Overal bed mobility: Needs Assistance Bed Mobility: Supine to Sit     Supine to sit: Mod assist, Max assist       Patient Response: Cooperative  Transfers                   General transfer comment: not attempted    Ambulation/Gait               General Gait Details: unsafe to attempt   Stairs             Wheelchair Mobility     Tilt Bed Tilt Bed Patient Response: Cooperative  Modified Rankin (Stroke Patients Only)       Balance Overall balance assessment: Needs assistance Sitting-balance support: Feet supported, Bilateral upper extremity supported Sitting balance-Leahy Scale: Zero Sitting balance - Comments: does not sit fully upright desite increased time and cues.  remains about 45 degrees leaning to right despie HOB raised and lowered. Postural control: Posterior lean, Right lateral lean                                  Cognition Arousal: Alert Behavior During Therapy: Flat affect Overall Cognitive Status: Impaired/Different from baseline  Exercises Other Exercises Other Exercises: seated AROM x 10 BE and supine AAROM x 10 with cues to conitnue and complete rep count    General Comments        Pertinent Vitals/Pain Pain Assessment Pain Assessment: Faces Faces Pain Scale: Hurts whole lot Pain Location: L hip.  would not WB in sitting on hip leaning heavily to right side Pain Descriptors / Indicators: Grimacing, Discomfort, Guarding Pain Intervention(s): Monitored during session, Limited activity within patient's tolerance, Repositioned    Home Living                          Prior Function            PT Goals (current goals can now be found in the care plan section) Progress towards PT goals: Not progressing toward goals - comment     Frequency    Min 1X/week      PT Plan      Co-evaluation              AM-PAC PT "6 Clicks" Mobility   Outcome Measure  Help needed turning from your back to your side while in a flat bed without using bedrails?: A Lot Help needed moving from lying on your back to sitting on the side of a flat bed without using bedrails?: Total Help needed moving to and from a bed to a chair (including a wheelchair)?: Total Help needed standing up from a chair using your arms (e.g., wheelchair or bedside chair)?: Total Help needed to walk in hospital room?: Total Help needed climbing 3-5 steps with a railing? : Total 6 Click Score: 7    End of Session   Activity Tolerance: Patient tolerated treatment well;Patient limited by pain Patient left: in bed;with call bell/phone within reach;with bed alarm set Nurse Communication: Mobility status;Other (comment) PT Visit Diagnosis: Difficulty in walking, not elsewhere classified (R26.2);Muscle weakness (generalized) (M62.81);Pain Pain - Right/Left: Left Pain - part of body: Hip     Time: 1350-1409 PT Time Calculation (min) (ACUTE ONLY): 19 min  Charges:    $Therapeutic Exercise: 8-22 mins PT General Charges $$ ACUTE PT VISIT: 1 Visit                   Danielle Dess, PTA 08/22/23, 2:20 PM

## 2023-08-22 NOTE — Progress Notes (Signed)
Peripherally Inserted Central Catheter Placement  The IV Nurse has discussed with the patient and/or persons authorized to consent for the patient, the purpose of this procedure and the potential benefits and risks involved with this procedure.  The benefits include less needle sticks, lab draws from the catheter, and the patient may be discharged home with the catheter. Risks include, but not limited to, infection, bleeding, blood clot (thrombus formation), and puncture of an artery; nerve damage and irregular heartbeat and possibility to perform a PICC exchange if needed/ordered by physician.  Alternatives to this procedure were also discussed.  Bard Power PICC patient education guide, fact sheet on infection prevention and patient information card has been provided to patient /or left at bedside.  Telephone consent obtained from dtr. Dtr at bedside upon arrival.   PICC Placement Documentation  PICC Single Lumen 08/22/23 Right Basilic 33 cm 0 cm (Active)  Indication for Insertion or Continuance of Line Home intravenous therapies (PICC only) 08/22/23 1848  Exposed Catheter (cm) 0 cm 08/22/23 1848  Site Assessment Clean, Dry, Intact 08/22/23 1848  Line Status Saline locked;Flushed;Blood return noted 08/22/23 1848  Dressing Type Transparent;Securing device 08/22/23 1848  Dressing Status Antimicrobial disc in place;Clean, Dry, Intact 08/22/23 1848  Line Care Connections checked and tightened 08/22/23 1848  Line Adjustment (NICU/IV Team Only) No 08/22/23 1848  Dressing Intervention New dressing;Adhesive placed at insertion site (IV team only);Adhesive placed around edges of dressing (IV team/ICU RN only) 08/22/23 1848  Dressing Change Due 08/29/23 08/22/23 1848       Jocelyn Hooper 08/22/2023, 6:49 PM

## 2023-08-22 NOTE — TOC Progression Note (Signed)
Transition of Care [facility]) - Progression Note    Patient Details  Name: Jocelyn Hooper MRN: [id number] Date of Birth: 1941-01-27  Transition of Care [facility]) CM/SW Contact  Bing Quarry, RN Phone Number: 08/22/2023, 4:14 PM  Clinical Narrative: 12/8: Family requested a list of inpatient rehab facilities that would accept HTA insurance. Attempted to reach out on weekend on call number to see if there was such a list but was unable to connect. TOC to follow up with family/list if available Monday.   Gabriel Cirri MSN RN CM  Care Management Department.  [place]  [facility] Direct Dial: [phone] Main Office Phone: [phone] Weekends Only        Expected Discharge Plan: Home w Home Health Services Barriers to Discharge: Continued Medical Work up  Expected Discharge Plan and Services                                               Social Determinants of Health (SDOH) Interventions SDOH Screenings   Food Insecurity: No Food Insecurity (08/09/2023)  Housing: Low Risk  (08/09/2023)  Transportation Needs: No Transportation Needs (08/09/2023)  Utilities: Not At Risk (08/09/2023)  Financial Resource Strain: Low Risk  (07/01/2023)   Received from [facility]  Tobacco Use: Low Risk  (08/08/2023)    Readmission Risk Interventions     No data to display

## 2023-08-23 ENCOUNTER — Inpatient Hospital Stay: Payer: PPO

## 2023-08-23 DIAGNOSIS — G253 Myoclonus: Secondary | ICD-10-CM | POA: Diagnosis not present

## 2023-08-23 DIAGNOSIS — A4101 Sepsis due to Methicillin susceptible Staphylococcus aureus: Secondary | ICD-10-CM | POA: Diagnosis not present

## 2023-08-23 DIAGNOSIS — M4646 Discitis, unspecified, lumbar region: Secondary | ICD-10-CM | POA: Diagnosis not present

## 2023-08-23 DIAGNOSIS — B9562 Methicillin resistant Staphylococcus aureus infection as the cause of diseases classified elsewhere: Secondary | ICD-10-CM | POA: Diagnosis not present

## 2023-08-23 DIAGNOSIS — M4626 Osteomyelitis of vertebra, lumbar region: Secondary | ICD-10-CM | POA: Diagnosis not present

## 2023-08-23 DIAGNOSIS — G9340 Encephalopathy, unspecified: Secondary | ICD-10-CM | POA: Diagnosis not present

## 2023-08-23 DIAGNOSIS — R7881 Bacteremia: Secondary | ICD-10-CM | POA: Diagnosis not present

## 2023-08-23 DIAGNOSIS — A419 Sepsis, unspecified organism: Secondary | ICD-10-CM | POA: Diagnosis not present

## 2023-08-23 DIAGNOSIS — G9341 Metabolic encephalopathy: Secondary | ICD-10-CM | POA: Diagnosis not present

## 2023-08-23 DIAGNOSIS — R6520 Severe sepsis without septic shock: Secondary | ICD-10-CM | POA: Diagnosis not present

## 2023-08-23 DIAGNOSIS — I6782 Cerebral ischemia: Secondary | ICD-10-CM | POA: Diagnosis not present

## 2023-08-23 LAB — HEPATIC FUNCTION PANEL
ALT: 23 U/L (ref 0–44)
AST: 19 U/L (ref 15–41)
Albumin: 2 g/dL — ABNORMAL LOW (ref 3.5–5.0)
Alkaline Phosphatase: 255 U/L — ABNORMAL HIGH (ref 38–126)
Bilirubin, Direct: 0.1 mg/dL (ref 0.0–0.2)
Indirect Bilirubin: 0.2 mg/dL — ABNORMAL LOW (ref 0.3–0.9)
Total Bilirubin: 0.3 mg/dL (ref <1.2)
Total Protein: 5.5 g/dL — ABNORMAL LOW (ref 6.5–8.1)

## 2023-08-23 LAB — BASIC METABOLIC PANEL
Anion gap: 8 (ref 5–15)
BUN: 18 mg/dL (ref 8–23)
CO2: 18 mmol/L — ABNORMAL LOW (ref 22–32)
Calcium: 8.5 mg/dL — ABNORMAL LOW (ref 8.9–10.3)
Chloride: 108 mmol/L (ref 98–111)
Creatinine, Ser: 0.75 mg/dL (ref 0.44–1.00)
GFR, Estimated: >60 mL/min (ref >60)
Glucose, Bld: 98 mg/dL (ref 70–99)
Potassium: 3.8 mmol/L (ref 3.5–5.1)
Sodium: 134 mmol/L — ABNORMAL LOW (ref 135–145)

## 2023-08-23 LAB — CBC WITH DIFFERENTIAL/PLATELET
Abs Immature Granulocytes: 0.11 10*3/uL — ABNORMAL HIGH (ref 0.00–0.07)
Basophils Absolute: 0.1 10*3/uL (ref 0.0–0.1)
Basophils Relative: 1 %
Eosinophils Absolute: 0.3 10*3/uL (ref 0.0–0.5)
Eosinophils Relative: 3 %
HCT: 25.7 % — ABNORMAL LOW (ref 36.0–46.0)
Hemoglobin: 8.5 g/dL — ABNORMAL LOW (ref 12.0–15.0)
Immature Granulocytes: 1 %
Lymphocytes Relative: 11 %
Lymphs Abs: 1.1 10*3/uL (ref 0.7–4.0)
MCH: 31.1 pg (ref 26.0–34.0)
MCHC: 33.1 g/dL (ref 30.0–36.0)
MCV: 94.1 fL (ref 80.0–100.0)
Monocytes Absolute: 0.7 10*3/uL (ref 0.1–1.0)
Monocytes Relative: 7 %
Neutro Abs: 7.1 10*3/uL (ref 1.7–7.7)
Neutrophils Relative %: 77 %
Platelets: 587 10*3/uL — ABNORMAL HIGH (ref 150–400)
RBC: 2.73 MIL/uL — ABNORMAL LOW (ref 3.87–5.11)
RDW: 14.4 % (ref 11.5–15.5)
WBC: 9.3 10*3/uL (ref 4.0–10.5)
nRBC: 0 % (ref 0.0–0.2)

## 2023-08-23 LAB — CK: Total CK: 20 U/L — ABNORMAL LOW (ref 38–234)

## 2023-08-23 LAB — AMMONIA: Ammonia: 26 umol/L (ref 9–35)

## 2023-08-23 MED ORDER — LACTATED RINGERS IV BOLUS
500.0000 mL | Freq: Once | INTRAVENOUS | Status: AC
  Administered 2023-08-23: 500 mL via INTRAVENOUS

## 2023-08-23 NOTE — TOC Progression Note (Signed)
Transition of Care [facility]) - Progression Note    Patient Details  Name: Jocelyn Hooper MRN: [id number] Date of Birth: 1941/01/15  Transition of Care [facility]) CM/SW Contact  Truddie Hidden, RN Phone Number: 08/23/2023, 11:39 AM  Clinical Narrative:    Patient is alert and oriented x2. Spoke with patient's daughter, [name] regarding discharge plan. Patient does not have needed support to return home at this time. Patient initially requested HH. That was arranged as well as home infusions via [employer]. [name] is requesting SNF per recommendation from therapy. She would like [facility] and [facility] as first and second choice. She has been advised after a bed has been offered  facilities they would be able to choose form that list. [name] was advised insurance auth would be required.     Expected Discharge Plan: Home w Home Health Services Barriers to Discharge: Continued Medical Work up  Expected Discharge Plan and Services                                               Social Determinants of Health (SDOH) Interventions SDOH Screenings   Food Insecurity: No Food Insecurity (08/09/2023)  Housing: Low Risk  (08/09/2023)  Transportation Needs: No Transportation Needs (08/09/2023)  Utilities: Not At Risk (08/09/2023)  Financial Resource Strain: Low Risk  (07/01/2023)   Received from [facility]  Tobacco Use: Low Risk  (08/08/2023)    Readmission Risk Interventions     No data to display

## 2023-08-23 NOTE — Progress Notes (Signed)
Date of Admission:  08/08/2023     ID: Jocelyn Hooper is a 82 y.o. female  Principal Problem:   Sepsis with encephalopathy without septic shock (HCC) Active Problems:   Mixed hyperlipidemia   GERD (gastroesophageal reflux disease)   ASCVD (arteriosclerotic cardiovascular disease)   B12 deficiency   Trigeminal neuralgia syndrome   Spondylolisthesis of lumbosacral region   Acute urinary retention   Altered mental status   MRSA bacteremia   Discitis of lumbar region   Inadequate pain control  On 06/09/23 underwent L5-S1 decompression with fusion at [facility] B/l TKA ( Left 2013, Rt 2019) Left hip pain- says she has bursitits and gets injection  MRI hip showed bursa in rt peritrochanteric area  Subjective: Pt is in bed somnolent  Medications:   acetaminophen  650 mg Oral Q6H   Or   acetaminophen  650 mg Rectal Q6H   aspirin EC  81 mg Oral Daily   Chlorhexidine Gluconate Cloth  6 each Topical Daily   enoxaparin (LOVENOX) injection  40 mg Subcutaneous Q24H   estradiol  0.5 mg Oral Daily   feeding supplement  237 mL Oral BID BM   fluticasone  1 spray Each Nare Daily   gabapentin  300 mg Oral TID   loratadine  10 mg Oral Daily   metoprolol succinate  25 mg Oral Daily   mirtazapine  7.5 mg Oral QHS   morphine  15 mg Oral Q12H   pantoprazole  40 mg Oral Daily   polyethylene glycol  17 g Oral Daily   senna-docusate  1 tablet Oral BID   sodium chloride flush  10-40 mL Intracatheter Q12H   sodium chloride flush  3 mL Intravenous Q12H   tamsulosin  0.4 mg Oral QPC supper   topiramate  150 mg Oral QHS    Objective: Vital signs in last 24 hours: Patient Vitals for the past 24 hrs:  BP Temp Temp src Pulse Resp SpO2 Weight  08/23/23 0511 (!) 102/52 97.9 F (36.6 C) Oral 77 -- 94 % --  08/23/23 0400 -- -- -- -- -- -- 57.5 kg  08/22/23 2345 (!) 105/48 97.6 F (36.4 C) -- 80 18 99 % --  08/22/23 1959 (!) 105/57 98.3 F (36.8 C) Oral 95 16 100 % --  08/22/23 1702 (!) 100/51 98.4 F  (36.9 C) -- 73 18 97 % --  08/22/23 1256 (!) 103/49 98.4 F (36.9 C) -- 75 18 100 % --  08/22/23 0951 (!) 102/57 98.7 F (37.1 C) -- 80 18 97 % --     LDA Foley   PHYSICAL EXAM:  General:somnolent- has spontaneous twitching face and jerks of upper extremities She answers simple questions  Lungs: Clear to auscultation bilaterally. No Wheezing or Rhonchi. No rales. Heart: Regular rate and rhythm, no murmur, rub or gallop. Abdomen: Soft, non-tender,not distended. Bowel sounds normal. No masses Extremities: atraumatic, no cyanosis. No edema. No clubbing B/l knee surgical scars foley Skin: No rashes or lesions. Or bruising Lymph: Cervical, supraclavicular normal. Neurologic: as above  Lab Results    Latest Ref Rng & Units 08/23/2023    6:09 AM 08/22/2023    6:30 AM 08/21/2023    5:22 AM  CBC  WBC 4.0 - 10.5 K/uL 9.3  12.1  13.5   Hemoglobin 12.0 - 15.0 g/dL 8.5  8.5  8.7   Hematocrit 36.0 - 46.0 % 25.7  25.8  26.9   Platelets 150 - 400 K/uL 587  591  582        Latest Ref Rng & Units 08/23/2023    6:09 AM 08/22/2023    6:30 AM 08/21/2023    5:22 AM  CMP  Glucose 70 - 99 mg/dL 98  409  92   BUN 8 - 23 mg/dL 18  18  15    Creatinine 0.44 - 1.00 mg/dL 8.11  9.14  7.82   Sodium 135 - 145 mmol/L 134  130  132   Potassium 3.5 - 5.1 mmol/L 3.8  3.8  3.8   Chloride 98 - 111 mmol/L 108  101  105   CO2 22 - 32 mmol/L 18  21  20    Calcium 8.9 - 10.3 mg/dL 8.5  8.3  8.5       Microbiology: 08/08/23- 4/4 MRSA bacteremia 08/10/23-BC - ng 11/29 BC + mRSA 12/2 Bc    Assessment/Plan:  Myoclonic jerks and twitchings fac Likely from medication- on MS contin, Remeron and ceftaroline Remeron was started last night All are being discontinued  ?? Seizures  Hospitalist ordering CT  ?? Neuro consult  MRSA bacteremia  Recent spine fusion 06/09/23 underwent L5-S1 laminotomy, facectomy and posterior lumbar interbody fusion High bio-burden Now has  discitis/osteo a L4-L5  area Edematous paraspinal soft tissue at L5 Partial collapse of L5 vertebral body TEE no endocarditis Leucocytosis- fluctuates Pt on dual  dapto and ceftaroline (Dual MRSA covergae) since 12/2 because of persistent fever and bacteremia- now fever has resolved Can DC ceftaroline  and tcontinue antibiotic Daptomycin - will need 6 weeks minimum until 09/27/23  MRI brain small vessel disease Repeat MRI lumbar spine- discitis L4/L5, edematous paraspinal tissue at l5 Repeat blood culture from 11/29 positive for gram positive cocci Repeat blood culture from 12/2 so far negative    Urinary retention- has foley, failed voiding trial   Discussed the management with the daughter at bed side  Pt seen with Dr.Yates

## 2023-08-23 NOTE — Progress Notes (Signed)
Physical Therapy Treatment Patient Details Name: Jocelyn Hooper MRN: [id number] DOB: November 12, 1940 Today's Date: 08/23/2023   History of Present Illness Pt is an 82 yo F presenting due to AMS, with MRSA, sepsis, acute urinary retention, acute metabolic encephalopathy, arteriosclerotic CVD, and hypokalemia. PMH significant for L5-S1 TLIF (06/09/2023), anemia, L shoulder bursitis, CAD, CSF leak, IBS, Scoliosis, Trigeminal neuralgia, and B TKR.    PT Comments  Pt awaken on arrival, had eaten muffin.  Ready for session.  She is alert and talkative but does seem to have some confusion again this am.  Transitioned to EOB with max/dependant assist +1.  She verbalaizes being ready to stand and "lets go" despite being unable to sit.  She has increased difficulty sitting today with more right lean and HOB is raised fully with pillows used to help prop her up fully.  She seems to fall asleep while sitting and session is stopped by writer after about 5 minutes of sitting and returns to supine with dependant assist where she drifts back to sleep.       If plan is discharge home, recommend the following: Assistance with cooking/housework;Help with stairs or ramp for entrance;Supervision due to cognitive status;Assist for transportation;Direct supervision/assist for financial management;Two people to help with walking and/or transfers;Two people to help with bathing/dressing/bathroom   Can travel by private vehicle        Equipment Recommendations       Recommendations for Other Services       Precautions / Restrictions Precautions Precautions: Fall Restrictions Weight Bearing Restrictions: No     Mobility  Bed Mobility Overal bed mobility: Needs Assistance Bed Mobility: Supine to Sit, Sit to Supine     Supine to sit: Max assist, Total assist Sit to supine: Total assist   General bed mobility comments: more assist today needed    Transfers                   General transfer comment: not  attempted    Ambulation/Gait                   Stairs             Wheelchair Mobility     Tilt Bed    Modified Rankin (Stroke Patients Only)       Balance Overall balance assessment: Needs assistance Sitting-balance support: Feet supported, Bilateral upper extremity supported Sitting balance-Leahy Scale: Zero Sitting balance - Comments: increased difficulty with sitting balance.  HOB raised fully with pillow support to attempt to get uprigth but remains at about 75 degree angle for about 5 minutes Postural control: Posterior lean, Right lateral lean                                  Cognition Arousal: Alert Behavior During Therapy: Flat affect Overall Cognitive Status: Impaired/Different from baseline                                 General Comments: awake initially but seemed to fall asleep while sitting        Exercises      General Comments        Pertinent Vitals/Pain Pain Assessment Pain Assessment: Faces Faces Pain Scale: Hurts little more Pain Location: Did not c/o LLE pain today but remains with heavy lean to R side, more so today  Pain Descriptors / Indicators: Grimacing, Discomfort, Guarding Pain Intervention(s): Monitored during session, Repositioned    Home Living                          Prior Function            PT Goals (current goals can now be found in the care plan section) Progress towards PT goals: Not progressing toward goals - comment    Frequency    Min 1X/week      PT Plan      Co-evaluation              AM-PAC PT "6 Clicks" Mobility   Outcome Measure  Help needed turning from your back to your side while in a flat bed without using bedrails?: Total Help needed moving from lying on your back to sitting on the side of a flat bed without using bedrails?: Total Help needed moving to and from a bed to a chair (including a wheelchair)?: Total Help needed standing  up from a chair using your arms (e.g., wheelchair or bedside chair)?: Total Help needed to walk in hospital room?: Total Help needed climbing 3-5 steps with a railing? : Total 6 Click Score: 6    End of Session   Activity Tolerance: Patient tolerated treatment well;Patient limited by pain Patient left: in bed;with call bell/phone within reach;with bed alarm set Nurse Communication: Mobility status;Other (comment) PT Visit Diagnosis: Difficulty in walking, not elsewhere classified (R26.2);Muscle weakness (generalized) (M62.81);Pain Pain - Right/Left: Left Pain - part of body: Hip     Time: 4010-2725 PT Time Calculation (min) (ACUTE ONLY): 11 min  Charges:    $Therapeutic Activity: 8-22 mins PT General Charges $$ ACUTE PT VISIT: 1 Visit                   Danielle Dess, PTA 08/23/23, 9:54 AM

## 2023-08-23 NOTE — Plan of Care (Signed)
  Problem: Education: Goal: Knowledge of General Education information will improve Description: Including pain rating scale, medication(s)/side effects and non-pharmacologic comfort measures Outcome: Progressing   Problem: Health Behavior/Discharge Planning: Goal: Ability to manage health-related needs will improve Outcome: Progressing   Problem: Clinical Measurements: Goal: Ability to maintain clinical measurements within normal limits will improve Outcome: Progressing   Problem: Clinical Measurements: Goal: Will remain free from infection Outcome: Progressing   Problem: Clinical Measurements: Goal: Cardiovascular complication will be avoided Outcome: Progressing   Problem: Activity: Goal: Risk for activity intolerance will decrease Outcome: Progressing

## 2023-08-23 NOTE — Progress Notes (Signed)
Progress Note   Patient: Jocelyn Hooper:[id number] DOB: 06-21-1941 DOA: 08/08/2023     15 DOS: the patient was seen and examined on 08/23/2023   Brief hospital course: 82yo with h/o IDA/B12 deficiency scoliosis s/p intervention 06/09/2023 who presented on 12/3 with AMS.  She was septic with MRSA bacteremia related to discitis/osteomyelitis at L4-5.  TEE negative.  On Daptomycin and Ceftaroline.  Assessment and Plan:  Sepsis due to MRSA bacteremia/L4-5 osteomyelitis/discitis Sepsis POA as evidenced by fevers, tachycardia, tachypnea, leukocytosis Blood cultures +MRSA in all 4 bottles Patient presented with complaints of severe left hip pain and is status post recent L5-S1 decompression, instrumentation and fusion on 06/09/2023 Initial MRI of the lumbar spine negative but repeat with discitis/osteomyelitis at L4-L5 TEE negative for endocarditis on 11/28 ID is following IV Vancomycin -> IV Daptomycin through 09/27/23 and ceftaroline (completed today) Initial repeat Bcx from 11/26 no growth & final Repeat blood cultures 11/29 again +MRSA  Repeat blood cultures again on 12/2 negative x 5 days Based on persistent pain and minimal ability to participate in therapy, repeated lumbosacral MRI on 12/6 with no significant change from prior PICC line placed For SNF rehab once no longer encephalopathic  Toxic encephalopathy Patient with AMS, mumbling nonsensical speech today, hypersomnolence, and generalized "twitching" Appears to be related to initial dose of Remeron given last night to try to boost mood and appetite Will stop and add to allergy list Also with mildly worsened metabolic acidosis 500 cc bolus (although there is no reversing agent for mirtazepine, this may help both the acidosis and the encephalopathy) Check LFTs, NH4 level Head CT If not clearing, will consult neurology   Inadequate Pain Control  Started MS Contin 15 mg BID -- seems tolerating well but pain is better controlled  and she is encephalopathic so will hold for now Palliative care consulted for assistance Increased gabapentin to 300 TID (was just on at bedtime) Goal of adequate pain control for mobility, pt was ambulating with walker just prior to admission Consider addition of Cymbalta as an outpatient   Acute urinary retention Significant bladder distention noted on CT imaging with patient's inability to urinate afterwards   In-N-Out catheter with 600 cc Unclear etiology, possibly related to spinal infection Foley catheter placed 11/26 Added tamsulosin  Failed voiding trial, foley replaced 12/6 She will need outpatient urology f/u in 2-3 weeks   ASCVD (arteriosclerotic cardiovascular disease) Continue home aspirin and metoprolol   Hyponatremia Appears to be chronic, stable Will monitor periodically   Thrombocytosis Likely reactive, although it is continuing to slowly worsen while leukocytosis improves Will continue to monitor   Ambulatory dysfunction Infection has led to significant weakness She is likely to benefit from SNF rehab Of note, her husband is also down with a bad back and cannot help with care for her at home   She is quite opposed to rehab but appears to be reluctantly agreeable Will add mirtazepine for appetite stimulation + mood enhancement (d/w patient and family) She has not had any therapy since 12/5         Consultants: ID Cardiology Palliative care PT OT [facility] team   Procedures: TEE 11/27 Echocardiogram 11/28   Antibiotics: Vancomycin Daptomycin 12/2-1/13/25 Ceftaroline 12/2-12/11   30 Day Unplanned Readmission Risk Score    Flowsheet Row ED to Hosp-Admission (Current) from 08/08/2023 in [facility]U  30 Day Unplanned Readmission Risk Score (%) 18.51 Filed at 08/23/2023 0801       This score is  the patient's risk of an unplanned readmission within 30 days of being discharged (0 -100%). The score is based on dignosis, age, lab  data, medications, orders, and past utilization.   Low:  0-14.9   Medium: 15-21.9   High: 22-29.9   Extreme: 30 and above           Subjective: She is somnolent, altered, and "twitchy" today - different than prior.  The only change is addition of mirtazepine 7.5 mg last night - and this can happen from this medication.   Objective: Vitals:   08/22/23 2345 08/23/23 0511  BP: (!) 105/48 (!) 102/52  Pulse: 80 77  Resp: 18   Temp: 97.6 F (36.4 C) 97.9 F (36.6 C)  SpO2: 99% 94%    Intake/Output Summary (Last 24 hours) at 08/23/2023 0815 Last data filed at 08/23/2023 0425 Gross per 24 hour  Intake 240 ml  Output 1200 ml  Net -960 ml   Filed Weights   08/09/23 0703 08/11/23 0722 08/23/23 0400  Weight: 56 kg 56 kg 57.5 kg    Exam:  General:  Appears encephalopathic with myoclonus Eyes:   PERRLA, normal lids, iris ENT:  grossly normal hearing, lips & tongue, mmm Neck:  no LAD, masses or thyromegaly Cardiovascular:  RRR, no m/r/g. No LE edema.  Respiratory:   CTA bilaterally with no wheezes/rales/rhonchi.  Normal respiratory effort. Abdomen:  soft, NT, ND Skin:  no rash or induration seen on limited exam Musculoskeletal:  rigidity to muscles with some contracture of R > L hands Psychiatric:  flat mood and affect, speech mostly nonsensical and sparse Neurologic:  generalized subtle myoclonic jerks  Data Reviewed: I have reviewed the patient's lab results since admission.  Pertinent labs for today include:   Na++ 134 CO2 18 CK 20 WBC 9.3 Hgb 8.5 Platelets 587     Family Communication: Daughter was present throughout evaluation  Disposition: Status is: Inpatient Remains inpatient appropriate because: ongoing management     Time spent: 50 minutes  Unresulted Labs (From admission, onward)     Start     Ordered   08/23/23 0500  CK  Weekly,   TIMED     Question:  Specimen collection method  Answer:  Lab=Lab collect   08/20/23 1028              Author: Jonah Blue, MD 08/23/2023 8:15 AM  For on call review [email or web address].

## 2023-08-23 NOTE — Progress Notes (Signed)
Pharmacy Antibiotic Note  Jocelyn Hooper is a 82 y.o. female admitted on 08/08/2023 with bacteremia.  Pharmacy has been consulted for daptomycin dosing. Recently lumbar fusion in sept.  Treated for UTI in October (no cultures available, only UAs).    Today, 08/23/2023 Day # 15 of total abx. Day 8 of daptomycin + ceftaroline  Renal: SCr stable WBC 9.3 Afebrile last 24h 11/24 blood cx: MRSA 11/25 MRI lumbar spine - no evidence of infection 11/25 MRI for Hip pain - Mild osteoarthritis of the left hip. No acute findings; Small bilateral hip joint effusions; Mild tendinosis of the bilateral gluteus medius and minimus tendons. Mild right peritrochanteric bursitis; Mild tendinosis of the bilateral hamstring tendon origins  11/26 blood cx: NG 11/29 blood cx: both sets MRSA 11/28 TEE - no vegetation 11/30 repeat MRI - concern for L4-5 discitis/osteomyelitis 12/2 blood cx: NGTD 12/3 CK 16 12/9 CK 20  Vancomycin levels: Vancomycin levels 11/27-28 Current regimen: Vancomycin 750 mg IV Q24H Preceding dose 11/27 @ 2118  Vanc pk 11/28 @ 0022: 16 ug/mL  Vanc tr 11/28 @ 1943: 8 ug/mL PK Calculations AUC: 282.4 Cmin: 7.6 T 1/2: 19.4hr  Plan: Due to persistent bacteremia and fever, continue daptomycin 500mg  IV q24h (9mg /kg) and ceftaroline 600mg  IV q8h (will dose q8h as normalized CrCl and eGFR are > 44ml/min).  ID planning to do combo therapy for 7-10 days then likely daptomycin monotherapy CK  weekly, not on statin Follow renal function and CBC PICC placed 12/8 Await OPAT orders  Height: 5\' 5"  (165.1 cm) Weight: 57.5 kg (126 lb 12.2 oz) IBW/kg (Calculated) : 57  Temp (24hrs), Avg:98.1 F (36.7 C), Min:97.6 F (36.4 C), Max:98.4 F (36.9 C)  Recent Labs  Lab 08/19/23 0541 08/20/23 0547 08/21/23 0522 08/22/23 0630 08/23/23 0609  WBC 16.9* 15.6* 13.5* 12.1* 9.3  CREATININE 0.78 0.75 0.73 0.82 0.75    Estimated Creatinine Clearance: 47.1 mL/min (by C-G formula based on SCr of 0.75  mg/dL).    Allergies  Allergen Reactions   Avelox [Moxifloxacin Hcl In Nacl] Other (See Comments)    Redness all over in about 5 minutes after taking   Codeine Nausea Only   Acyclovir And Related     Turn red all over    Shrimp [Shellfish Allergy]     Hives     Ciprofloxacin Rash   Moxifloxacin Hives and Rash   Sucralfate Rash    Itching     Antimicrobials this admission: 11/27 ceftriaxone X 1, 11/29-12/1 11/25 vancomycin >> 12/2 12/2: daptomycin + ceftaroline >>    Microbiology results:  11/24 UCx:  Multiple species present, suggest re-collection See above Bcx: MRSA.    Thank you for allowing pharmacy to be a part of this patient's care.  Juliette Alcide, PharmD, BCPS, BCIDP Work Cell: [phone] 08/23/2023 10:54 AM

## 2023-08-24 DIAGNOSIS — B9562 Methicillin resistant Staphylococcus aureus infection as the cause of diseases classified elsewhere: Secondary | ICD-10-CM | POA: Diagnosis not present

## 2023-08-24 DIAGNOSIS — M4317 Spondylolisthesis, lumbosacral region: Secondary | ICD-10-CM | POA: Diagnosis not present

## 2023-08-24 DIAGNOSIS — Z515 Encounter for palliative care: Secondary | ICD-10-CM | POA: Diagnosis not present

## 2023-08-24 DIAGNOSIS — R7881 Bacteremia: Secondary | ICD-10-CM | POA: Diagnosis not present

## 2023-08-24 DIAGNOSIS — M4626 Osteomyelitis of vertebra, lumbar region: Secondary | ICD-10-CM | POA: Diagnosis not present

## 2023-08-24 DIAGNOSIS — M4646 Discitis, unspecified, lumbar region: Secondary | ICD-10-CM | POA: Diagnosis not present

## 2023-08-24 LAB — CBC WITH DIFFERENTIAL/PLATELET
Abs Immature Granulocytes: 0.09 10*3/uL — ABNORMAL HIGH (ref 0.00–0.07)
Basophils Absolute: 0.1 10*3/uL (ref 0.0–0.1)
Basophils Relative: 1 %
Eosinophils Absolute: 0.3 10*3/uL (ref 0.0–0.5)
Eosinophils Relative: 3 %
HCT: 27.7 % — ABNORMAL LOW (ref 36.0–46.0)
Hemoglobin: 8.7 g/dL — ABNORMAL LOW (ref 12.0–15.0)
Immature Granulocytes: 1 %
Lymphocytes Relative: 15 %
Lymphs Abs: 1.3 10*3/uL (ref 0.7–4.0)
MCH: 30.5 pg (ref 26.0–34.0)
MCHC: 31.4 g/dL (ref 30.0–36.0)
MCV: 97.2 fL (ref 80.0–100.0)
Monocytes Absolute: 0.8 10*3/uL (ref 0.1–1.0)
Monocytes Relative: 8 %
Neutro Abs: 6.7 10*3/uL (ref 1.7–7.7)
Neutrophils Relative %: 72 %
Platelets: 573 10*3/uL — ABNORMAL HIGH (ref 150–400)
RBC: 2.85 MIL/uL — ABNORMAL LOW (ref 3.87–5.11)
RDW: 14.2 % (ref 11.5–15.5)
WBC: 9.3 10*3/uL (ref 4.0–10.5)
nRBC: 0 % (ref 0.0–0.2)

## 2023-08-24 LAB — BASIC METABOLIC PANEL
Anion gap: 7 (ref 5–15)
BUN: 18 mg/dL (ref 8–23)
CO2: 22 mmol/L (ref 22–32)
Calcium: 8.5 mg/dL — ABNORMAL LOW (ref 8.9–10.3)
Chloride: 102 mmol/L (ref 98–111)
Creatinine, Ser: 0.73 mg/dL (ref 0.44–1.00)
GFR, Estimated: >60 mL/min (ref >60)
Glucose, Bld: 111 mg/dL — ABNORMAL HIGH (ref 70–99)
Potassium: 3.7 mmol/L (ref 3.5–5.1)
Sodium: 131 mmol/L — ABNORMAL LOW (ref 135–145)

## 2023-08-24 MED ORDER — MORPHINE SULFATE ER 15 MG PO TBCR
15.0000 mg | EXTENDED_RELEASE_TABLET | Freq: Two times a day (BID) | ORAL | Status: DC
  Administered 2023-08-24 – 2023-08-30 (×11): 15 mg via ORAL
  Filled 2023-08-24 (×12): qty 1

## 2023-08-24 NOTE — TOC Progression Note (Signed)
Transition of Care [facility]) - Progression Note    Patient Details  Name: Jocelyn Hooper MRN: [id number] Date of Birth: [birth year]  Transition of Care [facility]) CM/SW Contact  Truddie Hidden, RN Phone Number: 08/24/2023, 11:16 AM  Clinical Narrative:    Bed search started.     Expected Discharge Plan: Home w Home Health Services Barriers to Discharge: Continued Medical Work up  Expected Discharge Plan and Services                                               Social Determinants of Health (SDOH) Interventions SDOH Screenings   Food Insecurity: No Food Insecurity (08/09/2023)  Housing: Low Risk  (08/09/2023)  Transportation Needs: No Transportation Needs (08/09/2023)  Utilities: Not At Risk (08/09/2023)  Financial Resource Strain: Low Risk  (07/01/2023)   Received from [facility]  Tobacco Use: Low Risk  (08/08/2023)    Readmission Risk Interventions     No data to display

## 2023-08-24 NOTE — Progress Notes (Signed)
Progress Note   Patient: Jocelyn Hooper:[id number] DOB: 1941-02-20 DOA: 08/08/2023     16 DOS: the patient was seen and examined on 08/24/2023   Brief hospital course: 82yo with h/o IDA/B12 deficiency scoliosis s/p intervention 06/09/2023 who presented on 12/3 with AMS.  She was septic with MRSA bacteremia related to discitis/osteomyelitis at L4-5.  TEE negative.  On Daptomycin and Ceftaroline.  Assessment and Plan:  Sepsis due to MRSA bacteremia/L4-5 osteomyelitis/discitis Sepsis POA as evidenced by fevers, tachycardia, tachypnea, leukocytosis Blood cultures +MRSA in all 4 bottles Patient presented with complaints of severe left hip pain and is status post recent L5-S1 decompression, instrumentation and fusion on 06/09/2023 Initial MRI of the lumbar spine negative but repeat with discitis/osteomyelitis at L4-L5 TEE negative for endocarditis on 11/28 ID is following IV Vancomycin -> IV Daptomycin through 09/27/23 and ceftaroline (completed today) Initial repeat Bcx from 11/26 no growth & final Repeat blood cultures 11/29 again +MRSA  Repeat blood cultures again on 12/2 negative x 5 days Based on persistent pain and minimal ability to participate in therapy, repeated lumbosacral MRI on 12/6 with no significant change from prior PICC line placed For SNF rehab recommended - starting bed search   Toxic encephalopathy Due to one time dose of Remeron 7.5 mg given to try to boost mood and appetite Medication was stopped and add to allergy list Negative head CT Symptoms resolved after cessation of Remeron   Inadequate Pain Control  Started MS Contin 15 mg BID -- seems tolerating well and pain is better controlled Stopped when patient became encephalopathic Pain has recurred without MS Contin, so will resume Palliative care consulted for assistance Increased gabapentin to 300 TID (was just on at bedtime) Goal of adequate pain control for mobility, pt was ambulating with walker just prior  to admission Consider addition of Cymbalta as an outpatient   Acute urinary retention Significant bladder distention noted on CT imaging with patient's inability to urinate afterwards   In-N-Out catheter with 600 cc Unclear etiology, possibly related to spinal infection Foley catheter placed 11/26 Added tamsulosin  Failed voiding trial, foley replaced 12/6 She will need outpatient urology f/u in 2-3 weeks   ASCVD (arteriosclerotic cardiovascular disease) Continue home aspirin and metoprolol   Hyponatremia Appears to be chronic, stable Will monitor periodically   Thrombocytosis Likely reactive Will continue to monitor   Ambulatory dysfunction Infection has led to significant weakness She is likely to benefit from SNF rehab Of note, her husband is also down with a bad back and cannot help with care for her at home   She is quite opposed to rehab but appears to be reluctantly agreeable Awaiting placement       Consultants: ID Cardiology Palliative care PT OT [facility] team   Procedures: TEE 11/27 Echocardiogram 11/28   Antibiotics: Vancomycin Daptomycin 12/2-1/13/25 Ceftaroline 12/2-12/11    30 Day Unplanned Readmission Risk Score    Flowsheet Row ED to Hosp-Admission (Current) from 08/08/2023 in [facility]U  30 Day Unplanned Readmission Risk Score (%) 18.21 Filed at 08/24/2023 0801       This score is the patient's risk of an unplanned readmission within 30 days of being discharged (0 -100%). The score is based on dignosis, age, lab data, medications, orders, and past utilization.   Low:  0-14.9   Medium: 15-21.9   High: 22-29.9   Extreme: 30 and above           Subjective: Back to baseline mentation today -  mirtazepine effects have washed out.  No longer somnolent or myoclonic.  Morphine was also stopped and her back/hip pain are worsening, wants to start back with medication.   Objective: Vitals:   08/24/23 1228 08/24/23 1539   BP: (!) 97/58 (!) 110/52  Pulse: 92 89  Resp: 17 17  Temp: 98 F (36.7 C) 98.2 F (36.8 C)  SpO2: 98% 97%    Intake/Output Summary (Last 24 hours) at 08/24/2023 1720 Last data filed at 08/24/2023 0405 Gross per 24 hour  Intake --  Output 800 ml  Net -800 ml   Filed Weights   08/09/23 0703 08/11/23 0722 08/23/23 0400  Weight: 56 kg 56 kg 57.5 kg    Exam:  General:  Appears calm and comfortable and is in NAD Eyes:   EOMI, normal lids, iris ENT:  grossly normal hearing, lips & tongue, mmm Neck:  no LAD, masses or thyromegaly Cardiovascular:  RRR, no m/r/g. No LE edema.  Respiratory:   CTA bilaterally with no wheezes/rales/rhonchi.  Normal respiratory effort. Abdomen:  soft, NT, ND Skin:  no rash or induration seen on limited exam Musculoskeletal:  grossly normal tone BUE/BLE, no bony abnormality Psychiatric:  flat mood and affect, speech sparse but appropriate, AOx3, does not engage much Neurologic:  CN 2-12 grossly intact, moves all extremities in coordinated fashion  Data Reviewed: I have reviewed the patient's lab results since admission.  Pertinent labs for today include:   Na++ 131 Glucose 111 WBC 9.3 Hgb 8.7 - stable     Family Communication: Daughter and husband were present throughout evaluation  Disposition: Status is: Inpatient Remains inpatient appropriate because: awaiting placement     Time spent: 35 minutes  Unresulted Labs (From admission, onward)     Start     Ordered   08/25/23 0500  CBC with Differential/Platelet  Tomorrow morning,   R       Question:  Specimen collection method  Answer:  Lab=Lab collect   08/24/23 1720   08/25/23 0500  Basic metabolic panel  Tomorrow morning,   R       Question:  Specimen collection method  Answer:  Lab=Lab collect   08/24/23 1720   08/23/23 0500  CK  Weekly,   TIMED     Question:  Specimen collection method  Answer:  Lab=Lab collect   08/20/23 1028             Author: Jonah Blue,  MD 08/24/2023 5:20 PM  For on call review [email or web address].

## 2023-08-24 NOTE — Progress Notes (Signed)
Palliative Care Progress Note, Assessment & Plan   Patient Name: Jocelyn Hooper       Date: 08/24/2023 DOB: 05-Feb-1941  Age: 82 y.o. MRN#: [id number] Attending Physician: Jonah Blue, MD Primary Care Physician: Danella Penton, MD Admit Date: 08/08/2023  Subjective: Patient is sitting up in bed in no apparent distress.  Respirations are even and unlabored.  She acknowledges my presence and is able to make her wishes known.  Her husband and her daughter Jocelyn Hooper are at bedside during my visit.  HPI: 82 y.o. female  with past medical history of GERD, B12 deficiency, trigeminal neuralgia syndrome, iron deficiency anemia, arteriosclerotic disease, nephrolithiasis, bilateral TKA, spondylolisthesis of lumbosacral region, discitis of lumbar region, scoliosis s/p recent intervention (L5-S1 decompression with fusion 06/09/2023) admitted on 08/08/2023 with AMS and left hip pain.   From workup, patient found to have MRSA bacteremia and ID was consulted.  IV vancomycin initiated but subsequently changed to Dapto and Ceftin Rolan 9 dual MRSA coverage.   MRI of lumbar spine repeat cecitis of L4/L5 and edematous paraspinal tissue at L5.   Patient experiencing urinary retention and Foley placed.    TEE revealed no endocarditis.   Plan remains to continue blood cultures until clear of bacteremia and PICC cannot be placed until that time.   PMT was consulted for "uncontrolled pain".  Summary of counseling/coordination of care: Extensive chart review completed prior to meeting patient including labs, vital signs, imaging, progress notes, orders, and available advanced directive documents from current and previous encounters.   After reviewing the patient's chart and assessing the patient at bedside, I spoke with patient  and family in regards to symptom management and plan of care.  Symptoms assessed.  Jocelyn Hooper endorses that patient was not coherent and acting like herself yesterday after 1 dose of Zyprexa.  Since that medication has been stopped, Jocelyn Hooper says that patient is almost back to baseline mentally.  She is making sense, speaking in more complete sentences, and more alert and oriented.  Discussed that medication has been discontinued.  Patient has no acute complaints at this time.  Jocelyn Hooper denies issues as well.  No adjustment to [facility] needed.  I again highlighted that palliative medicine assist with boundaries and goals of care discussions.  Jocelyn Hooper shares that the next step for patient is to be discharged to rehab.  She is hopeful that [facility] can get patient into [facility] but is understanding that it is difficult.  TOC following closely for discharge planning.  I again discussed boundaries of care with Jocelyn Hooper.  Patient declined to be part of this discussion.  Full code and full scope remain.  Patient/family have PMT contact information and were encouraged to contact PMT with any future acute palliative needs.   PMT will monitor the patient peripherally and shadow her chart. Please re-engage with PMT if goals change, at patient/family's request, or if patient's health deteriorates during hospitalization.    Physical Exam Vitals reviewed.  Constitutional:      General: She is not in acute distress.    Appearance: She is normal weight.  HENT:     Head: Normocephalic.     Mouth/Throat:     Mouth: Mucous membranes  are moist.  Pulmonary:     Effort: Pulmonary effort is normal.  Abdominal:     Palpations: Abdomen is soft.  Skin:    General: Skin is warm and dry.  Neurological:     Mental Status: She is alert.  Psychiatric:        Mood and Affect: Mood normal.        Behavior: Behavior normal.        Judgment: Judgment normal.             Total Time 35 minutes   Time spent includes: Detailed review of  medical records (labs, imaging, vital signs), medically appropriate exam (mental status, respiratory, cardiac, skin), discussed with treatment team, counseling and educating patient, family and staff, documenting clinical information, medication management and coordination of care.  [name], DNP, FNP-BC Palliative Medicine Team

## 2023-08-24 NOTE — Plan of Care (Signed)

## 2023-08-24 NOTE — [place] FL2 (Signed)
[place] MEDICAID FL2 LEVEL OF CARE FORM     IDENTIFICATION  Patient Name: Jocelyn Hooper Birthdate: November 11, 1940 Sex: female Admission Date (Current Location): 08/08/2023  [place] and [place] Number:  Chiropodist and Address:  [facility], [address] [place], [place] [address]      Provider Number: [id number]  Attending Physician Name and Address:  Jonah Blue, MD  Relative Name and Phone Number:  [name]  [phone]    Current Level of Care: Hospital Recommended Level of Care: Skilled Nursing Facility Prior Approval Number:    Date Approved/Denied:   PASRR Number: [id number]  Discharge Plan: Home    Current Diagnoses: Patient Active Problem List   Diagnosis Date Noted   Myoclonus 08/23/2023   Inadequate pain control 08/16/2023   Discitis of lumbar region 08/15/2023   Sepsis with encephalopathy without septic shock (HCC) 08/11/2023   MRSA bacteremia 08/11/2023   SIRS (systemic inflammatory response syndrome) (HCC) 08/08/2023   Acute urinary retention 08/08/2023   Altered mental status 08/08/2023   Spondylolisthesis of lumbosacral region 06/09/2023   Localized edema 05/18/2018   S/P total knee arthroplasty 09/27/2017   ASCVD (arteriosclerotic cardiovascular disease) 10/15/2016   Esophagitis, reflux 10/15/2016   GERD (gastroesophageal reflux disease) 10/07/2016   Acute renal insufficiency 10/07/2016   Thrombocytopenia (HCC) 10/07/2016   Rectocele 05/28/2016   Post-herpetic polyneuropathy 05/17/2016   Bladder outlet obstruction 05/15/2016   Internal derangement of right knee 09/13/2015   Status post total left knee replacement 09/09/2015   B12 deficiency 12/28/2014   Trigeminal neuralgia syndrome 12/28/2014   Thoracogenic scoliosis of thoracic region 12/28/2014   DDD (degenerative disc disease), lumbar 05/09/2014   Kidney stone 06/01/2012   Renal colic 06/01/2012   Sciatica 06/01/2012   EDEMA 10/18/2009   Mixed  hyperlipidemia 02/20/2009   Sinusitis, chronic 02/20/2009   Asthma 02/20/2009   GERD 02/20/2009   Exertional shortness of breath 02/20/2009   Chest pain with moderate risk for cardiac etiology 02/20/2009    Orientation RESPIRATION BLADDER Height & Weight     Self  Normal External catheter, Incontinent Weight: 57.5 kg Height:  5\' 5"  (165.1 cm)  BEHAVIORAL SYMPTOMS/MOOD NEUROLOGICAL BOWEL NUTRITION STATUS  Other (Comment) (n/a)  (n/a) Continent Diet  AMBULATORY STATUS COMMUNICATION OF NEEDS Skin   Limited Assist Verbally  (Erythema to perineum and sacrum)                       Personal Care Assistance Level of Assistance  Bathing, Feeding, Dressing Bathing Assistance: Limited assistance Feeding assistance: Limited assistance Dressing Assistance: Limited assistance     Functional Limitations Info  Sight, Hearing Sight Info: Impaired Hearing Info: Impaired      SPECIAL CARE FACTORS FREQUENCY  PT (By licensed PT), OT (By licensed OT)     PT Frequency: Min 2x weekly OT Frequency: Min 2x weekly            Contractures Contractures Info: Not present    Additional Factors Info  Code Status, Allergies Code Status Info: FULL Allergies Info: Avelox (Moxifloxacin Hcl In Nacl), Codeine, Acyclovir And Related, Shrimp (Shellfish Allergy), Ciprofloxacin, Moxifloxacin, Sucralfate           Current Medications (08/24/2023):  This is the current hospital active medication list Current Facility-Administered Medications  Medication Dose Route Frequency Provider Last Rate Last Admin   acetaminophen (TYLENOL) tablet 650 mg  650 mg Oral Q6H Georgiann Cocker, FNP   650 mg at 08/24/23 1040  Or   acetaminophen (TYLENOL) suppository 650 mg  650 mg Rectal Q6H Georgiann Cocker, FNP       aspirin EC tablet 81 mg  81 mg Oral Daily Verdene Lennert, MD   81 mg at 08/24/23 0815   bisacodyl (DULCOLAX) EC tablet 5 mg  5 mg Oral Daily PRN Esaw Grandchild A, DO   5 mg at 08/20/23 1839    Chlorhexidine Gluconate Cloth 2 % PADS 6 each  6 each Topical Daily Agbata, Tochukwu, MD   6 each at 08/24/23 0817   DAPTOmycin (CUBICIN) IVPB 500 mg/70mL premix  9 mg/kg Intravenous Q1400 Foye Deer, RPH 100 mL/hr at 08/23/23 1338 500 mg at 08/23/23 1338   enoxaparin (LOVENOX) injection 40 mg  40 mg Subcutaneous Q24H Verdene Lennert, MD   40 mg at 08/23/23 1634   estradiol (ESTRACE) tablet 0.5 mg  0.5 mg Oral Daily Esaw Grandchild A, DO   0.5 mg at 08/24/23 0815   feeding supplement (ENSURE ENLIVE / ENSURE PLUS) liquid 237 mL  237 mL Oral BID BM Agbata, Tochukwu, MD   237 mL at 08/24/23 0817   fentaNYL (SUBLIMAZE) injection 25 mcg  25 mcg Intravenous Q2H PRN Esaw Grandchild A, DO   25 mcg at 08/14/23 1158   fluticasone (FLONASE) 50 MCG/ACT nasal spray 1 spray  1 spray Each Nare Daily Esaw Grandchild A, DO   1 spray at 08/24/23 0816   gabapentin (NEURONTIN) capsule 300 mg  300 mg Oral TID Esaw Grandchild A, DO   300 mg at 08/24/23 0816   HYDROcodone-acetaminophen (NORCO/VICODIN) 5-325 MG per tablet 1-2 tablet  1-2 tablet Oral Q6H PRN Georgiann Cocker, FNP   1 tablet at 08/21/23 1945   loratadine (CLARITIN) tablet 10 mg  10 mg Oral Daily Verdene Lennert, MD   10 mg at 08/24/23 0816   magnesium hydroxide (MILK OF MAGNESIA) suspension 15 mL  15 mL Oral Daily PRN Esaw Grandchild A, DO       melatonin tablet 10 mg  10 mg Oral QHS PRN Esaw Grandchild A, DO   10 mg at 08/20/23 2203   metoprolol succinate (TOPROL-XL) 24 hr tablet 25 mg  25 mg Oral Daily Verdene Lennert, MD   25 mg at 08/24/23 0816   ondansetron (ZOFRAN) tablet 4 mg  4 mg Oral Q6H PRN Verdene Lennert, MD       Or   ondansetron (ZOFRAN) injection 4 mg  4 mg Intravenous Q6H PRN Verdene Lennert, MD       Oral care mouth rinse  15 mL Mouth Rinse PRN Agbata, Tochukwu, MD       pantoprazole (PROTONIX) EC tablet 40 mg  40 mg Oral Daily Esaw Grandchild A, DO   40 mg at 08/24/23 0816   polyethylene glycol (MIRALAX / GLYCOLAX) packet 17 g  17 g  Oral Daily Esaw Grandchild A, DO   17 g at 08/24/23 0816   senna-docusate (Senokot-S) tablet 1 tablet  1 tablet Oral BID Esaw Grandchild A, DO   1 tablet at 08/24/23 0816   sodium chloride flush (NS) 0.9 % injection 10-40 mL  10-40 mL Intracatheter Q12H Jonah Blue, MD   10 mL at 08/24/23 0817   sodium chloride flush (NS) 0.9 % injection 10-40 mL  10-40 mL Intracatheter PRN Jonah Blue, MD       sodium chloride flush (NS) 0.9 % injection 3 mL  3 mL Intravenous Q12H Verdene Lennert, MD   3 mL at 08/24/23 [phone]  tamsulosin (FLOMAX) capsule 0.4 mg  0.4 mg Oral QPC supper Jonah Blue, MD   0.4 mg at 08/23/23 1634   topiramate (TOPAMAX) tablet 150 mg  150 mg Oral QHS Esaw Grandchild A, DO   150 mg at 08/23/23 2106     Discharge Medications: Please see discharge summary for a list of discharge medications.  Relevant Imaging Results:  Relevant Lab Results:   Additional Information SSN# [id number]  Truddie Hidden, RN

## 2023-08-24 NOTE — Progress Notes (Signed)
Occupational Therapy Treatment Patient Details Name: Jocelyn Hooper MRN: [id number] DOB: 08-19-1941 Today's Date: 08/24/2023   History of present illness Pt is an 82 yo F presenting due to AMS, with MRSA, sepsis, acute urinary retention, acute metabolic encephalopathy, arteriosclerotic CVD, and hypokalemia. PMH significant for L5-S1 TLIF (06/09/2023), anemia, L shoulder bursitis, CAD, CSF leak, IBS, Scoliosis, Trigeminal neuralgia, and B TKR.   OT comments  Chart reviewed to date, pt greeted in bed with family present, alert, oriented to self, place, month/year. Tx session targeted improving functional activity tolerance to improve participation in ADL tasks. Slight improvements noted in bed mobility with pt requiring MOD-MAX A. Pt able to sit on edge of bed for approx 5 minutes with use of bed rails to assist in  upright sitting with pt continued noted posterior and R lateral lean, tolerated sitting on edge of bed for a total of approx 12 minutes. STS completed with MAX A +2 with RW, TOTAL A +2 with use of stedy to transfer to bedside chair. Pt is making progress towards goals, discharge remains appropriate.       If plan is discharge home, recommend the following:  Help with stairs or ramp for entrance;Assist for transportation;Assistance with cooking/housework;Two people to help with walking and/or transfers;Two people to help with bathing/dressing/bathroom   Equipment Recommendations  Other (comment) (defer)    Recommendations for Other Services      Precautions / Restrictions Precautions Precautions: Fall Restrictions Weight Bearing Restrictions: No       Mobility Bed Mobility Overal bed mobility: Needs Assistance Bed Mobility: Supine to Sit Rolling: Max assist, Used rails, Mod assist   Supine to sit: Max assist, +2 for physical assistance, HOB elevated     General bed mobility comments: with frequent multi modal cues    Transfers Overall transfer level: Needs  assistance Equipment used: Rolling walker (2 wheels) Transfers: Sit to/from Stand, Bed to chair/wheelchair/BSC Sit to Stand: Max assist, +2 physical assistance (able to achieve upright standing with MAX A +2 and multi modal cues)             Transfer via Lift Equipment: Stedy   Balance Overall balance assessment: Needs assistance Sitting-balance support: Feet supported, Bilateral upper extremity supported Sitting balance-Leahy Scale: Poor Sitting balance - Comments: Fair-poor, able to maintain static stiting for approx 5 minutes whithout support provided; Tolerated sitting on edge of bed for approx 12 minutes Postural control: Right lateral lean, Posterior lean   Standing balance-Leahy Scale: Zero                             ADL either performed or assessed with clinical judgement   ADL Overall ADL's : Needs assistance/impaired                     Lower Body Dressing: Maximal assistance   Toilet Transfer: Maximal assistance;+2 for physical assistance;+2 for safety/equipment;Total assistance Toilet Transfer Details (indicate cue type and reason): with use of stedy Toileting- Clothing Manipulation and Hygiene: Maximal assistance;Total assistance       Functional mobility during ADLs: Maximal assistance;Cueing for safety;Cueing for sequencing;+2 for physical assistance;Total assistance (with use of sara stedy)      Extremity/Trunk Assessment              Vision       Perception     Praxis      Cognition Arousal: Alert Behavior During Therapy: Flat affect Overall  Cognitive Status: Impaired/Different from baseline Area of Impairment: Attention, Memory, Following commands, Safety/judgement, Awareness                 Orientation Level: Disoriented to, Situation Current Attention Level: Sustained Memory: Decreased short-term memory Following Commands: Follows one step commands with increased time (with increased cueing)   Awareness:  Emergent            Exercises Other Exercises Other Exercises: edu re: role of OT    Shoulder Instructions       General Comments vss throughout    Pertinent Vitals/ Pain       Pain Assessment Pain Assessment: Faces Faces Pain Scale: Hurts little more Pain Location: L hip Pain Descriptors / Indicators: Grimacing, Discomfort, Guarding Pain Intervention(s): Monitored during session, Repositioned  Home Living                                          Prior Functioning/Environment              Frequency  Min 1X/week        Progress Toward Goals  OT Goals(current goals can now be found in the care plan section)  Progress towards OT goals: Progressing toward goals  Acute Rehab OT Goals Time For Goal Achievement: 08/28/23 Potential to Achieve Goals: Fair  Plan      Co-evaluation                 AM-PAC OT "6 Clicks" Daily Activity     Outcome Measure   Help from another person eating meals?: None Help from another person taking care of personal grooming?: A Little Help from another person toileting, which includes using toliet, bedpan, or urinal?: A Lot Help from another person bathing (including washing, rinsing, drying)?: A Lot Help from another person to put on and taking off regular upper body clothing?: A Lot Help from another person to put on and taking off regular lower body clothing?: A Lot 6 Click Score: 15    End of Session Equipment Utilized During Treatment: Other (comment);Rolling walker (2 wheels);Gait belt (stedy)  OT Visit Diagnosis: Muscle weakness (generalized) (M62.81);Other abnormalities of gait and mobility (R26.89);Unsteadiness on feet (R26.81)   Activity Tolerance Patient tolerated treatment well   Patient Left in chair;with call bell/phone within reach;with chair alarm set   Nurse Communication Mobility status        Time: 2130-8657 OT Time Calculation (min): 34 min  Charges: OT General  Charges $OT Visit: 1 Visit OT Treatments $Self Care/Home Management : 8-22 mins $Therapeutic Activity: 8-22 mins  Oleta Mouse, OTD OTR/L  08/24/23, 1:34 PM

## 2023-08-24 NOTE — Progress Notes (Signed)
Date of Admission:  08/08/2023     ID: Jocelyn Hooper is a 82 y.o. female  Principal Problem:   Sepsis with encephalopathy without septic shock (HCC) Active Problems:   Mixed hyperlipidemia   GERD (gastroesophageal reflux disease)   ASCVD (arteriosclerotic cardiovascular disease)   B12 deficiency   Trigeminal neuralgia syndrome   Spondylolisthesis of lumbosacral region   Acute urinary retention   Altered mental status   MRSA bacteremia   Discitis of lumbar region   Inadequate pain control   Myoclonus  On 06/09/23 underwent L5-S1 decompression with fusion at [facility] B/l TKA ( Left 2013, Rt 2019) Left hip pain- says she has bursitits and gets injection  MRI hip showed bursa in rt peritrochanteric area  Subjective: Pt much better No twitching   Medications:   acetaminophen  650 mg Oral Q6H   Or   acetaminophen  650 mg Rectal Q6H   aspirin EC  81 mg Oral Daily   Chlorhexidine Gluconate Cloth  6 each Topical Daily   enoxaparin (LOVENOX) injection  40 mg Subcutaneous Q24H   estradiol  0.5 mg Oral Daily   feeding supplement  237 mL Oral BID BM   fluticasone  1 spray Each Nare Daily   gabapentin  300 mg Oral TID   loratadine  10 mg Oral Daily   metoprolol succinate  25 mg Oral Daily   morphine  15 mg Oral Q12H   pantoprazole  40 mg Oral Daily   polyethylene glycol  17 g Oral Daily   senna-docusate  1 tablet Oral BID   sodium chloride flush  10-40 mL Intracatheter Q12H   sodium chloride flush  3 mL Intravenous Q12H   tamsulosin  0.4 mg Oral QPC supper   topiramate  150 mg Oral QHS    Objective: Vital signs in last 24 hours: Patient Vitals for the past 24 hrs:  BP Temp Temp src Pulse Resp SpO2  08/24/23 1228 (!) 97/58 98 F (36.7 C) -- 92 17 98 %  08/24/23 0404 105/67 97.9 F (36.6 C) Oral 78 -- 97 %  08/23/23 2328 (!) 97/56 97.9 F (36.6 C) Oral 65 16 97 %  08/23/23 2006 93/61 98 F (36.7 C) Oral 76 16 99 %  08/23/23 1749 102/63 98.4 F (36.9 C) -- 84 18 96 %      LDA Foley   PHYSICAL EXAM:  General:alert No twitching Lungs: Clear to auscultation bilaterally. No Wheezing or Rhonchi. No rales. Heart: Regular rate and rhythm, no murmur, rub or gallop. Abdomen: Soft, non-tender,not distended. Bowel sounds normal. No masses Extremities: atraumatic, no cyanosis. No edema. No clubbing B/l knee surgical scars foley Skin: No rashes or lesions. Or bruising Lymph: Cervical, supraclavicular normal. Neurologic: anon focal  Lab Results    Latest Ref Rng & Units 08/24/2023    4:42 AM 08/23/2023    6:09 AM 08/22/2023    6:30 AM  CBC  WBC 4.0 - 10.5 K/uL 9.3  9.3  12.1   Hemoglobin 12.0 - 15.0 g/dL 8.7  8.5  8.5   Hematocrit 36.0 - 46.0 % 27.7  25.7  25.8   Platelets 150 - 400 K/uL 573  587  591        Latest Ref Rng & Units 08/24/2023    4:42 AM 08/23/2023    6:09 AM 08/22/2023    6:30 AM  CMP  Glucose 70 - 99 mg/dL 469  98  629   BUN 8 - 23 mg/dL  18  18  18    Creatinine 0.44 - 1.00 mg/dL 4.09  8.11  9.14   Sodium 135 - 145 mmol/L 131  134  130   Potassium 3.5 - 5.1 mmol/L 3.7  3.8  3.8   Chloride 98 - 111 mmol/L 102  108  101   CO2 22 - 32 mmol/L 22  18  21    Calcium 8.9 - 10.3 mg/dL 8.5  8.5  8.3   Total Protein 6.5 - 8.1 g/dL  5.5    Total Bilirubin <1.2 mg/dL  0.3    Alkaline Phos 38 - 126 U/L  255    AST 15 - 41 U/L  19    ALT 0 - 44 U/L  23        Microbiology: 08/08/23- 4/4 MRSA bacteremia 08/10/23-BC - ng 11/29 BC + mRSA 12/2 Bc    Assessment/Plan:  Myoclonic jerks has resolved Likely from medication- MS contin, Remeron and ceftaroline were discontinued   MRSA bacteremia  Recent spine fusion 06/09/23 underwent L5-S1 laminotomy, facectomy and posterior lumbar interbody fusion High bio-burden Now has  discitis/osteo a L4-L5 area Edematous paraspinal soft tissue at L5 Partial collapse of L5 vertebral body TEE no endocarditis Leucocytosis- fluctuates Pt on dual  dapto and ceftaroline (Dual MRSA covergae) since 12/2  because of persistent fever and bacteremia- now fever has resolved Continue antibiotic Daptomycin - will need 6 weeks minimum until 09/27/23  MRI brain small vessel disease Repeat MRI lumbar spine- discitis L4/L5, edematous paraspinal tissue at l5 Repeat blood culture from 11/29 positive for gram positive cocci Repeat blood culture from 12/2 so far negative     Urinary retention- has foley, failed voiding trial   Discussed the management with family at bed side

## 2023-08-25 DIAGNOSIS — A4102 Sepsis due to Methicillin resistant Staphylococcus aureus: Secondary | ICD-10-CM | POA: Diagnosis not present

## 2023-08-25 DIAGNOSIS — R6520 Severe sepsis without septic shock: Secondary | ICD-10-CM | POA: Diagnosis not present

## 2023-08-25 DIAGNOSIS — G9341 Metabolic encephalopathy: Secondary | ICD-10-CM | POA: Diagnosis not present

## 2023-08-25 LAB — BASIC METABOLIC PANEL
Anion gap: 8 (ref 5–15)
BUN: 16 mg/dL (ref 8–23)
CO2: 21 mmol/L — ABNORMAL LOW (ref 22–32)
Calcium: 8.5 mg/dL — ABNORMAL LOW (ref 8.9–10.3)
Chloride: 105 mmol/L (ref 98–111)
Creatinine, Ser: 0.76 mg/dL (ref 0.44–1.00)
GFR, Estimated: >60 mL/min (ref >60)
Glucose, Bld: 108 mg/dL — ABNORMAL HIGH (ref 70–99)
Potassium: 3.5 mmol/L (ref 3.5–5.1)
Sodium: 134 mmol/L — ABNORMAL LOW (ref 135–145)

## 2023-08-25 LAB — CBC WITH DIFFERENTIAL/PLATELET
Abs Immature Granulocytes: 0.09 10*3/uL — ABNORMAL HIGH (ref 0.00–0.07)
Basophils Absolute: 0.1 10*3/uL (ref 0.0–0.1)
Basophils Relative: 1 %
Eosinophils Absolute: 0.2 10*3/uL (ref 0.0–0.5)
Eosinophils Relative: 2 %
HCT: 26.6 % — ABNORMAL LOW (ref 36.0–46.0)
Hemoglobin: 8.5 g/dL — ABNORMAL LOW (ref 12.0–15.0)
Immature Granulocytes: 1 %
Lymphocytes Relative: 19 %
Lymphs Abs: 1.8 10*3/uL (ref 0.7–4.0)
MCH: 30.6 pg (ref 26.0–34.0)
MCHC: 32 g/dL (ref 30.0–36.0)
MCV: 95.7 fL (ref 80.0–100.0)
Monocytes Absolute: 0.5 10*3/uL (ref 0.1–1.0)
Monocytes Relative: 6 %
Neutro Abs: 6.9 10*3/uL (ref 1.7–7.7)
Neutrophils Relative %: 71 %
Platelets: 548 10*3/uL — ABNORMAL HIGH (ref 150–400)
RBC: 2.78 MIL/uL — ABNORMAL LOW (ref 3.87–5.11)
RDW: 14 % (ref 11.5–15.5)
Smear Review: NORMAL
WBC: 9.5 10*3/uL (ref 4.0–10.5)
nRBC: 0 % (ref 0.0–0.2)

## 2023-08-25 NOTE — Plan of Care (Signed)

## 2023-08-25 NOTE — Progress Notes (Signed)
Physical Therapy Treatment Patient Details Name: Jocelyn Hooper MRN: [id number] DOB: 07-22-1941 Today's Date: 08/25/2023   History of Present Illness Pt is an 82 yo F presenting due to AMS, with MRSA, sepsis, acute urinary retention, acute metabolic encephalopathy, arteriosclerotic CVD, and hypokalemia. PMH significant for L5-S1 TLIF (06/09/2023), anemia, L shoulder bursitis, CAD, CSF leak, IBS, Scoliosis, Trigeminal neuralgia, and B TKR.    PT Comments  Pt was long sitting in bed finishing up breakfast upon arrival. She is alert however lacks insight of her deficits. "I want to walk." Explained to pt that she has been very weak and has had difficulty even in standing. Pt was agreeable to session and remains cooperative throughout. She requires max assist of 1 to safely achieve EOB sitting. Stood EOB 3 x from elevated bed height with max assist. Vcs throughout for fwd wt shift and overall technique improvements. Pt did cleared hips but has very flexed posture and only able to stand for ~ 10 sec prior to needing seated rest. HR did hit 140s bpm but quickly resolved with seated rest and relaxation techniques. Overall pt remains far from her baseline. DC recs remain appropriate to maximize her independence while decreasing caregiver burden.    If plan is discharge home, recommend the following: Two people to help with walking and/or transfers;A lot of help with bathing/dressing/bathroom;Assistance with cooking/housework;Direct supervision/assist for medications management;Direct supervision/assist for financial management;Assist for transportation;Help with stairs or ramp for entrance;Supervision due to cognitive status     Equipment Recommendations  Other (comment) (Defer to next level of care)       Precautions / Restrictions Precautions Precautions: Fall Restrictions Weight Bearing Restrictions: No Other Position/Activity Restrictions: watch HR     Mobility  Bed Mobility Overal bed mobility:  Needs Assistance Bed Mobility: Supine to Sit, Sit to Supine Rolling: Max assist, Used rails, Mod assist Sidelying to sit: Max assist Supine to sit: Max assist Sit to supine: Max assist General bed mobility comments: pt continues to require extensive max asssist to achieve EOB sitting or to return to bed after OOB activity    Transfers Overall transfer level: Needs assistance Equipment used: Rolling walker (2 wheels) Transfers: Sit to/from Stand Sit to Stand: Max assist, From elevated surface  General transfer comment: Max assist to stand from elevated bed height. Constant vcs for technique, fwd wt shift, and effort. Pt only able to maintain stnding with very flexed posture x ~ 10 sec 3 x. Pt is extremely weak and deconditioned. HR hit 144bpm at peak with static EOB exercises/standing attempts    Ambulation/Gait    General Gait Details: unsafe to attempt. poor static standing abilities.   Balance Overall balance assessment: Needs assistance Sitting-balance support: Feet supported, Bilateral upper extremity supported       Standing balance support: Bilateral upper extremity supported, Reliant on assistive device for balance Standing balance-Leahy Scale: Poor Standing balance comment: max assist to safely static stand with poor posture and posterior push noted throughout       Cognition Arousal: Alert Behavior During Therapy: Flat affect Overall Cognitive Status: Impaired/Different from baseline   Following Commands: Follows one step commands with increased time  General Comments: Pt was A however author questions her overall insight of situation. Poor awareness of her deficits.               Pertinent Vitals/Pain Pain Assessment Pain Assessment: No/denies pain Pain Score: 4  Pain Location: L hip Pain Descriptors / Indicators: Grimacing, Discomfort, Guarding Pain Intervention(s):  Premedicated before session, Limited activity within patient's tolerance, Monitored during  session     PT Goals (current goals can now be found in the care plan section) Acute Rehab PT Goals Patient Stated Goal: get back to walking Progress towards PT goals: Not progressing toward goals - comment    Frequency    Min 1X/week       AM-PAC PT "6 Clicks" Mobility   Outcome Measure  Help needed turning from your back to your side while in a flat bed without using bedrails?: A Lot Help needed moving from lying on your back to sitting on the side of a flat bed without using bedrails?: A Lot Help needed moving to and from a bed to a chair (including a wheelchair)?: Total Help needed standing up from a chair using your arms (e.g., wheelchair or bedside chair)?: Total Help needed to walk in hospital room?: Total Help needed climbing 3-5 steps with a railing? : Total 6 Click Score: 8    End of Session Equipment Utilized During Treatment: Gait belt Activity Tolerance: Patient tolerated treatment well;Patient limited by fatigue Patient left: in bed;with call bell/phone within reach;with bed alarm set Nurse Communication: Mobility status PT Visit Diagnosis: Difficulty in walking, not elsewhere classified (R26.2);Muscle weakness (generalized) (M62.81);Pain Pain - Right/Left: Left Pain - part of body: Hip     Time: 1040-1100 PT Time Calculation (min) (ACUTE ONLY): 20 min  Charges:    $Therapeutic Activity: 8-22 mins PT General Charges $$ ACUTE PT VISIT: 1 Visit                     Jetta Lout PTA 08/25/23, 2:23 PM

## 2023-08-25 NOTE — TOC Progression Note (Addendum)
Transition of Care [facility]) - Progression Note    Patient Details  Name: Jocelyn Hooper MRN: [id number] Date of Birth: 1941-06-21  [facility] CM/SW Contact  Truddie Hidden, RN Phone Number: 08/25/2023, 1:09 PM  Clinical Narrative:    Attempt to reach [place], patient's daughter to give bed offer for [facility]. No answer. Left a message.  Retrieved call from [place]. Bed offer given for [facility]. [name] would like to discuss bed offer with her brother before deciding on bed offer. CMS guideline for first available bed and right to appeal iterated.    Expected Discharge Plan: Home w Home Health Services Barriers to Discharge: Continued Medical Work up  Expected Discharge Plan and Services                                               Social Determinants of Health (SDOH) Interventions SDOH Screenings   Food Insecurity: No Food Insecurity (08/09/2023)  Housing: Low Risk  (08/09/2023)  Transportation Needs: No Transportation Needs (08/09/2023)  Utilities: Not At Risk (08/09/2023)  Financial Resource Strain: Low Risk  (07/01/2023)   Received from [facility]  Tobacco Use: Low Risk  (08/08/2023)    Readmission Risk Interventions     No data to display

## 2023-08-25 NOTE — Progress Notes (Signed)
PROGRESS NOTE    Jocelyn Hooper   NGE:[id number] DOB: [birth year]  DOA: 08/08/2023 Date of Service: 08/25/23 which is hospital day 17  PCP: Danella Penton, MD       Hospital course / significant events:  [phone] with h/o IDA/B12 deficiency scoliosis s/p intervention 06/09/2023 who presented on 12/3 with AMS.  She was septic with MRSA bacteremia related to discitis/osteomyelitis at L4-5.  TEE negative.  On Daptomycin and Ceftaroline. SNF placement pending  Consultants:  Infectious Disease Cardiology Palliative care   Procedures/Surgeries: TEE 11/27 Echocardiogram 11/28      ASSESSMENT & PLAN:   Sepsis due to MRSA bacteremia/L4-5 osteomyelitis/discitis Sepsis POA, as evidenced by fevers, tachycardia, tachypnea, leukocytosis - pt presented w/ severe left hip pain and is status post recent L5-S1 decompression, instrumentation and fusion on 06/09/2023.  Blood cultures +MRSA in all 4 bottles Initial MRI of the lumbar spine negative but repeat with discitis/osteomyelitis at L4-L5 TEE negative for endocarditis on 11/28 Based on persistent pain and minimal ability to participate in therapy, repeated lumbosacral MRI on 12/6 with no significant change from priorInitial repeat Bcx from 11/26 no growth & final Repeat blood cultures 11/29 again +MRSA  Repeat blood cultures again on 12/2 negative x 5 days S/p PICC placement  ID is following IV Daptomycin through 09/27/23 SNF rehab recommended - ongoing bed search   Toxic encephalopathy - resolved  Due to one time dose of Remeron 7.5 mg given to try to boost mood and appetite Medication was stopped and add to allergy list Negative head CT Symptoms resolved after cessation of Remeron monitor   Inadequate Pain Control  Goal of adequate pain control for mobility, pt was ambulating with walker just prior to admission Pain recurred without MS Contin (which has held d/t encephalopathy), so this was resumed Palliative care following   Increased gabapentin to 300 TID (was just on at bedtime) Consider addition of Cymbalta as an outpatient   Acute urinary retention Significant bladder distention noted on CT imaging with patient's inability to urinate afterwards   In-N-Out catheter with 600 cc Unclear etiology, possibly related to spinal infection Foley catheter placed 11/26 Added tamsulosin  Failed voiding trial, foley replaced 12/6 She will need outpatient urology f/u in 2-3 weeks   ASCVD (arteriosclerotic cardiovascular disease) Continue home aspirin and metoprolol   Hyponatremia Appears to be chronic, stable Will monitor periodically   Thrombocytosis Likely reactive Will continue to monitor   Ambulatory dysfunction Infection has led to significant weakness She is likely to benefit from SNF rehab Of note, her husband is also down with a bad back and cannot help with care for her at home   She is quite opposed to rehab but appears to be reluctantly agreeable Awaiting placement        Normal weight based on BMI: Body mass index is 21.09 kg/m.  Underweight - under 18.5  normal weight - 18.5 to 24.9 overweight - 25 to 29.9 obese - 30 or more   DVT prophylaxis: lovenox  IV fluids: no continuous IV fluids  Nutrition: regular diet Central lines / invasive devices: Foley, PICC  Code Status: FULL CODE  ACP documentation reviewed: none on file in [facility]  TOC needs: placement Barriers to dispo / significant pending items: placement              Subjective / Brief ROS:  Patient reports no concerns at this time  Denies CP/SOB.  Pain controlled.  Denies new weakness.  Tolerating diet.  Reports no concerns w/ urination/defecation.   Family Communication: family at bedside on rounds     Objective Findings:  Vitals:   08/25/23 0019 08/25/23 0348 08/25/23 0758 08/25/23 1233  BP: 112/67 120/67 102/63 (!) 91/52  Pulse: 75 86 75 69  Resp: 17 17    Temp: 97.9 F (36.6 C) 98.1 F (36.7  C) 97.7 F (36.5 C) 97.9 F (36.6 C)  TempSrc:   Oral Oral  SpO2: 98% 98% 97% 98%  Weight:      Height:        Intake/Output Summary (Last 24 hours) at 08/25/2023 1707 Last data filed at 08/25/2023 1500 Gross per 24 hour  Intake 120 ml  Output 925 ml  Net -805 ml   Filed Weights   08/09/23 0703 08/11/23 0722 08/23/23 0400  Weight: 56 kg 56 kg 57.5 kg    Examination:  Physical Exam Constitutional:      Appearance: Normal appearance.  Cardiovascular:     Rate and Rhythm: Normal rate and regular rhythm.  Pulmonary:     Effort: Pulmonary effort is normal.     Breath sounds: Normal breath sounds.  Abdominal:     General: Abdomen is flat.     Palpations: Abdomen is soft.  Musculoskeletal:     Right lower leg: No edema.     Left lower leg: No edema.  Skin:    General: Skin is warm and dry.  Neurological:     General: No focal deficit present.     Mental Status: She is alert.  Psychiatric:        Mood and Affect: Mood normal.        Behavior: Behavior normal.          Scheduled Medications:   acetaminophen  650 mg Oral Q6H   Or   acetaminophen  650 mg Rectal Q6H   aspirin EC  81 mg Oral Daily   Chlorhexidine Gluconate Cloth  6 each Topical Daily   enoxaparin (LOVENOX) injection  40 mg Subcutaneous Q24H   estradiol  0.5 mg Oral Daily   feeding supplement  237 mL Oral BID BM   fluticasone  1 spray Each Nare Daily   gabapentin  300 mg Oral TID   loratadine  10 mg Oral Daily   metoprolol succinate  25 mg Oral Daily   morphine  15 mg Oral Q12H   pantoprazole  40 mg Oral Daily   polyethylene glycol  17 g Oral Daily   senna-docusate  1 tablet Oral BID   sodium chloride flush  10-40 mL Intracatheter Q12H   sodium chloride flush  3 mL Intravenous Q12H   tamsulosin  0.4 mg Oral QPC supper   topiramate  150 mg Oral QHS    Continuous Infusions:  DAPTOmycin 500 mg (08/25/23 1328)    PRN Medications:  bisacodyl, fentaNYL (SUBLIMAZE) injection,  HYDROcodone-acetaminophen, magnesium hydroxide, melatonin, ondansetron **OR** ondansetron (ZOFRAN) IV, mouth rinse, sodium chloride flush  Antimicrobials from admission:  Anti-infectives (From admission, onward)    Start     Dose/Rate Route Frequency Ordered Stop   08/21/23 1400  DAPTOmycin (CUBICIN) 500 mg in sodium chloride 0.9 % IVPB  Status:  Discontinued        9 mg/kg  56 kg 120 mL/hr over 30 Minutes Intravenous Daily 08/21/23 0713 08/21/23 0719   08/21/23 1400  DAPTOmycin (CUBICIN) IVPB 500 mg/76mL premix        9 mg/kg  56 kg 100 mL/hr over 30 Minutes Intravenous  Daily 08/21/23 0719     08/16/23 1500  ceftaroline (TEFLARO) 600 mg in sodium chloride 0.9 % 100 mL IVPB  Status:  Discontinued        600 mg 100 mL/hr over 60 Minutes Intravenous Every 8 hours 08/16/23 1354 08/23/23 1222   08/16/23 1445  DAPTOmycin (CUBICIN) IVPB 500 mg/71mL premix  Status:  Discontinued        9 mg/kg  56 kg 100 mL/hr over 30 Minutes Intravenous Daily 08/16/23 1359 08/21/23 0712   08/13/23 2200  cefTRIAXone (ROCEPHIN) 2 g in sodium chloride 0.9 % 100 mL IVPB  Status:  Discontinued        2 g 200 mL/hr over 30 Minutes Intravenous Every 24 hours 08/13/23 2108 08/16/23 0902   08/12/23 2200  vancomycin (VANCOREADY) IVPB 500 mg/100 mL  Status:  Discontinued        500 mg 100 mL/hr over 60 Minutes Intravenous Every 12 hours 08/12/23 2030 08/12/23 2049   08/12/23 2200  vancomycin (VANCOREADY) IVPB 1250 mg/250 mL  Status:  Discontinued        1,250 mg 166.7 mL/hr over 90 Minutes Intravenous Every 24 hours 08/12/23 2049 08/16/23 1359   08/10/23 0100  vancomycin (VANCOCIN) IVPB 1000 mg/200 mL premix  Status:  Discontinued        1,000 mg 200 mL/hr over 60 Minutes Intravenous Every 24 hours 08/09/23 0108 08/09/23 1520   08/09/23 2200  vancomycin (VANCOREADY) IVPB 750 mg/150 mL  Status:  Discontinued        750 mg 150 mL/hr over 60 Minutes Intravenous Every 24 hours 08/09/23 1520 08/12/23 2030   08/09/23  0100  vancomycin (VANCOREADY) IVPB 1250 mg/250 mL        1,250 mg 166.7 mL/hr over 90 Minutes Intravenous  Once 08/09/23 0058 08/09/23 0700   08/08/23 1145  cefTRIAXone (ROCEPHIN) 2 g in sodium chloride 0.9 % 100 mL IVPB  Status:  Discontinued        2 g 200 mL/hr over 30 Minutes Intravenous Every 24 hours 08/08/23 1139 08/09/23 0104           Data Reviewed:  I have personally reviewed the following...  CBC: Recent Labs  Lab 08/21/23 0522 08/22/23 0630 08/23/23 0609 08/24/23 0442 08/25/23 0502  WBC 13.5* 12.1* 9.3 9.3 9.5  NEUTROABS 11.2* 9.9* 7.1 6.7 6.9  HGB 8.7* 8.5* 8.5* 8.7* 8.5*  HCT 26.9* 25.8* 25.7* 27.7* 26.6*  MCV 95.4 93.1 94.1 97.2 95.7  PLT 582* 591* 587* 573* 548*   Basic Metabolic Panel: Recent Labs  Lab 08/21/23 0522 08/22/23 0630 08/23/23 0609 08/24/23 0442 08/25/23 0502  NA 132* 130* 134* 131* 134*  K 3.8 3.8 3.8 3.7 3.5  CL 105 101 108 102 105  CO2 20* 21* 18* 22 21*  GLUCOSE 92 104* 98 111* 108*  BUN 15 18 18 18 16   CREATININE 0.73 0.82 0.75 0.73 0.76  CALCIUM 8.5* 8.3* 8.5* 8.5* 8.5*   GFR: Estimated Creatinine Clearance: 47.1 mL/min (by C-G formula based on SCr of 0.76 mg/dL). Liver Function Tests: Recent Labs  Lab 08/23/23 0609  AST 19  ALT 23  ALKPHOS 255*  BILITOT 0.3  PROT 5.5*  ALBUMIN 2.0*   No results for input(s): "LIPASE", "AMYLASE" in the last 168 hours. Recent Labs  Lab 08/23/23 1511  AMMONIA 26   Coagulation Profile: No results for input(s): "INR", "PROTIME" in the last 168 hours. Cardiac Enzymes: Recent Labs  Lab 08/23/23 0609  CKTOTAL 20*  BNP (last 3 results) No results for input(s): "PROBNP" in the last 8760 hours. HbA1C: No results for input(s): "HGBA1C" in the last 72 hours. CBG: Recent Labs  Lab 08/22/23 0148  GLUCAP 99   Lipid Profile: No results for input(s): "CHOL", "HDL", "LDLCALC", "TRIG", "CHOLHDL", "LDLDIRECT" in the last 72 hours. Thyroid Function Tests: No results for input(s):  "TSH", "T4TOTAL", "FREET4", "T3FREE", "THYROIDAB" in the last 72 hours. Anemia Panel: No results for input(s): "VITAMINB12", "FOLATE", "FERRITIN", "TIBC", "IRON", "RETICCTPCT" in the last 72 hours. Most Recent Urinalysis On File:     Component Value Date/Time   COLORURINE YELLOW (A) 08/08/2023 1614   APPEARANCEUR HAZY (A) 08/08/2023 1614   APPEARANCEUR Hazy (A) 04/27/2023 1340   LABSPEC 1.016 08/08/2023 1614   LABSPEC 1.015 08/29/2012 0945   PHURINE 8.0 08/08/2023 1614   GLUCOSEU NEGATIVE 08/08/2023 1614   GLUCOSEU Negative 08/29/2012 0945   HGBUR NEGATIVE 08/08/2023 1614   BILIRUBINUR NEGATIVE 08/08/2023 1614   BILIRUBINUR Negative 04/27/2023 1340   BILIRUBINUR Negative 08/29/2012 0945   KETONESUR 20 (A) 08/08/2023 1614   PROTEINUR NEGATIVE 08/08/2023 1614   NITRITE NEGATIVE 08/08/2023 1614   LEUKOCYTESUR NEGATIVE 08/08/2023 1614   LEUKOCYTESUR Negative 08/29/2012 0945   Sepsis Labs: @LABRCNTIP (procalcitonin:4,lacticidven:4) Microbiology: Recent Results (from the past 240 hour(s))  Culture, blood (Routine X 2) w Reflex to ID Panel     Status: None   Collection Time: 08/16/23  4:32 AM   Specimen: BLOOD RIGHT ARM  Result Value Ref Range Status   Specimen Description BLOOD RIGHT ARM  Final   Special Requests   Final    BOTTLES DRAWN AEROBIC AND ANAEROBIC Blood Culture adequate volume   Culture   Final    NO GROWTH 5 DAYS Performed at [facility], [address] [place], [place] [address]    Report Status 08/21/2023 FINAL  Final  Culture, blood (Routine X 2) w Reflex to ID Panel     Status: None   Collection Time: 08/16/23  4:41 AM   Specimen: BLOOD LEFT HAND  Result Value Ref Range Status   Specimen Description BLOOD LEFT HAND  Final   Special Requests   Final    BOTTLES DRAWN AEROBIC AND ANAEROBIC Blood Culture adequate volume   Culture   Final    NO GROWTH 5 DAYS Performed at [facility], [address], [place], [place] [address]    Report  Status 08/21/2023 FINAL  Final      Radiology Studies last 3 days: CT HEAD WO CONTRAST ( )  Result Date: 08/23/2023 CLINICAL DATA:  Provided history: Mental status change, unknown cause. Sepsis with encephalopathy without septic shock. EXAM: CT HEAD WITHOUT CONTRAST TECHNIQUE: Contiguous axial images were obtained from the base of the skull through the vertex without intravenous contrast. RADIATION DOSE REDUCTION: This exam was performed according to the departmental dose-optimization program which includes automated exposure control, adjustment of the mA and/or kV according to patient size and/or use of iterative reconstruction technique. COMPARISON:  Brain MRI 08/13/2023.  Head CT 08/08/2023. FINDINGS: Brain: Generalized cerebral atrophy. Patchy and ill-defined hypoattenuation within the cerebral white matter, nonspecific but compatible with moderate chronic small vessel ischemic disease. There is no acute intracranial hemorrhage. No demarcated cortical infarct. No extra-axial fluid collection. No evidence of an intracranial mass. No midline shift. Vascular: No hyperdense vessel.  Atherosclerotic calcifications. Skull: No calvarial fracture or aggressive osseous lesion. Sinuses/Orbits: No mass or acute finding within the imaged orbits. Postsurgical appearance of the paranasal sinuses. Mucosal thickening  within the right greater than left maxillary sinuses (with associated prominent chronic provided reactive osteitis). IMPRESSION: 1.  No evidence of an acute intracranial abnormality. 2. Parenchymal atrophy and chronic small vessel ischemic disease. Electronically Signed   By: Jackey Loge D.O.   On: 08/23/2023 14:45   [place] EKG SITE RITE  Result Date: 08/22/2023 If Site Rite image not attached, placement could not be confirmed due to current cardiac rhythm.       Sunnie Nielsen, DO [facility] 08/25/2023, 5:07 PM    Dictation software may have been used to generate the above  note. Typos may occur and escape review in typed/dictated notes. Please contact Dr Lyn Hollingshead directly for clarity if needed.  Staff may message me via secure chat in Epic  but this may not receive an immediate response,  please page me for urgent matters!  If 7PM-7AM, please contact night coverage [email or web address]

## 2023-08-25 NOTE — Treatment Plan (Addendum)
Diagnosis: MRSA bacteremia Lumbar discitis l4-l5  Cr 0.76    Allergies  Allergen Reactions   Avelox [Moxifloxacin Hcl In Nacl] Other (See Comments)    Redness all over in about 5 minutes after taking   Codeine Nausea Only   Acyclovir And Related     Turn red all over    Mirtazapine Itching    Becomes twitchy and myoclonic   Shrimp [Shellfish Allergy]     Hives     Ciprofloxacin Rash   Moxifloxacin Hives and Rash   Sucralfate Rash    Itching     OPAT Orders Discharge antibiotics: Daptomycin 500mg  IV every 24 hrs For 6 weeks starting date 08/16/23 End Date: 09/27/23  [facility] Care Per Protocol:  Labs weekly while on IV antibiotics: _X_ CBC with differential X CK _X_ CMP _X_ CRP X__ ESR  WATCH for Rhabdomyolysis, eosinophilia, pneumonia while on Daptomycin  _X_ Please pull PIC at completion of IV antibiotics   Fax weekly lab results  promptly to [phone]  Clinic Follow Up Appt: 09/21/23 at 10.45 AM   Call [phone] with abnormal labs or any other questions

## 2023-08-25 NOTE — Consult Note (Signed)
PHARMACY CONSULT NOTE FOR:  OUTPATIENT  PARENTERAL ANTIBIOTIC THERAPY (OPAT)  Indication: MRSA bacteremia, Lumbar discitis L4-L5 Regimen: Daptomycin 500mg  IV every 24 hrs  End date: 09/27/2023  IV antibiotic discharge orders are pended. To discharging provider:  please sign these orders via discharge navigator,  Select New Orders & click on the button choice - Manage This Unsigned Work.     Thank you for allowing pharmacy to be a part of this patient's care.  Nhyira Leano A Sapphira Harjo 08/25/2023, 9:03 PM

## 2023-08-26 DIAGNOSIS — A4102 Sepsis due to Methicillin resistant Staphylococcus aureus: Secondary | ICD-10-CM | POA: Diagnosis not present

## 2023-08-26 DIAGNOSIS — R6520 Severe sepsis without septic shock: Secondary | ICD-10-CM | POA: Diagnosis not present

## 2023-08-26 DIAGNOSIS — G9341 Metabolic encephalopathy: Secondary | ICD-10-CM | POA: Diagnosis not present

## 2023-08-26 MED ORDER — POLYETHYLENE GLYCOL 3350 17 G PO PACK
34.0000 g | PACK | Freq: Every day | ORAL | Status: DC
  Administered 2023-08-27: 34 g via ORAL
  Filled 2023-08-26: qty 2

## 2023-08-26 MED ORDER — NALOXEGOL OXALATE 12.5 MG PO TABS
12.5000 mg | ORAL_TABLET | Freq: Every day | ORAL | Status: DC
  Administered 2023-08-26 – 2023-08-27 (×2): 12.5 mg via ORAL
  Filled 2023-08-26 (×2): qty 1

## 2023-08-26 MED ORDER — GLYCERIN (LAXATIVE) 2 G RE SUPP: 1.0000 | Freq: Every day | RECTAL | Status: DC | PRN

## 2023-08-26 MED ORDER — FLEET ENEMA RE ENEM
1.0000 | ENEMA | Freq: Every day | RECTAL | Status: DC | PRN
Start: 2023-08-26 — End: 2023-08-30
  Administered 2023-08-26: 1 via RECTAL

## 2023-08-26 NOTE — Progress Notes (Signed)
PROGRESS NOTE    Jocelyn Hooper   HKV:[id number] DOB: Jan 20, 1941  DOA: 08/08/2023 Date of Service: 08/26/23 which is hospital day 18  PCP: Danella Penton, MD       Hospital course / significant events:  [phone] with h/o IDA/B12 deficiency scoliosis s/p intervention 06/09/2023 who presented on 12/3 with AMS.  She was septic with MRSA bacteremia related to discitis/osteomyelitis at L4-5.  TEE negative.  On Daptomycin and Ceftaroline. SNF placement pending  Consultants:  Infectious Disease Cardiology Palliative care   Procedures/Surgeries: TEE 11/27 Echocardiogram 11/28      ASSESSMENT & PLAN:   Sepsis due to MRSA bacteremia/L4-5 osteomyelitis/discitis Sepsis POA, as evidenced by fevers, tachycardia, tachypnea, leukocytosis - pt presented w/ severe left hip pain and is status post recent L5-S1 decompression, instrumentation and fusion on 06/09/2023.  Blood cultures +MRSA in all 4 bottles Initial MRI of the lumbar spine negative but repeat with discitis/osteomyelitis at L4-L5 TEE negative for endocarditis on 11/28 Based on persistent pain and minimal ability to participate in therapy, repeated lumbosacral MRI on 12/6 with no significant change from priorInitial repeat Bcx from 11/26 no growth & final Repeat blood cultures 11/29 again +MRSA  Repeat blood cultures again on 12/2 negative x 5 days S/p PICC placement  ID is following IV Daptomycin through 09/27/23 SNF rehab recommended - pending auth   Toxic encephalopathy - resolved  Due to one time dose of Remeron 7.5 mg given to try to boost mood and appetite Medication was stopped and add to allergy list Negative head CT Symptoms resolved after cessation of Remeron monitor   Inadequate Pain Control  Goal of adequate pain control for mobility, pt was ambulating with walker just prior to admission Pain recurred without MS Contin (which has held d/t encephalopathy), so this was resumed Palliative care following  Increased  gabapentin to 300 TID (was just on at bedtime) Consider addition of Cymbalta as an outpatient   Constipation likely opiate induced Movantik initiated  Continue stool softeners/laxatives   Acute urinary retention Significant bladder distention noted on CT imaging with patient's inability to urinate afterwards   In-N-Out catheter with 600 cc Unclear etiology, possibly related to spinal infection Foley catheter placed 11/26 Added tamsulosin  Failed voiding trial, foley replaced 12/6 She will need outpatient urology f/u in 2-3 weeks   ASCVD (arteriosclerotic cardiovascular disease) Continue home aspirin and metoprolol   Hyponatremia Appears to be chronic, stable Will monitor periodically   Thrombocytosis Likely reactive Will continue to monitor   Ambulatory dysfunction Infection has led to significant weakness She is likely to benefit from SNF rehab Of note, her husband is also down with a bad back and cannot help with care for her at home   She is quite opposed to rehab but appears to be reluctantly agreeable Awaiting placement        Normal weight based on BMI: Body mass index is 21.09 kg/m.  Underweight - under 18.5  normal weight - 18.5 to 24.9 overweight - 25 to 29.9 obese - 30 or more   DVT prophylaxis: lovenox  IV fluids: no continuous IV fluids  Nutrition: regular diet Central lines / invasive devices: Foley, PICC  Code Status: FULL CODE  ACP documentation reviewed: none on file in [facility]  TOC needs: placement Barriers to dispo / significant pending items: placement - auth is pending              Subjective / Brief ROS:  Patient reports no concerns at this  time  Denies CP/SOB.  Pain controlled.   Family Communication: none at this time    Objective Findings:  Vitals:   08/26/23 0026 08/26/23 0325 08/26/23 0943 08/26/23 1337  BP: (!) 91/52 96/61 104/64 (!) 107/56  Pulse: 76 70 71 72  Resp: 18 18 18 18   Temp: 98.4 F (36.9 C) 98.2  F (36.8 C) 97.9 F (36.6 C) 98.1 F (36.7 C)  TempSrc: Oral Axillary Oral   SpO2: 97% 95% 97% 99%  Weight:      Height:        Intake/Output Summary (Last 24 hours) at 08/26/2023 1623 Last data filed at 08/26/2023 1409 Gross per 24 hour  Intake 320 ml  Output 1050 ml  Net -730 ml   Filed Weights   08/09/23 0703 08/11/23 0722 08/23/23 0400  Weight: 56 kg 56 kg 57.5 kg    Examination:  Physical Exam Constitutional:      Appearance: Normal appearance.  Cardiovascular:     Rate and Rhythm: Normal rate and regular rhythm.  Pulmonary:     Effort: Pulmonary effort is normal.     Breath sounds: Normal breath sounds.  Abdominal:     General: Abdomen is flat.     Palpations: Abdomen is soft.  Musculoskeletal:     Right lower leg: No edema.     Left lower leg: No edema.  Skin:    General: Skin is warm and dry.  Neurological:     General: No focal deficit present.     Mental Status: She is alert.  Psychiatric:        Mood and Affect: Mood normal.        Behavior: Behavior normal.          Scheduled Medications:   acetaminophen  650 mg Oral Q6H   Or   acetaminophen  650 mg Rectal Q6H   aspirin EC  81 mg Oral Daily   Chlorhexidine Gluconate Cloth  6 each Topical Daily   enoxaparin (LOVENOX) injection  40 mg Subcutaneous Q24H   estradiol  0.5 mg Oral Daily   feeding supplement  237 mL Oral BID BM   fluticasone  1 spray Each Nare Daily   gabapentin  300 mg Oral TID   loratadine  10 mg Oral Daily   metoprolol succinate  25 mg Oral Daily   morphine  15 mg Oral Q12H   naloxegol oxalate  12.5 mg Oral Daily   pantoprazole  40 mg Oral Daily   [START ON 08/27/2023] polyethylene glycol  34 g Oral Daily   senna-docusate  1 tablet Oral BID   sodium chloride flush  10-40 mL Intracatheter Q12H   sodium chloride flush  3 mL Intravenous Q12H   tamsulosin  0.4 mg Oral QPC supper   topiramate  150 mg Oral QHS    Continuous Infusions:  DAPTOmycin 500 mg (08/26/23 1409)     PRN Medications:  bisacodyl, fentaNYL (SUBLIMAZE) injection, Glycerin (Adult), HYDROcodone-acetaminophen, melatonin, ondansetron **OR** ondansetron (ZOFRAN) IV, mouth rinse, sodium chloride flush, sodium phosphate  Antimicrobials from admission:  Anti-infectives (From admission, onward)    Start     Dose/Rate Route Frequency Ordered Stop   08/21/23 1400  DAPTOmycin (CUBICIN) 500 mg in sodium chloride 0.9 % IVPB  Status:  Discontinued        9 mg/kg  56 kg 120 mL/hr over 30 Minutes Intravenous Daily 08/21/23 0713 08/21/23 0719   08/21/23 1400  DAPTOmycin (CUBICIN) IVPB 500 mg/69mL premix  9 mg/kg  56 kg 100 mL/hr over 30 Minutes Intravenous Daily 08/21/23 0719     08/16/23 1500  ceftaroline (TEFLARO) 600 mg in sodium chloride 0.9 % 100 mL IVPB  Status:  Discontinued        600 mg 100 mL/hr over 60 Minutes Intravenous Every 8 hours 08/16/23 1354 08/23/23 1222   08/16/23 1445  DAPTOmycin (CUBICIN) IVPB 500 mg/6mL premix  Status:  Discontinued        9 mg/kg  56 kg 100 mL/hr over 30 Minutes Intravenous Daily 08/16/23 1359 08/21/23 0712   08/13/23 2200  cefTRIAXone (ROCEPHIN) 2 g in sodium chloride 0.9 % 100 mL IVPB  Status:  Discontinued        2 g 200 mL/hr over 30 Minutes Intravenous Every 24 hours 08/13/23 2108 08/16/23 0902   08/12/23 2200  vancomycin (VANCOREADY) IVPB 500 mg/100 mL  Status:  Discontinued        500 mg 100 mL/hr over 60 Minutes Intravenous Every 12 hours 08/12/23 2030 08/12/23 2049   08/12/23 2200  vancomycin (VANCOREADY) IVPB 1250 mg/250 mL  Status:  Discontinued        1,250 mg 166.7 mL/hr over 90 Minutes Intravenous Every 24 hours 08/12/23 2049 08/16/23 1359   08/10/23 0100  vancomycin (VANCOCIN) IVPB 1000 mg/200 mL premix  Status:  Discontinued        1,000 mg 200 mL/hr over 60 Minutes Intravenous Every 24 hours 08/09/23 0108 08/09/23 1520   08/09/23 2200  vancomycin (VANCOREADY) IVPB 750 mg/150 mL  Status:  Discontinued        750 mg 150 mL/hr  over 60 Minutes Intravenous Every 24 hours 08/09/23 1520 08/12/23 2030   08/09/23 0100  vancomycin (VANCOREADY) IVPB 1250 mg/250 mL        1,250 mg 166.7 mL/hr over 90 Minutes Intravenous  Once 08/09/23 0058 08/09/23 0700   08/08/23 1145  cefTRIAXone (ROCEPHIN) 2 g in sodium chloride 0.9 % 100 mL IVPB  Status:  Discontinued        2 g 200 mL/hr over 30 Minutes Intravenous Every 24 hours 08/08/23 1139 08/09/23 0104           Data Reviewed:  I have personally reviewed the following...  CBC: Recent Labs  Lab 08/21/23 0522 08/22/23 0630 08/23/23 0609 08/24/23 0442 08/25/23 0502  WBC 13.5* 12.1* 9.3 9.3 9.5  NEUTROABS 11.2* 9.9* 7.1 6.7 6.9  HGB 8.7* 8.5* 8.5* 8.7* 8.5*  HCT 26.9* 25.8* 25.7* 27.7* 26.6*  MCV 95.4 93.1 94.1 97.2 95.7  PLT 582* 591* 587* 573* 548*   Basic Metabolic Panel: Recent Labs  Lab 08/21/23 0522 08/22/23 0630 08/23/23 0609 08/24/23 0442 08/25/23 0502  NA 132* 130* 134* 131* 134*  K 3.8 3.8 3.8 3.7 3.5  CL 105 101 108 102 105  CO2 20* 21* 18* 22 21*  GLUCOSE 92 104* 98 111* 108*  BUN 15 18 18 18 16   CREATININE 0.73 0.82 0.75 0.73 0.76  CALCIUM 8.5* 8.3* 8.5* 8.5* 8.5*   GFR: Estimated Creatinine Clearance: 47.1 mL/min (by C-G formula based on SCr of 0.76 mg/dL). Liver Function Tests: Recent Labs  Lab 08/23/23 0609  AST 19  ALT 23  ALKPHOS 255*  BILITOT 0.3  PROT 5.5*  ALBUMIN 2.0*   No results for input(s): "LIPASE", "AMYLASE" in the last 168 hours. Recent Labs  Lab 08/23/23 1511  AMMONIA 26   Coagulation Profile: No results for input(s): "INR", "PROTIME" in the last 168 hours. Cardiac Enzymes:  Recent Labs  Lab 08/23/23 0609  CKTOTAL 20*   BNP (last 3 results) No results for input(s): "PROBNP" in the last 8760 hours. HbA1C: No results for input(s): "HGBA1C" in the last 72 hours. CBG: Recent Labs  Lab 08/22/23 0148  GLUCAP 99   Lipid Profile: No results for input(s): "CHOL", "HDL", "LDLCALC", "TRIG", "CHOLHDL",  "LDLDIRECT" in the last 72 hours. Thyroid Function Tests: No results for input(s): "TSH", "T4TOTAL", "FREET4", "T3FREE", "THYROIDAB" in the last 72 hours. Anemia Panel: No results for input(s): "VITAMINB12", "FOLATE", "FERRITIN", "TIBC", "IRON", "RETICCTPCT" in the last 72 hours. Most Recent Urinalysis On File:     Component Value Date/Time   COLORURINE YELLOW (A) 08/08/2023 1614   APPEARANCEUR HAZY (A) 08/08/2023 1614   APPEARANCEUR Hazy (A) 04/27/2023 1340   LABSPEC 1.016 08/08/2023 1614   LABSPEC 1.015 08/29/2012 0945   PHURINE 8.0 08/08/2023 1614   GLUCOSEU NEGATIVE 08/08/2023 1614   GLUCOSEU Negative 08/29/2012 0945   HGBUR NEGATIVE 08/08/2023 1614   BILIRUBINUR NEGATIVE 08/08/2023 1614   BILIRUBINUR Negative 04/27/2023 1340   BILIRUBINUR Negative 08/29/2012 0945   KETONESUR 20 (A) 08/08/2023 1614   PROTEINUR NEGATIVE 08/08/2023 1614   NITRITE NEGATIVE 08/08/2023 1614   LEUKOCYTESUR NEGATIVE 08/08/2023 1614   LEUKOCYTESUR Negative 08/29/2012 0945   Sepsis Labs: @LABRCNTIP (procalcitonin:4,lacticidven:4) Microbiology: No results found for this or any previous visit (from the past 240 hours).     Radiology Studies last 3 days: CT HEAD WO CONTRAST ( ) Result Date: 08/23/2023 CLINICAL DATA:  Provided history: Mental status change, unknown cause. Sepsis with encephalopathy without septic shock. EXAM: CT HEAD WITHOUT CONTRAST TECHNIQUE: Contiguous axial images were obtained from the base of the skull through the vertex without intravenous contrast. RADIATION DOSE REDUCTION: This exam was performed according to the departmental dose-optimization program which includes automated exposure control, adjustment of the mA and/or kV according to patient size and/or use of iterative reconstruction technique. COMPARISON:  Brain MRI 08/13/2023.  Head CT 08/08/2023. FINDINGS: Brain: Generalized cerebral atrophy. Patchy and ill-defined hypoattenuation within the cerebral white matter,  nonspecific but compatible with moderate chronic small vessel ischemic disease. There is no acute intracranial hemorrhage. No demarcated cortical infarct. No extra-axial fluid collection. No evidence of an intracranial mass. No midline shift. Vascular: No hyperdense vessel.  Atherosclerotic calcifications. Skull: No calvarial fracture or aggressive osseous lesion. Sinuses/Orbits: No mass or acute finding within the imaged orbits. Postsurgical appearance of the paranasal sinuses. Mucosal thickening within the right greater than left maxillary sinuses (with associated prominent chronic provided reactive osteitis). IMPRESSION: 1.  No evidence of an acute intracranial abnormality. 2. Parenchymal atrophy and chronic small vessel ischemic disease. Electronically Signed   By: Jackey Loge D.O.   On: 08/23/2023 14:45        Sunnie Nielsen, DO [facility] 08/26/2023, 4:23 PM    Dictation software may have been used to generate the above note. Typos may occur and escape review in typed/dictated notes. Please contact Dr Lyn Hollingshead directly for clarity if needed.  Staff may message me via secure chat in Epic  but this may not receive an immediate response,  please page me for urgent matters!  If 7PM-7AM, please contact night coverage [email or web address]

## 2023-08-26 NOTE — TOC Progression Note (Addendum)
Transition of Care [facility]) - Progression Note    Patient Details  Name: Jocelyn Hooper MRN: [id number] Date of Birth: 1941/02/27  Transition of Care [facility]) CM/SW Contact  Truddie Hidden, RN Phone Number: 08/26/2023, 2:48 PM  Clinical Narrative:    Spoke with patient's daughter, [name] regarding bed offer.  [facility] agreeable to bed offer. She was advised [facility] Harvey would be required.   [facility] contacted to start auth. No answer. Left a message for Tammy.   3:11am Spoke with Tammy from [facility]. Auth started        Expected Discharge Plan: Home w Home Health Services Barriers to Discharge: Continued Medical Work up  Expected Discharge Plan and Services                                               Social Determinants of Health (SDOH) Interventions SDOH Screenings   Food Insecurity: No Food Insecurity (08/09/2023)  Housing: Low Risk  (08/26/2023)  Transportation Needs: No Transportation Needs (08/09/2023)  Utilities: Not At Risk (08/09/2023)  Financial Resource Strain: Low Risk  (07/01/2023)   Received from [facility]  Tobacco Use: Low Risk  (08/08/2023)    Readmission Risk Interventions     No data to display

## 2023-08-26 NOTE — Plan of Care (Signed)

## 2023-08-26 NOTE — TOC Progression Note (Signed)
Transition of Care [facility]) - Progression Note    Patient Details  Name: Jocelyn Hooper MRN: [id number] Date of Birth: 03-01-1941  Transition [facility]) CM/SW Contact  Truddie Hidden, RN Phone Number: 08/26/2023, 3:47 PM  Clinical Narrative:    Sherron Monday with Ricky from [facility] to advise patient will be discharging with Daptomyacin. Per Davis patient can still admit.     Expected Discharge Plan: Home w Home Health Services Barriers to Discharge: Continued Medical Work up  Expected Discharge Plan and Services                                               Social Determinants of Health (SDOH) Interventions SDOH Screenings   Food Insecurity: No Food Insecurity (08/09/2023)  Housing: Low Risk  (08/26/2023)  Transportation Needs: No Transportation Needs (08/09/2023)  Utilities: Not At Risk (08/09/2023)  Financial Resource Strain: Low Risk  (07/01/2023)   Received from [facility]  Tobacco Use: Low Risk  (08/08/2023)    Readmission Risk Interventions     No data to display

## 2023-08-26 NOTE — Care Management Important Message (Signed)
Important Message  Patient Details  Name: Jocelyn Hooper MRN: [id number] Date of Birth: Aug 14, 1941   Important Message Given:  Yes - Medicare IM  I reviewed the Important Message from Medicare with the patient's daughter, [name] [phone] due to her mother's AMS. She stated she understood these rights and was in agreement with the upcoming discharge and I thanked her for time.   [name] 08/26/2023, 1:57 PM

## 2023-08-27 DIAGNOSIS — B9562 Methicillin resistant Staphylococcus aureus infection as the cause of diseases classified elsewhere: Secondary | ICD-10-CM | POA: Diagnosis not present

## 2023-08-27 DIAGNOSIS — R6520 Severe sepsis without septic shock: Secondary | ICD-10-CM | POA: Diagnosis not present

## 2023-08-27 DIAGNOSIS — M4646 Discitis, unspecified, lumbar region: Secondary | ICD-10-CM | POA: Diagnosis not present

## 2023-08-27 DIAGNOSIS — G9341 Metabolic encephalopathy: Secondary | ICD-10-CM | POA: Diagnosis not present

## 2023-08-27 DIAGNOSIS — A4102 Sepsis due to Methicillin resistant Staphylococcus aureus: Secondary | ICD-10-CM | POA: Diagnosis not present

## 2023-08-27 DIAGNOSIS — R7881 Bacteremia: Secondary | ICD-10-CM | POA: Diagnosis not present

## 2023-08-27 DIAGNOSIS — M4626 Osteomyelitis of vertebra, lumbar region: Secondary | ICD-10-CM | POA: Diagnosis not present

## 2023-08-27 LAB — C-REACTIVE PROTEIN: CRP: 3.4 mg/dL — ABNORMAL HIGH (ref <1.0)

## 2023-08-27 LAB — SEDIMENTATION RATE: Sed Rate: 127 mm/h — ABNORMAL HIGH (ref 0–30)

## 2023-08-27 MED ORDER — MAGNESIUM HYDROXIDE 400 MG/5ML PO SUSP
15.0000 mL | Freq: Once | ORAL | Status: AC
  Administered 2023-08-27: 15 mL via ORAL
  Filled 2023-08-27: qty 30

## 2023-08-27 MED ORDER — POLYETHYLENE GLYCOL 3350 17 G PO PACK
17.0000 g | PACK | Freq: Two times a day (BID) | ORAL | Status: DC
  Administered 2023-08-28 – 2023-08-30 (×2): 17 g via ORAL
  Filled 2023-08-27 (×2): qty 1

## 2023-08-27 MED ORDER — NALOXEGOL OXALATE 25 MG PO TABS
25.0000 mg | ORAL_TABLET | Freq: Every day | ORAL | Status: DC
  Administered 2023-08-28 – 2023-08-30 (×3): 25 mg via ORAL
  Filled 2023-08-27 (×3): qty 1

## 2023-08-27 MED ORDER — GLYCERIN (LAXATIVE) 2 G RE SUPP
1.0000 | Freq: Once | RECTAL | Status: DC
  Filled 2023-08-27: qty 1

## 2023-08-27 NOTE — Progress Notes (Signed)
Date of Admission:  08/08/2023     ID: Jocelyn Hooper is a 82 y.o. female  Principal Problem:   Sepsis with encephalopathy without septic shock (HCC) Active Problems:   Mixed hyperlipidemia   GERD (gastroesophageal reflux disease)   ASCVD (arteriosclerotic cardiovascular disease)   B12 deficiency   Trigeminal neuralgia syndrome   Spondylolisthesis of lumbosacral region   Acute urinary retention   Altered mental status   MRSA bacteremia   Discitis of lumbar region   Inadequate pain control   Myoclonus  On 06/09/23 underwent L5-S1 decompression with fusion at [facility] B/l TKA ( Left 2013, Rt 2019)  Subjective: Doing better Had bowel movt   Medications:   acetaminophen  650 mg Oral Q6H   Or   acetaminophen  650 mg Rectal Q6H   aspirin EC  81 mg Oral Daily   Chlorhexidine Gluconate Cloth  6 each Topical Daily   enoxaparin (LOVENOX) injection  40 mg Subcutaneous Q24H   estradiol  0.5 mg Oral Daily   feeding supplement  237 mL Oral BID BM   fluticasone  1 spray Each Nare Daily   gabapentin  300 mg Oral TID   loratadine  10 mg Oral Daily   metoprolol succinate  25 mg Oral Daily   morphine  15 mg Oral Q12H   naloxegol oxalate  12.5 mg Oral Daily   pantoprazole  40 mg Oral Daily   polyethylene glycol  34 g Oral Daily   senna-docusate  1 tablet Oral BID   sodium chloride flush  10-40 mL Intracatheter Q12H   sodium chloride flush  3 mL Intravenous Q12H   tamsulosin  0.4 mg Oral QPC supper   topiramate  150 mg Oral QHS    Objective: Vital signs in last 24 hours: Patient Vitals for the past 24 hrs:  BP Temp Temp src Pulse Resp SpO2  08/27/23 1247 (!) 111/59 98.3 F (36.8 C) -- 94 16 100 %  08/27/23 0811 (!) 108/56 98.3 F (36.8 C) -- 65 16 96 %  08/27/23 0350 (!) 106/57 98.4 F (36.9 C) Oral 64 19 98 %  08/26/23 2334 121/67 98.4 F (36.9 C) Oral 86 -- 99 %  08/26/23 1958 (!) 97/54 98.4 F (36.9 C) -- 72 18 97 %     LDA Foley   PHYSICAL EXAM:   General:alert Lungs: Clear to auscultation bilaterally. No Wheezing or Rhonchi. No rales. Heart: Regular rate and rhythm, no murmur, rub or gallop. Abdomen: Soft, non-tender,not distended. Bowel sounds normal. No masses Extremities: atraumatic, no cyanosis. No edema. No clubbing B/l knee surgical scars foley Skin: No rashes or lesions. Or bruising Lymph: Cervical, supraclavicular normal. Neurologic: non  focal  Lab Results    Latest Ref Rng & Units 08/25/2023    5:02 AM 08/24/2023    4:42 AM 08/23/2023    6:09 AM  CBC  WBC 4.0 - 10.5 K/uL 9.5  9.3  9.3   Hemoglobin 12.0 - 15.0 g/dL 8.5  8.7  8.5   Hematocrit 36.0 - 46.0 % 26.6  27.7  25.7   Platelets 150 - 400 K/uL 548  573  587        Latest Ref Rng & Units 08/25/2023    5:02 AM 08/24/2023    4:42 AM 08/23/2023    6:09 AM  CMP  Glucose 70 - 99 mg/dL 259  563  98   BUN 8 - 23 mg/dL 16  18  18    Creatinine 0.44 -  1.00 mg/dL 1.61  0.96  0.45   Sodium 135 - 145 mmol/L 134  131  134   Potassium 3.5 - 5.1 mmol/L 3.5  3.7  3.8   Chloride 98 - 111 mmol/L 105  102  108   CO2 22 - 32 mmol/L 21  22  18    Calcium 8.9 - 10.3 mg/dL 8.5  8.5  8.5   Total Protein 6.5 - 8.1 g/dL   5.5   Total Bilirubin <1.2 mg/dL   0.3   Alkaline Phos 38 - 126 U/L   255   AST 15 - 41 U/L   19   ALT 0 - 44 U/L   23       Microbiology: 08/08/23- 4/4 MRSA bacteremia 08/10/23-BC - ng 11/29 BC + mRSA 12/2 Bc    Assessment/Plan:   MRSA bacteremia  Recent spine fusion 06/09/23 underwent L5-S1 laminotomy, facectomy and posterior lumbar interbody fusion High bio-burden Now has  discitis/osteo a L4-L5 area Edematous paraspinal soft tissue at L5 Partial collapse of L5 vertebral body TEE no endocarditis Pt was on dual  dapto and ceftaroline (Dual MRSA covergae) since 12/2 because of persistent fever and bacteremia- now fever has resolved Continue antibiotic Daptomycin - will need 6 weeks minimum until 09/27/23  MRI brain small vessel  disease Repeat MRI lumbar spine- discitis L4/L5, edematous paraspinal tissue at l5 Repeat blood culture from 11/29 positive for gram positive cocci Repeat blood culture from 12/2 so far negative     Urinary retention- has foley, failed voiding trial   Discussed the management with patient  at bed side I will follow her as OP OPAT written She is waiting for disposition  ID will sign off- call if needed

## 2023-08-27 NOTE — Progress Notes (Signed)
Occupational Therapy Treatment Patient Details Name: Jocelyn Hooper MRN: [id number] DOB: [birth year] Today's Date: 08/27/2023   History of present illness Pt is an 82 yo F presenting due to AMS, with MRSA, sepsis, acute urinary retention, acute metabolic encephalopathy, arteriosclerotic CVD, and hypokalemia. PMH significant for L5-S1 TLIF (06/09/2023), anemia, L shoulder bursitis, CAD, CSF leak, IBS, Scoliosis, Trigeminal neuralgia, and B TKR.   OT comments  Ms Hedtke was seen for OT treatment on this date. Upon arrival to room pt in bed with family, requesting to use BSC, agreeable to tx. Pt requires MAX A for BSC t/f and +2 assist for pericare in standing across 3 standing trials (daughter assists). MAX A x2BSC>chair t/f. RN notified of pt's large BM. Pt making progress toward goals, will continue to follow POC. Discharge recommendation remains appropriate.        If plan is discharge home, recommend the following:  Help with stairs or ramp for entrance;Assist for transportation;Assistance with cooking/housework;Two people to help with walking and/or transfers;Two people to help with bathing/dressing/bathroom   Equipment Recommendations  Other (comment) (defer)    Recommendations for Other Services      Precautions / Restrictions Precautions Precautions: Fall Restrictions Weight Bearing Restrictions Per Provider Order: No       Mobility Bed Mobility Overal bed mobility: Needs Assistance Bed Mobility: Supine to Sit     Supine to sit: Max assist          Transfers Overall transfer level: Needs assistance Equipment used: 2 person hand held assist Transfers: Sit to/from Stand, Bed to chair/wheelchair/BSC Sit to Stand: Max assist   Squat pivot transfers: Max assist, +2 physical assistance             Balance Overall balance assessment: Needs assistance Sitting-balance support: Feet supported, Bilateral upper extremity supported Sitting balance-Leahy Scale: Poor    Postural control: Right lateral lean Standing balance support: Bilateral upper extremity supported Standing balance-Leahy Scale: Zero                             ADL either performed or assessed with clinical judgement   ADL Overall ADL's : Needs assistance/impaired                                       General ADL Comments: MAX A x2 for BSC t/f and pericare in standing (daughter assists).      Cognition Arousal: Alert Behavior During Therapy: Flat affect Overall Cognitive Status: Impaired/Different from baseline Area of Impairment: Memory, Following commands                     Memory: Decreased short-term memory Following Commands: Follows one step commands with increased time                           Pertinent Vitals/ Pain       Pain Assessment Pain Assessment: No/denies pain   Frequency  Min 1X/week        Progress Toward Goals  OT Goals(current goals can now be found in the care plan section)  Progress towards OT goals: Progressing toward goals  Acute Rehab OT Goals Patient Stated Goal: to walk OT Goal Formulation: With patient/family Time For Goal Achievement: 09/10/23 Potential to Achieve Goals: Fair ADL Goals Pt Will Perform Grooming: sitting;with mod  assist Pt Will Perform Upper Body Dressing: with mod assist;sitting Pt Will Transfer to Toilet: bedside commode;with mod assist;squat pivot transfer  Plan      Co-evaluation                 AM-PAC OT "6 Clicks" Daily Activity     Outcome Measure   Help from another person eating meals?: None Help from another person taking care of personal grooming?: A Little Help from another person toileting, which includes using toliet, bedpan, or urinal?: A Lot Help from another person bathing (including washing, rinsing, drying)?: A Lot Help from another person to put on and taking off regular upper body clothing?: A Lot Help from another person to put on  and taking off regular lower body clothing?: A Lot 6 Click Score: 15    End of Session    OT Visit Diagnosis: Muscle weakness (generalized) (M62.81);Other abnormalities of gait and mobility (R26.89);Unsteadiness on feet (R26.81)   Activity Tolerance Patient tolerated treatment well   Patient Left in chair;with call bell/phone within reach;with chair alarm set;with family/visitor present   Nurse Communication Mobility status        Time: 5409-8119 OT Time Calculation (min): 34 min  Charges: OT General Charges $OT Visit: 1 Visit OT Evaluation $OT Re-eval: 1 Re-eval OT Treatments $Self Care/Home Management : 23-37 mins  Kathie Dike, M.S. OTR/L  08/27/23, 12:08 PM  ascom [phone]

## 2023-08-27 NOTE — Plan of Care (Signed)
  Problem: Clinical Measurements: Goal: Ability to maintain clinical measurements within normal limits will improve Outcome: Progressing Goal: Will remain free from infection Outcome: Progressing Goal: Diagnostic test results will improve Outcome: Progressing Goal: Respiratory complications will improve Outcome: Progressing   Problem: Activity: Goal: Risk for activity intolerance will decrease Outcome: Progressing   Problem: Elimination: Goal: Will not experience complications related to bowel motility Outcome: Progressing Goal: Will not experience complications related to urinary retention Outcome: Progressing

## 2023-08-27 NOTE — Progress Notes (Signed)
PROGRESS NOTE    Jocelyn Hooper   VHQ:[id number] DOB: Dec 20, 1940  DOA: 08/08/2023 Date of Service: 08/27/23 which is hospital day 19  PCP: Danella Penton, MD       Hospital course / significant events:  [phone] with h/o IDA/B12 deficiency scoliosis s/p intervention 06/09/2023 who presented on 12/3 with AMS.  She was septic with MRSA bacteremia related to discitis/osteomyelitis at L4-5.  TEE negative.  On Daptomycin and Ceftaroline. SNF placement pending - will accept Monday 12/16  Consultants:  Infectious Disease Cardiology Palliative care   Procedures/Surgeries: TEE 11/27 Echocardiogram 11/28      ASSESSMENT & PLAN:   Sepsis due to MRSA bacteremia/L4-5 osteomyelitis/discitis Sepsis POA, as evidenced by fevers, tachycardia, tachypnea, leukocytosis - pt presented w/ severe left hip pain and is status post recent L5-S1 decompression, instrumentation and fusion on 06/09/2023.  Blood cultures +MRSA in all 4 bottles Initial MRI of the lumbar spine negative but repeat with discitis/osteomyelitis at L4-L5 TEE negative for endocarditis on 11/28 Based on persistent pain and minimal ability to participate in therapy, repeated lumbosacral MRI on 12/6 with no significant change from priorInitial repeat Bcx from 11/26 no growth & final Repeat blood cultures 11/29 again +MRSA  Repeat blood cultures again on 12/2 negative x 5 days S/p PICC placement  ID is following IV Daptomycin through 09/27/23 SNF rehab recommended - pending auth   Toxic encephalopathy - resolved  Due to one time dose of Remeron 7.5 mg given to try to boost mood and appetite Medication was stopped and add to allergy list Negative head CT Symptoms resolved after cessation of Remeron monitor   Inadequate Pain Control  Goal of adequate pain control for mobility, pt was ambulating with walker just prior to admission Pain recurred without MS Contin (which has held d/t encephalopathy), so this was resumed Palliative  care following  Increased gabapentin to 300 TID (was just on at bedtime) Consider addition of Cymbalta as an outpatient   Constipation likely opiate induced Movantik initiated  Continue stool softeners/laxatives  Enema prn   Acute urinary retention Significant bladder distention noted on CT imaging with patient's inability to urinate afterwards   In-N-Out catheter with 600 cc Unclear etiology, possibly related to spinal infection Foley catheter placed 11/26 Added tamsulosin  Failed voiding trial, foley replaced 12/6 She will need outpatient urology f/u in 2-3 weeks   ASCVD (arteriosclerotic cardiovascular disease) Continue home aspirin and metoprolol   Hyponatremia Appears to be chronic, stable Will monitor periodically   Thrombocytosis Likely reactive Will continue to monitor   Ambulatory dysfunction Infection has led to significant weakness She is likely to benefit from SNF rehab Of note, her husband is also down with a bad back and cannot help with care for her at home   She is quite opposed to rehab but appears to be reluctantly agreeable Awaiting placement        Normal weight based on BMI: Body mass index is 21.09 kg/m.  Underweight - under 18.5  normal weight - 18.5 to 24.9 overweight - 25 to 29.9 obese - 30 or more   DVT prophylaxis: lovenox  IV fluids: no continuous IV fluids  Nutrition: regular diet Central lines / invasive devices: Foley, PICC  Code Status: FULL CODE  ACP documentation reviewed: none on file in [facility]  TOC needs: placement Barriers to dispo / significant pending items: placement - auth is pending              Subjective / Brief  ROS:  Patient reports no concerns at this time  Denies CP/SOB.  Pain controlled.  Large BM this morning   Family Communication: family at bedside on rounds    Objective Findings:  Vitals:   08/26/23 2334 08/27/23 0350 08/27/23 0811 08/27/23 1247  BP: 121/67 (!) 106/57 (!) 108/56 (!)  111/59  Pulse: 86 64 65 94  Resp:  19 16 16   Temp: 98.4 F (36.9 C) 98.4 F (36.9 C) 98.3 F (36.8 C) 98.3 F (36.8 C)  TempSrc: Oral Oral    SpO2: 99% 98% 96% 100%  Weight:      Height:        Intake/Output Summary (Last 24 hours) at 08/27/2023 1527 Last data filed at 08/27/2023 1449 Gross per 24 hour  Intake 240 ml  Output 1300 ml  Net -1060 ml   Filed Weights   08/09/23 0703 08/11/23 0722 08/23/23 0400  Weight: 56 kg 56 kg 57.5 kg    Examination:  Physical Exam Constitutional:      Appearance: Normal appearance.  Cardiovascular:     Rate and Rhythm: Normal rate and regular rhythm.  Pulmonary:     Effort: Pulmonary effort is normal.     Breath sounds: Normal breath sounds.  Abdominal:     General: Abdomen is flat.     Palpations: Abdomen is soft.  Musculoskeletal:     Right lower leg: No edema.     Left lower leg: No edema.  Skin:    General: Skin is warm and dry.  Neurological:     General: No focal deficit present.     Mental Status: She is alert.  Psychiatric:        Mood and Affect: Mood normal.        Behavior: Behavior normal.          Scheduled Medications:   acetaminophen  650 mg Oral Q6H   Or   acetaminophen  650 mg Rectal Q6H   aspirin EC  81 mg Oral Daily   Chlorhexidine Gluconate Cloth  6 each Topical Daily   enoxaparin (LOVENOX) injection  40 mg Subcutaneous Q24H   estradiol  0.5 mg Oral Daily   feeding supplement  237 mL Oral BID BM   fluticasone  1 spray Each Nare Daily   gabapentin  300 mg Oral TID   loratadine  10 mg Oral Daily   magnesium hydroxide  15 mL Oral Once   metoprolol succinate  25 mg Oral Daily   morphine  15 mg Oral Q12H   [START ON 08/28/2023] naloxegol oxalate  25 mg Oral Daily   pantoprazole  40 mg Oral Daily   [START ON 08/28/2023] polyethylene glycol  17 g Oral BID   senna-docusate  1 tablet Oral BID   sodium chloride flush  10-40 mL Intracatheter Q12H   sodium chloride flush  3 mL Intravenous Q12H    tamsulosin  0.4 mg Oral QPC supper   topiramate  150 mg Oral QHS    Continuous Infusions:  DAPTOmycin 500 mg (08/27/23 1431)    PRN Medications:  bisacodyl, HYDROcodone-acetaminophen, melatonin, ondansetron **OR** ondansetron (ZOFRAN) IV, mouth rinse, sodium chloride flush, sodium phosphate  Antimicrobials from admission:  Anti-infectives (From admission, onward)    Start     Dose/Rate Route Frequency Ordered Stop   08/21/23 1400  DAPTOmycin (CUBICIN) 500 mg in sodium chloride 0.9 % IVPB  Status:  Discontinued        9 mg/kg  56 kg 120 mL/hr over  30 Minutes Intravenous Daily 08/21/23 0713 08/21/23 0719   08/21/23 1400  DAPTOmycin (CUBICIN) IVPB 500 mg/73mL premix        9 mg/kg  56 kg 100 mL/hr over 30 Minutes Intravenous Daily 08/21/23 0719     08/16/23 1500  ceftaroline (TEFLARO) 600 mg in sodium chloride 0.9 % 100 mL IVPB  Status:  Discontinued        600 mg 100 mL/hr over 60 Minutes Intravenous Every 8 hours 08/16/23 1354 08/23/23 1222   08/16/23 1445  DAPTOmycin (CUBICIN) IVPB 500 mg/68mL premix  Status:  Discontinued        9 mg/kg  56 kg 100 mL/hr over 30 Minutes Intravenous Daily 08/16/23 1359 08/21/23 0712   08/13/23 2200  cefTRIAXone (ROCEPHIN) 2 g in sodium chloride 0.9 % 100 mL IVPB  Status:  Discontinued        2 g 200 mL/hr over 30 Minutes Intravenous Every 24 hours 08/13/23 2108 08/16/23 0902   08/12/23 2200  vancomycin (VANCOREADY) IVPB 500 mg/100 mL  Status:  Discontinued        500 mg 100 mL/hr over 60 Minutes Intravenous Every 12 hours 08/12/23 2030 08/12/23 2049   08/12/23 2200  vancomycin (VANCOREADY) IVPB 1250 mg/250 mL  Status:  Discontinued        1,250 mg 166.7 mL/hr over 90 Minutes Intravenous Every 24 hours 08/12/23 2049 08/16/23 1359   08/10/23 0100  vancomycin (VANCOCIN) IVPB 1000 mg/200 mL premix  Status:  Discontinued        1,000 mg 200 mL/hr over 60 Minutes Intravenous Every 24 hours 08/09/23 0108 08/09/23 1520   08/09/23 2200  vancomycin  (VANCOREADY) IVPB 750 mg/150 mL  Status:  Discontinued        750 mg 150 mL/hr over 60 Minutes Intravenous Every 24 hours 08/09/23 1520 08/12/23 2030   08/09/23 0100  vancomycin (VANCOREADY) IVPB 1250 mg/250 mL        1,250 mg 166.7 mL/hr over 90 Minutes Intravenous  Once 08/09/23 0058 08/09/23 0700   08/08/23 1145  cefTRIAXone (ROCEPHIN) 2 g in sodium chloride 0.9 % 100 mL IVPB  Status:  Discontinued        2 g 200 mL/hr over 30 Minutes Intravenous Every 24 hours 08/08/23 1139 08/09/23 0104           Data Reviewed:  I have personally reviewed the following...  CBC: Recent Labs  Lab 08/21/23 0522 08/22/23 0630 08/23/23 0609 08/24/23 0442 08/25/23 0502  WBC 13.5* 12.1* 9.3 9.3 9.5  NEUTROABS 11.2* 9.9* 7.1 6.7 6.9  HGB 8.7* 8.5* 8.5* 8.7* 8.5*  HCT 26.9* 25.8* 25.7* 27.7* 26.6*  MCV 95.4 93.1 94.1 97.2 95.7  PLT 582* 591* 587* 573* 548*   Basic Metabolic Panel: Recent Labs  Lab 08/21/23 0522 08/22/23 0630 08/23/23 0609 08/24/23 0442 08/25/23 0502  NA 132* 130* 134* 131* 134*  K 3.8 3.8 3.8 3.7 3.5  CL 105 101 108 102 105  CO2 20* 21* 18* 22 21*  GLUCOSE 92 104* 98 111* 108*  BUN 15 18 18 18 16   CREATININE 0.73 0.82 0.75 0.73 0.76  CALCIUM 8.5* 8.3* 8.5* 8.5* 8.5*   GFR: Estimated Creatinine Clearance: 47.1 mL/min (by C-G formula based on SCr of 0.76 mg/dL). Liver Function Tests: Recent Labs  Lab 08/23/23 0609  AST 19  ALT 23  ALKPHOS 255*  BILITOT 0.3  PROT 5.5*  ALBUMIN 2.0*   No results for input(s): "LIPASE", "AMYLASE" in the last 168 hours.  Recent Labs  Lab 08/23/23 1511  AMMONIA 26   Coagulation Profile: No results for input(s): "INR", "PROTIME" in the last 168 hours. Cardiac Enzymes: Recent Labs  Lab 08/23/23 0609  CKTOTAL 20*   BNP (last 3 results) No results for input(s): "PROBNP" in the last 8760 hours. HbA1C: No results for input(s): "HGBA1C" in the last 72 hours. CBG: Recent Labs  Lab 08/22/23 0148  GLUCAP 99   Lipid  Profile: No results for input(s): "CHOL", "HDL", "LDLCALC", "TRIG", "CHOLHDL", "LDLDIRECT" in the last 72 hours. Thyroid Function Tests: No results for input(s): "TSH", "T4TOTAL", "FREET4", "T3FREE", "THYROIDAB" in the last 72 hours. Anemia Panel: No results for input(s): "VITAMINB12", "FOLATE", "FERRITIN", "TIBC", "IRON", "RETICCTPCT" in the last 72 hours. Most Recent Urinalysis On File:     Component Value Date/Time   COLORURINE YELLOW (A) 08/08/2023 1614   APPEARANCEUR HAZY (A) 08/08/2023 1614   APPEARANCEUR Hazy (A) 04/27/2023 1340   LABSPEC 1.016 08/08/2023 1614   LABSPEC 1.015 08/29/2012 0945   PHURINE 8.0 08/08/2023 1614   GLUCOSEU NEGATIVE 08/08/2023 1614   GLUCOSEU Negative 08/29/2012 0945   HGBUR NEGATIVE 08/08/2023 1614   BILIRUBINUR NEGATIVE 08/08/2023 1614   BILIRUBINUR Negative 04/27/2023 1340   BILIRUBINUR Negative 08/29/2012 0945   KETONESUR 20 (A) 08/08/2023 1614   PROTEINUR NEGATIVE 08/08/2023 1614   NITRITE NEGATIVE 08/08/2023 1614   LEUKOCYTESUR NEGATIVE 08/08/2023 1614   LEUKOCYTESUR Negative 08/29/2012 0945   Sepsis Labs: @LABRCNTIP (procalcitonin:4,lacticidven:4) Microbiology: No results found for this or any previous visit (from the past 240 hours).     Radiology Studies last 3 days: No results found.       Sunnie Nielsen, DO [facility] 08/27/2023, 3:27 PM    Dictation software may have been used to generate the above note. Typos may occur and escape review in typed/dictated notes. Please contact Dr Lyn Hollingshead directly for clarity if needed.  Staff may message me via secure chat in Epic  but this may not receive an immediate response,  please page me for urgent matters!  If 7PM-7AM, please contact night coverage [email or web address]

## 2023-08-27 NOTE — TOC Progression Note (Signed)
Transition of Care [facility]) - Progression Note    Patient Details  Name: Jocelyn Hooper MRN: [id number] Date of Birth: 12/03/1940  [facility] CM/SW Contact  Truddie Hidden, RN Phone Number: 08/27/2023, 3:12 PM  Clinical Narrative:    Retrieved phone call from [place] at [facility]. Patient was approved for SNF for 7 days # [id number] and EMS # [phone].  Spoke with [employer]. Facility will accept patient Monday.    Expected Discharge Plan: Home w Home Health Services Barriers to Discharge: Continued Medical Work up  Expected Discharge Plan and Services                                               Social Determinants of Health (SDOH) Interventions SDOH Screenings   Food Insecurity: No Food Insecurity (08/09/2023)  Housing: Low Risk  (08/26/2023)  Transportation Needs: No Transportation Needs (08/09/2023)  Utilities: Not At Risk (08/09/2023)  Financial Resource Strain: Low Risk  (07/01/2023)   Received from [facility]  Tobacco Use: Low Risk  (08/08/2023)    Readmission Risk Interventions     No data to display

## 2023-08-28 DIAGNOSIS — B9562 Methicillin resistant Staphylococcus aureus infection as the cause of diseases classified elsewhere: Secondary | ICD-10-CM | POA: Diagnosis not present

## 2023-08-28 DIAGNOSIS — A4102 Sepsis due to Methicillin resistant Staphylococcus aureus: Secondary | ICD-10-CM | POA: Diagnosis not present

## 2023-08-28 DIAGNOSIS — G9341 Metabolic encephalopathy: Secondary | ICD-10-CM | POA: Diagnosis not present

## 2023-08-28 DIAGNOSIS — R6520 Severe sepsis without septic shock: Secondary | ICD-10-CM | POA: Diagnosis not present

## 2023-08-28 DIAGNOSIS — R7881 Bacteremia: Secondary | ICD-10-CM | POA: Diagnosis not present

## 2023-08-28 MED ORDER — METOPROLOL SUCCINATE ER 25 MG PO TB24
12.5000 mg | ORAL_TABLET | Freq: Every day | ORAL | Status: DC
  Administered 2023-08-28 – 2023-08-30 (×3): 12.5 mg via ORAL
  Filled 2023-08-28 (×3): qty 1

## 2023-08-28 NOTE — Progress Notes (Signed)
Patient refused to removed her foley catheter, MD made aware.

## 2023-08-28 NOTE — Plan of Care (Signed)
  Problem: Education: Goal: Knowledge of General Education information will improve Description Including pain rating scale, medication(s)/side effects and non-pharmacologic comfort measures Outcome: Progressing   

## 2023-08-28 NOTE — Progress Notes (Signed)
PROGRESS NOTE    Jocelyn Hooper   ZOX:[id number] DOB: 10-Apr-1941  DOA: 08/08/2023 Date of Service: 08/28/23 which is hospital day 20  PCP: Danella Penton, MD       Hospital course / significant events:  [phone] with h/o IDA/B12 deficiency scoliosis s/p intervention 06/09/2023 who presented on 12/3 with AMS.  She was septic with MRSA bacteremia related to discitis/osteomyelitis at L4-5.  TEE negative.  On Daptomycin and Ceftaroline. SNF placement pending - will accept Monday 12/16  Consultants:  Infectious Disease Cardiology Palliative care   Procedures/Surgeries: TEE 11/27 Echocardiogram 11/28      ASSESSMENT & PLAN:   Sepsis due to MRSA bacteremia/L4-5 osteomyelitis/discitis Sepsis POA, as evidenced by fevers, tachycardia, tachypnea, leukocytosis - pt presented w/ severe left hip pain and is status post recent L5-S1 decompression, instrumentation and fusion on 06/09/2023.  Blood cultures +MRSA in all 4 bottles Initial MRI of the lumbar spine negative but repeat with discitis/osteomyelitis at L4-L5 TEE negative for endocarditis on 11/28 Based on persistent pain and minimal ability to participate in therapy, repeated lumbosacral MRI on 12/6 with no significant change from priorInitial repeat Bcx from 11/26 no growth & final Repeat blood cultures 11/29 again +MRSA  Repeat blood cultures again on 12/2 negative x 5 days S/p PICC placement  ID is following IV Daptomycin through 09/27/23 SNF rehab recommended - pending auth   Toxic encephalopathy - resolved  Due to one time dose of Remeron 7.5 mg given to try to boost mood and appetite Medication was stopped and add to allergy list Negative head CT Symptoms resolved after cessation of Remeron monitor   Inadequate Pain Control  Goal of adequate pain control for mobility, pt was ambulating with walker just prior to admission Pain recurred without MS Contin (which has held d/t encephalopathy), so this was resumed Palliative  care following  Increased gabapentin to 300 TID (was just on at bedtime) Consider addition of Cymbalta as an outpatient   Constipation likely opiate induced Movantik initiated  Continue stool softeners/laxatives  Enema prn   Acute urinary retention Significant bladder distention noted on CT imaging with patient's inability to urinate afterwards   In-N-Out catheter with 600 cc Unclear etiology, possibly related to spinal infection Foley catheter placed 11/26 Added tamsulosin  Failed voiding trial, foley replaced 12/6 She will need outpatient urology f/u in 2-3 weeks   ASCVD (arteriosclerotic cardiovascular disease) Continue home aspirin and metoprolol   Hyponatremia Appears to be chronic, stable Will monitor periodically   Thrombocytosis Likely reactive Will continue to monitor   Ambulatory dysfunction Infection has led to significant weakness She is likely to benefit from SNF rehab Of note, her husband is also down with a bad back and cannot help with care for her at home   She is quite opposed to rehab but appears to be reluctantly agreeable Awaiting placement        Normal weight based on BMI: Body mass index is 21.09 kg/m.  Underweight - under 18.5  normal weight - 18.5 to 24.9 overweight - 25 to 29.9 obese - 30 or more   DVT prophylaxis: lovenox  IV fluids: no continuous IV fluids  Nutrition: regular diet Central lines / invasive devices: Foley, PICC  Code Status: FULL CODE  ACP documentation reviewed: none on file in [facility]  TOC needs: placement Barriers to dispo / significant pending items: placement - auth is pending              Subjective / Brief  ROS:  Patient reports no concerns at this time  Denies CP/SOB.  Pain controlled.   Family Communication: none at this time   Objective Findings:  Vitals:   08/27/23 2259 08/28/23 0358 08/28/23 0741 08/28/23 1226  BP: (!) 101/52 (!) 98/55 (!) 107/51 117/65  Pulse: 81 69 66 66  Resp:  18 20 16 16   Temp: 98.7 F (37.1 C) (!) 97.5 F (36.4 C) 98.1 F (36.7 C) (!) 97.5 F (36.4 C)  TempSrc:      SpO2: 97% 98% 98% 97%  Weight:      Height:        Intake/Output Summary (Last 24 hours) at 08/28/2023 1416 Last data filed at 08/28/2023 1000 Gross per 24 hour  Intake 530 ml  Output 1200 ml  Net -670 ml   Filed Weights   08/09/23 0703 08/11/23 0722 08/23/23 0400  Weight: 56 kg 56 kg 57.5 kg    Examination:  Physical Exam Constitutional:      Appearance: Normal appearance.  Cardiovascular:     Rate and Rhythm: Normal rate and regular rhythm.  Pulmonary:     Effort: Pulmonary effort is normal.     Breath sounds: Normal breath sounds.  Abdominal:     General: Abdomen is flat.     Palpations: Abdomen is soft.  Musculoskeletal:     Right lower leg: No edema.     Left lower leg: No edema.  Skin:    General: Skin is warm and dry.  Neurological:     General: No focal deficit present.     Mental Status: She is alert.  Psychiatric:        Mood and Affect: Mood normal.        Behavior: Behavior normal.          Scheduled Medications:   acetaminophen  650 mg Oral Q6H   Or   acetaminophen  650 mg Rectal Q6H   aspirin EC  81 mg Oral Daily   Chlorhexidine Gluconate Cloth  6 each Topical Daily   enoxaparin (LOVENOX) injection  40 mg Subcutaneous Q24H   estradiol  0.5 mg Oral Daily   feeding supplement  237 mL Oral BID BM   fluticasone  1 spray Each Nare Daily   gabapentin  300 mg Oral TID   loratadine  10 mg Oral Daily   metoprolol succinate  12.5 mg Oral Daily   morphine  15 mg Oral Q12H   naloxegol oxalate  25 mg Oral Daily   pantoprazole  40 mg Oral Daily   polyethylene glycol  17 g Oral BID   senna-docusate  1 tablet Oral BID   sodium chloride flush  10-40 mL Intracatheter Q12H   tamsulosin  0.4 mg Oral QPC supper   topiramate  150 mg Oral QHS    Continuous Infusions:  DAPTOmycin 500 mg (08/28/23 1402)    PRN Medications:  bisacodyl,  HYDROcodone-acetaminophen, melatonin, ondansetron **OR** ondansetron (ZOFRAN) IV, mouth rinse, sodium chloride flush, sodium phosphate  Antimicrobials from admission:  Anti-infectives (From admission, onward)    Start     Dose/Rate Route Frequency Ordered Stop   08/21/23 1400  DAPTOmycin (CUBICIN) 500 mg in sodium chloride 0.9 % IVPB  Status:  Discontinued        9 mg/kg  56 kg 120 mL/hr over 30 Minutes Intravenous Daily 08/21/23 0713 08/21/23 0719   08/21/23 1400  DAPTOmycin (CUBICIN) IVPB 500 mg/76mL premix        9 mg/kg  56  kg 100 mL/hr over 30 Minutes Intravenous Daily 08/21/23 0719     08/16/23 1500  ceftaroline (TEFLARO) 600 mg in sodium chloride 0.9 % 100 mL IVPB  Status:  Discontinued        600 mg 100 mL/hr over 60 Minutes Intravenous Every 8 hours 08/16/23 1354 08/23/23 1222   08/16/23 1445  DAPTOmycin (CUBICIN) IVPB 500 mg/2mL premix  Status:  Discontinued        9 mg/kg  56 kg 100 mL/hr over 30 Minutes Intravenous Daily 08/16/23 1359 08/21/23 0712   08/13/23 2200  cefTRIAXone (ROCEPHIN) 2 g in sodium chloride 0.9 % 100 mL IVPB  Status:  Discontinued        2 g 200 mL/hr over 30 Minutes Intravenous Every 24 hours 08/13/23 2108 08/16/23 0902   08/12/23 2200  vancomycin (VANCOREADY) IVPB 500 mg/100 mL  Status:  Discontinued        500 mg 100 mL/hr over 60 Minutes Intravenous Every 12 hours 08/12/23 2030 08/12/23 2049   08/12/23 2200  vancomycin (VANCOREADY) IVPB 1250 mg/250 mL  Status:  Discontinued        1,250 mg 166.7 mL/hr over 90 Minutes Intravenous Every 24 hours 08/12/23 2049 08/16/23 1359   08/10/23 0100  vancomycin (VANCOCIN) IVPB 1000 mg/200 mL premix  Status:  Discontinued        1,000 mg 200 mL/hr over 60 Minutes Intravenous Every 24 hours 08/09/23 0108 08/09/23 1520   08/09/23 2200  vancomycin (VANCOREADY) IVPB 750 mg/150 mL  Status:  Discontinued        750 mg 150 mL/hr over 60 Minutes Intravenous Every 24 hours 08/09/23 1520 08/12/23 2030   08/09/23 0100   vancomycin (VANCOREADY) IVPB 1250 mg/250 mL        1,250 mg 166.7 mL/hr over 90 Minutes Intravenous  Once 08/09/23 0058 08/09/23 0700   08/08/23 1145  cefTRIAXone (ROCEPHIN) 2 g in sodium chloride 0.9 % 100 mL IVPB  Status:  Discontinued        2 g 200 mL/hr over 30 Minutes Intravenous Every 24 hours 08/08/23 1139 08/09/23 0104           Data Reviewed:  I have personally reviewed the following...  CBC: Recent Labs  Lab 08/22/23 0630 08/23/23 0609 08/24/23 0442 08/25/23 0502  WBC 12.1* 9.3 9.3 9.5  NEUTROABS 9.9* 7.1 6.7 6.9  HGB 8.5* 8.5* 8.7* 8.5*  HCT 25.8* 25.7* 27.7* 26.6*  MCV 93.1 94.1 97.2 95.7  PLT 591* 587* 573* 548*   Basic Metabolic Panel: Recent Labs  Lab 08/22/23 0630 08/23/23 0609 08/24/23 0442 08/25/23 0502  NA 130* 134* 131* 134*  K 3.8 3.8 3.7 3.5  CL 101 108 102 105  CO2 21* 18* 22 21*  GLUCOSE 104* 98 111* 108*  BUN 18 18 18 16   CREATININE 0.82 0.75 0.73 0.76  CALCIUM 8.3* 8.5* 8.5* 8.5*   GFR: Estimated Creatinine Clearance: 47.1 mL/min (by C-G formula based on SCr of 0.76 mg/dL). Liver Function Tests: Recent Labs  Lab 08/23/23 0609  AST 19  ALT 23  ALKPHOS 255*  BILITOT 0.3  PROT 5.5*  ALBUMIN 2.0*   No results for input(s): "LIPASE", "AMYLASE" in the last 168 hours. Recent Labs  Lab 08/23/23 1511  AMMONIA 26   Coagulation Profile: No results for input(s): "INR", "PROTIME" in the last 168 hours. Cardiac Enzymes: Recent Labs  Lab 08/23/23 0609  CKTOTAL 20*   BNP (last 3 results) No results for input(s): "PROBNP" in the  last 8760 hours. HbA1C: No results for input(s): "HGBA1C" in the last 72 hours. CBG: Recent Labs  Lab 08/22/23 0148  GLUCAP 99   Lipid Profile: No results for input(s): "CHOL", "HDL", "LDLCALC", "TRIG", "CHOLHDL", "LDLDIRECT" in the last 72 hours. Thyroid Function Tests: No results for input(s): "TSH", "T4TOTAL", "FREET4", "T3FREE", "THYROIDAB" in the last 72 hours. Anemia Panel: No results for  input(s): "VITAMINB12", "FOLATE", "FERRITIN", "TIBC", "IRON", "RETICCTPCT" in the last 72 hours. Most Recent Urinalysis On File:     Component Value Date/Time   COLORURINE YELLOW (A) 08/08/2023 1614   APPEARANCEUR HAZY (A) 08/08/2023 1614   APPEARANCEUR Hazy (A) 04/27/2023 1340   LABSPEC 1.016 08/08/2023 1614   LABSPEC 1.015 08/29/2012 0945   PHURINE 8.0 08/08/2023 1614   GLUCOSEU NEGATIVE 08/08/2023 1614   GLUCOSEU Negative 08/29/2012 0945   HGBUR NEGATIVE 08/08/2023 1614   BILIRUBINUR NEGATIVE 08/08/2023 1614   BILIRUBINUR Negative 04/27/2023 1340   BILIRUBINUR Negative 08/29/2012 0945   KETONESUR 20 (A) 08/08/2023 1614   PROTEINUR NEGATIVE 08/08/2023 1614   NITRITE NEGATIVE 08/08/2023 1614   LEUKOCYTESUR NEGATIVE 08/08/2023 1614   LEUKOCYTESUR Negative 08/29/2012 0945   Sepsis Labs: @LABRCNTIP (procalcitonin:4,lacticidven:4) Microbiology: No results found for this or any previous visit (from the past 240 hours).     Radiology Studies last 3 days: No results found.       Sunnie Nielsen, DO [facility] 08/28/2023, 2:16 PM    Dictation software may have been used to generate the above note. Typos may occur and escape review in typed/dictated notes. Please contact Dr Lyn Hollingshead directly for clarity if needed.  Staff may message me via secure chat in Epic  but this may not receive an immediate response,  please page me for urgent matters!  If 7PM-7AM, please contact night coverage [email or web address]

## 2023-08-29 DIAGNOSIS — R7881 Bacteremia: Secondary | ICD-10-CM | POA: Diagnosis not present

## 2023-08-29 DIAGNOSIS — G9341 Metabolic encephalopathy: Secondary | ICD-10-CM | POA: Diagnosis not present

## 2023-08-29 DIAGNOSIS — R6520 Severe sepsis without septic shock: Secondary | ICD-10-CM | POA: Diagnosis not present

## 2023-08-29 DIAGNOSIS — B9562 Methicillin resistant Staphylococcus aureus infection as the cause of diseases classified elsewhere: Secondary | ICD-10-CM | POA: Diagnosis not present

## 2023-08-29 DIAGNOSIS — A4102 Sepsis due to Methicillin resistant Staphylococcus aureus: Secondary | ICD-10-CM | POA: Diagnosis not present

## 2023-08-29 LAB — CBC
HCT: 27.5 % — ABNORMAL LOW (ref 36.0–46.0)
Hemoglobin: 8.8 g/dL — ABNORMAL LOW (ref 12.0–15.0)
MCH: 30.6 pg (ref 26.0–34.0)
MCHC: 32 g/dL (ref 30.0–36.0)
MCV: 95.5 fL (ref 80.0–100.0)
Platelets: 539 10*3/uL — ABNORMAL HIGH (ref 150–400)
RBC: 2.88 MIL/uL — ABNORMAL LOW (ref 3.87–5.11)
RDW: 14.2 % (ref 11.5–15.5)
WBC: 9 10*3/uL (ref 4.0–10.5)
nRBC: 0 % (ref 0.0–0.2)

## 2023-08-29 LAB — BASIC METABOLIC PANEL
Anion gap: 7 (ref 5–15)
BUN: 15 mg/dL (ref 8–23)
CO2: 24 mmol/L (ref 22–32)
Calcium: 8.4 mg/dL — ABNORMAL LOW (ref 8.9–10.3)
Chloride: 105 mmol/L (ref 98–111)
Creatinine, Ser: 0.72 mg/dL (ref 0.44–1.00)
GFR, Estimated: >60 mL/min (ref >60)
Glucose, Bld: 100 mg/dL — ABNORMAL HIGH (ref 70–99)
Potassium: 3.8 mmol/L (ref 3.5–5.1)
Sodium: 136 mmol/L (ref 135–145)

## 2023-08-29 NOTE — Progress Notes (Signed)
PROGRESS NOTE    Jocelyn Hooper   YNW:[id number] DOB: 29-Sep-1940  DOA: 08/08/2023 Date of Service: 08/29/23 which is hospital day 21  PCP: Danella Penton, MD       Hospital course / significant events:  [phone] with h/o IDA/B12 deficiency scoliosis s/p intervention 06/09/2023 who presented on 12/3 with AMS.  She was septic with MRSA bacteremia related to discitis/osteomyelitis at L4-5.  TEE negative.  On Daptomycin and Ceftaroline. SNF placement pending - will accept Monday 12/16  Consultants:  Infectious Disease Cardiology Palliative care   Procedures/Surgeries: TEE 11/27 Echocardiogram 11/28      ASSESSMENT & PLAN:   Sepsis due to MRSA bacteremia/L4-5 osteomyelitis/discitis Sepsis POA, as evidenced by fevers, tachycardia, tachypnea, leukocytosis - pt presented w/ severe left hip pain and is status post recent L5-S1 decompression, instrumentation and fusion on 06/09/2023.  Blood cultures +MRSA in all 4 bottles Initial MRI of the lumbar spine negative but repeat with discitis/osteomyelitis at L4-L5 TEE negative for endocarditis on 11/28 Based on persistent pain and minimal ability to participate in therapy, repeated lumbosacral MRI on 12/6 with no significant change from priorInitial repeat Bcx from 11/26 no growth & final Repeat blood cultures 11/29 again +MRSA  Repeat blood cultures again on 12/2 negative x 5 days S/p PICC placement  ID is following IV Daptomycin through 09/27/23 SNF rehab recommended - pending auth   Toxic encephalopathy - resolved  Due to one time dose of Remeron 7.5 mg given to try to boost mood and appetite Medication was stopped and add to allergy list Negative head CT Symptoms resolved after cessation of Remeron monitor   Inadequate Pain Control  Goal of adequate pain control for mobility, pt was ambulating with walker just prior to admission Pain recurred without MS Contin (which has held d/t encephalopathy), so this was resumed Palliative  care following  Increased gabapentin to 300 TID (was just on at bedtime) Consider addition of Cymbalta as an outpatient   Constipation likely opiate induced Movantik initiated  Continue stool softeners/laxatives  Enema prn   Acute urinary retention Significant bladder distention noted on CT imaging with patient's inability to urinate afterwards   Foley catheter placed 11/26 Added tamsulosin  Failed voiding trial, foley replaced 12/6, trial void again 12/15 She will need outpatient urology f/u if still having retention and FOley needing re-placed    ASCVD (arteriosclerotic cardiovascular disease) Continue home aspirin and metoprolol   Hyponatremia Appears to be chronic, stable Will monitor periodically   Thrombocytosis Likely reactive Will continue to monitor   Ambulatory dysfunction Infection has led to significant weakness She is likely to benefit from SNF rehab Of note, her husband is also down with a bad back and cannot help with care for her at home   She is quite opposed to rehab but appears to be reluctantly agreeable Awaiting placement        Normal weight based on BMI: Body mass index is 21.09 kg/m.  Underweight - under 18.5  normal weight - 18.5 to 24.9 overweight - 25 to 29.9 obese - 30 or more   DVT prophylaxis: lovenox  IV fluids: no continuous IV fluids  Nutrition: regular diet Central lines / invasive devices: Foley, PICC  Code Status: FULL CODE  ACP documentation reviewed: none on file in [facility]  TOC needs: placement Barriers to dispo / significant pending items: placement - auth is pending              Subjective / Brief ROS:  Patient reports no concerns at this time  Would like to try Foley out Denies CP/SOB.  Pain controlled.   Family Communication: none at this time   Objective Findings:  Vitals:   08/29/23 0030 08/29/23 0528 08/29/23 0735 08/29/23 1159  BP: (!) 106/59 109/61 (!) 101/52 (!) 108/58  Pulse: 83 92 77 76   Resp: 14 14 19 19   Temp: 98.6 F (37 C) 98 F (36.7 C) 98 F (36.7 C) 98 F (36.7 C)  TempSrc: Oral Oral    SpO2: 97% 97% 97% 99%  Weight:      Height:        Intake/Output Summary (Last 24 hours) at 08/29/2023 1404 Last data filed at 08/29/2023 0950 Gross per 24 hour  Intake 10 ml  Output 1625 ml  Net -1615 ml   Filed Weights   08/09/23 0703 08/11/23 0722 08/23/23 0400  Weight: 56 kg 56 kg 57.5 kg    Examination:  Physical Exam Constitutional:      Appearance: Normal appearance.  Cardiovascular:     Rate and Rhythm: Normal rate and regular rhythm.  Pulmonary:     Effort: Pulmonary effort is normal.     Breath sounds: Normal breath sounds.  Abdominal:     General: Abdomen is flat.     Palpations: Abdomen is soft.  Musculoskeletal:     Right lower leg: No edema.     Left lower leg: No edema.  Skin:    General: Skin is warm and dry.  Neurological:     General: No focal deficit present.     Mental Status: She is alert.  Psychiatric:        Mood and Affect: Mood normal.        Behavior: Behavior normal.          Scheduled Medications:   acetaminophen  650 mg Oral Q6H   Or   acetaminophen  650 mg Rectal Q6H   aspirin EC  81 mg Oral Daily   Chlorhexidine Gluconate Cloth  6 each Topical Daily   enoxaparin (LOVENOX) injection  40 mg Subcutaneous Q24H   estradiol  0.5 mg Oral Daily   feeding supplement  237 mL Oral BID BM   fluticasone  1 spray Each Nare Daily   gabapentin  300 mg Oral TID   loratadine  10 mg Oral Daily   metoprolol succinate  12.5 mg Oral Daily   morphine  15 mg Oral Q12H   naloxegol oxalate  25 mg Oral Daily   pantoprazole  40 mg Oral Daily   polyethylene glycol  17 g Oral BID   senna-docusate  1 tablet Oral BID   sodium chloride flush  10-40 mL Intracatheter Q12H   tamsulosin  0.4 mg Oral QPC supper   topiramate  150 mg Oral QHS    Continuous Infusions:  DAPTOmycin 500 mg (08/28/23 1402)    PRN Medications:  bisacodyl,  HYDROcodone-acetaminophen, melatonin, ondansetron **OR** ondansetron (ZOFRAN) IV, mouth rinse, sodium chloride flush, sodium phosphate  Antimicrobials from admission:  Anti-infectives (From admission, onward)    Start     Dose/Rate Route Frequency Ordered Stop   08/21/23 1400  DAPTOmycin (CUBICIN) 500 mg in sodium chloride 0.9 % IVPB  Status:  Discontinued        9 mg/kg  56 kg 120 mL/hr over 30 Minutes Intravenous Daily 08/21/23 0713 08/21/23 0719   08/21/23 1400  DAPTOmycin (CUBICIN) IVPB 500 mg/12mL premix        9 mg/kg  56 kg 100 mL/hr over 30 Minutes Intravenous Daily 08/21/23 0719     08/16/23 1500  ceftaroline (TEFLARO) 600 mg in sodium chloride 0.9 % 100 mL IVPB  Status:  Discontinued        600 mg 100 mL/hr over 60 Minutes Intravenous Every 8 hours 08/16/23 1354 08/23/23 1222   08/16/23 1445  DAPTOmycin (CUBICIN) IVPB 500 mg/55mL premix  Status:  Discontinued        9 mg/kg  56 kg 100 mL/hr over 30 Minutes Intravenous Daily 08/16/23 1359 08/21/23 0712   08/13/23 2200  cefTRIAXone (ROCEPHIN) 2 g in sodium chloride 0.9 % 100 mL IVPB  Status:  Discontinued        2 g 200 mL/hr over 30 Minutes Intravenous Every 24 hours 08/13/23 2108 08/16/23 0902   08/12/23 2200  vancomycin (VANCOREADY) IVPB 500 mg/100 mL  Status:  Discontinued        500 mg 100 mL/hr over 60 Minutes Intravenous Every 12 hours 08/12/23 2030 08/12/23 2049   08/12/23 2200  vancomycin (VANCOREADY) IVPB 1250 mg/250 mL  Status:  Discontinued        1,250 mg 166.7 mL/hr over 90 Minutes Intravenous Every 24 hours 08/12/23 2049 08/16/23 1359   08/10/23 0100  vancomycin (VANCOCIN) IVPB 1000 mg/200 mL premix  Status:  Discontinued        1,000 mg 200 mL/hr over 60 Minutes Intravenous Every 24 hours 08/09/23 0108 08/09/23 1520   08/09/23 2200  vancomycin (VANCOREADY) IVPB 750 mg/150 mL  Status:  Discontinued        750 mg 150 mL/hr over 60 Minutes Intravenous Every 24 hours 08/09/23 1520 08/12/23 2030   08/09/23 0100   vancomycin (VANCOREADY) IVPB 1250 mg/250 mL        1,250 mg 166.7 mL/hr over 90 Minutes Intravenous  Once 08/09/23 0058 08/09/23 0700   08/08/23 1145  cefTRIAXone (ROCEPHIN) 2 g in sodium chloride 0.9 % 100 mL IVPB  Status:  Discontinued        2 g 200 mL/hr over 30 Minutes Intravenous Every 24 hours 08/08/23 1139 08/09/23 0104           Data Reviewed:  I have personally reviewed the following...  CBC: Recent Labs  Lab 08/23/23 0609 08/24/23 0442 08/25/23 0502 08/29/23 0530  WBC 9.3 9.3 9.5 9.0  NEUTROABS 7.1 6.7 6.9  --   HGB 8.5* 8.7* 8.5* 8.8*  HCT 25.7* 27.7* 26.6* 27.5*  MCV 94.1 97.2 95.7 95.5  PLT 587* 573* 548* 539*   Basic Metabolic Panel: Recent Labs  Lab 08/23/23 0609 08/24/23 0442 08/25/23 0502 08/29/23 0530  NA 134* 131* 134* 136  K 3.8 3.7 3.5 3.8  CL 108 102 105 105  CO2 18* 22 21* 24  GLUCOSE 98 111* 108* 100*  BUN 18 18 16 15   CREATININE 0.75 0.73 0.76 0.72  CALCIUM 8.5* 8.5* 8.5* 8.4*   GFR: Estimated Creatinine Clearance: 47.1 mL/min (by C-G formula based on SCr of 0.72 mg/dL). Liver Function Tests: Recent Labs  Lab 08/23/23 0609  AST 19  ALT 23  ALKPHOS 255*  BILITOT 0.3  PROT 5.5*  ALBUMIN 2.0*   No results for input(s): "LIPASE", "AMYLASE" in the last 168 hours. Recent Labs  Lab 08/23/23 1511  AMMONIA 26   Coagulation Profile: No results for input(s): "INR", "PROTIME" in the last 168 hours. Cardiac Enzymes: Recent Labs  Lab 08/23/23 0609  CKTOTAL 20*   BNP (last 3 results) No results for input(s): "  PROBNP" in the last 8760 hours. HbA1C: No results for input(s): "HGBA1C" in the last 72 hours. CBG: No results for input(s): "GLUCAP" in the last 168 hours.  Lipid Profile: No results for input(s): "CHOL", "HDL", "LDLCALC", "TRIG", "CHOLHDL", "LDLDIRECT" in the last 72 hours. Thyroid Function Tests: No results for input(s): "TSH", "T4TOTAL", "FREET4", "T3FREE", "THYROIDAB" in the last 72 hours. Anemia Panel: No  results for input(s): "VITAMINB12", "FOLATE", "FERRITIN", "TIBC", "IRON", "RETICCTPCT" in the last 72 hours. Most Recent Urinalysis On File:     Component Value Date/Time   COLORURINE YELLOW (A) 08/08/2023 1614   APPEARANCEUR HAZY (A) 08/08/2023 1614   APPEARANCEUR Hazy (A) 04/27/2023 1340   LABSPEC 1.016 08/08/2023 1614   LABSPEC 1.015 08/29/2012 0945   PHURINE 8.0 08/08/2023 1614   GLUCOSEU NEGATIVE 08/08/2023 1614   GLUCOSEU Negative 08/29/2012 0945   HGBUR NEGATIVE 08/08/2023 1614   BILIRUBINUR NEGATIVE 08/08/2023 1614   BILIRUBINUR Negative 04/27/2023 1340   BILIRUBINUR Negative 08/29/2012 0945   KETONESUR 20 (A) 08/08/2023 1614   PROTEINUR NEGATIVE 08/08/2023 1614   NITRITE NEGATIVE 08/08/2023 1614   LEUKOCYTESUR NEGATIVE 08/08/2023 1614   LEUKOCYTESUR Negative 08/29/2012 0945   Sepsis Labs: @LABRCNTIP (procalcitonin:4,lacticidven:4) Microbiology: No results found for this or any previous visit (from the past 240 hours).     Radiology Studies last 3 days: No results found.       Sunnie Nielsen, DO [facility] 08/29/2023, 2:04 PM    Dictation software may have been used to generate the above note. Typos may occur and escape review in typed/dictated notes. Please contact Dr Lyn Hollingshead directly for clarity if needed.  Staff may message me via secure chat in Epic  but this may not receive an immediate response,  please page me for urgent matters!  If 7PM-7AM, please contact night coverage [email or web address]

## 2023-08-29 NOTE — Progress Notes (Signed)
Physical Therapy Treatment Patient Details Name: Jocelyn Hooper MRN: [id number] DOB: 05-07-1941 Today's Date: 08/29/2023   History of Present Illness Pt is an 82 yo F presenting due to AMS, with MRSA, sepsis, acute urinary retention, acute metabolic encephalopathy, arteriosclerotic CVD, and hypokalemia. PMH significant for L5-S1 TLIF (06/09/2023), anemia, L shoulder bursitis, CAD, CSF leak, IBS, Scoliosis, Trigeminal neuralgia, and B TKR.    PT Comments  Pt ready for session.  She is assisted to EOB with mod/max a x 1.  She continues with poor sitting balance with heavy right lean.  HOB and pillows are used to help with lateral support.  She needs mod cues to help correct post lean but does need less physical assist today vs prior session.  She sits about 12 minutes with support and does do some seated AROM.  Attempted to get pt to participate in UE tasks while sitting but she is resistant due to imbalances.  Fatigued with activity and returns to supine with max a x 1.   If plan is discharge home, recommend the following: Two people to help with walking and/or transfers;A lot of help with bathing/dressing/bathroom;Assistance with cooking/housework;Direct supervision/assist for medications management;Direct supervision/assist for financial management;Assist for transportation;Help with stairs or ramp for entrance;Supervision due to cognitive status   Can travel by private vehicle        Equipment Recommendations       Recommendations for Other Services       Precautions / Restrictions Precautions Precautions: Fall Restrictions Weight Bearing Restrictions Per Provider Order: No     Mobility  Bed Mobility Overal bed mobility: Needs Assistance Bed Mobility: Supine to Sit, Sit to Supine     Supine to sit: Max assist, HOB elevated Sit to supine: Max assist        Transfers                        Ambulation/Gait                   Stairs              Wheelchair Mobility     Tilt Bed    Modified Rankin (Stroke Patients Only)       Balance Overall balance assessment: Needs assistance Sitting-balance support: Feet supported, Bilateral upper extremity supported Sitting balance-Leahy Scale: Zero   Postural control: Right lateral lean Standing balance support: Bilateral upper extremity supported Standing balance-Leahy Scale: Zero                              Cognition Arousal: Alert Behavior During Therapy: Flat affect Overall Cognitive Status: Impaired/Different from baseline                                          Exercises      General Comments        Pertinent Vitals/Pain Pain Assessment Pain Assessment: Faces Faces Pain Scale: Hurts a little bit Pain Location: L hip Pain Descriptors / Indicators: Grimacing, Discomfort, Guarding Pain Intervention(s): Limited activity within patient's tolerance, Monitored during session, Repositioned    Home Living                          Prior Function  PT Goals (current goals can now be found in the care plan section) Progress towards PT goals: Not progressing toward goals - comment    Frequency    Min 1X/week      PT Plan      Co-evaluation              AM-PAC PT "6 Clicks" Mobility   Outcome Measure  Help needed turning from your back to your side while in a flat bed without using bedrails?: A Lot Help needed moving from lying on your back to sitting on the side of a flat bed without using bedrails?: Total Help needed moving to and from a bed to a chair (including a wheelchair)?: Total Help needed standing up from a chair using your arms (e.g., wheelchair or bedside chair)?: Total Help needed to walk in hospital room?: Total Help needed climbing 3-5 steps with a railing? : Total 6 Click Score: 7    End of Session Equipment Utilized During Treatment: Gait belt Activity Tolerance: Patient  tolerated treatment well;Patient limited by fatigue Patient left: in bed;with call bell/phone within reach;with bed alarm set Nurse Communication: Mobility status PT Visit Diagnosis: Difficulty in walking, not elsewhere classified (R26.2);Muscle weakness (generalized) (M62.81);Pain Pain - Right/Left: Left Pain - part of body: Hip     Time: 6578-4696 PT Time Calculation (min) (ACUTE ONLY): 21 min  Charges:    $Therapeutic Exercise: 8-22 mins PT General Charges $$ ACUTE PT VISIT: 1 Visit                   Danielle Dess, PTA 08/29/23, 1:19 PM

## 2023-08-29 NOTE — Plan of Care (Signed)
  Problem: Education: Goal: Knowledge of General Education information will improve Description: Including pain rating scale, medication(s)/side effects and non-pharmacologic comfort measures Outcome: Progressing   Problem: Activity: Goal: Risk for activity intolerance will decrease Outcome: Progressing   Problem: Nutrition: Goal: Adequate nutrition will be maintained Outcome: Progressing   Problem: Elimination: Goal: Will not experience complications related to urinary retention Outcome: Progressing   Problem: Pain Management: Goal: General experience of comfort will improve Outcome: Progressing   Problem: Safety: Goal: Ability to remain free from injury will improve Outcome: Progressing

## 2023-08-30 DIAGNOSIS — M6281 Muscle weakness (generalized): Secondary | ICD-10-CM | POA: Diagnosis not present

## 2023-08-30 DIAGNOSIS — M4646 Discitis, unspecified, lumbar region: Secondary | ICD-10-CM | POA: Diagnosis not present

## 2023-08-30 DIAGNOSIS — D519 Vitamin B12 deficiency anemia, unspecified: Secondary | ICD-10-CM | POA: Diagnosis not present

## 2023-08-30 DIAGNOSIS — R7881 Bacteremia: Secondary | ICD-10-CM | POA: Diagnosis not present

## 2023-08-30 DIAGNOSIS — L89100 Pressure ulcer of unspecified part of back, unstageable: Secondary | ICD-10-CM | POA: Diagnosis not present

## 2023-08-30 DIAGNOSIS — R1311 Dysphagia, oral phase: Secondary | ICD-10-CM | POA: Diagnosis not present

## 2023-08-30 DIAGNOSIS — Z981 Arthrodesis status: Secondary | ICD-10-CM | POA: Diagnosis not present

## 2023-08-30 DIAGNOSIS — G253 Myoclonus: Secondary | ICD-10-CM | POA: Diagnosis not present

## 2023-08-30 DIAGNOSIS — N200 Calculus of kidney: Secondary | ICD-10-CM | POA: Diagnosis not present

## 2023-08-30 DIAGNOSIS — L89109 Pressure ulcer of unspecified part of back, unspecified stage: Secondary | ICD-10-CM | POA: Diagnosis not present

## 2023-08-30 DIAGNOSIS — A4102 Sepsis due to Methicillin resistant Staphylococcus aureus: Secondary | ICD-10-CM | POA: Diagnosis not present

## 2023-08-30 DIAGNOSIS — R262 Difficulty in walking, not elsewhere classified: Secondary | ICD-10-CM | POA: Diagnosis not present

## 2023-08-30 DIAGNOSIS — A419 Sepsis, unspecified organism: Secondary | ICD-10-CM | POA: Diagnosis not present

## 2023-08-30 DIAGNOSIS — T8140XA Infection following a procedure, unspecified, initial encounter: Secondary | ICD-10-CM | POA: Diagnosis not present

## 2023-08-30 DIAGNOSIS — Z7401 Bed confinement status: Secondary | ICD-10-CM | POA: Diagnosis not present

## 2023-08-30 DIAGNOSIS — R41841 Cognitive communication deficit: Secondary | ICD-10-CM | POA: Diagnosis not present

## 2023-08-30 DIAGNOSIS — G500 Trigeminal neuralgia: Secondary | ICD-10-CM | POA: Diagnosis not present

## 2023-08-30 DIAGNOSIS — M5030 Other cervical disc degeneration, unspecified cervical region: Secondary | ICD-10-CM | POA: Diagnosis not present

## 2023-08-30 DIAGNOSIS — K5900 Constipation, unspecified: Secondary | ICD-10-CM | POA: Diagnosis not present

## 2023-08-30 DIAGNOSIS — L98423 Non-pressure chronic ulcer of back with necrosis of muscle: Secondary | ICD-10-CM | POA: Diagnosis not present

## 2023-08-30 DIAGNOSIS — L89313 Pressure ulcer of right buttock, stage 3: Secondary | ICD-10-CM | POA: Diagnosis not present

## 2023-08-30 DIAGNOSIS — N390 Urinary tract infection, site not specified: Secondary | ICD-10-CM | POA: Diagnosis not present

## 2023-08-30 DIAGNOSIS — R5381 Other malaise: Secondary | ICD-10-CM | POA: Diagnosis not present

## 2023-08-30 DIAGNOSIS — R6889 Other general symptoms and signs: Secondary | ICD-10-CM | POA: Diagnosis not present

## 2023-08-30 DIAGNOSIS — M4317 Spondylolisthesis, lumbosacral region: Secondary | ICD-10-CM | POA: Diagnosis not present

## 2023-08-30 DIAGNOSIS — L89133 Pressure ulcer of right lower back, stage 3: Secondary | ICD-10-CM | POA: Diagnosis not present

## 2023-08-30 DIAGNOSIS — M4626 Osteomyelitis of vertebra, lumbar region: Secondary | ICD-10-CM | POA: Diagnosis not present

## 2023-08-30 DIAGNOSIS — R269 Unspecified abnormalities of gait and mobility: Secondary | ICD-10-CM | POA: Diagnosis not present

## 2023-08-30 DIAGNOSIS — R2681 Unsteadiness on feet: Secondary | ICD-10-CM | POA: Diagnosis not present

## 2023-08-30 DIAGNOSIS — G4700 Insomnia, unspecified: Secondary | ICD-10-CM | POA: Diagnosis not present

## 2023-08-30 DIAGNOSIS — I2510 Atherosclerotic heart disease of native coronary artery without angina pectoris: Secondary | ICD-10-CM | POA: Diagnosis not present

## 2023-08-30 DIAGNOSIS — E785 Hyperlipidemia, unspecified: Secondary | ICD-10-CM | POA: Diagnosis not present

## 2023-08-30 DIAGNOSIS — R338 Other retention of urine: Secondary | ICD-10-CM | POA: Diagnosis not present

## 2023-08-30 DIAGNOSIS — J309 Allergic rhinitis, unspecified: Secondary | ICD-10-CM | POA: Diagnosis not present

## 2023-08-30 DIAGNOSIS — L89152 Pressure ulcer of sacral region, stage 2: Secondary | ICD-10-CM | POA: Diagnosis not present

## 2023-08-30 DIAGNOSIS — L89893 Pressure ulcer of other site, stage 3: Secondary | ICD-10-CM | POA: Diagnosis not present

## 2023-08-30 DIAGNOSIS — R6520 Severe sepsis without septic shock: Secondary | ICD-10-CM | POA: Diagnosis not present

## 2023-08-30 DIAGNOSIS — R339 Retention of urine, unspecified: Secondary | ICD-10-CM | POA: Diagnosis not present

## 2023-08-30 DIAGNOSIS — Z79890 Hormone replacement therapy (postmenopausal): Secondary | ICD-10-CM | POA: Diagnosis not present

## 2023-08-30 DIAGNOSIS — K219 Gastro-esophageal reflux disease without esophagitis: Secondary | ICD-10-CM | POA: Diagnosis not present

## 2023-08-30 DIAGNOSIS — R0989 Other specified symptoms and signs involving the circulatory and respiratory systems: Secondary | ICD-10-CM | POA: Diagnosis not present

## 2023-08-30 DIAGNOSIS — G9341 Metabolic encephalopathy: Secondary | ICD-10-CM | POA: Diagnosis not present

## 2023-08-30 DIAGNOSIS — R110 Nausea: Secondary | ICD-10-CM | POA: Diagnosis not present

## 2023-08-30 DIAGNOSIS — R531 Weakness: Secondary | ICD-10-CM | POA: Diagnosis not present

## 2023-08-30 DIAGNOSIS — B9562 Methicillin resistant Staphylococcus aureus infection as the cause of diseases classified elsewhere: Secondary | ICD-10-CM | POA: Diagnosis not present

## 2023-08-30 DIAGNOSIS — R42 Dizziness and giddiness: Secondary | ICD-10-CM | POA: Diagnosis not present

## 2023-08-30 DIAGNOSIS — M62838 Other muscle spasm: Secondary | ICD-10-CM | POA: Diagnosis not present

## 2023-08-30 DIAGNOSIS — L89156 Pressure-induced deep tissue damage of sacral region: Secondary | ICD-10-CM | POA: Diagnosis not present

## 2023-08-30 LAB — CK: Total CK: 100 U/L (ref 38–234)

## 2023-08-30 MED ORDER — DAPTOMYCIN IV (FOR PTA / DISCHARGE USE ONLY): 500.0000 mg | INTRAVENOUS | 0 refills | Status: AC

## 2023-08-30 MED ORDER — MORPHINE SULFATE ER 15 MG PO TBCR: 15.0000 mg | EXTENDED_RELEASE_TABLET | Freq: Two times a day (BID) | ORAL | 0 refills | Status: DC

## 2023-08-30 MED ORDER — TAMSULOSIN HCL 0.4 MG PO CAPS: 0.4000 mg | ORAL_CAPSULE | Freq: Every day | ORAL | Status: AC

## 2023-08-30 MED ORDER — DAPTOMYCIN-SODIUM CHLORIDE 500-0.9 MG/50ML-% IV SOLN: 500.0000 mg | Freq: Every day | INTRAVENOUS | Status: AC

## 2023-08-30 MED ORDER — OXYCODONE-ACETAMINOPHEN 5-325 MG PO TABS: 1.0000 | ORAL_TABLET | ORAL | 0 refills | Status: AC | PRN

## 2023-08-30 NOTE — Care Management Important Message (Signed)
Important Message  Patient Details  Name: Jocelyn Hooper MRN: [id number] Date of Birth: 08/08/1941   Important Message Given:  Yes - Medicare IM     [name] [name] 08/30/2023, 2:20 PM

## 2023-08-30 NOTE — Progress Notes (Addendum)
Pharmacy Antibiotic Note  Jocelyn Hooper is a 82 y.o. female admitted on 08/08/2023 with bacteremia.  Pharmacy has been consulted for daptomycin dosing. Recently lumbar fusion in sept.  Treated for UTI in October (no cultures available, only UAs).    Today, 08/30/2023 Day # 22 of total abx. Day 15 of daptomycin. Received dapto + ceftaroline from 12/2 to 12/9 Renal: SCr stable WBC 9.5 Afebrile last 24h 11/24 blood cx: MRSA 11/25 MRI lumbar spine - no evidence of infection 11/25 MRI for Hip pain - Mild osteoarthritis of the left hip. No acute findings; Small bilateral hip joint effusions; Mild tendinosis of the bilateral gluteus medius and minimus tendons. Mild right peritrochanteric bursitis; Mild tendinosis of the bilateral hamstring tendon origins  11/26 blood cx: NG 11/29 blood cx: both sets MRSA 11/28 TEE - no vegetation 11/30 repeat MRI - concern for L4-5 discitis/osteomyelitis 12/2 blood cx: NGTD (first negative culture) 12/3 CK 16 12/9 CK 20 12/16 CK 100  Vancomycin levels: Vancomycin levels 11/27-28 Current regimen: Vancomycin 750 mg IV Q24H Preceding dose 11/27 @ 2118  Vanc pk 11/28 @ 0022: 16 ug/mL  Vanc tr 11/28 @ 1943: 8 ug/mL PK Calculations AUC: 282.4 Cmin: 7.6 T 1/2: 19.4hr  Plan: Continue daptomycin 9 mg/kg daily. OPAT completed. Plan for 6 weeks total of abx. CK  weekly, not on statin.   Height: 5\' 5"  (165.1 cm) Weight: 57.5 kg (126 lb 12.2 oz) IBW/kg (Calculated) : 57  Temp (24hrs), Avg:98.1 F (36.7 C), Min:97.7 F (36.5 C), Max:98.2 F (36.8 C)  Recent Labs  Lab 08/24/23 0442 08/25/23 0502 08/29/23 0530  WBC 9.3 9.5 9.0  CREATININE 0.73 0.76 0.72    Estimated Creatinine Clearance: 47.1 mL/min (by C-G formula based on SCr of 0.72 mg/dL).    Allergies  Allergen Reactions   Avelox [Moxifloxacin Hcl In Nacl] Other (See Comments)    Redness all over in about 5 minutes after taking   Codeine Nausea Only   Acyclovir And Related     Turn red all  over    Mirtazapine Itching    Becomes twitchy and myoclonic   Shrimp [Shellfish Allergy]     Hives     Ciprofloxacin Rash   Moxifloxacin Hives and Rash   Sucralfate Rash    Itching     Antimicrobials this admission: 11/27 ceftriaxone X 1, 11/29-12/1 11/25 vancomycin >> 12/2 12/2: daptomycin + ceftaroline >> 12/9 12/2 dapto >>    Microbiology results:  11/24 UCx:  Multiple species present, suggest re-collection See above Bcx: MRSA.    Thank you for allowing pharmacy to be a part of this patient's care.  Paschal Dopp, PharmD, BCPS Work Cell: [phone] 08/30/2023 8:14 AM

## 2023-08-30 NOTE — TOC Progression Note (Signed)
Transition of Care [facility]) - Progression Note    Patient Details  Name: Jocelyn Hooper MRN: [id number] Date of Birth: 19-Aug-1941  [facility]) CM/SW Contact  Truddie Hidden, RN Phone Number: 08/30/2023, 12:14 PM  Clinical Narrative:    Spoke with Ricky in admissions at [facility].  Patient can be accepted today. MD notified.     Expected Discharge Plan: Home w Home Health Services Barriers to Discharge: Continued Medical Work up  Expected Discharge Plan and Services                                               Social Determinants of Health (SDOH) Interventions SDOH Screenings   Food Insecurity: No Food Insecurity (08/09/2023)  Housing: Low Risk  (08/26/2023)  Transportation Needs: No Transportation Needs (08/09/2023)  Utilities: Not At Risk (08/09/2023)  Financial Resource Strain: Low Risk  (07/01/2023)   Received from [facility]  Tobacco Use: Low Risk  (08/08/2023)    Readmission Risk Interventions     No data to display

## 2023-08-30 NOTE — TOC Transition Note (Signed)
Transition of Care [facility]) - Discharge Note   Patient Details  Name: SERA TERRANOVA MRN: [id number] Date of Birth: December 18, 1940  Transition of Care [facility]) CM/SW Contact:  Truddie Hidden, RN Phone Number: 08/30/2023, 1:23 PM   Clinical Narrative:     Sherron Monday with Ricky in admissions at [facility] Per facility patient admission confirmed for today. Patient assigned room # E 12 Nurse will call report to [phone] Face sheet and medical necessity forms printed to the floor to be added to the EMS pack EMS arranged for as close to 3pm as possible.  Discharge summary and SNF transfer report sent in HUB.  Nurse, and family notified spoke with TOC signing off.    Final next level of care: Home w Home Health Services Barriers to Discharge: Continued Medical Work up   Patient Goals and CMS Choice Patient states their goals for this hospitalization and ongoing recovery are:: unknown pt has AMS          Discharge Placement                       Discharge Plan and Services Additional resources added to the After Visit Summary for                                       Social Drivers of Health (SDOH) Interventions SDOH Screenings   Food Insecurity: No Food Insecurity (08/09/2023)  Housing: Low Risk  (08/26/2023)  Transportation Needs: No Transportation Needs (08/09/2023)  Utilities: Not At Risk (08/09/2023)  Financial Resource Strain: Low Risk  (07/01/2023)   Received from [facility]  Tobacco Use: Low Risk  (08/08/2023)     Readmission Risk Interventions     No data to display

## 2023-08-30 NOTE — Consult Note (Signed)
[facility] Liaison Note  08/30/2023  Jocelyn Hooper 09/26/1940 [id number]  Location: RN Hospital Liaison screened the patient remotely at [facility].  Insurance: [employer]   Jocelyn Hooper is a 82 y.o. female who is a Primary Care Patient of Danella Penton, MD The patient was screened for  readmission hospitalization with noted medium risk score for unplanned readmission risk with 1 IP in 6 months.  The patient was assessed for potential Care Management service needs for post hospital transition for care coordination. Review of patient's electronic medical record reveals patient was admitted for Sepsis with encephalopathy. Pt recommended for SNF and will transition today. Facility will address pt's ongoing needs.  [facility] Care Management/Population Health does not replace or interfere with any arrangements made by the Inpatient Transition of Care team.   For questions contact:   Elliot Cousin, RN, [facility] Liaison [place]   [facility], Population Health Office Hours MTWF  8:00 am-6:00 pm Direct Dial: [phone] mobile [phone] [Office toll free line] Office Hours are M-F 8:30 - 5 pm [email or web address]

## 2023-08-30 NOTE — Discharge Summary (Addendum)
Jocelyn Hooper MWU:[id number] DOB: Sep 10, 1941 DOA: 08/08/2023  PCP: Danella Penton, MD  Admit date: 08/08/2023 Discharge date: 08/30/2023  Time spent: 35 minutes  Recommendations for Outpatient Follow-up:  Pcp f/u (need to call and schedule) ID f/u 1/7 Neurosurgery f/u (need to call and schedule) Urology f/u (need to call and schedule)    Discharge Diagnoses:  Principal Problem:   Sepsis with encephalopathy without septic shock (HCC) Active Problems:   Acute urinary retention   Altered mental status   Mixed hyperlipidemia   GERD (gastroesophageal reflux disease)   ASCVD (arteriosclerotic cardiovascular disease)   B12 deficiency   Trigeminal neuralgia syndrome   Spondylolisthesis of lumbosacral region   MRSA bacteremia   Discitis of lumbar region   Inadequate pain control   Myoclonus   Discharge Condition: stable  Diet recommendation: heart healthy  Filed Weights   08/09/23 0703 08/11/23 0722 08/23/23 0400  Weight: 56 kg 56 kg 57.5 kg    History of present illness:  From admission h and p Jocelyn Hooper is a 82 y.o. female with medical history significant of iron deficiency anemia, B12 deficiency, arteriosclerotic disease, nephrolithiasis, scoliosis s/p recent intervention (06/09/2023), who presents to the ED due to altered mental status.   History obtained from both patient and her family at bedside.  Jocelyn Hooper states that she experiencing worsening of her left hip pain and has been told in the past she has bursitis.  Her husband at bedside states that yesterday, she climbed the stairs numerous times in preparation for the holidays.  Jocelyn Hooper also endorses bladder fullness and feels as though she is unable to urinate.  She denies any dysuria, frequency, urgency or abdominal pain though.  She denies any cough, shortness of breath, chest pain, palpitations, lower extremity swelling.  Jocelyn Hooper states that yesterday, she was in her usual state of health but upon awakening  today, he noted that she was more confused and had a fever.  He notes that she has frequent UTIs.   [name] at bedside states that patient has had occasional memory issues and repeats herself multiple times but it seems much worse today.  Hospital Course:   Sepsis due to MRSA bacteremia/L4-5 osteomyelitis/discitis Sepsis POA, as evidenced by fevers, tachycardia, tachypnea, leukocytosis - pt presented w/ severe left hip pain and is status post recent L5-S1 decompression, instrumentation and fusion on 06/09/2023.  Blood cultures +MRSA in all 4 bottles Initial MRI of the lumbar spine negative but repeat with discitis/osteomyelitis at L4-L5 TEE negative for endocarditis on 11/28 Based on persistent pain and minimal ability to participate in therapy, repeated lumbosacral MRI on 12/6 with no significant change from priorInitial repeat Bcx from 11/26 no growth & final Repeat blood cultures 11/29 again +MRSA  Repeat blood cultures again on 12/2 negative x 5 days S/p PICC placement  ID is following IV Daptomycin through 09/27/23   OPAT orders per ID: Discharge antibiotics: Daptomycin 500mg  IV every 24 hrs For 6 weeks starting date 08/16/23 End Date: 09/27/23   [facility] Care Per Protocol:   Labs weekly while on IV antibiotics: _X_ CBC with differential X CK _X_ CMP _X_ CRP X__ ESR   WATCH for Rhabdomyolysis, eosinophilia, pneumonia while on Daptomycin _X_ Please pull PIC at completion of IV antibiotics Fax weekly lab results  promptly to [phone] Clinic Follow Up Appt: 09/21/23 at 10.45 AM Call [phone] with abnormal labs or any other questions    Toxic encephalopathy - resolved  Constipation likely opiate induced resolved   Acute urinary retention Significant bladder distention noted on CT imaging with patient's inability to urinate afterwards  failed TOV twice here, foley replaced prior to discharge - will need outpatient urology f/u   ASCVD (arteriosclerotic  cardiovascular disease) Continue home aspirin and metoprolol   Thrombocytosis Likely reactive   Ambulatory dysfunction Infection has led to significant weakness She is likely to benefit from SNF rehab    Procedures: TEE  Consultations: ID, cardiology  Discharge Exam: Vitals:   08/30/23 0732 08/30/23 1233  BP: (!) 112/59 103/62  Pulse: 74 81  Resp:    Temp: 98.2 F (36.8 C) 98.1 F (36.7 C)  SpO2: 98% 99%    General: NAD Cardiovascular: RRR Respiratory: CTAB  Discharge Instructions   Discharge Instructions     Change dressing on IV access line weekly and PRN   Complete by: As directed    Diet - low sodium heart healthy   Complete by: As directed    Discharge wound care:   Complete by: As directed    Standard pressure injury wound care   Flush IV access with Sodium Chloride 0.9% and Heparin 10 units/ml or 100 units/ml   Complete by: As directed    Home infusion instructions - [facility]   Complete by: As directed    Instructions: Flush IV access with Sodium Chloride 0.9% and Heparin 10units/ml or 100units/ml   Change dressing on IV access line: Weekly and PRN   Instructions Cath Flo 2mg : Administer for PICC Line occlusion and as ordered by physician for other access device   [facility] pharmacist to adjust dose for: Vancomycin, Aminoglycosides and other anti-infective therapies as requested by physician   Increase activity slowly   Complete by: As directed       Allergies as of 08/30/2023       Reactions   Avelox [moxifloxacin Hcl In Nacl] Other (See Comments)   Redness all over in about 5 minutes after taking   Codeine Nausea Only   Acyclovir And Related    Turn red all over    Mirtazapine Itching   Becomes twitchy and myoclonic   Shrimp [shellfish Allergy]    Hives    Ciprofloxacin Rash   Moxifloxacin Hives, Rash   Sucralfate Rash   Itching         Medication List     STOP taking these medications    oxybutynin  5 MG tablet Commonly known as: DITROPAN       TAKE these medications    ascorbic acid 500 MG tablet Commonly known as: VITAMIN C Take 1,000 mg by mouth 2 (two) times daily.   aspirin EC 81 MG tablet Take 81 mg by mouth daily.   Calcium Carbonate-Vitamin D 600-400 MG-UNIT tablet Take 1 tablet by mouth 2 (two) times daily.   cetirizine 10 MG tablet Commonly known as: ZYRTEC Take 10 mg by mouth daily.   cyclobenzaprine 5 MG tablet Commonly known as: FLEXERIL Take 1 tablet (5 mg total) by mouth 3 (three) times daily as needed for muscle spasms.   daptomycin 500-0.9 MG/50ML-% Soln Commonly known as: CUBICIN Inject 50 mLs (500 mg total) into the vein daily at 2 PM.   docusate sodium 100 MG capsule Commonly known as: COLACE Take 1 capsule (100 mg total) by mouth 2 (two) times daily.   estradiol 0.5 MG tablet Commonly known as: ESTRACE Take 0.5 mg by mouth daily.   fluticasone 50 MCG/ACT nasal  spray Commonly known as: FLONASE Place 1 spray into both nostrils in the morning and at bedtime.   gabapentin 300 MG capsule Commonly known as: NEURONTIN Take 300 mg by mouth at bedtime.   HYDROcodone-acetaminophen 5-325 MG tablet Commonly known as: NORCO/VICODIN Take by mouth.   lansoprazole 30 MG capsule Commonly known as: PREVACID Take 30 mg by mouth daily.   Melatonin 10 MG Tabs Take 10 mg by mouth at bedtime.   metoCLOPramide 5 MG tablet Commonly known as: REGLAN Take 5 mg by mouth every 8 (eight) hours as needed for vomiting or nausea.   metoprolol succinate 25 MG 24 hr tablet Commonly known as: TOPROL-XL Take 1 tablet by mouth daily.   morphine 15 MG 12 hr tablet Commonly known as: MS CONTIN Take 1 tablet (15 mg total) by mouth every 12 (twelve) hours.   oxyCODONE-acetaminophen 5-325 MG tablet Commonly known as: PERCOCET/ROXICET Take 1 tablet by mouth every 4 (four) hours as needed for moderate pain (pain score 4-6).   tamsulosin 0.4 MG Caps  capsule Commonly known as: FLOMAX Take 1 capsule (0.4 mg total) by mouth daily after supper.   topiramate 50 MG tablet Commonly known as: TOPAMAX Take 150 mg by mouth daily.               Discharge Care Instructions  (From admission, onward)           Start     Ordered   08/30/23 0000  Change dressing on IV access line weekly and PRN  (Home infusion instructions - [facility] )        08/30/23 1314   08/30/23 0000  Discharge wound care:       Comments: Standard pressure injury wound care   08/30/23 1314           Allergies  Allergen Reactions   Avelox [Moxifloxacin Hcl In Nacl] Other (See Comments)    Redness all over in about 5 minutes after taking   Codeine Nausea Only   Acyclovir And Related     Turn red all over    Mirtazapine Itching    Becomes twitchy and myoclonic   Shrimp [Shellfish Allergy]     Hives     Ciprofloxacin Rash   Moxifloxacin Hives and Rash   Sucralfate Rash    Itching     Follow-up Information     Danella Penton, MD Follow up.   Specialty: Internal Medicine Contact information: [address] [place] [address] [phone]         Your neurosurgeon Follow up.          Lynn Ito, MD Follow up.   Specialty: Infectious Diseases Why: 09/20/2022, please call to confirm Contact information: [address] [place] [address] [phone]                  The results of significant diagnostics from this hospitalization (including imaging, microbiology, ancillary and laboratory) are listed below for reference.    Significant Diagnostic Studies: CT HEAD WO CONTRAST ( ) Result Date: 08/23/2023 CLINICAL DATA:  Provided history: Mental status change, unknown cause. Sepsis with encephalopathy without septic shock. EXAM: CT HEAD WITHOUT CONTRAST TECHNIQUE: Contiguous axial images were obtained from the base of the skull through the vertex without intravenous contrast. RADIATION  DOSE REDUCTION: This exam was performed according to the departmental dose-optimization program which includes automated exposure control, adjustment of the mA and/or kV according to patient size and/or use of iterative  reconstruction technique. COMPARISON:  Brain MRI 08/13/2023.  Head CT 08/08/2023. FINDINGS: Brain: Generalized cerebral atrophy. Patchy and ill-defined hypoattenuation within the cerebral white matter, nonspecific but compatible with moderate chronic small vessel ischemic disease. There is no acute intracranial hemorrhage. No demarcated cortical infarct. No extra-axial fluid collection. No evidence of an intracranial mass. No midline shift. Vascular: No hyperdense vessel.  Atherosclerotic calcifications. Skull: No calvarial fracture or aggressive osseous lesion. Sinuses/Orbits: No mass or acute finding within the imaged orbits. Postsurgical appearance of the paranasal sinuses. Mucosal thickening within the right greater than left maxillary sinuses (with associated prominent chronic provided reactive osteitis). IMPRESSION: 1.  No evidence of an acute intracranial abnormality. 2. Parenchymal atrophy and chronic small vessel ischemic disease. Electronically Signed   By: Jackey Loge D.O.   On: 08/23/2023 14:45   [place] EKG SITE RITE Result Date: 08/22/2023 If Site Rite image not attached, placement could not be confirmed due to current cardiac rhythm.  MR Lumbar Spine W Wo Contrast Result Date: 08/20/2023 CLINICAL DATA:  Low back pain, infection suspected EXAM: MRI LUMBAR SPINE WITHOUT AND WITH CONTRAST TECHNIQUE: Multiplanar and multiecho pulse sequences of the lumbar spine were obtained without and with intravenous contrast. CONTRAST:  6mL GADAVIST GADOBUTROL 1 MMOL/ML IV SOLN COMPARISON:  08/14/2023 FINDINGS: Segmentation:  Standard. Alignment:  Levoscoliosis with apex at L3 Vertebrae: L5-S1 PLIF. Mild progression of height loss at L5 with persistent bone marrow edema. Much of the area is obscured  by susceptibility artifacts related spinal hardware. Mildly increased enhancement at the inferior endplate of L4. Otherwise, no evidence of progressive discitis-osteomyelitis. No epidural abscess. Conus medullaris and cauda equina: Conus extends to the L1 level. Conus and cauda equina appear normal. Paraspinal and other soft tissues: Negative Disc levels: There is detailed assessment of the disc levels on 08/14/2023. Since that time, there has been no change. IMPRESSION: Mild progression of height loss at L5 with persistent bone marrow edema. Mildly increased enhancement at the inferior endplate of L4, nonspecific. While the findings may indicate discitis-osteomyelitis, there has been very little change. Electronically Signed   By: Deatra Robinson M.D.   On: 08/20/2023 23:08   MR Lumbar Spine W Wo Contrast Result Date: 08/14/2023 CLINICAL DATA:  MRSA bacteremia. Low back pain. Lumbar spine surgery weeks ago. EXAM: MRI LUMBAR SPINE WITHOUT AND WITH CONTRAST TECHNIQUE: Multiplanar and multiecho pulse sequences of the lumbar spine were obtained without and with intravenous contrast. CONTRAST:  5mL GADAVIST GADOBUTROL 1 MMOL/ML IV SOLN COMPARISON:  MRI of lumbar spine without contrast 08/09/2023. Intraoperative lumbar spine radiographs 06/09/2023. FINDINGS: Segmentation: 5 non rib-bearing lumbar type vertebral bodies are present. The lowest fully formed vertebral body is L5. Alignment: Slight degenerative retrolisthesis is present at L2-3 and L3-4. Lumbar lordosis is preserved. Levoconvex curvature is centered at L3. Vertebrae: Type 2 Modic changes are present on the right at L2-3 and L3-4. Marrow signal is somewhat distorted about the hardware at L5 and S1. Partial collapse of the L5 vertebral body is noted, more prominently on the left. Edema and enhancement is present in the L5 vertebral body and anterior aspect of the L4 vertebral body on the right. Fluid and mild enhancement is present within the L4-5 disc space.  Abnormal enhancement is present in the upper lumbar spine. Conus medullaris and cauda equina: Conus extends to the T12-L1 level. Conus and cauda equina appear normal. Paraspinal and other soft tissues: Edematous paraspinous soft tissues are present at the L5 level. Disc levels: L1-2: Nerve roots are crowded  on the right. No significant disc protrusion or stenosis is present. L2-3: Nerve roots are crowded on the right. No focal disc protrusion or stenosis is present. L3-4: Asymmetric right-sided facet hypertrophy is present. A rightward disc protrusion is present. Central canal is patent. Moderate right foraminal stenosis is present. L4-5: Asymmetric left-sided facet hypertrophy is present. Moderate left foraminal stenosis is present. L5-S1: The left foramen is obscured by hardware. No residual or recurrent stenosis is present. Left laminectomy is noted. IMPRESSION: 1. Partial collapse of the L5 vertebral body, more prominently on the left. 2. Edema and enhancement in the L5 vertebral body and anterior aspect of the L4 vertebral body on the right. Fluid and mild enhancement is present within the L4-5 disc space. Findings are concerning for discitis/osteomyelitis. 3. Edematous paraspinous soft tissues at the L5 level. 4. Moderate right foraminal stenosis at L3-4 secondary to asymmetric right-sided facet hypertrophy and a rightward disc protrusion. 5. Moderate left foraminal stenosis at L4-5 secondary to asymmetric left-sided facet hypertrophy. 6. Nerve roots are crowded on the right at L1-2 and L2-3 without significant disc protrusion or stenosis. Electronically Signed   By: Marin Roberts M.D.   On: 08/14/2023 17:09   MR BRAIN WO CONTRAST Result Date: 08/13/2023 CLINICAL DATA:  Meningitis/CNS infection suspected. MRSA Bacteremia with persistent fevers and unknown source. EXAM: MRI HEAD WITHOUT CONTRAST TECHNIQUE: Multiplanar, multiecho pulse sequences of the brain and surrounding structures were obtained  without intravenous contrast. COMPARISON:  Head CT 08/08/2023 and MRI 02/07/2016 FINDINGS: Brain: There is no evidence of an acute infarct, intracranial hemorrhage, mass, midline shift, or extra-axial fluid collection. Patchy T2 hyperintensities in the cerebral white matter have progressed from the prior MRI and are nonspecific but compatible with moderate chronic small vessel ischemic disease. There is mild cerebral atrophy. No abnormal subarachnoid space signal suggestive of meningitis is evident. Vascular: Major intracranial vascular flow voids are preserved. Skull and upper cervical spine: Unremarkable bone marrow signal. Sinuses/Orbits: Bilateral cataract extraction. Prior functional endoscopic sinus surgery. Trace left mastoid fluid. Other: None. IMPRESSION: 1. No acute intracranial abnormality. 2. Moderate chronic small vessel ischemic disease. Electronically Signed   By: Sebastian Ache M.D.   On: 08/13/2023 15:30   DG Chest Port 1 View Result Date: 08/12/2023 CLINICAL DATA:  [id number], [id number] with fever and basal crackles. EXAM: PORTABLE CHEST 1 VIEW COMPARISON:  Portable chest 08/10/2023 FINDINGS: 9:10 p.m. Cardiac size is normal. Stable mediastinum with tortuous and ectatic aorta with atherosclerosis. The visualized lungs are clear. The right lower lung field is obscured by the chronically elevated right hemidiaphragm. There is no substantial pleural effusion. Thoracic cage is intact. Overlying monitor wires. IMPRESSION: No evidence of acute chest disease, but there is a chronically elevated right hemidiaphragm with limited view of the right lower lung field. Aortic atherosclerosis and uncoiling. Electronically Signed   By: Almira Bar M.D.   On: 08/12/2023 21:24   ECHOCARDIOGRAM COMPLETE Result Date: 08/12/2023    ECHOCARDIOGRAM REPORT   Patient Name:   JAMYA POLSINELLI Date of Exam: 08/12/2023 Medical Rec #:  [id number]    Height:       65.0 in Accession #:    [id number]   Weight:       123.5 lb Date of  Birth:  09/14/1941    BSA:          1.611 m Patient Age:    82 years     BP:           142/78 mmHg Patient  Gender: F            HR:           94 bpm. Exam Location:  [facility] Procedure: 2D Echo, Color Doppler and Cardiac Doppler Indications:     Bacteremia R78.81  History:         Patient has prior history of Echocardiogram examinations. CAD;                  Signs/Symptoms:Chest Pain.  Sonographer:     Neysa Bonito Roar Referring Phys:  [id number] AGBATA Diagnosing Phys: Yvonne Kendall MD IMPRESSIONS  1. Left ventricular ejection fraction, by estimation, is 55 to 60%. The left ventricle has normal function. The left ventricle has no regional wall motion abnormalities. There is mild left ventricular hypertrophy. Left ventricular diastolic parameters are consistent with Grade I diastolic dysfunction (impaired relaxation).  2. Right ventricular systolic function is normal. The right ventricular size is normal. There is normal pulmonary artery systolic pressure.  3. The mitral valve is normal in structure. Mild to moderate mitral valve regurgitation. No evidence of mitral stenosis.  4. The aortic valve was not well visualized. Aortic valve regurgitation is not visualized. No aortic stenosis is present.  5. There is borderline dilatation of the ascending aorta, measuring 36 mm.  6. The inferior vena cava is normal in size with greater than 50% respiratory variability, suggesting right atrial pressure of 3 mmHg. FINDINGS  Left Ventricle: Left ventricular ejection fraction, by estimation, is 55 to 60%. The left ventricle has normal function. The left ventricle has no regional wall motion abnormalities. The left ventricular internal cavity size was normal in size. There is  mild left ventricular hypertrophy. Left ventricular diastolic parameters are consistent with Grade I diastolic dysfunction (impaired relaxation). Right Ventricle: The right ventricular size is normal. No increase in right ventricular wall thickness. Right  ventricular systolic function is normal. There is normal pulmonary artery systolic pressure. The tricuspid regurgitant velocity is 2.28 m/s, and  with an assumed right atrial pressure of 3 mmHg, the estimated right ventricular systolic pressure is 23.8 mmHg. Left Atrium: Left atrial size was normal in size. Right Atrium: Right atrial size was normal in size. Pericardium: There is no evidence of pericardial effusion. Mitral Valve: The mitral valve is normal in structure. Mild to moderate mitral valve regurgitation. No evidence of mitral valve stenosis. MV peak gradient, 6.2 mmHg. The mean mitral valve gradient is 3.0 mmHg. Tricuspid Valve: The tricuspid valve is grossly normal. Tricuspid valve regurgitation is trivial. Aortic Valve: The aortic valve was not well visualized. Aortic valve regurgitation is not visualized. No aortic stenosis is present. Aortic valve mean gradient measures 3.0 mmHg. Aortic valve peak gradient measures 5.3 mmHg. Aortic valve area, by VTI measures 2.18 cm. Pulmonic Valve: The pulmonic valve was grossly normal. Pulmonic valve regurgitation is not visualized. No evidence of pulmonic stenosis. Aorta: The aortic root is normal in size and structure. There is borderline dilatation of the ascending aorta, measuring 36 mm. Pulmonary Artery: The pulmonary artery is of normal size. Venous: The inferior vena cava is normal in size with greater than 50% respiratory variability, suggesting right atrial pressure of 3 mmHg. IAS/Shunts: No atrial level shunt detected by color flow Doppler.  LEFT VENTRICLE PLAX 2D LVIDd:         3.90 cm   Diastology LV PW:         1.10 cm   LV e' medial:    7.29 cm/s LV IVS:  1.10 cm   LV E/e' medial:  9.7 LVOT diam:     1.90 cm   LV e' lateral:   7.18 cm/s LV SV:         44        LV E/e' lateral: 9.8 LV SV Index:   27 LVOT Area:     2.84 cm  RIGHT VENTRICLE RV Basal diam:  3.10 cm RV Mid diam:    2.90 cm RV S prime:     11.10 cm/s TAPSE (M-mode): 2.2 cm LEFT  ATRIUM             Index        RIGHT ATRIUM          Index LA diam:        3.80 cm 2.36 cm/m   RA Area:     9.36 cm LA Vol (A2C):   40.4 ml 25.07 ml/m  RA Volume:   14.80 ml 9.18 ml/m LA Vol (A4C):   51.9 ml 32.21 ml/m LA Biplane Vol: 48.6 ml 30.16 ml/m  AORTIC VALVE                    PULMONIC VALVE AV Area (Vmax):    2.32 cm     PV Vmax:        0.87 m/s AV Area (Vmean):   2.29 cm     PV Peak grad:   3.0 mmHg AV Area (VTI):     2.18 cm     RVOT Peak grad: 2 mmHg AV Vmax:           115.00 cm/s AV Vmean:          79.900 cm/s AV VTI:            0.202 m AV Peak Grad:      5.3 mmHg AV Mean Grad:      3.0 mmHg LVOT Vmax:         94.20 cm/s LVOT Vmean:        64.500 cm/s LVOT VTI:          0.155 m LVOT/AV VTI ratio: 0.77  AORTA Ao Root diam: 2.70 cm Ao Asc diam:  3.60 cm MITRAL VALVE                TRICUSPID VALVE MV Area (PHT): 5.13 cm     TR Peak grad:   20.8 mmHg MV Area VTI:   2.34 cm     TR Vmax:        228.00 cm/s MV Peak grad:  6.2 mmHg MV Mean grad:  3.0 mmHg     SHUNTS MV Vmax:       1.24 m/s     Systemic VTI:  0.16 m MV Vmean:      73.8 cm/s    Systemic Diam: 1.90 cm MV Decel Time: 148 msec MV E velocity: 70.70 cm/s MV A velocity: 116.00 cm/s MV E/A ratio:  0.61 MV A Prime:    19.6 cm/s Yvonne Kendall MD Electronically signed by Yvonne Kendall MD Signature Date/Time: 08/12/2023/1:04:15 PM    Final    ECHO TEE Result Date: 08/12/2023    TRANSESOPHOGEAL ECHO REPORT   Patient Name:   VERLA BALINGIT Date of Exam: 08/11/2023 Medical Rec #:  [id number]    Height:       65.0 in Accession #:    [id number]   Weight:       123.5 lb Date of Birth:  1941/06/08    BSA:          1.611 m Patient Age:    82 years     BP:           109/51 mmHg Patient Gender: F            HR:           106 bpm. Exam Location:  [facility] Procedure: Transesophageal Echo, Cardiac Doppler, Color Doppler and Saline            Contrast Bubble Study Indications:     Endocarditis  History:         Patient has prior history of Echocardiogram  examinations, most                  recent 10/07/2016. Signs/Symptoms:Chest Pain. Headache.  Sonographer:     Cristela Blue Referring Phys:  [id number] CADENCE H FURTH Diagnosing Phys: Julien Nordmann MD PROCEDURE: After discussion of the risks and benefits of a TEE, an informed consent was obtained. TEE procedure time was 30 minutes. The transesophogeal probe was passed without difficulty through the esophogus of the patient. Imaged were obtained with the patient in a left lateral decubitus position. Local oropharyngeal anesthetic was provided with Cetacaine and viscous lidocaine. Sedation performed by performing physician. Image quality was excellent. The patient's vital signs; including heart rate, blood pressure, and oxygen saturation; remained stable throughout the procedure. The patient developed no complications during the procedure.  IMPRESSIONS  1. Left ventricular ejection fraction, by estimation, is 60 to 65%. The left ventricle has normal function. The left ventricle has no regional wall motion abnormalities.  2. Right ventricular systolic function is normal. The right ventricular size is normal.  3. No left atrial/left atrial appendage thrombus was detected.  4. The mitral valve is normal in structure. Mild mitral valve regurgitation. No evidence of mitral stenosis.  5. The aortic valve is tricuspid. Aortic valve regurgitation is not visualized. No aortic stenosis is present.  6. The inferior vena cava is normal in size with greater than 50% respiratory variability, suggesting right atrial pressure of 3 mmHg.  7. Agitated saline contrast bubble study was negative, with no evidence of any interatrial shunt. Conclusion(s)/Recommendation(s): Normal biventricular function without evidence of hemodynamically significant valvular heart disease. FINDINGS  Left Ventricle: Left ventricular ejection fraction, by estimation, is 60 to 65%. The left ventricle has normal function. The left ventricle has no regional wall  motion abnormalities. The left ventricular internal cavity size was normal in size. There is  no left ventricular hypertrophy. Right Ventricle: The right ventricular size is normal. No increase in right ventricular wall thickness. Right ventricular systolic function is normal. Left Atrium: Left atrial size was normal in size. No left atrial/left atrial appendage thrombus was detected. Right Atrium: Right atrial size was normal in size. Pericardium: There is no evidence of pericardial effusion. Mitral Valve: The mitral valve is normal in structure. Mild mitral valve regurgitation. No evidence of mitral valve stenosis. There is no evidence of mitral valve vegetation. Tricuspid Valve: The tricuspid valve is normal in structure. Tricuspid valve regurgitation is mild . No evidence of tricuspid stenosis. There is no evidence of tricuspid valve vegetation. Aortic Valve: The aortic valve is tricuspid. Aortic valve regurgitation is not visualized. No aortic stenosis is present. There is no evidence of aortic valve vegetation. Pulmonic Valve: The pulmonic valve was normal in structure. Pulmonic valve regurgitation is not visualized. No evidence of pulmonic stenosis. There is no evidence of  pulmonic valve vegetation. Aorta: The aortic root is normal in size and structure. Venous: The inferior vena cava is normal in size with greater than 50% respiratory variability, suggesting right atrial pressure of 3 mmHg. IAS/Shunts: No atrial level shunt detected by color flow Doppler. Agitated saline contrast was given intravenously to evaluate for intracardiac shunting. Agitated saline contrast bubble study was negative, with no evidence of any interatrial shunt. There  is no evidence of a patent foramen ovale. There is no evidence of an atrial septal defect. Julien Nordmann MD Electronically signed by Julien Nordmann MD Signature Date/Time: 08/12/2023/10:36:07 AM    Final    DG Chest Port 1 View Result Date: 08/10/2023 CLINICAL DATA:   Shortness of breath.  Altered mental status. EXAM: PORTABLE CHEST 1 VIEW COMPARISON:  08/08/2023 FINDINGS: Again noted is mild elevation of the right hemidiaphragm. Linear densities in the right chest are suggestive for chronic changes or atelectasis. Heart size is normal. No large areas of airspace disease or lung consolidation. Negative for a pneumothorax. Surgical clips in the right upper abdomen. IMPRESSION: 1. No acute cardiopulmonary disease. 2. Atelectasis or chronic changes in the right lung. Electronically Signed   By: Richarda Overlie M.D.   On: 08/10/2023 12:41   MR HIP LEFT WO CONTRAST Result Date: 08/09/2023 CLINICAL DATA:  Chronic left hip pain EXAM: MR OF THE LEFT HIP WITHOUT CONTRAST TECHNIQUE: Multiplanar, multisequence MR imaging was performed. No intravenous contrast was administered. COMPARISON:  X-ray 08/09/2023 FINDINGS: Bones: No acute fracture. No dislocation. No femoral head avascular necrosis. Bony pelvis intact without diastasis. SI joints within normal limits. Mild-moderate arthropathy of the pubic symphysis. Susceptibility artifact related to prior lumbosacral fusion. Degenerative disc disease of the included lumbar spine. No bone marrow edema. No marrow replacing bone lesion. Articular cartilage and labrum Articular cartilage: Mild chondral thinning and surface irregularity of the anterosuperior aspect of the left hip. No subchondral marrow signal changes. Labrum:  Grossly intact.  No paralabral cyst. Joint or bursal effusion Joint effusion:  Small bilateral hip joint effusions. Bursae: Small volume right peritrochanteric bursal fluid. Muscles and tendons Muscles and tendons: Mild tendinosis of the bilateral gluteus medius and minimus tendons. Mild tendinosis of the bilateral hamstring tendon origins. The iliopsoas, rectus femoris, and adductor tendons appear intact without tear or significant tendinosis. Normal muscle bulk and signal intensity without edema, atrophy, or fatty  infiltration. Other findings Miscellaneous: No soft tissue edema or fluid collection. No inguinal lymphadenopathy. Sigmoid diverticulosis. IMPRESSION: 1. Mild osteoarthritis of the left hip.  No acute findings. 2. Small bilateral hip joint effusions. 3. Mild tendinosis of the bilateral gluteus medius and minimus tendons. Mild right peritrochanteric bursitis. 4. Mild tendinosis of the bilateral hamstring tendon origins. Electronically Signed   By: Duanne Guess D.O.   On: 08/09/2023 16:14   MR LUMBAR SPINE WO CONTRAST Result Date: 08/09/2023 CLINICAL DATA:  Myelopathy, acute, lumbar spine EXAM: MRI LUMBAR SPINE WITHOUT CONTRAST TECHNIQUE: Multiplanar, multisequence MR imaging of the lumbar spine was performed. No intravenous contrast was administered. COMPARISON:  Same day CT abdomen and pelvis, lumbar spine MRI 01/11/2023 FINDINGS: Segmentation:  Standard. Alignment: Levocurvature of the lumbar spine centered at the L2 vertebral body level Vertebrae: No evidence of discitis, or bone lesion. Status post L5-S1 spinal fusion. There is a superior endplate compression deformity at L5 that is unchanged from recent prior CT abdomen and pelvis dated 08/08/2023 Conus medullaris and cauda equina: Conus extends to the L1 level. Conus and cauda equina appear normal. Paraspinal and  other soft tissues: Negative. Disc levels: T12-L1: From disc bulge. No spinal canal narrowing. Significant neural foraminal narrowing. L1-L2: Eccentric right disc bulge. No spinal canal narrowing. No neural foraminal narrowing. L2-L3: Circumferential disc bulge. Mild bilateral facet degenerative change. No spinal canal narrowing. No significant neural foraminal narrowing. L3-L4: Eccentric left disc bulge. Moderate bilateral facet degenerative change. Moderate right and mild left neural foraminal narrowing. No significant spinal canal narrowing. L4-L5: Eccentric left disc bulge with obliteration of the left lateral recess. Mild overall spinal  canal narrowing. Moderate to severe bilateral facet degenerative change. Severe left and moderate right neural foraminal narrowing. L5-S1: Fused level. Eccentric left disc bulge. There is obliteration of the left lateral recess. Severe left and moderate right neural foraminal narrowing. Mild overall spinal canal narrowing. IMPRESSION: 1. Status post L5-S1 spinal fusion with a superior endplate compression deformity of the L5 level that is unchanged from 08/08/2023. 2. Eccentric left disc bulges at L4-L5 and L5-S1 with obliteration of the left lateral recesses and severe left neural foraminal narrowing at these levels. Findings have progressed compared to lumbar spine MRI dated 01/11/2023 Electronically Signed   By: Lorenza Cambridge M.D.   On: 08/09/2023 12:39   DG HIP UNILAT W OR W/O PELVIS 2-3 VIEWS LEFT Result Date: 08/09/2023 CLINICAL DATA:  Chronic left hip pain.  No known injury. EXAM: DG HIP (WITH OR WITHOUT PELVIS) 2-3V LEFT COMPARISON:  CT abdomen and pelvis 02/11/2022 and 08/08/2023 FINDINGS: There is diffuse decreased bone mineralization. Moderate pubic symphysis joint space narrowing, subchondral sclerosis, and subchondral cystic changes similar to prior. Mild bilateral superior femoroacetabular joint space narrowing. Normal morphology of the left femoral head-neck junction without CAM-type bump deformity. No acute fracture is seen. No dislocation. Severe left L5-S1 disc space narrowing. New L4-5 bilateral transpedicular rod and screw fusion hardware compared to 02/11/2022, unchanged from 08/08/2023 CT yesterday. IMPRESSION: 1. Mild bilateral femoroacetabular osteoarthritis. 2. Moderate pubic symphysis osteoarthritis. Electronically Signed   By: Neita Garnet M.D.   On: 08/09/2023 12:10   CT ABDOMEN PELVIS W CONTRAST Result Date: 08/08/2023 CLINICAL DATA:  Sepsis.  Fever.  Foul-smelling urine. EXAM: CT ABDOMEN AND PELVIS WITH CONTRAST TECHNIQUE: Multidetector CT imaging of the abdomen and pelvis was  performed using the standard protocol following bolus administration of intravenous contrast. RADIATION DOSE REDUCTION: This exam was performed according to the departmental dose-optimization program which includes automated exposure control, adjustment of the mA and/or kV according to patient size and/or use of iterative reconstruction technique. CONTRAST:  75mL OMNIPAQUE IOHEXOL 300 MG/ML  SOLN COMPARISON:  CT scan renal stone protocol from 02/11/2022. FINDINGS: Lower chest: There are dependent changes in the visualized lung bases. No overt consolidation. No pleural effusion. The heart is normal in size. No pericardial effusion. Hepatobiliary: The liver is normal in size. Non-cirrhotic configuration. No suspicious mass. No intrahepatic bile duct dilation. There is mild prominence of the extrahepatic bile duct, most likely due to post cholecystectomy status. Gallbladder is surgically absent. Pancreas: Unremarkable. No pancreatic ductal dilatation or surrounding inflammatory changes. Spleen: Within normal limits. No focal lesion. Adrenals/Urinary Tract: Adrenal glands are unremarkable. No suspicious renal mass. There is a 7 mm nonobstructing calculus in the right kidney interpolar region and a 3 mm calculus in the left kidney interpolar region. No ureterolithiasis on either side. No obstructive uropathy. Urinary bladder is distended. No perivesical fat stranding. No focal mass or calculi. Stomach/Bowel: There is a small-to-moderate sliding hiatal hernia. No disproportionate dilation of the small or large bowel loops. No evidence  of abnormal bowel wall thickening or inflammatory changes. The appendix was not visualized; however there is no acute inflammatory process in the right lower quadrant. There are multiple diverticula mainly in the sigmoid colon, without imaging signs of diverticulitis. Vascular/Lymphatic: No ascites or pneumoperitoneum. No abdominal or pelvic lymphadenopathy, by size criteria. No aneurysmal  dilation of the major abdominal arteries. There are mild peripheral atherosclerotic vascular calcifications of the aorta and its major branches. Reproductive: The uterus is surgically absent. No large adnexal mass. Other: There is a tiny fat containing umbilical hernia. The soft tissues and abdominal wall are otherwise unremarkable. Musculoskeletal: No suspicious osseous lesions. There are moderate - marked multilevel degenerative changes in the visualized spine. Posterior spinal fixation of L5-S1 noted with transpedicular screws and rods. There is mild compression deformity of L5 vertebral body. The tips of the bilateral L5 screws are just outside the superior endplate cortex. IMPRESSION: *No acute inflammatory process identified within the abdomen or pelvis. *Bilateral single nonobstructing renal calculi noted. No hydroureteronephrosis on either side. Urinary bladder is distended but otherwise unremarkable. *Multiple other nonacute observations, as described above. Electronically Signed   By: Jules Schick M.D.   On: 08/08/2023 16:21   CT HEAD WO CONTRAST ( ) Result Date: 08/08/2023 CLINICAL DATA:  Mental status change, unknown cause EXAM: CT HEAD WITHOUT CONTRAST TECHNIQUE: Contiguous axial images were obtained from the base of the skull through the vertex without intravenous contrast. RADIATION DOSE REDUCTION: This exam was performed according to the departmental dose-optimization program which includes automated exposure control, adjustment of the mA and/or kV according to patient size and/or use of iterative reconstruction technique. COMPARISON:  Head CT 09/02/2022 FINDINGS: Brain: No hemorrhage. No hydrocephalus. No extra-axial fluid collection. No CT evidence cortical infarct. No mass effect. No mass lesion. There is a background of mild chronic microvascular ischemic change. Vascular: No hyperdense vessel or unexpected calcification. Skull: Normal. Negative for fracture or focal lesion.  Sinuses/Orbits: No middle ear or mastoid effusion. Postsurgical changes from prior extensiveFESS, including inferior ethmoidectomies. Bilateral lens replacement. Orbits are otherwise unremarkable. Other: None. IMPRESSION: No acute intracranial abnormality. Electronically Signed   By: Lorenza Cambridge M.D.   On: 08/08/2023 12:29   DG Chest Port 1 View Result Date: 08/08/2023 CLINICAL DATA:  Questionable sepsis - evaluate for abnormality. History of coronary artery disease. EXAM: PORTABLE CHEST 1 VIEW COMPARISON:  CT scan chest from 09/02/2022. FINDINGS: Bilateral lung fields are clear. Elevated right hemidiaphragm noted. Bilateral costophrenic angles are clear. Normal cardio-mediastinal silhouette. No acute osseous abnormalities. The soft tissues are within normal limits. IMPRESSION: *No active disease. Electronically Signed   By: Jules Schick M.D.   On: 08/08/2023 11:20    Microbiology: No results found for this or any previous visit (from the past 240 hours).   Labs: Basic Metabolic Panel: Recent Labs  Lab 08/24/23 0442 08/25/23 0502 08/29/23 0530  NA 131* 134* 136  K 3.7 3.5 3.8  CL 102 105 105  CO2 22 21* 24  GLUCOSE 111* 108* 100*  BUN 18 16 15   CREATININE 0.73 0.76 0.72  CALCIUM 8.5* 8.5* 8.4*   Liver Function Tests: No results for input(s): "AST", "ALT", "ALKPHOS", "BILITOT", "PROT", "ALBUMIN" in the last 168 hours. No results for input(s): "LIPASE", "AMYLASE" in the last 168 hours. Recent Labs  Lab 08/23/23 1511  AMMONIA 26   CBC: Recent Labs  Lab 08/24/23 0442 08/25/23 0502 08/29/23 0530  WBC 9.3 9.5 9.0  NEUTROABS 6.7 6.9  --   HGB 8.7* 8.5*  8.8*  HCT 27.7* 26.6* 27.5*  MCV 97.2 95.7 95.5  PLT 573* 548* 539*   Cardiac Enzymes: Recent Labs  Lab 08/30/23 0600  CKTOTAL 100   BNP: BNP (last 3 results) No results for input(s): "BNP" in the last 8760 hours.  ProBNP (last 3 results) No results for input(s): "PROBNP" in the last 8760 hours.  CBG: No  results for input(s): "GLUCAP" in the last 168 hours.     Signed:  Silvano Bilis MD.  [facility] 08/30/2023, 1:16 PM

## 2023-08-31 DIAGNOSIS — M4626 Osteomyelitis of vertebra, lumbar region: Secondary | ICD-10-CM | POA: Diagnosis not present

## 2023-08-31 DIAGNOSIS — R5381 Other malaise: Secondary | ICD-10-CM | POA: Diagnosis not present

## 2023-08-31 DIAGNOSIS — R42 Dizziness and giddiness: Secondary | ICD-10-CM | POA: Diagnosis not present

## 2023-08-31 DIAGNOSIS — M4646 Discitis, unspecified, lumbar region: Secondary | ICD-10-CM | POA: Diagnosis not present

## 2023-09-02 DIAGNOSIS — L89156 Pressure-induced deep tissue damage of sacral region: Secondary | ICD-10-CM | POA: Diagnosis not present

## 2023-09-02 DIAGNOSIS — L98423 Non-pressure chronic ulcer of back with necrosis of muscle: Secondary | ICD-10-CM | POA: Diagnosis not present

## 2023-09-06 DIAGNOSIS — M5030 Other cervical disc degeneration, unspecified cervical region: Secondary | ICD-10-CM | POA: Diagnosis not present

## 2023-09-06 DIAGNOSIS — M4626 Osteomyelitis of vertebra, lumbar region: Secondary | ICD-10-CM | POA: Diagnosis not present

## 2023-09-06 LAB — LAB REPORT - SCANNED: EGFR: 86

## 2023-09-10 ENCOUNTER — Telehealth: Payer: Self-pay

## 2023-09-10 NOTE — Telephone Encounter (Signed)
Cr 0.69  CK not included in labs that were received. [employer] [facility] [place] [facility] [phone]). Spoke with Cala Bradford, LPN. They have orders for a weekly CK, but it was not drawn. She will have nursing draw a CK tomorrow and fax to the clinic.   Sandie Ano, RN

## 2023-09-10 NOTE — Telephone Encounter (Signed)
Labs from 09/06/23:  Hgb 8.4 Hct 25.1 ESR 50 (ref range 0-25) Na 135 Alkphos 161 CRP 10.70 (ref range < 0.50)   Sandie Ano, RN

## 2023-09-14 ENCOUNTER — Encounter: Payer: Self-pay | Admitting: Anesthesiology

## 2023-09-16 ENCOUNTER — Telehealth: Payer: Self-pay | Admitting: Pharmacist

## 2023-09-16 DIAGNOSIS — L98423 Non-pressure chronic ulcer of back with necrosis of muscle: Secondary | ICD-10-CM | POA: Diagnosis not present

## 2023-09-16 DIAGNOSIS — L89133 Pressure ulcer of right lower back, stage 3: Secondary | ICD-10-CM | POA: Diagnosis not present

## 2023-09-16 DIAGNOSIS — N390 Urinary tract infection, site not specified: Secondary | ICD-10-CM | POA: Diagnosis not present

## 2023-09-16 DIAGNOSIS — L89152 Pressure ulcer of sacral region, stage 2: Secondary | ICD-10-CM | POA: Diagnosis not present

## 2023-09-16 NOTE — Telephone Encounter (Signed)
 CK resulted elevated at 330 on 12/30. LVM with patient to confirm if any myalgias or dark urine. Not concerned at lower level elevation; will continue monitoring at this time.  Alan Geralds, PharmD, CPP, BCIDP, AAHIVP Clinical Pharmacist Practitioner Infectious Diseases Clinical Pharmacist [facility]

## 2023-09-20 DIAGNOSIS — R339 Retention of urine, unspecified: Secondary | ICD-10-CM | POA: Diagnosis not present

## 2023-09-20 DIAGNOSIS — R6889 Other general symptoms and signs: Secondary | ICD-10-CM | POA: Diagnosis not present

## 2023-09-20 DIAGNOSIS — M4626 Osteomyelitis of vertebra, lumbar region: Secondary | ICD-10-CM | POA: Diagnosis not present

## 2023-09-20 LAB — LAB REPORT - SCANNED: EGFR: 80

## 2023-09-21 ENCOUNTER — Ambulatory Visit: Payer: PPO | Attending: Infectious Diseases | Admitting: Infectious Diseases

## 2023-09-21 DIAGNOSIS — B9562 Methicillin resistant Staphylococcus aureus infection as the cause of diseases classified elsewhere: Secondary | ICD-10-CM | POA: Insufficient documentation

## 2023-09-21 DIAGNOSIS — Z981 Arthrodesis status: Secondary | ICD-10-CM | POA: Insufficient documentation

## 2023-09-21 DIAGNOSIS — M4646 Discitis, unspecified, lumbar region: Secondary | ICD-10-CM | POA: Insufficient documentation

## 2023-09-21 DIAGNOSIS — T8140XA Infection following a procedure, unspecified, initial encounter: Secondary | ICD-10-CM | POA: Insufficient documentation

## 2023-09-21 DIAGNOSIS — R7881 Bacteremia: Secondary | ICD-10-CM | POA: Diagnosis not present

## 2023-09-21 NOTE — Progress Notes (Signed)
 The purpose of this virtual visit is to provide medical care while limiting exposure to the novel coronavirus (COVID19) for both patient and office staff.   Consent was obtained for video visit:  Yes.   Answered questions that patient had about telehealth interaction:  Yes.   I discussed the limitations, risks, security and privacy concerns of performing an evaluation and management service by telephone. I also discussed with the patient that there may be a patient responsible charge related to this service. The patient expressed understanding and agreed to proceed.   Patient Location: [facility] Provider Location: office  Follow up visit after recent discharge from hospital  On 06/09/23 underwent L5-S1 decompression with fusion at [facility] Admitted [facility] 08/08/23-08/30/23 Patient was hospitalized to [facility] on 08/08/2023 with acute altered mental status and acute on chronic left lateral hip pain.  She had MRSA bloodstream infection. MRI of the lumbar spine showed discitis at L4-L5 There was paraspinal soft tissue fullness The left hip did not have any concerning features for infection.  Patient was on vancomycin .  Had persistent bacteremia.  08/08/2023 was positive and 1126 was negative but 08/13/2023 was again positive for MRSA.  Vancomycin  was discontinued and dual coverage with ceftaroline  and daptomycin  was started from 12/2-24 for persistent bacteremia and fever and was given until 08/24/2023.  After that she went on daptomycin  to be given for 6 weeks from the latest negative blood culture which was 12/2-24. She is getting daptomycin  at [facility] she is doing well There is wound at the site of the previous surgical scar It was noted in the hospital as a stage II but as per the nurse it has gotten worse. She is seen by wound care specialist at [facility] Dr. Nena Patient on examination is sitting in a wheelchair She says she is doing fine She is participating in rehab She says her  pain is better I looked at her back and took pictures      Latest      WBCs 9 Hemoglobin is 8.6 CK is 38, creatinine 0.74 all these labs are from 09/20/2023 ESR is pending  labs reviewed with the nurse  Impression/recommendation  MRSA bacteremia, lumbar discitis L4-L5  History of L5-S1 decompression and fusion surgery in September 2024 She will complete 6 weeks of IV antibiotics from 12-24 until 09/27/2023 produces daptomycin  500 mg IV every 24 hours following which she will go on P.o. antibiotic which could be either doxycycline  or Bactrim  for another 6 weeks..  The risk of hyperkalemia and creatinine dysfunction increases with Bactrim . Will review the latest sed rate and then  decide.  Will discuss with her daughter as well. PICC line can be removed after completion of the last dose on 09/27/2023 and she will start oral antibiotics from 09/28/2023.  Will discuss with Dr. Nena regarding her wound care  Total time spent on this video visit  was 25 minutes

## 2023-09-23 DIAGNOSIS — L98423 Non-pressure chronic ulcer of back with necrosis of muscle: Secondary | ICD-10-CM | POA: Diagnosis not present

## 2023-09-23 DIAGNOSIS — L89893 Pressure ulcer of other site, stage 3: Secondary | ICD-10-CM | POA: Diagnosis not present

## 2023-09-23 DIAGNOSIS — L89152 Pressure ulcer of sacral region, stage 2: Secondary | ICD-10-CM | POA: Diagnosis not present

## 2023-09-23 NOTE — Progress Notes (Signed)
 09/27/2023 3:35 PM   Jocelyn Hooper 12/17/1940 [id number]  Referring provider: Cleotilde Hooper FALCON, MD [phone] [facility] [address] [facility] [place],  [place] [address]  Urological history: 1. Urge incontinence -contributing factors of age, GSM, and lumbar DDD, diuretics and pelvic surgery  2. Nephrolithiasis -stone composition calcium  oxalate -ESWL, 12/2020 -CT renal stone study (01/2022) -nonobstructive bilateral nephrolithiasis  3. rUTI's -Contributing factors of age, GSM, incontinence and constipation -Documented urine cultures over the last year   NONE    Chief Complaint  Patient presents with   Urinary Retention   HPI: Jocelyn Hooper is a 83 y.o. female who presents today for voiding trial.  Previous records reviewed.   Her bladder was found to be severely distended and on admission CT for discitis with and a Foley catheter was placed.  She presents today for a voiding trial.  She also had bilateral renal calculi, but no suspicious renal masses or hydronephrosis.  Foley was removed this morning.   She is encouraged to drink at least 30 ounces of water.   Catheter Removal  Patient is present today for a catheter removal.  8 ml of water was drained from the balloon. A 14FR foley cath was removed from the bladder, no complications were noted. Patient tolerated well.  Performed by: CLOTILDA CORNWALL, PA-C and Jocelyn Hooper, CMA and Jocelyn Hooper,  CMA   Follow up/ Additional notes: Return in about 1 month (around 10/28/2023) for PVR .    Upon return, we were told that she only had a few dribbles. .  She denied any suprapubic discomfort or urge to urinate.Jocelyn Hooper  PVR 274 mL.  Patient denies any modifying or aggravating factors.  Patient denies any recent UTI's, gross hematuria, dysuria or suprapubic/flank pain.  Patient denies any fevers, chills, nausea or vomiting.    PMH: Past Medical History:  Diagnosis Date   Bursitis of left shoulder    Chest pain,  unspecified    Complication of anesthesia    PT HAS SEVERE SCOLIOSIS AND FOR TKR ANESTHESIA COULD NOT GET A SPINAL   Coronary artery disease    ascvd   CSF leak    DDD (degenerative disc disease), cervical    Diverticulosis    Esophageal reflux    Fibrocystic breast disease    GERD (gastroesophageal reflux disease)    Headache    H/O   History of colon polyps    History of hiatal hernia    History of kidney stones    IBS (irritable bowel syndrome)    Nasal polyps    Other and unspecified hyperlipidemia    Scoliosis    Tinnitus of both ears    Trigeminal neuralgia syndrome    Unspecified sinusitis (chronic)    Vitamin B12 deficiency     Surgical History: Past Surgical History:  Procedure Laterality Date   ABDOMINAL HYSTERECTOMY     APPENDECTOMY     BACK SURGERY  1982   disc   CARDIAC CATHETERIZATION     [facility]    CHOLECYSTECTOMY  2002   COLONOSCOPY WITH PROPOFOL  N/A 03/10/2017   Procedure: COLONOSCOPY WITH PROPOFOL ;  Surgeon: Viktoria Lamar DASEN, MD;  Location: [facility] ENDOSCOPY;  Service: Endoscopy;  Laterality: N/A;   COLONOSCOPY WITH PROPOFOL  N/A 01/13/2021   Procedure: COLONOSCOPY WITH PROPOFOL ;  Surgeon: Toledo, Ladell POUR, MD;  Location: [facility] ENDOSCOPY;  Service: Gastroenterology;  Laterality: N/A;   ESOPHAGOGASTRODUODENOSCOPY (EGD) WITH PROPOFOL  N/A 01/13/2021   Procedure: ESOPHAGOGASTRODUODENOSCOPY (EGD) WITH PROPOFOL ;  Surgeon: Toledo, Ladell POUR, MD;  Location: [facility] ENDOSCOPY;  Service: Gastroenterology;  Laterality: N/A;   EXTRACORPOREAL SHOCK WAVE LITHOTRIPSY Right 01/09/2021   Procedure: EXTRACORPOREAL SHOCK WAVE LITHOTRIPSY (ESWL);  Surgeon: Penne Knee, MD;  Location: [facility] ORS;  Service: Urology;  Laterality: Right;   EYE SURGERY Bilateral    Cataract Extraction with IOL   hysterectomy (other)  1974   JOINT REPLACEMENT     LEFT knee   KNEE ARTHROPLASTY Right 09/27/2017   Procedure: COMPUTER ASSISTED TOTAL KNEE ARTHROPLASTY;  Surgeon: Mardee Lynwood SQUIBB, MD;  Location: [facility] ORS;  Service: Orthopedics;  Laterality: Right;   KNEE ARTHROSCOPY Right 09/13/2015   Procedure: ARTHROSCOPY KNEE, PARTIAL LATERAL MENISECTOMY, CHONDROPLASTY ;  Surgeon: Lynwood SQUIBB Mardee, MD;  Location: [facility] ORS;  Service: Orthopedics;  Laterality: Right;   KNEE SURGERY  2004   having a lot of problems with left knee   NASAL SINUS SURGERY  1967, 1988, 1993, 2003, 2012   sinus procedures  1970,1990,1994,2005   TEE WITHOUT CARDIOVERSION N/A 08/11/2023   Procedure: TRANSESOPHAGEAL ECHOCARDIOGRAM (TEE);  Surgeon: Perla Evalene PARAS, MD;  Location: [facility] ORS;  Service: Cardiovascular;  Laterality: N/A;   TONSILLECTOMY  2015   TRANSFORAMINAL LUMBAR INTERBODY FUSION (TLIF) WITH PEDICLE SCREW FIXATION 1 LEVEL N/A 06/09/2023   Procedure: TRANSFOAMINAL LUMBAR INTERBODY FUSION,INTERBODY PROSTHESIS,POSTERIOR INSTRUMENTATION LUMBAR FIVE-SACRAL ONE;  Surgeon: Mavis Purchase, MD;  Location: [facility] OR;  Service: Neurosurgery;  Laterality: N/A;    Home Medications:  Allergies as of 09/27/2023       Reactions   Avelox [moxifloxacin Hcl In Nacl] Other (See Comments)   Redness all over in about 5 minutes after taking   Codeine Nausea Only   Acyclovir And Related    Turn red all over    Mirtazapine  Itching   Becomes twitchy and myoclonic   Shrimp [shellfish Allergy]    Hives    Ciprofloxacin Rash   Moxifloxacin Hives, Rash   Sucralfate Rash   Itching         Medication List        Accurate as of September 27, 2023  3:36 PM. If you have any questions, ask your nurse or doctor.          ascorbic acid  500 MG tablet Commonly known as: VITAMIN C  Take 1,000 mg by mouth 2 (two) times daily.   aspirin  EC 81 MG tablet Take 81 mg by mouth daily.   Calcium  Carbonate-Vitamin D  600-400 MG-UNIT tablet Take 1 tablet by mouth 2 (two) times daily.   cetirizine 10 MG tablet Commonly known as: ZYRTEC Take 10 mg by mouth daily.   cyclobenzaprine  5 MG tablet Commonly known as:  FLEXERIL  Take 1 tablet (5 mg total) by mouth 3 (three) times daily as needed for muscle spasms.   daptomycin  500-0.9 MG/50ML-% Soln Commonly known as: CUBICIN  Inject 50 mLs (500 mg total) into the vein daily at 2 PM.   daptomycin  IVPB Commonly known as: CUBICIN  Inject 500 mg into the vein daily. Indication:  MRSA bacteremia and L4-5 discitis Last Day of Therapy:  09/27/2023 Labs - Once weekly:  CBC/Diff, CMP, CPK, ESR and CRP Fax weekly lab results  promptly to [phone] Routine PICC care Please pull PICC at completion of IV antibiotics Method of administration: IV Push Method of administration may be changed at the discretion of facility's pharmacy [facility] for Rhabdomyolysis, eosinophilia, pneumonia while on Daptomycin  Call [phone] with abnormal labs or any other questions   docusate sodium  100 MG capsule Commonly  known as: COLACE Take 1 capsule (100 mg total) by mouth 2 (two) times daily.   estradiol  0.5 MG tablet Commonly known as: ESTRACE  Take 0.5 mg by mouth daily.   fluticasone  50 MCG/ACT nasal spray Commonly known as: FLONASE  Place 1 spray into both nostrils in the morning and at bedtime.   gabapentin  300 MG capsule Commonly known as: NEURONTIN  Take 300 mg by mouth at bedtime.   hydrochlorothiazide 25 MG tablet Commonly known as: HYDRODIURIL Take 25 mg by mouth as needed.   HYDROcodone -acetaminophen  5-325 MG tablet Commonly known as: NORCO/VICODIN Take by mouth.   lansoprazole 30 MG capsule Commonly known as: PREVACID Take 30 mg by mouth daily.   Melatonin 10 MG Tabs Take 10 mg by mouth at bedtime.   metoCLOPramide  5 MG tablet Commonly known as: REGLAN  Take 5 mg by mouth every 8 (eight) hours as needed for vomiting or nausea.   metoprolol  succinate 25 MG 24 hr tablet Commonly known as: TOPROL -XL Take 1 tablet by mouth daily.   morphine  15 MG 12 hr tablet Commonly known as: MS CONTIN  Take 1 tablet (15 mg total) by mouth every 12 (twelve)  hours.   oxyCODONE -acetaminophen  5-325 MG tablet Commonly known as: PERCOCET/ROXICET Take 1 tablet by mouth every 4 (four) hours as needed for moderate pain (pain score 4-6).   sucralfate 1 g tablet Commonly known as: CARAFATE Take 1 g by mouth 4 (four) times daily.   tamsulosin  0.4 MG Caps capsule Commonly known as: FLOMAX  Take 1 capsule (0.4 mg total) by mouth daily after supper.   topiramate  50 MG tablet Commonly known as: TOPAMAX  Take 150 mg by mouth daily.        Allergies:  Allergies  Allergen Reactions   Avelox [Moxifloxacin Hcl In Nacl] Other (See Comments)    Redness all over in about 5 minutes after taking   Codeine Nausea Only   Acyclovir And Related     Turn red all over    Mirtazapine  Itching    Becomes twitchy and myoclonic   Shrimp [Shellfish Allergy]     Hives     Ciprofloxacin Rash   Moxifloxacin Hives and Rash   Sucralfate Rash    Itching     Family History: Family History  Problem Relation Age of Onset   Heart attack Mother 84   Heart failure Mother    Heart attack Father    Heart disease Father        CABG   Heart attack Brother 38       premature   Breast cancer Sister 25    Social History:  reports that she has never smoked. She has never used smokeless tobacco. She reports that she does not drink alcohol and does not use drugs.  ROS: Pertinent ROS in HPI  Physical Exam: BP 125/79 (BP Location: Left Arm, Patient Position: Sitting, Cuff Size: Normal)   Pulse (!) 107   Ht 5' 6 (1.676 m)   Wt 117 lb 3.2 oz (53.2 kg)   BMI 18.92 kg/m   Constitutional:  Well nourished. Alert and oriented, No acute distress. HEENT: [place] AT, moist mucus membranes.  Trachea midline Cardiovascular: No clubbing, cyanosis, or edema. Respiratory: Normal respiratory effort, no increased work of breathing. Neurologic: Grossly intact, no focal deficits, moving all 4 extremities. Psychiatric: Normal mood and affect.    Laboratory Data: Calcium  8.7 - 10.3  mg/dL 9.5   AST 8 - 39 U/L 17   ALT 5 - 38 U/L 9  Alk Phos (alkaline Phosphatase) 34 - 104 U/L 63   Albumin 3.5 - 4.8 g/dL 4.1   Bilirubin, Total 0.3 - 1.2 mg/dL 0.4   Protein, Total 6.1 - 7.9 g/dL 6.3   A/G Ratio 1.0 - 5.0 gm/dL 1.9   Resulting Agency [facility]   Specimen Collected: 10/20/22 08:59   Performed by: [facility] Last Resulted: 10/20/22 17:37  Received From: [facility]  Result Received: 01/04/23 13:59    Urinalysis Results for orders placed or performed in visit on 09/27/23  Bladder Scan (Post Void Residual) in office   Collection Time: 09/27/23  3:01 PM  Result Value Ref Range   Scan Result   I have reviewed the labs.   Pertinent Imaging:  09/27/23 15:01  Scan Result     Assessment & Plan:    1. Urinary retention - foley catheter removed for voiding trial this am  -Discussed with patient that her PVR was enough that we could consider replacing the catheter, but that would come with with reinfection or we could give her more time to see if she voids on her own later tonight.  Her facility is able to BladderScan her and place the catheter if necessary.  We decided on the latter and they will BladderScan her at 8:00 tonight and if her residual is greater than 300 they will place a Foley catheter.  If she is able to void on her own she will follow-up in 1 month's time.  If they do have to place a catheter, they will contact us  and we will schedule an appointment to discuss her options going forward  2. Bilateral nephrolithiasis -Asymptomatic at this visit   Return in about 1 month (around 10/28/2023) for PVR .  These notes generated with voice recognition software. I apologize for typographical errors.  CLOTILDA HELON RIGGERS  [facility] [address]  [address] [place], [place] [address] [phone]

## 2023-09-27 ENCOUNTER — Ambulatory Visit: Payer: PPO | Admitting: Urology

## 2023-09-27 ENCOUNTER — Ambulatory Visit (INDEPENDENT_AMBULATORY_CARE_PROVIDER_SITE_OTHER): Payer: PPO | Admitting: Urology

## 2023-09-27 ENCOUNTER — Encounter: Payer: Self-pay | Admitting: Urology

## 2023-09-27 VITALS — BP 125/79 | HR 107 | Ht 66.0 in | Wt 117.2 lb

## 2023-09-27 DIAGNOSIS — R338 Other retention of urine: Secondary | ICD-10-CM | POA: Diagnosis not present

## 2023-09-27 DIAGNOSIS — R5381 Other malaise: Secondary | ICD-10-CM | POA: Diagnosis not present

## 2023-09-27 DIAGNOSIS — M4626 Osteomyelitis of vertebra, lumbar region: Secondary | ICD-10-CM | POA: Diagnosis not present

## 2023-09-27 DIAGNOSIS — R339 Retention of urine, unspecified: Secondary | ICD-10-CM | POA: Diagnosis not present

## 2023-09-27 DIAGNOSIS — N200 Calculus of kidney: Secondary | ICD-10-CM

## 2023-09-27 LAB — BLADDER SCAN AMB NON-IMAGING

## 2023-09-28 ENCOUNTER — Telehealth: Payer: Self-pay | Admitting: Infectious Diseases

## 2023-09-28 ENCOUNTER — Telehealth: Payer: Self-pay

## 2023-09-28 NOTE — Telephone Encounter (Signed)
 Per Dr. Fayette end IV abx today and picc line can be removed. Patient will need to start oral abx doxycycline  100 mg BID x 6 weeks.  Also check ESR, CRP, CBC w/diff, CMP once every 2 weeks.   Orders faxed to to Suzen Brought with [facility]. Fax# [phone] Phone# [phone]  Dr. Fayette also relayed orders to patient's daughter Randine. Kevaughn Ewing ONEIDA Ligas, CMA

## 2023-09-28 NOTE — Telephone Encounter (Signed)
 Kimberley called to get length of PICC for removal.   Per LDA, PICC is a right basilic 33 cm.   Sandie Ano, RN

## 2023-09-28 NOTE — Telephone Encounter (Signed)
 Lumbar discitis, osteo, abscess Pt is completing 6 weeks of iV daptomycin  today- she is in [facility]- picc will be removed and she will start on Po Doxy 100mg  BID for 6 weeks I spoke to her daughter and let her know as she will be discharged soon from [facility] Once she is home she will need to make an appt with surgeon Dr.jenkins She will also need an appt with me in 1 month

## 2023-09-29 ENCOUNTER — Telehealth: Payer: Self-pay

## 2023-09-29 NOTE — Telephone Encounter (Signed)
 Per Dr. Francee Inch okay to continue doxy.  Spoke with Jullie Oiler at [employer] who verbalized understanding. Julien Odor, RMA

## 2023-09-29 NOTE — Telephone Encounter (Signed)
 Received call form RN at [facility] regarding lab values from 1/13. Elevated WBC 13.9, CRP 5.91, and sed rate 66. Labs faxed to office for review. Secure chat forwarded to provider and pharmacy team.  Patient has completed IV antbx and is now on doxy. Julien Odor, RMA

## 2023-09-30 DIAGNOSIS — L89133 Pressure ulcer of right lower back, stage 3: Secondary | ICD-10-CM | POA: Diagnosis not present

## 2023-10-05 DIAGNOSIS — M4626 Osteomyelitis of vertebra, lumbar region: Secondary | ICD-10-CM | POA: Diagnosis not present

## 2023-10-05 DIAGNOSIS — R5381 Other malaise: Secondary | ICD-10-CM | POA: Diagnosis not present

## 2023-10-05 DIAGNOSIS — L89109 Pressure ulcer of unspecified part of back, unspecified stage: Secondary | ICD-10-CM | POA: Diagnosis not present

## 2023-10-05 DIAGNOSIS — M4646 Discitis, unspecified, lumbar region: Secondary | ICD-10-CM | POA: Diagnosis not present

## 2023-10-10 DIAGNOSIS — Z96653 Presence of artificial knee joint, bilateral: Secondary | ICD-10-CM | POA: Diagnosis not present

## 2023-10-10 DIAGNOSIS — M4317 Spondylolisthesis, lumbosacral region: Secondary | ICD-10-CM | POA: Diagnosis not present

## 2023-10-10 DIAGNOSIS — Z96652 Presence of left artificial knee joint: Secondary | ICD-10-CM | POA: Diagnosis not present

## 2023-10-10 DIAGNOSIS — M419 Scoliosis, unspecified: Secondary | ICD-10-CM | POA: Diagnosis not present

## 2023-10-10 DIAGNOSIS — Z87440 Personal history of urinary (tract) infections: Secondary | ICD-10-CM | POA: Diagnosis not present

## 2023-10-10 DIAGNOSIS — L89313 Pressure ulcer of right buttock, stage 3: Secondary | ICD-10-CM | POA: Diagnosis not present

## 2023-10-10 DIAGNOSIS — R1310 Dysphagia, unspecified: Secondary | ICD-10-CM | POA: Diagnosis not present

## 2023-10-10 DIAGNOSIS — Z79899 Other long term (current) drug therapy: Secondary | ICD-10-CM | POA: Diagnosis not present

## 2023-10-10 DIAGNOSIS — G253 Myoclonus: Secondary | ICD-10-CM | POA: Diagnosis not present

## 2023-10-10 DIAGNOSIS — Z87442 Personal history of urinary calculi: Secondary | ICD-10-CM | POA: Diagnosis not present

## 2023-10-10 DIAGNOSIS — M5030 Other cervical disc degeneration, unspecified cervical region: Secondary | ICD-10-CM | POA: Diagnosis not present

## 2023-10-10 DIAGNOSIS — K219 Gastro-esophageal reflux disease without esophagitis: Secondary | ICD-10-CM | POA: Diagnosis not present

## 2023-10-10 DIAGNOSIS — M4626 Osteomyelitis of vertebra, lumbar region: Secondary | ICD-10-CM | POA: Diagnosis not present

## 2023-10-10 DIAGNOSIS — D509 Iron deficiency anemia, unspecified: Secondary | ICD-10-CM | POA: Diagnosis not present

## 2023-10-10 DIAGNOSIS — L89144 Pressure ulcer of left lower back, stage 4: Secondary | ICD-10-CM | POA: Diagnosis not present

## 2023-10-10 DIAGNOSIS — K5790 Diverticulosis of intestine, part unspecified, without perforation or abscess without bleeding: Secondary | ICD-10-CM | POA: Diagnosis not present

## 2023-10-10 DIAGNOSIS — R339 Retention of urine, unspecified: Secondary | ICD-10-CM | POA: Diagnosis not present

## 2023-10-10 DIAGNOSIS — K589 Irritable bowel syndrome without diarrhea: Secondary | ICD-10-CM | POA: Diagnosis not present

## 2023-10-10 DIAGNOSIS — Z7982 Long term (current) use of aspirin: Secondary | ICD-10-CM | POA: Diagnosis not present

## 2023-10-10 DIAGNOSIS — I2510 Atherosclerotic heart disease of native coronary artery without angina pectoris: Secondary | ICD-10-CM | POA: Diagnosis not present

## 2023-10-10 DIAGNOSIS — L89134 Pressure ulcer of right lower back, stage 4: Secondary | ICD-10-CM | POA: Diagnosis not present

## 2023-10-10 DIAGNOSIS — G4700 Insomnia, unspecified: Secondary | ICD-10-CM | POA: Diagnosis not present

## 2023-10-10 DIAGNOSIS — D519 Vitamin B12 deficiency anemia, unspecified: Secondary | ICD-10-CM | POA: Diagnosis not present

## 2023-10-10 DIAGNOSIS — E785 Hyperlipidemia, unspecified: Secondary | ICD-10-CM | POA: Diagnosis not present

## 2023-10-10 DIAGNOSIS — S91103D Unspecified open wound of unspecified great toe without damage to nail, subsequent encounter: Secondary | ICD-10-CM | POA: Diagnosis not present

## 2023-10-10 DIAGNOSIS — Z792 Long term (current) use of antibiotics: Secondary | ICD-10-CM | POA: Diagnosis not present

## 2023-10-10 DIAGNOSIS — L89152 Pressure ulcer of sacral region, stage 2: Secondary | ICD-10-CM | POA: Diagnosis not present

## 2023-10-10 DIAGNOSIS — G500 Trigeminal neuralgia: Secondary | ICD-10-CM | POA: Diagnosis not present

## 2023-10-10 DIAGNOSIS — Z96651 Presence of right artificial knee joint: Secondary | ICD-10-CM | POA: Diagnosis not present

## 2023-10-10 DIAGNOSIS — L89522 Pressure ulcer of left ankle, stage 2: Secondary | ICD-10-CM | POA: Diagnosis not present

## 2023-10-10 DIAGNOSIS — M4646 Discitis, unspecified, lumbar region: Secondary | ICD-10-CM | POA: Diagnosis not present

## 2023-10-16 DIAGNOSIS — L89154 Pressure ulcer of sacral region, stage 4: Secondary | ICD-10-CM | POA: Diagnosis not present

## 2023-10-16 DIAGNOSIS — L89103 Pressure ulcer of unspecified part of back, stage 3: Secondary | ICD-10-CM | POA: Diagnosis not present

## 2023-10-18 DIAGNOSIS — L89154 Pressure ulcer of sacral region, stage 4: Secondary | ICD-10-CM | POA: Diagnosis not present

## 2023-10-18 DIAGNOSIS — L89103 Pressure ulcer of unspecified part of back, stage 3: Secondary | ICD-10-CM | POA: Diagnosis not present

## 2023-10-20 DIAGNOSIS — L89152 Pressure ulcer of sacral region, stage 2: Secondary | ICD-10-CM | POA: Diagnosis not present

## 2023-10-20 DIAGNOSIS — M419 Scoliosis, unspecified: Secondary | ICD-10-CM | POA: Diagnosis not present

## 2023-10-20 DIAGNOSIS — M4626 Osteomyelitis of vertebra, lumbar region: Secondary | ICD-10-CM | POA: Diagnosis not present

## 2023-10-20 DIAGNOSIS — M4646 Discitis, unspecified, lumbar region: Secondary | ICD-10-CM | POA: Diagnosis not present

## 2023-10-20 DIAGNOSIS — L89313 Pressure ulcer of right buttock, stage 3: Secondary | ICD-10-CM | POA: Diagnosis not present

## 2023-10-20 DIAGNOSIS — M4317 Spondylolisthesis, lumbosacral region: Secondary | ICD-10-CM | POA: Diagnosis not present

## 2023-10-20 DIAGNOSIS — M5030 Other cervical disc degeneration, unspecified cervical region: Secondary | ICD-10-CM | POA: Diagnosis not present

## 2023-10-21 DIAGNOSIS — L89103 Pressure ulcer of unspecified part of back, stage 3: Secondary | ICD-10-CM | POA: Diagnosis not present

## 2023-10-21 DIAGNOSIS — L89312 Pressure ulcer of right buttock, stage 2: Secondary | ICD-10-CM | POA: Diagnosis not present

## 2023-10-21 DIAGNOSIS — L89313 Pressure ulcer of right buttock, stage 3: Secondary | ICD-10-CM | POA: Diagnosis not present

## 2023-10-21 DIAGNOSIS — L89102 Pressure ulcer of unspecified part of back, stage 2: Secondary | ICD-10-CM | POA: Diagnosis not present

## 2023-10-21 DIAGNOSIS — S91103D Unspecified open wound of unspecified great toe without damage to nail, subsequent encounter: Secondary | ICD-10-CM | POA: Diagnosis not present

## 2023-10-25 ENCOUNTER — Telehealth: Payer: Self-pay

## 2023-10-25 NOTE — Telephone Encounter (Signed)
 [name] Sherrlyn Dolores left voicemail wanting to know when patient's next appointment is and states virtual would be easier. Also asking if any lab work needs to be done.   Per Dr. Francee Inch, she would prefer in-person appointment, but if unable to then virtual is okay.   Called Sherrlyn Dolores back to schedule, discussed that Dr. Francee Inch would prefer to see Shreshta in person. Sherrlyn Dolores states that bringing her to the clinic is very difficult, but could do a virtual appointment tomorrow. Scheduled for 9:15.   Consuella Scurlock D Camar Guyton, RN

## 2023-10-26 ENCOUNTER — Ambulatory Visit: Payer: PPO | Attending: Infectious Diseases | Admitting: Infectious Diseases

## 2023-10-26 ENCOUNTER — Telehealth: Payer: Self-pay

## 2023-10-26 DIAGNOSIS — Z981 Arthrodesis status: Secondary | ICD-10-CM | POA: Insufficient documentation

## 2023-10-26 DIAGNOSIS — M4646 Discitis, unspecified, lumbar region: Secondary | ICD-10-CM | POA: Diagnosis not present

## 2023-10-26 DIAGNOSIS — M4636 Infection of intervertebral disc (pyogenic), lumbar region: Secondary | ICD-10-CM | POA: Insufficient documentation

## 2023-10-26 DIAGNOSIS — L89159 Pressure ulcer of sacral region, unspecified stage: Secondary | ICD-10-CM | POA: Diagnosis not present

## 2023-10-26 DIAGNOSIS — R7881 Bacteremia: Secondary | ICD-10-CM

## 2023-10-26 DIAGNOSIS — R339 Retention of urine, unspecified: Secondary | ICD-10-CM | POA: Insufficient documentation

## 2023-10-26 DIAGNOSIS — B9562 Methicillin resistant Staphylococcus aureus infection as the cause of diseases classified elsewhere: Secondary | ICD-10-CM | POA: Diagnosis not present

## 2023-10-26 NOTE — Progress Notes (Signed)
The purpose of this virtual visit is to provide medical care while limiting exposure to the novel coronavirus (COVID19) for both patient and office staff.   Consent was obtained for video visit:  Yes.   Answered questions that patient had about telehealth interaction:  Yes.   I discussed the limitations, risks, security and privacy concerns of performing an evaluation and management service by telephone. I also discussed with the patient that there may be a patient responsible charge related to this service. The patient expressed understanding and agreed to proceed.   Patient Location: Home Provider Location:office On the visit were patient, daughter and physician  Follow up visit  06/09/23 underwent L5-S1 decompression with fusion at [facility] Admitted [facility] 08/08/23-08/30/23 Patient was hospitalized to [facility] on 08/08/2023 with acute altered mental status and acute on chronic left lateral hip pain.  She had MRSA bloodstream infection. MRI of the lumbar spine showed discitis at L4-L5 There was paraspinal soft tissue fullness Jocelyn Hooper is a 83 year old female with lumbar discitis and MRSA bacteremia  for follow-up on wound management and antibiotic treatment. The left hip did not have any concerning features for infection. Patient was on vancomycin. Had persistent bacteremia. 08/08/2023 was positive and 1126 was negative but 08/13/2023 was again positive for MRSA. Vancomycin was discontinued and dual coverage with ceftaroline and daptomycin was started from 12/2-24 for persistent bacteremia and fever and was given until 08/24/2023. After that she went on daptomycin to be given for 6 weeks from the latest negative blood culture which was on 08/16/23.  [facility] went to [facility] and was discharged home  on 10/02/23 She completed daptomycin on 1/13 and started doxy since then for another 6 weeks She has a pressure wound above surgical site on her back, which is being managed with daily dressing changes  using foam dressings and silver AG, along with cleaning with saline. The wound has shown improvement since her discharge from the skilled nursing facility.  She has a Foley catheter due to urinary retention, which was initially placed in the hospital. Attempts to remove it at a urologist appointment were unsuccessful due to significant residue, necessitating its reinsertion.  She experiences intermittent joint pain affecting various parts of her body, including her feet and ankles. No history of gout. She also experiences constipation. No fever, chills, or diarrhea.  Her nutrition includes drinking Ensure and consuming small meals, with an emphasis on increasing protein, vitamin C, zinc, and hydration. She has some pain management needs, with a history of using hydrocodone prior to her surgery and daughter wants to know whether  she can give her morphine and hydrocodone which they have at home. I asked her not to use the meds and talk to PCP  O/e pt is awake No distress      Wound on the back above the surgical site has some fibrous tissue and eschar Looks better than before  Impression/recommendation Lumbar Dischitis with MRSA Bacteremia Doxycycline has been administered for a month following six weeks of IV daptomycin for MRSA bacteremia linked to lumbar dischitis and underlying hardware. The effectiveness of the antibiotic treatment needs assessment to determine if further antibiotics are necessary. The possibility of completing antibiotics at twelve weeks or extending the duration based on lab results was discussed.  Order CBC, CMP, ESR, and CRP labs.  Evaluate lab results to decide on the continuation or cessation of doxycycline. Coordinate with Dr. Hyacinth Meeker for medication refills and further management.  Pressure Ulcer A pressure  ulcer located slightly above the surgical site shows improvement since the last observation. Current management includes daily dressing changes with foam  dressings and saline cleaning. The potential benefit of using silver AG dressings was discussed. Emphasize the importance of consulting a wound care specialist and proper positioning with pillows to alleviate pressure on the ulcer.  Increase protein intake, vitamin C, zinc, and hydration to support healing. Follow up with  Dr. Lovell Sheehan neurosurgeon.   Urinary Retention with Foley Catheter A Foley catheter is in place due to urinary retention. A previous attempt to remove the catheter was unsuccessful due to significant residue. A follow-up with a urologist is scheduled. Attend the urologist appointment with Carollee Herter on Friday and coordinate lab work with this appointment.  Joint Pain Intermittent joint pain is reported, possibly related to prolonged bed rest or sequelae of sepsis, with no signs of gout or other specific joint conditions. Non-opioid pain management options were discussed, and consultation with Dr. Hyacinth Meeker for pain management is needed. Monitor joint pain and consider non-opioid pain management options.   Follow-up Schedule a follow-up with Dr. Hyacinth Meeker. Ensure lab results are reviewed before the end of the month. Communicate lab results and further plans before the end of the month  Total time spent on this video call was 20 min

## 2023-10-26 NOTE — Telephone Encounter (Signed)
Jocelyn Hooper, patient's daughter left message on triage line stating that patient is no longer in the facility and lives at home. Patient has catheter in. Patient has an appointment on Friday 10/29/23 and Jocelyn Hooper states patient is immobile and she has to go in the wheelchair for transport and it is hard for them to move patient and Jocelyn Hooper wanted to know what we were going to do on Friday. 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 They will not be able to do 2 appointments in one day.  Please advise on how to proceed [phone].

## 2023-10-27 ENCOUNTER — Telehealth: Payer: Self-pay

## 2023-10-27 ENCOUNTER — Other Ambulatory Visit: Payer: Self-pay | Admitting: Infectious Diseases

## 2023-10-27 MED ORDER — DOXYCYCLINE MONOHYDRATE 100 MG PO TABS
100.0000 mg | ORAL_TABLET | Freq: Two times a day (BID) | ORAL | 0 refills | Status: DC
Start: 2023-10-27 — End: 2023-11-04

## 2023-10-27 NOTE — Telephone Encounter (Signed)
Patient daughter [name] aware RX for Doxycycline has been sent to [facility] pharmacy.   Oseph Imburgia Lesli Albee, CMA

## 2023-10-27 NOTE — Progress Notes (Signed)
Doxycycline - prescription sent to [facility]

## 2023-10-27 NOTE — Telephone Encounter (Signed)
Received voicemail from patient's daughter, states they only have 3 days of doxycycline left and would like remaining supply needed sent to [facility].   Sandie Ano, RN

## 2023-10-29 ENCOUNTER — Other Ambulatory Visit: Payer: Self-pay

## 2023-10-29 ENCOUNTER — Other Ambulatory Visit
Admission: RE | Admit: 2023-10-29 | Discharge: 2023-10-29 | Disposition: A | Discharge status: Home / Self Care | Payer: PPO | Source: Ambulatory Visit | Attending: Infectious Diseases | Admitting: Infectious Diseases

## 2023-10-29 ENCOUNTER — Ambulatory Visit: Payer: Self-pay | Admitting: Urology

## 2023-10-29 DIAGNOSIS — L89313 Pressure ulcer of right buttock, stage 3: Secondary | ICD-10-CM | POA: Diagnosis not present

## 2023-10-29 DIAGNOSIS — M4646 Discitis, unspecified, lumbar region: Secondary | ICD-10-CM | POA: Diagnosis not present

## 2023-10-29 DIAGNOSIS — L89152 Pressure ulcer of sacral region, stage 2: Secondary | ICD-10-CM | POA: Diagnosis not present

## 2023-10-29 DIAGNOSIS — M4626 Osteomyelitis of vertebra, lumbar region: Secondary | ICD-10-CM | POA: Diagnosis not present

## 2023-10-29 DIAGNOSIS — R0989 Other specified symptoms and signs involving the circulatory and respiratory systems: Secondary | ICD-10-CM | POA: Diagnosis not present

## 2023-10-29 DIAGNOSIS — M4317 Spondylolisthesis, lumbosacral region: Secondary | ICD-10-CM | POA: Diagnosis not present

## 2023-10-29 DIAGNOSIS — A419 Sepsis, unspecified organism: Secondary | ICD-10-CM | POA: Diagnosis not present

## 2023-10-29 DIAGNOSIS — L89156 Pressure-induced deep tissue damage of sacral region: Secondary | ICD-10-CM | POA: Diagnosis not present

## 2023-10-29 DIAGNOSIS — L89100 Pressure ulcer of unspecified part of back, unstageable: Secondary | ICD-10-CM | POA: Diagnosis not present

## 2023-10-29 LAB — CBC WITH DIFFERENTIAL/PLATELET
Abs Immature Granulocytes: 0.06 10*3/uL (ref 0.00–0.07)
Basophils Absolute: 0.1 10*3/uL (ref 0.0–0.1)
Basophils Relative: 1 %
Eosinophils Absolute: 0.2 10*3/uL (ref 0.0–0.5)
Eosinophils Relative: 2 %
HCT: 32.7 % — ABNORMAL LOW (ref 36.0–46.0)
Hemoglobin: 10.4 g/dL — ABNORMAL LOW (ref 12.0–15.0)
Immature Granulocytes: 1 %
Lymphocytes Relative: 16 %
Lymphs Abs: 1.7 10*3/uL (ref 0.7–4.0)
MCH: 28.4 pg (ref 26.0–34.0)
MCHC: 31.8 g/dL (ref 30.0–36.0)
MCV: 89.3 fL (ref 80.0–100.0)
Monocytes Absolute: 0.7 10*3/uL (ref 0.1–1.0)
Monocytes Relative: 6 %
Neutro Abs: 8.2 10*3/uL — ABNORMAL HIGH (ref 1.7–7.7)
Neutrophils Relative %: 74 %
Platelets: 338 10*3/uL (ref 150–400)
RBC: 3.66 MIL/uL — ABNORMAL LOW (ref 3.87–5.11)
RDW: 17.5 % — ABNORMAL HIGH (ref 11.5–15.5)
WBC: 10.9 10*3/uL — ABNORMAL HIGH (ref 4.0–10.5)
nRBC: 0 % (ref 0.0–0.2)

## 2023-10-29 LAB — COMPREHENSIVE METABOLIC PANEL
ALT: 14 U/L (ref 0–44)
AST: 20 U/L (ref 15–41)
Albumin: 2.6 g/dL — ABNORMAL LOW (ref 3.5–5.0)
Alkaline Phosphatase: 134 U/L — ABNORMAL HIGH (ref 38–126)
Anion gap: 10 (ref 5–15)
BUN: 24 mg/dL — ABNORMAL HIGH (ref 8–23)
CO2: 20 mmol/L — ABNORMAL LOW (ref 22–32)
Calcium: 9.1 mg/dL (ref 8.9–10.3)
Chloride: 107 mmol/L (ref 98–111)
Creatinine, Ser: 0.81 mg/dL (ref 0.44–1.00)
GFR, Estimated: >60 mL/min (ref >60)
Glucose, Bld: 120 mg/dL — ABNORMAL HIGH (ref 70–99)
Potassium: 3.4 mmol/L — ABNORMAL LOW (ref 3.5–5.1)
Sodium: 137 mmol/L (ref 135–145)
Total Bilirubin: 0.5 mg/dL (ref 0.0–1.2)
Total Protein: 6.6 g/dL (ref 6.5–8.1)

## 2023-10-29 LAB — SEDIMENTATION RATE: Sed Rate: 31 mm/h — ABNORMAL HIGH (ref 0–30)

## 2023-10-29 LAB — C-REACTIVE PROTEIN: CRP: 0.7 mg/dL

## 2023-10-29 NOTE — Addendum Note (Signed)
Addended by: Lonell Face C on: 10/29/2023 11:18 AM   Modules accepted: Orders

## 2023-11-01 ENCOUNTER — Ambulatory Visit: Payer: PPO | Admitting: Urology

## 2023-11-02 DIAGNOSIS — L89103 Pressure ulcer of unspecified part of back, stage 3: Secondary | ICD-10-CM | POA: Diagnosis not present

## 2023-11-02 DIAGNOSIS — L89313 Pressure ulcer of right buttock, stage 3: Secondary | ICD-10-CM | POA: Diagnosis not present

## 2023-11-02 DIAGNOSIS — S91103D Unspecified open wound of unspecified great toe without damage to nail, subsequent encounter: Secondary | ICD-10-CM | POA: Diagnosis not present

## 2023-11-02 DIAGNOSIS — L89102 Pressure ulcer of unspecified part of back, stage 2: Secondary | ICD-10-CM | POA: Diagnosis not present

## 2023-11-02 DIAGNOSIS — L89312 Pressure ulcer of right buttock, stage 2: Secondary | ICD-10-CM | POA: Diagnosis not present

## 2023-11-03 ENCOUNTER — Ambulatory Visit: Payer: PPO | Admitting: Urology

## 2023-11-03 NOTE — Progress Notes (Deleted)
 11/04/2023 9:37 AM   Jocelyn Hooper Jul 06, 1941 [id number]  Referring provider: Danella Penton, MD [phone] [facility] [address] [facility] [place],  [place] [address]  Urological history: 1. Urge incontinence -contributing factors of age, GSM, and lumbar DDD, diuretics and pelvic surgery  2. Nephrolithiasis -stone composition calcium oxalate -ESWL, 12/2020 -CT renal stone study (01/2022) -nonobstructive bilateral nephrolithiasis  3. rUTI's -Contributing factors of age, GSM, incontinence and constipation -Documented urine cultures over the last year   NONE    No chief complaint on file.  HPI: Jocelyn Hooper is a 83 y.o. female who presents today for voiding trial.  Previous records reviewed.   Her daughter removed her catheter this morning.    PMH: Past Medical History:  Diagnosis Date   Bursitis of left shoulder    Chest pain, unspecified    Complication of anesthesia    PT HAS SEVERE SCOLIOSIS AND FOR TKR ANESTHESIA COULD NOT GET A SPINAL   Coronary artery disease    ascvd   CSF leak    DDD (degenerative disc disease), cervical    Diverticulosis    Esophageal reflux    Fibrocystic breast disease    GERD (gastroesophageal reflux disease)    Headache    H/O   History of colon polyps    History of hiatal hernia    History of kidney stones    IBS (irritable bowel syndrome)    Nasal polyps    Other and unspecified hyperlipidemia    Scoliosis    Tinnitus of both ears    Trigeminal neuralgia syndrome    Unspecified sinusitis (chronic)    Vitamin B12 deficiency     Surgical History: Past Surgical History:  Procedure Laterality Date   ABDOMINAL HYSTERECTOMY     APPENDECTOMY     BACK SURGERY  1982   disc   CARDIAC CATHETERIZATION     [facility]    CHOLECYSTECTOMY  2002   COLONOSCOPY WITH PROPOFOL N/A 03/10/2017   Procedure: COLONOSCOPY WITH PROPOFOL;  Surgeon: Scot Jun, MD;  Location: [facility] ENDOSCOPY;  Service: Endoscopy;   Laterality: N/A;   COLONOSCOPY WITH PROPOFOL N/A 01/13/2021   Procedure: COLONOSCOPY WITH PROPOFOL;  Surgeon: Toledo, Boykin Nearing, MD;  Location: [facility] ENDOSCOPY;  Service: Gastroenterology;  Laterality: N/A;   ESOPHAGOGASTRODUODENOSCOPY (EGD) WITH PROPOFOL N/A 01/13/2021   Procedure: ESOPHAGOGASTRODUODENOSCOPY (EGD) WITH PROPOFOL;  Surgeon: Toledo, Boykin Nearing, MD;  Location: [facility] ENDOSCOPY;  Service: Gastroenterology;  Laterality: N/A;   EXTRACORPOREAL SHOCK WAVE LITHOTRIPSY Right 01/09/2021   Procedure: EXTRACORPOREAL SHOCK WAVE LITHOTRIPSY (ESWL);  Surgeon: Vanna Scotland, MD;  Location: [facility] ORS;  Service: Urology;  Laterality: Right;   EYE SURGERY Bilateral    Cataract Extraction with IOL   hysterectomy (other)  1974   JOINT REPLACEMENT     LEFT knee   KNEE ARTHROPLASTY Right 09/27/2017   Procedure: COMPUTER ASSISTED TOTAL KNEE ARTHROPLASTY;  Surgeon: Donato Heinz, MD;  Location: [facility] ORS;  Service: Orthopedics;  Laterality: Right;   KNEE ARTHROSCOPY Right 09/13/2015   Procedure: ARTHROSCOPY KNEE, PARTIAL LATERAL MENISECTOMY, CHONDROPLASTY ;  Surgeon: Donato Heinz, MD;  Location: [facility] ORS;  Service: Orthopedics;  Laterality: Right;   KNEE SURGERY  2004   having a lot of problems with left knee   NASAL SINUS SURGERY  1967, 1988, 1993, 2003, 2012   sinus procedures  1970,1990,1994,2005   TEE WITHOUT CARDIOVERSION N/A 08/11/2023   Procedure: TRANSESOPHAGEAL ECHOCARDIOGRAM (TEE);  Surgeon: Antonieta Iba, MD;  Location:  [facility] ORS;  Service: Cardiovascular;  Laterality: N/A;   TONSILLECTOMY  2015   TRANSFORAMINAL LUMBAR INTERBODY FUSION (TLIF) WITH PEDICLE SCREW FIXATION 1 LEVEL N/A 06/09/2023   Procedure: TRANSFOAMINAL LUMBAR INTERBODY FUSION,INTERBODY PROSTHESIS,POSTERIOR INSTRUMENTATION LUMBAR FIVE-SACRAL ONE;  Surgeon: Tressie Stalker, MD;  Location: [facility] OR;  Service: Neurosurgery;  Laterality: N/A;    Home Medications:  Allergies as of 11/04/2023       Reactions   Avelox  [moxifloxacin Hcl In Nacl] Other (See Comments)   Redness all over in about 5 minutes after taking   Codeine Nausea Only   Acyclovir And Related    Turn red all over    Mirtazapine Itching   Becomes twitchy and myoclonic   Shrimp [shellfish Allergy]    Hives    Ciprofloxacin Rash   Moxifloxacin Hives, Rash   Sucralfate Rash   Itching         Medication List        Accurate as of November 03, 2023  9:37 AM. If you have any questions, ask your nurse or doctor.          ascorbic acid 500 MG tablet Commonly known as: VITAMIN C Take 1,000 mg by mouth 2 (two) times daily.   aspirin EC 81 MG tablet Take 81 mg by mouth daily.   Calcium Carbonate-Vitamin D 600-400 MG-UNIT tablet Take 1 tablet by mouth 2 (two) times daily.   cetirizine 10 MG tablet Commonly known as: ZYRTEC Take 10 mg by mouth daily.   cyclobenzaprine 5 MG tablet Commonly known as: FLEXERIL Take 1 tablet (5 mg total) by mouth 3 (three) times daily as needed for muscle spasms.   daptomycin 500-0.9 MG/50ML-% Soln Commonly known as: CUBICIN Inject 50 mLs (500 mg total) into the vein daily at 2 PM.   docusate sodium 100 MG capsule Commonly known as: COLACE Take 1 capsule (100 mg total) by mouth 2 (two) times daily.   doxycycline 100 MG tablet Commonly known as: ADOXA Take 1 tablet (100 mg total) by mouth 2 (two) times daily.   estradiol 0.5 MG tablet Commonly known as: ESTRACE Take 0.5 mg by mouth daily.   fluticasone 50 MCG/ACT nasal spray Commonly known as: FLONASE Place 1 spray into both nostrils in the morning and at bedtime.   gabapentin 300 MG capsule Commonly known as: NEURONTIN Take 300 mg by mouth at bedtime.   hydrochlorothiazide 25 MG tablet Commonly known as: HYDRODIURIL Take 25 mg by mouth as needed.   HYDROcodone-acetaminophen 5-325 MG tablet Commonly known as: NORCO/VICODIN Take by mouth.   lansoprazole 30 MG capsule Commonly known as: PREVACID Take 30 mg by mouth  daily.   Melatonin 10 MG Tabs Take 10 mg by mouth at bedtime.   metoCLOPramide 5 MG tablet Commonly known as: REGLAN Take 5 mg by mouth every 8 (eight) hours as needed for vomiting or nausea.   metoprolol succinate 25 MG 24 hr tablet Commonly known as: TOPROL-XL Take 1 tablet by mouth daily.   morphine 15 MG 12 hr tablet Commonly known as: MS CONTIN Take 1 tablet (15 mg total) by mouth every 12 (twelve) hours.   oxyCODONE-acetaminophen 5-325 MG tablet Commonly known as: PERCOCET/ROXICET Take 1 tablet by mouth every 4 (four) hours as needed for moderate pain (pain score 4-6).   sucralfate 1 g tablet Commonly known as: CARAFATE Take 1 g by mouth 4 (four) times daily.   tamsulosin 0.4 MG Caps capsule Commonly known as: FLOMAX Take 1 capsule (0.4 mg  total) by mouth daily after supper.   topiramate 50 MG tablet Commonly known as: TOPAMAX Take 150 mg by mouth daily.        Allergies:  Allergies  Allergen Reactions   Avelox [Moxifloxacin Hcl In Nacl] Other (See Comments)    Redness all over in about 5 minutes after taking   Codeine Nausea Only   Acyclovir And Related     Turn red all over    Mirtazapine Itching    Becomes twitchy and myoclonic   Shrimp [Shellfish Allergy]     Hives     Ciprofloxacin Rash   Moxifloxacin Hives and Rash   Sucralfate Rash    Itching     Family History: Family History  Problem Relation Age of Onset   Heart attack Mother 78   Heart failure Mother    Heart attack Father    Heart disease Father        CABG   Heart attack Brother 70       premature   Breast cancer Sister 55    Social History:  reports that she has never smoked. She has never used smokeless tobacco. She reports that she does not drink alcohol and does not use drugs.  ROS: Pertinent ROS in HPI  Physical Exam: There were no vitals taken for this visit.  Constitutional:  Well nourished. Alert and oriented, No acute distress. HEENT: [place] AT, moist mucus  membranes.  Trachea midline, no masses. Cardiovascular: No clubbing, cyanosis, or edema. Respiratory: Normal respiratory effort, no increased work of breathing. GU: No CVA tenderness.  No bladder fullness or masses.  Recession of labia minora, dry, pale vulvar vaginal mucosa and loss of mucosal ridges and folds.  Normal urethral meatus, no lesions, no prolapse, no discharge.   No urethral masses, tenderness and/or tenderness. No bladder fullness, tenderness or masses. *** vagina mucosa, *** estrogen effect, no discharge, no lesions, *** pelvic support, *** cystocele and *** rectocele noted.  No cervical motion tenderness.  Uterus is freely mobile and non-fixed.  No adnexal/parametria masses or tenderness noted.  Anus and perineum are without rashes or lesions.   ***  Neurologic: Grossly intact, no focal deficits, moving all 4 extremities. Psychiatric: Normal mood and affect.    Laboratory Data: CMP     Component Value Date/Time   NA 137 10/29/2023 1133   NA 140 09/14/2012 0438   K 3.4 (L) 10/29/2023 1133   K 4.2 09/14/2012 0438   CL 107 10/29/2023 1133   CL 113 (H) 09/14/2012 0438   CO2 20 (L) 10/29/2023 1133   CO2 21 09/14/2012 0438   GLUCOSE 120 (H) 10/29/2023 1133   GLUCOSE 75 09/14/2012 0438   BUN 24 (H) 10/29/2023 1133   BUN 8 09/14/2012 0438   CREATININE 0.81 10/29/2023 1133   CREATININE 0.75 09/14/2012 0438   CALCIUM 9.1 10/29/2023 1133   CALCIUM 8.3 (L) 09/14/2012 0438   PROT 6.6 10/29/2023 1133   ALBUMIN 2.6 (L) 10/29/2023 1133   AST 20 10/29/2023 1133   ALT 14 10/29/2023 1133   ALKPHOS 134 (H) 10/29/2023 1133   BILITOT 0.5 10/29/2023 1133   EGFR 80.0 09/20/2023 1004   GFRNONAA >60 10/29/2023 1133   GFRNONAA >60 09/14/2012 0438    CBC    Component Value Date/Time   WBC 10.9 (H) 10/29/2023 1133   RBC 3.66 (L) 10/29/2023 1133   HGB 10.4 (L) 10/29/2023 1133   HGB 10.9 (L) 09/14/2012 0438   HCT 32.7 (L) 10/29/2023 1133  HCT 38.6 08/29/2012 0939   PLT 338 10/29/2023  1133   PLT 147 (L) 09/13/2012 0401   MCV 89.3 10/29/2023 1133   MCV 97 08/29/2012 0939   MCH 28.4 10/29/2023 1133   MCHC 31.8 10/29/2023 1133   RDW 17.5 (H) 10/29/2023 1133   RDW 13.5 08/29/2012 0939   LYMPHSABS 1.7 10/29/2023 1133   MONOABS 0.7 10/29/2023 1133   EOSABS 0.2 10/29/2023 1133   BASOSABS 0.1 10/29/2023 1133  I have reviewed the labs.   Pertinent Imaging: ***  Assessment & Plan:    1. Urinary retention - foley catheter removed for voiding trial this am  -Discussed with patient that her PVR was enough that we could consider replacing the catheter, but that would come with with reinfection or we could give her more time to see if she voids on her own later tonight.  Her facility is able to BladderScan her and place the catheter if necessary.  We decided on the latter and they will BladderScan her at 8:00 tonight and if her residual is greater than 300 they will place a Foley catheter.  If she is able to void on her own she will follow-up in 1 month's time.  If they do have to place a catheter, they will contact us and we will schedule an appointment to discuss her options going forward  2. Bilateral nephrolithiasis -Asymptomatic at this visit   No follow-ups on file.  These notes generated with voice recognition software. I apologize for typographical errors.  Cloretta Ned  [facility] [address]  [address] [place], [place] [address] [phone]

## 2023-11-04 ENCOUNTER — Other Ambulatory Visit: Payer: Self-pay | Admitting: Infectious Diseases

## 2023-11-04 ENCOUNTER — Ambulatory Visit: Payer: PPO | Admitting: Urology

## 2023-11-04 MED ORDER — DOXYCYCLINE MONOHYDRATE 100 MG PO TABS: 100.0000 mg | ORAL_TABLET | Freq: Two times a day (BID) | ORAL | 6 refills | Status: DC

## 2023-11-04 NOTE — Progress Notes (Signed)
Spoke to Daughter and explained the current labs of ESR 31 and CRP <1 Significantly better PT  had MRSA bacteremia L4L5 discitis in Nov 2024 On 06/09/23 she had L5-s1 decompression with fusion and has hardware Pt got 6 weeks of Dapt and now on PO doxy which I would like to continue for a prolonged period because of NRSA and close proximity to hardware She has a pressure wound lower thoracic area which as per daughter is improving- she has a follow up appt with Dr.Miller I told her to follow up with Dr.Jenkins neurosurgeon at [facility]  Prescription for doxy with 6 refills sent

## 2023-11-09 NOTE — Progress Notes (Signed)
 11/10/2023 8:29 PM   Jocelyn Hooper 1940-09-25 [id number]  Referring provider: Danella Penton, MD [phone] [facility] [address] [facility] [place],  [place] [address]  Urological history: 1. Urge incontinence -contributing factors of age, GSM, and lumbar DDD, diuretics and pelvic surgery  2. Nephrolithiasis -stone composition calcium oxalate -ESWL, 12/2020 -CT renal stone study (01/2022) -nonobstructive bilateral nephrolithiasis  3. rUTI's -Contributing factors of age, GSM, incontinence and constipation -Documented urine cultures over the last year   NONE    Chief Complaint  Patient presents with   Follow-up   HPI: Jocelyn Hooper is a 83 y.o. female who presents today for voiding trial with her daughter, Jocelyn Hooper.    Previous records reviewed.   Her daughter removed her catheter this morning.   She drank 2 large daily cups of water and has voided twice.  Patient denies any modifying or aggravating factors.  Patient denies any recent UTI's, gross hematuria, dysuria or suprapubic/flank pain.  Patient denies any fevers, chills, nausea or vomiting.    PVR 124 mL   PMH: Past Medical History:  Diagnosis Date   Bursitis of left shoulder    Chest pain, unspecified    Complication of anesthesia    PT HAS SEVERE SCOLIOSIS AND FOR TKR ANESTHESIA COULD NOT GET A SPINAL   Coronary artery disease    ascvd   CSF leak    DDD (degenerative disc disease), cervical    Diverticulosis    Esophageal reflux    Fibrocystic breast disease    GERD (gastroesophageal reflux disease)    Headache    H/O   History of colon polyps    History of hiatal hernia    History of kidney stones    IBS (irritable bowel syndrome)    Nasal polyps    Other and unspecified hyperlipidemia    Scoliosis    Tinnitus of both ears    Trigeminal neuralgia syndrome    Unspecified sinusitis (chronic)    Vitamin B12 deficiency     Surgical History: Past Surgical History:  Procedure Laterality  Date   ABDOMINAL HYSTERECTOMY     APPENDECTOMY     BACK SURGERY  1982   disc   CARDIAC CATHETERIZATION     [facility]    CHOLECYSTECTOMY  2002   COLONOSCOPY WITH PROPOFOL N/A 03/10/2017   Procedure: COLONOSCOPY WITH PROPOFOL;  Surgeon: Scot Jun, MD;  Location: [facility] ENDOSCOPY;  Service: Endoscopy;  Laterality: N/A;   COLONOSCOPY WITH PROPOFOL N/A 01/13/2021   Procedure: COLONOSCOPY WITH PROPOFOL;  Surgeon: Toledo, Boykin Nearing, MD;  Location: [facility] ENDOSCOPY;  Service: Gastroenterology;  Laterality: N/A;   ESOPHAGOGASTRODUODENOSCOPY (EGD) WITH PROPOFOL N/A 01/13/2021   Procedure: ESOPHAGOGASTRODUODENOSCOPY (EGD) WITH PROPOFOL;  Surgeon: Toledo, Boykin Nearing, MD;  Location: [facility] ENDOSCOPY;  Service: Gastroenterology;  Laterality: N/A;   EXTRACORPOREAL SHOCK WAVE LITHOTRIPSY Right 01/09/2021   Procedure: EXTRACORPOREAL SHOCK WAVE LITHOTRIPSY (ESWL);  Surgeon: Vanna Scotland, MD;  Location: [facility] ORS;  Service: Urology;  Laterality: Right;   EYE SURGERY Bilateral    Cataract Extraction with IOL   hysterectomy (other)  1974   JOINT REPLACEMENT     LEFT knee   KNEE ARTHROPLASTY Right 09/27/2017   Procedure: COMPUTER ASSISTED TOTAL KNEE ARTHROPLASTY;  Surgeon: Donato Heinz, MD;  Location: [facility] ORS;  Service: Orthopedics;  Laterality: Right;   KNEE ARTHROSCOPY Right 09/13/2015   Procedure: ARTHROSCOPY KNEE, PARTIAL LATERAL MENISECTOMY, CHONDROPLASTY ;  Surgeon: Donato Heinz, MD;  Location: [facility] ORS;  Service: Orthopedics;  Laterality: Right;   KNEE SURGERY  2004   having a lot of problems with left knee   NASAL SINUS SURGERY  1967, 1988, 1993, 2003, 2012   sinus procedures  1970,1990,1994,2005   TEE WITHOUT CARDIOVERSION N/A 08/11/2023   Procedure: TRANSESOPHAGEAL ECHOCARDIOGRAM (TEE);  Surgeon: Antonieta Iba, MD;  Location: [facility] ORS;  Service: Cardiovascular;  Laterality: N/A;   TONSILLECTOMY  2015   TRANSFORAMINAL LUMBAR INTERBODY FUSION (TLIF) WITH PEDICLE SCREW FIXATION 1  LEVEL N/A 06/09/2023   Procedure: TRANSFOAMINAL LUMBAR INTERBODY FUSION,INTERBODY PROSTHESIS,POSTERIOR INSTRUMENTATION LUMBAR FIVE-SACRAL ONE;  Surgeon: Tressie Stalker, MD;  Location: [facility] OR;  Service: Neurosurgery;  Laterality: N/A;    Home Medications:  Allergies as of 11/10/2023       Reactions   Avelox [moxifloxacin Hcl In Nacl] Other (See Comments)   Redness all over in about 5 minutes after taking   Codeine Nausea Only   Acyclovir And Related    Turn red all over    Mirtazapine Itching   Becomes twitchy and myoclonic   Shrimp [shellfish Allergy]    Hives    Ciprofloxacin Rash   Moxifloxacin Hives, Rash   Sucralfate Rash   Itching         Medication List        Accurate as of November 10, 2023 11:59 PM. If you have any questions, ask your nurse or doctor.          ascorbic acid 500 MG tablet Commonly known as: VITAMIN C Take 1,000 mg by mouth 2 (two) times daily.   aspirin EC 81 MG tablet Take 81 mg by mouth daily.   Calcium Carbonate-Vitamin D 600-400 MG-UNIT tablet Take 1 tablet by mouth 2 (two) times daily.   cetirizine 10 MG tablet Commonly known as: ZYRTEC Take 10 mg by mouth daily.   cyclobenzaprine 5 MG tablet Commonly known as: FLEXERIL Take 1 tablet (5 mg total) by mouth 3 (three) times daily as needed for muscle spasms.   daptomycin 500-0.9 MG/50ML-% Soln Commonly known as: CUBICIN Inject 50 mLs (500 mg total) into the vein daily at 2 PM.   docusate sodium 100 MG capsule Commonly known as: COLACE Take 1 capsule (100 mg total) by mouth 2 (two) times daily.   doxycycline 100 MG tablet Commonly known as: ADOXA Take 1 tablet (100 mg total) by mouth 2 (two) times daily.   estradiol 0.5 MG tablet Commonly known as: ESTRACE Take 0.5 mg by mouth daily.   fluticasone 50 MCG/ACT nasal spray Commonly known as: FLONASE Place 1 spray into both nostrils in the morning and at bedtime.   gabapentin 300 MG capsule Commonly known as:  NEURONTIN Take 300 mg by mouth at bedtime.   hydrochlorothiazide 25 MG tablet Commonly known as: HYDRODIURIL Take 25 mg by mouth as needed.   HYDROcodone-acetaminophen 5-325 MG tablet Commonly known as: NORCO/VICODIN Take by mouth.   lansoprazole 30 MG capsule Commonly known as: PREVACID Take 30 mg by mouth daily.   Melatonin 10 MG Tabs Take 10 mg by mouth at bedtime.   metoCLOPramide 5 MG tablet Commonly known as: REGLAN Take 5 mg by mouth every 8 (eight) hours as needed for vomiting or nausea.   metoprolol succinate 25 MG 24 hr tablet Commonly known as: TOPROL-XL Take 1 tablet by mouth daily.   morphine 15 MG 12 hr tablet Commonly known as: MS CONTIN Take 1 tablet (15 mg total) by mouth every 12 (twelve) hours.   oxyCODONE-acetaminophen 5-325  MG tablet Commonly known as: PERCOCET/ROXICET Take 1 tablet by mouth every 4 (four) hours as needed for moderate pain (pain score 4-6).   sucralfate 1 g tablet Commonly known as: CARAFATE Take 1 g by mouth 4 (four) times daily.   tamsulosin 0.4 MG Caps capsule Commonly known as: FLOMAX Take 1 capsule (0.4 mg total) by mouth daily after supper.   topiramate 50 MG tablet Commonly known as: TOPAMAX Take 150 mg by mouth daily.        Allergies:  Allergies  Allergen Reactions   Avelox [Moxifloxacin Hcl In Nacl] Other (See Comments)    Redness all over in about 5 minutes after taking   Codeine Nausea Only   Acyclovir And Related     Turn red all over    Mirtazapine Itching    Becomes twitchy and myoclonic   Shrimp [Shellfish Allergy]     Hives     Ciprofloxacin Rash   Moxifloxacin Hives and Rash   Sucralfate Rash    Itching     Family History: Family History  Problem Relation Age of Onset   Heart attack Mother 41   Heart failure Mother    Heart attack Father    Heart disease Father        CABG   Heart attack Brother 38       premature   Breast cancer Sister 9    Social History:  reports that she has  never smoked. She has never used smokeless tobacco. She reports that she does not drink alcohol and does not use drugs.  ROS: Pertinent ROS in HPI  Physical Exam: BP 94/68   Pulse (!) 112   Constitutional:  Well nourished. Alert and oriented, No acute distress. HEENT: [place] AT, moist mucus membranes.  Trachea midline, no masses. Cardiovascular: No clubbing, cyanosis, or edema. Respiratory: Normal respiratory effort, no increased work of breathing. Neurologic: Grossly intact, no focal deficits, moving all 4 extremities. Psychiatric: Normal mood and affect.    Laboratory Data: CMP     Component Value Date/Time   NA 137 10/29/2023 1133   NA 140 09/14/2012 0438   K 3.4 (L) 10/29/2023 1133   K 4.2 09/14/2012 0438   CL 107 10/29/2023 1133   CL 113 (H) 09/14/2012 0438   CO2 20 (L) 10/29/2023 1133   CO2 21 09/14/2012 0438   GLUCOSE 120 (H) 10/29/2023 1133   GLUCOSE 75 09/14/2012 0438   BUN 24 (H) 10/29/2023 1133   BUN 8 09/14/2012 0438   CREATININE 0.81 10/29/2023 1133   CREATININE 0.75 09/14/2012 0438   CALCIUM 9.1 10/29/2023 1133   CALCIUM 8.3 (L) 09/14/2012 0438   PROT 6.6 10/29/2023 1133   ALBUMIN 2.6 (L) 10/29/2023 1133   AST 20 10/29/2023 1133   ALT 14 10/29/2023 1133   ALKPHOS 134 (H) 10/29/2023 1133   BILITOT 0.5 10/29/2023 1133   EGFR 80.0 09/20/2023 1004   GFRNONAA >60 10/29/2023 1133   GFRNONAA >60 09/14/2012 0438    CBC    Component Value Date/Time   WBC 10.9 (H) 10/29/2023 1133   RBC 3.66 (L) 10/29/2023 1133   HGB 10.4 (L) 10/29/2023 1133   HGB 10.9 (L) 09/14/2012 0438   HCT 32.7 (L) 10/29/2023 1133   HCT 38.6 08/29/2012 0939   PLT 338 10/29/2023 1133   PLT 147 (L) 09/13/2012 0401   MCV 89.3 10/29/2023 1133   MCV 97 08/29/2012 0939   MCH 28.4 10/29/2023 1133   MCHC 31.8 10/29/2023 1133  RDW 17.5 (H) 10/29/2023 1133   RDW 13.5 08/29/2012 0939   LYMPHSABS 1.7 10/29/2023 1133   MONOABS 0.7 10/29/2023 1133   EOSABS 0.2 10/29/2023 1133   BASOSABS 0.1  10/29/2023 1133  I have reviewed the labs.   Pertinent Imaging:  11/10/23 16:09  Scan Result 124 ml    Assessment & Plan:    1. Urinary retention - foley catheter removed for voiding trial this am  - She has been voiding and drinking fluids - PVR is acceptable -Her daughter is a Engineer, civil (consulting) and is comfortable with catheterization, I have given her both's female straight caths and a catheter insertion kit along with a 60 Jocelyn Foley in case she should go into retention again -I like to follow-up with her in 1 month for symptom recheck and PVR recheck  2. Bilateral nephrolithiasis -Asymptomatic at this visit   Return in about 1 month (around 12/08/2023) for PVR .  These notes generated with voice recognition software. I apologize for typographical errors.  Cloretta Ned  [facility] [address]  [address] [place], [place] [address] [phone]

## 2023-11-10 ENCOUNTER — Ambulatory Visit (INDEPENDENT_AMBULATORY_CARE_PROVIDER_SITE_OTHER): Payer: PPO | Admitting: Urology

## 2023-11-10 VITALS — BP 94/68 | HR 112

## 2023-11-10 DIAGNOSIS — R338 Other retention of urine: Secondary | ICD-10-CM | POA: Diagnosis not present

## 2023-11-10 DIAGNOSIS — S91103D Unspecified open wound of unspecified great toe without damage to nail, subsequent encounter: Secondary | ICD-10-CM | POA: Diagnosis not present

## 2023-11-10 DIAGNOSIS — N200 Calculus of kidney: Secondary | ICD-10-CM

## 2023-11-10 DIAGNOSIS — L89102 Pressure ulcer of unspecified part of back, stage 2: Secondary | ICD-10-CM | POA: Diagnosis not present

## 2023-11-10 DIAGNOSIS — L89103 Pressure ulcer of unspecified part of back, stage 3: Secondary | ICD-10-CM | POA: Diagnosis not present

## 2023-11-10 DIAGNOSIS — L89312 Pressure ulcer of right buttock, stage 2: Secondary | ICD-10-CM | POA: Diagnosis not present

## 2023-11-10 DIAGNOSIS — L89313 Pressure ulcer of right buttock, stage 3: Secondary | ICD-10-CM | POA: Diagnosis not present

## 2023-11-10 LAB — BLADDER SCAN AMB NON-IMAGING: Scan Result: 124

## 2023-11-14 ENCOUNTER — Encounter: Payer: Self-pay | Admitting: Urology

## 2023-11-18 ENCOUNTER — Other Ambulatory Visit: Payer: Self-pay | Admitting: Family Medicine

## 2023-11-18 DIAGNOSIS — I5041 Acute combined systolic (congestive) and diastolic (congestive) heart failure: Secondary | ICD-10-CM | POA: Diagnosis not present

## 2023-11-18 DIAGNOSIS — M5416 Radiculopathy, lumbar region: Secondary | ICD-10-CM | POA: Diagnosis not present

## 2023-11-18 DIAGNOSIS — Z79899 Other long term (current) drug therapy: Secondary | ICD-10-CM | POA: Diagnosis not present

## 2023-11-18 DIAGNOSIS — M62830 Muscle spasm of back: Secondary | ICD-10-CM | POA: Diagnosis not present

## 2023-11-18 DIAGNOSIS — E538 Deficiency of other specified B group vitamins: Secondary | ICD-10-CM | POA: Diagnosis not present

## 2023-11-18 DIAGNOSIS — M48062 Spinal stenosis, lumbar region with neurogenic claudication: Secondary | ICD-10-CM | POA: Diagnosis not present

## 2023-11-18 DIAGNOSIS — R739 Hyperglycemia, unspecified: Secondary | ICD-10-CM | POA: Diagnosis not present

## 2023-11-18 DIAGNOSIS — Z0000 Encounter for general adult medical examination without abnormal findings: Secondary | ICD-10-CM | POA: Diagnosis not present

## 2023-11-18 DIAGNOSIS — M4646 Discitis, unspecified, lumbar region: Secondary | ICD-10-CM | POA: Diagnosis not present

## 2023-11-18 DIAGNOSIS — I38 Endocarditis, valve unspecified: Secondary | ICD-10-CM | POA: Diagnosis not present

## 2023-11-23 ENCOUNTER — Ambulatory Visit

## 2023-11-23 DIAGNOSIS — L89103 Pressure ulcer of unspecified part of back, stage 3: Secondary | ICD-10-CM | POA: Diagnosis not present

## 2023-11-23 DIAGNOSIS — L89313 Pressure ulcer of right buttock, stage 3: Secondary | ICD-10-CM | POA: Diagnosis not present

## 2023-11-23 DIAGNOSIS — L89312 Pressure ulcer of right buttock, stage 2: Secondary | ICD-10-CM | POA: Diagnosis not present

## 2023-11-23 DIAGNOSIS — S91103D Unspecified open wound of unspecified great toe without damage to nail, subsequent encounter: Secondary | ICD-10-CM | POA: Diagnosis not present

## 2023-11-23 DIAGNOSIS — L89102 Pressure ulcer of unspecified part of back, stage 2: Secondary | ICD-10-CM | POA: Diagnosis not present

## 2023-11-26 DIAGNOSIS — M4317 Spondylolisthesis, lumbosacral region: Secondary | ICD-10-CM | POA: Diagnosis not present

## 2023-11-26 DIAGNOSIS — R0989 Other specified symptoms and signs involving the circulatory and respiratory systems: Secondary | ICD-10-CM | POA: Diagnosis not present

## 2023-11-26 DIAGNOSIS — L89313 Pressure ulcer of right buttock, stage 3: Secondary | ICD-10-CM | POA: Diagnosis not present

## 2023-11-26 DIAGNOSIS — M4646 Discitis, unspecified, lumbar region: Secondary | ICD-10-CM | POA: Diagnosis not present

## 2023-11-26 DIAGNOSIS — L89152 Pressure ulcer of sacral region, stage 2: Secondary | ICD-10-CM | POA: Diagnosis not present

## 2023-11-26 DIAGNOSIS — M4626 Osteomyelitis of vertebra, lumbar region: Secondary | ICD-10-CM | POA: Diagnosis not present

## 2023-11-26 DIAGNOSIS — L89156 Pressure-induced deep tissue damage of sacral region: Secondary | ICD-10-CM | POA: Diagnosis not present

## 2023-11-26 DIAGNOSIS — L89100 Pressure ulcer of unspecified part of back, unstageable: Secondary | ICD-10-CM | POA: Diagnosis not present

## 2023-11-26 DIAGNOSIS — A419 Sepsis, unspecified organism: Secondary | ICD-10-CM | POA: Diagnosis not present

## 2023-11-30 ENCOUNTER — Ambulatory Visit
Admission: RE | Admit: 2023-11-30 | Discharge: 2023-11-30 | Disposition: A | Discharge status: Home / Self Care | Source: Ambulatory Visit | Attending: Family Medicine | Admitting: Family Medicine

## 2023-11-30 DIAGNOSIS — R609 Edema, unspecified: Secondary | ICD-10-CM | POA: Diagnosis not present

## 2023-11-30 DIAGNOSIS — M5416 Radiculopathy, lumbar region: Secondary | ICD-10-CM | POA: Insufficient documentation

## 2023-11-30 DIAGNOSIS — M4856XA Collapsed vertebra, not elsewhere classified, lumbar region, initial encounter for fracture: Secondary | ICD-10-CM | POA: Diagnosis not present

## 2023-11-30 DIAGNOSIS — M4857XA Collapsed vertebra, not elsewhere classified, lumbosacral region, initial encounter for fracture: Secondary | ICD-10-CM | POA: Diagnosis not present

## 2023-11-30 DIAGNOSIS — M48061 Spinal stenosis, lumbar region without neurogenic claudication: Secondary | ICD-10-CM | POA: Diagnosis not present

## 2023-11-30 MED ORDER — GADOBUTROL 1 MMOL/ML IV SOLN
5.0000 mL | Freq: Once | INTRAVENOUS | Status: AC | PRN
  Administered 2023-11-30: 5 mL via INTRAVENOUS

## 2023-12-04 DIAGNOSIS — L89103 Pressure ulcer of unspecified part of back, stage 3: Secondary | ICD-10-CM | POA: Diagnosis not present

## 2023-12-04 DIAGNOSIS — L89102 Pressure ulcer of unspecified part of back, stage 2: Secondary | ICD-10-CM | POA: Diagnosis not present

## 2023-12-04 DIAGNOSIS — S91103D Unspecified open wound of unspecified great toe without damage to nail, subsequent encounter: Secondary | ICD-10-CM | POA: Diagnosis not present

## 2023-12-04 DIAGNOSIS — L89312 Pressure ulcer of right buttock, stage 2: Secondary | ICD-10-CM | POA: Diagnosis not present

## 2023-12-04 DIAGNOSIS — L89313 Pressure ulcer of right buttock, stage 3: Secondary | ICD-10-CM | POA: Diagnosis not present

## 2023-12-08 ENCOUNTER — Ambulatory Visit: Payer: PPO | Admitting: Urology

## 2023-12-14 DIAGNOSIS — L89103 Pressure ulcer of unspecified part of back, stage 3: Secondary | ICD-10-CM | POA: Diagnosis not present

## 2023-12-14 DIAGNOSIS — L89312 Pressure ulcer of right buttock, stage 2: Secondary | ICD-10-CM | POA: Diagnosis not present

## 2023-12-14 DIAGNOSIS — S91103D Unspecified open wound of unspecified great toe without damage to nail, subsequent encounter: Secondary | ICD-10-CM | POA: Diagnosis not present

## 2023-12-14 DIAGNOSIS — L89313 Pressure ulcer of right buttock, stage 3: Secondary | ICD-10-CM | POA: Diagnosis not present

## 2023-12-14 DIAGNOSIS — L89102 Pressure ulcer of unspecified part of back, stage 2: Secondary | ICD-10-CM | POA: Diagnosis not present

## 2023-12-16 DIAGNOSIS — L89103 Pressure ulcer of unspecified part of back, stage 3: Secondary | ICD-10-CM | POA: Diagnosis not present

## 2023-12-16 DIAGNOSIS — L89312 Pressure ulcer of right buttock, stage 2: Secondary | ICD-10-CM | POA: Diagnosis not present

## 2023-12-16 DIAGNOSIS — S91103D Unspecified open wound of unspecified great toe without damage to nail, subsequent encounter: Secondary | ICD-10-CM | POA: Diagnosis not present

## 2023-12-16 DIAGNOSIS — L89102 Pressure ulcer of unspecified part of back, stage 2: Secondary | ICD-10-CM | POA: Diagnosis not present

## 2023-12-16 DIAGNOSIS — L89313 Pressure ulcer of right buttock, stage 3: Secondary | ICD-10-CM | POA: Diagnosis not present

## 2023-12-22 DIAGNOSIS — M4317 Spondylolisthesis, lumbosacral region: Secondary | ICD-10-CM | POA: Diagnosis not present

## 2023-12-22 DIAGNOSIS — D509 Iron deficiency anemia, unspecified: Secondary | ICD-10-CM | POA: Diagnosis not present

## 2023-12-22 DIAGNOSIS — L89522 Pressure ulcer of left ankle, stage 2: Secondary | ICD-10-CM | POA: Diagnosis not present

## 2023-12-22 DIAGNOSIS — M5030 Other cervical disc degeneration, unspecified cervical region: Secondary | ICD-10-CM | POA: Diagnosis not present

## 2023-12-22 DIAGNOSIS — D519 Vitamin B12 deficiency anemia, unspecified: Secondary | ICD-10-CM | POA: Diagnosis not present

## 2023-12-22 DIAGNOSIS — L89152 Pressure ulcer of sacral region, stage 2: Secondary | ICD-10-CM | POA: Diagnosis not present

## 2023-12-22 DIAGNOSIS — M419 Scoliosis, unspecified: Secondary | ICD-10-CM | POA: Diagnosis not present

## 2023-12-27 DIAGNOSIS — L89152 Pressure ulcer of sacral region, stage 2: Secondary | ICD-10-CM | POA: Diagnosis not present

## 2023-12-27 DIAGNOSIS — M4317 Spondylolisthesis, lumbosacral region: Secondary | ICD-10-CM | POA: Diagnosis not present

## 2023-12-27 DIAGNOSIS — L89313 Pressure ulcer of right buttock, stage 3: Secondary | ICD-10-CM | POA: Diagnosis not present

## 2023-12-27 DIAGNOSIS — L89100 Pressure ulcer of unspecified part of back, unstageable: Secondary | ICD-10-CM | POA: Diagnosis not present

## 2023-12-27 DIAGNOSIS — M4626 Osteomyelitis of vertebra, lumbar region: Secondary | ICD-10-CM | POA: Diagnosis not present

## 2023-12-27 DIAGNOSIS — L89156 Pressure-induced deep tissue damage of sacral region: Secondary | ICD-10-CM | POA: Diagnosis not present

## 2023-12-27 DIAGNOSIS — R0989 Other specified symptoms and signs involving the circulatory and respiratory systems: Secondary | ICD-10-CM | POA: Diagnosis not present

## 2023-12-27 DIAGNOSIS — M4646 Discitis, unspecified, lumbar region: Secondary | ICD-10-CM | POA: Diagnosis not present

## 2023-12-27 DIAGNOSIS — A419 Sepsis, unspecified organism: Secondary | ICD-10-CM | POA: Diagnosis not present

## 2023-12-29 DIAGNOSIS — R7989 Other specified abnormal findings of blood chemistry: Secondary | ICD-10-CM | POA: Diagnosis not present

## 2023-12-29 DIAGNOSIS — E782 Mixed hyperlipidemia: Secondary | ICD-10-CM | POA: Diagnosis not present

## 2023-12-29 DIAGNOSIS — R739 Hyperglycemia, unspecified: Secondary | ICD-10-CM | POA: Diagnosis not present

## 2023-12-29 DIAGNOSIS — E538 Deficiency of other specified B group vitamins: Secondary | ICD-10-CM | POA: Diagnosis not present

## 2023-12-31 DIAGNOSIS — L89313 Pressure ulcer of right buttock, stage 3: Secondary | ICD-10-CM | POA: Diagnosis not present

## 2023-12-31 DIAGNOSIS — L89102 Pressure ulcer of unspecified part of back, stage 2: Secondary | ICD-10-CM | POA: Diagnosis not present

## 2023-12-31 DIAGNOSIS — L89103 Pressure ulcer of unspecified part of back, stage 3: Secondary | ICD-10-CM | POA: Diagnosis not present

## 2023-12-31 DIAGNOSIS — L89312 Pressure ulcer of right buttock, stage 2: Secondary | ICD-10-CM | POA: Diagnosis not present

## 2023-12-31 DIAGNOSIS — S91103D Unspecified open wound of unspecified great toe without damage to nail, subsequent encounter: Secondary | ICD-10-CM | POA: Diagnosis not present

## 2024-01-03 DIAGNOSIS — E782 Mixed hyperlipidemia: Secondary | ICD-10-CM | POA: Diagnosis not present

## 2024-01-05 DIAGNOSIS — M4646 Discitis, unspecified, lumbar region: Secondary | ICD-10-CM | POA: Diagnosis not present

## 2024-01-05 DIAGNOSIS — D500 Iron deficiency anemia secondary to blood loss (chronic): Secondary | ICD-10-CM | POA: Diagnosis not present

## 2024-01-05 DIAGNOSIS — I5041 Acute combined systolic (congestive) and diastolic (congestive) heart failure: Secondary | ICD-10-CM | POA: Diagnosis not present

## 2024-01-05 DIAGNOSIS — E538 Deficiency of other specified B group vitamins: Secondary | ICD-10-CM | POA: Diagnosis not present

## 2024-01-05 DIAGNOSIS — I38 Endocarditis, valve unspecified: Secondary | ICD-10-CM | POA: Diagnosis not present

## 2024-01-05 DIAGNOSIS — R531 Weakness: Secondary | ICD-10-CM | POA: Diagnosis not present

## 2024-01-19 DIAGNOSIS — L89102 Pressure ulcer of unspecified part of back, stage 2: Secondary | ICD-10-CM | POA: Diagnosis not present

## 2024-01-19 DIAGNOSIS — S91103D Unspecified open wound of unspecified great toe without damage to nail, subsequent encounter: Secondary | ICD-10-CM | POA: Diagnosis not present

## 2024-01-19 DIAGNOSIS — L89312 Pressure ulcer of right buttock, stage 2: Secondary | ICD-10-CM | POA: Diagnosis not present

## 2024-01-19 DIAGNOSIS — L89103 Pressure ulcer of unspecified part of back, stage 3: Secondary | ICD-10-CM | POA: Diagnosis not present

## 2024-01-19 DIAGNOSIS — L89313 Pressure ulcer of right buttock, stage 3: Secondary | ICD-10-CM | POA: Diagnosis not present

## 2024-01-25 DIAGNOSIS — L89312 Pressure ulcer of right buttock, stage 2: Secondary | ICD-10-CM | POA: Diagnosis not present

## 2024-01-25 DIAGNOSIS — L89103 Pressure ulcer of unspecified part of back, stage 3: Secondary | ICD-10-CM | POA: Diagnosis not present

## 2024-01-25 DIAGNOSIS — L89313 Pressure ulcer of right buttock, stage 3: Secondary | ICD-10-CM | POA: Diagnosis not present

## 2024-01-25 DIAGNOSIS — S91103D Unspecified open wound of unspecified great toe without damage to nail, subsequent encounter: Secondary | ICD-10-CM | POA: Diagnosis not present

## 2024-01-25 DIAGNOSIS — L89102 Pressure ulcer of unspecified part of back, stage 2: Secondary | ICD-10-CM | POA: Diagnosis not present

## 2024-01-26 DIAGNOSIS — M4646 Discitis, unspecified, lumbar region: Secondary | ICD-10-CM | POA: Diagnosis not present

## 2024-01-26 DIAGNOSIS — L89100 Pressure ulcer of unspecified part of back, unstageable: Secondary | ICD-10-CM | POA: Diagnosis not present

## 2024-01-26 DIAGNOSIS — A419 Sepsis, unspecified organism: Secondary | ICD-10-CM | POA: Diagnosis not present

## 2024-01-26 DIAGNOSIS — L89156 Pressure-induced deep tissue damage of sacral region: Secondary | ICD-10-CM | POA: Diagnosis not present

## 2024-01-26 DIAGNOSIS — M4626 Osteomyelitis of vertebra, lumbar region: Secondary | ICD-10-CM | POA: Diagnosis not present

## 2024-01-26 DIAGNOSIS — R0989 Other specified symptoms and signs involving the circulatory and respiratory systems: Secondary | ICD-10-CM | POA: Diagnosis not present

## 2024-01-26 DIAGNOSIS — L89313 Pressure ulcer of right buttock, stage 3: Secondary | ICD-10-CM | POA: Diagnosis not present

## 2024-01-26 DIAGNOSIS — M4317 Spondylolisthesis, lumbosacral region: Secondary | ICD-10-CM | POA: Diagnosis not present

## 2024-01-26 DIAGNOSIS — L89152 Pressure ulcer of sacral region, stage 2: Secondary | ICD-10-CM | POA: Diagnosis not present

## 2024-02-01 DIAGNOSIS — L89312 Pressure ulcer of right buttock, stage 2: Secondary | ICD-10-CM | POA: Diagnosis not present

## 2024-02-01 DIAGNOSIS — L89103 Pressure ulcer of unspecified part of back, stage 3: Secondary | ICD-10-CM | POA: Diagnosis not present

## 2024-02-01 DIAGNOSIS — S91103D Unspecified open wound of unspecified great toe without damage to nail, subsequent encounter: Secondary | ICD-10-CM | POA: Diagnosis not present

## 2024-02-01 DIAGNOSIS — L89102 Pressure ulcer of unspecified part of back, stage 2: Secondary | ICD-10-CM | POA: Diagnosis not present

## 2024-02-01 DIAGNOSIS — L89313 Pressure ulcer of right buttock, stage 3: Secondary | ICD-10-CM | POA: Diagnosis not present

## 2024-02-11 DIAGNOSIS — L820 Inflamed seborrheic keratosis: Secondary | ICD-10-CM | POA: Diagnosis not present

## 2024-02-11 DIAGNOSIS — D2261 Melanocytic nevi of right upper limb, including shoulder: Secondary | ICD-10-CM | POA: Diagnosis not present

## 2024-02-11 DIAGNOSIS — D485 Neoplasm of uncertain behavior of skin: Secondary | ICD-10-CM | POA: Diagnosis not present

## 2024-02-11 DIAGNOSIS — L538 Other specified erythematous conditions: Secondary | ICD-10-CM | POA: Diagnosis not present

## 2024-02-11 DIAGNOSIS — L821 Other seborrheic keratosis: Secondary | ICD-10-CM | POA: Diagnosis not present

## 2024-02-11 DIAGNOSIS — D2262 Melanocytic nevi of left upper limb, including shoulder: Secondary | ICD-10-CM | POA: Diagnosis not present

## 2024-02-11 DIAGNOSIS — Z85828 Personal history of other malignant neoplasm of skin: Secondary | ICD-10-CM | POA: Diagnosis not present

## 2024-02-15 DIAGNOSIS — S91103D Unspecified open wound of unspecified great toe without damage to nail, subsequent encounter: Secondary | ICD-10-CM | POA: Diagnosis not present

## 2024-02-15 DIAGNOSIS — L89103 Pressure ulcer of unspecified part of back, stage 3: Secondary | ICD-10-CM | POA: Diagnosis not present

## 2024-02-15 DIAGNOSIS — L89312 Pressure ulcer of right buttock, stage 2: Secondary | ICD-10-CM | POA: Diagnosis not present

## 2024-02-15 DIAGNOSIS — L89102 Pressure ulcer of unspecified part of back, stage 2: Secondary | ICD-10-CM | POA: Diagnosis not present

## 2024-02-15 DIAGNOSIS — L89313 Pressure ulcer of right buttock, stage 3: Secondary | ICD-10-CM | POA: Diagnosis not present

## 2024-02-16 DIAGNOSIS — I5041 Acute combined systolic (congestive) and diastolic (congestive) heart failure: Secondary | ICD-10-CM | POA: Diagnosis not present

## 2024-02-16 DIAGNOSIS — I38 Endocarditis, valve unspecified: Secondary | ICD-10-CM | POA: Diagnosis not present

## 2024-02-16 DIAGNOSIS — D500 Iron deficiency anemia secondary to blood loss (chronic): Secondary | ICD-10-CM | POA: Diagnosis not present

## 2024-02-16 DIAGNOSIS — M4646 Discitis, unspecified, lumbar region: Secondary | ICD-10-CM | POA: Diagnosis not present

## 2024-02-16 DIAGNOSIS — M5116 Intervertebral disc disorders with radiculopathy, lumbar region: Secondary | ICD-10-CM | POA: Diagnosis not present

## 2024-03-09 DIAGNOSIS — S91103D Unspecified open wound of unspecified great toe without damage to nail, subsequent encounter: Secondary | ICD-10-CM | POA: Diagnosis not present

## 2024-03-09 DIAGNOSIS — L89312 Pressure ulcer of right buttock, stage 2: Secondary | ICD-10-CM | POA: Diagnosis not present

## 2024-03-09 DIAGNOSIS — L89313 Pressure ulcer of right buttock, stage 3: Secondary | ICD-10-CM | POA: Diagnosis not present

## 2024-03-09 DIAGNOSIS — L89102 Pressure ulcer of unspecified part of back, stage 2: Secondary | ICD-10-CM | POA: Diagnosis not present

## 2024-03-09 DIAGNOSIS — L89103 Pressure ulcer of unspecified part of back, stage 3: Secondary | ICD-10-CM | POA: Diagnosis not present

## 2024-04-05 ENCOUNTER — Telehealth: Payer: Self-pay | Admitting: Cardiovascular Disease

## 2024-04-05 NOTE — Telephone Encounter (Signed)
 Pt c/o swelling/edema: STAT if pt has developed SOB within 24 hours  If swelling, where is the swelling located? Rt LE edema knee down to ankle  How much weight have you gained and in what time span? 2-3 lbs in last 3 days  Have you gained 2 pounds in a day or 5 pounds in a week? -  Do you have a log of your daily weights (if so, list)? No  Are you currently taking a fluid pill? Occasionally  Are you currently SOB? No  Have you traveled recently in a car or plane for an extended period of time? No

## 2024-04-05 NOTE — Telephone Encounter (Signed)
 Called patient - left message on voice mail to return call to the office

## 2024-04-07 NOTE — Telephone Encounter (Signed)
 Called patient and left message for call back.

## 2024-04-14 NOTE — Telephone Encounter (Signed)
 Called patient and left message for call back.

## 2024-04-17 DIAGNOSIS — M419 Scoliosis, unspecified: Secondary | ICD-10-CM | POA: Diagnosis not present

## 2024-04-17 DIAGNOSIS — M5030 Other cervical disc degeneration, unspecified cervical region: Secondary | ICD-10-CM | POA: Diagnosis not present

## 2024-04-17 DIAGNOSIS — L89144 Pressure ulcer of left lower back, stage 4: Secondary | ICD-10-CM | POA: Diagnosis not present

## 2024-04-17 DIAGNOSIS — L89522 Pressure ulcer of left ankle, stage 2: Secondary | ICD-10-CM | POA: Diagnosis not present

## 2024-04-17 DIAGNOSIS — M4317 Spondylolisthesis, lumbosacral region: Secondary | ICD-10-CM | POA: Diagnosis not present

## 2024-04-17 DIAGNOSIS — L89152 Pressure ulcer of sacral region, stage 2: Secondary | ICD-10-CM | POA: Diagnosis not present

## 2024-04-17 DIAGNOSIS — L89134 Pressure ulcer of right lower back, stage 4: Secondary | ICD-10-CM | POA: Diagnosis not present

## 2024-04-17 NOTE — Telephone Encounter (Signed)
 Called patient and left message for call back.

## 2024-04-21 DIAGNOSIS — L89313 Pressure ulcer of right buttock, stage 3: Secondary | ICD-10-CM | POA: Diagnosis not present

## 2024-04-21 DIAGNOSIS — L89312 Pressure ulcer of right buttock, stage 2: Secondary | ICD-10-CM | POA: Diagnosis not present

## 2024-04-21 DIAGNOSIS — L89102 Pressure ulcer of unspecified part of back, stage 2: Secondary | ICD-10-CM | POA: Diagnosis not present

## 2024-04-21 DIAGNOSIS — L89103 Pressure ulcer of unspecified part of back, stage 3: Secondary | ICD-10-CM | POA: Diagnosis not present

## 2024-04-21 DIAGNOSIS — S91103D Unspecified open wound of unspecified great toe without damage to nail, subsequent encounter: Secondary | ICD-10-CM | POA: Diagnosis not present

## 2024-04-28 DIAGNOSIS — Z0000 Encounter for general adult medical examination without abnormal findings: Secondary | ICD-10-CM | POA: Diagnosis not present

## 2024-04-28 DIAGNOSIS — L853 Xerosis cutis: Secondary | ICD-10-CM | POA: Diagnosis not present

## 2024-04-29 NOTE — Progress Notes (Unsigned)
 Cardiology Clinic Note   Date: 05/03/2024 ID: Jocelyn Hooper, DOB 1941/05/16, MRN [id number]  Primary Cardiologist:  Evalene Lunger, MD  Chief Complaint   Jocelyn Hooper is a 83 y.o. female who presents to the clinic today for routine follow up.   Patient Profile   Jocelyn Hooper is followed by Dr. Gollan for the history outlined below.      Past medical history significant for: ASCVD. CT cardiac scoring 11/02/2016: Coronary calcium  score 80. Mitral valve insufficiency. Echo 08/12/2023: EF 55 to 60%.  No RWMA.  Mild LVH.  Grade I DD.  Normal RV size/function.  Normal PA pressure.  Mild to moderate mitral valve regurgitation.  Borderline dilatation of ascending aorta 36 mm. Bacteremia. Hypertension. Asthma. GERD.  In summary, patient establish care with Dr. Gollan on 06/29/2014 for DOE.  She reported a history of chest pain in the past with normal cardiac catheterization in May 2003.  She has a strong family history of CAD.  She underwent stress echo that showed possible hypokinesis of the lateral wall with normal EF.  Recommendation for exercise regimen versus cardiac cath if symptoms persisted.  She was lost to follow-up.  She was seen again in January 2018 for 2-day history of exertional chest heaviness radiating to left shoulder and down left arm.  She was not having active chest pain at the time of her visit.  Decision was made to admit patient directly to the hospital for further workup.  Troponin negative x 3.  Echo demonstrated normal LV/RV function, no RWMA, Grade I DD, mildly dilated main pulmonary artery, mildly dilated ascending aorta, mild MR.  Nuclear stress test was a low risk study with small in size mild severity reversible defect involving the apical lateral and mid inferolateral segments that may represent subtle ischemia or artifact.  She was started on low-dose metoprolol  at the time of her discharge.  She followed up in February and reported no further episodes of chest pain.   In September 2019 she was doing well with no cardiac complaints.  She was instructed to follow-up as needed.  Patient was last seen in the office by Dr. Gollan on 04/15/2023 to reestablish care.  She reported dyspnea with over exertion and intermittent lower extremity edema.  BP was well-controlled at the time of her visit.  No changes were made.  Patient presented to the ED on 08/08/2023 for altered mental status.  She had recently been treated for a UTI with a completed course of antibiotic.  That morning she woke with fever and noted strong smelling urine.  Family reported she was having some confusion.  She was normotensive and tachycardic upon arrival.  She was admitted for sepsis.  TEE was performed for bacteremia and showed normal LV/RV function, no LA/LAA thrombus, mild MR, no valvular vegetation, bubble study negative for interatrial shunt.  She was discharged from the hospital on 08/30/2023.     History of Present Illness    Today, patient is accompanied by her husband. No chest pain, pressure, or tightness. No palpitations.  She reports dyspnea with heavier exertion but none with routine activities. This is unchanged from previous. She denies orthopnea or PND. She reports a six week history of right lower extremity edema. She reports multiple dermatologic procedures. She has been wearing compression socks with improvement but returns when she takes them off. No edema to left lower extremity. She restricts dietary sodium. No recent travel. She had back surgery in September 2024. She has  recovered well. She is able to perform light to moderate household activities without limitation.      ROS: All other systems reviewed and are otherwise negative except as noted in History of Present Illness.  EKGs/Labs Reviewed    EKG Interpretation Date/Time:  Wednesday May 03 2024 09:23:07 EDT Ventricular Rate:  73 PR Interval:  184 QRS Duration:  80 QT Interval:  398 QTC Calculation: 438 R  Axis:   -17  Text Interpretation: Normal sinus rhythm Normal ECG When compared with ECG of 08-Aug-2023 10:49, PREVIOUS ECG IS PRESENT Confirmed by Loistine Sober [phone]) on 05/03/2024 9:27:58 AM   10/29/2023: ALT 14; AST 20; BUN 24; Creatinine, Ser 0.81; Potassium 3.4; Sodium 137   10/29/2023: Hemoglobin 10.4; WBC 10.9    Physical Exam    VS:  BP 118/68 (BP Location: Left Arm, Patient Position: Sitting, Cuff Size: Normal)   Pulse 73   Ht 5' 3 (1.6 m)   Wt 116 lb 4 oz (52.7 kg)   SpO2 98%   BMI 20.59 kg/m  , BMI Body mass index is 20.59 kg/m.  GEN: Well nourished, well developed, in no acute distress. Neck: No JVD or carotid bruits. Cardiac:  RRR.  No murmur. No rubs or gallops.   Respiratory:  Respirations regular and unlabored. Clear to auscultation without rales, wheezing or rhonchi. GI: Soft, nontender, nondistended. Extremities: Radials/DP/PT 2+ and equal bilaterally. No clubbing or cyanosis. Mild, nonpitting lower extremity edema. Multiple healed scars (site of dermatologic procedures).   Skin: Warm and dry, no rash. Neuro: Strength intact.  Assessment & Plan   Right lower extremity edema Patient reports a six week history of right lower extremity edema. She denies increased dyspnea. No orthopnea or PND. No recent travel. She had back surgery in September 2024. She is performing light to moderate household activities without limitation. Restricts dietary sodium. She has been managing edema with compression socks and notes improvement but edema returns when she removes the socks. Leg is not painful. She has a history of multiple dermatologic procedures on her leg. She is mild nonpitting edema to the right lower extremity.  - Venous US  of the right lower extremity.   ASCVD CT cardiac scoring February 2018 with calcium  score of 80. No chest pain, pressure or tightness. EKG demonstrates NSR.  - Continue aspirin .   DOE/mitral valve insufficiency Echo November 2024  demonstrated normal LV/RV function, Grade I DD, normal PA pressure, mild to moderate mitral valve regurgitation.  Patient reports dyspnea with heavier exertion and non with routine activities unchanged from previous. She denies orthopnea or PND. She is able to perform light to moderate household activities without limtiation.  - Repeat echo as clinically indicated.   Hypertension BP today 118/68. No headache or dizziness reported.  - Continue Toprol , HCTZ.  Disposition: Venous US  right lower extremity. Return in 1 year or sooner as needed.          Signed, Sober HERO. Jocelyn Witman, DNP, NP-C

## 2024-05-03 ENCOUNTER — Ambulatory Visit
Admission: RE | Admit: 2024-05-03 | Discharge: 2024-05-03 | Disposition: A | Discharge status: Home / Self Care | Source: Ambulatory Visit | Attending: Student | Admitting: Student

## 2024-05-03 ENCOUNTER — Ambulatory Visit (INDEPENDENT_AMBULATORY_CARE_PROVIDER_SITE_OTHER): Admitting: Student

## 2024-05-03 ENCOUNTER — Encounter: Payer: Self-pay | Admitting: Student

## 2024-05-03 ENCOUNTER — Telehealth: Payer: Self-pay | Admitting: Cardiovascular Disease

## 2024-05-03 ENCOUNTER — Other Ambulatory Visit: Payer: Self-pay | Admitting: Student

## 2024-05-03 ENCOUNTER — Ambulatory Visit: Payer: Self-pay | Admitting: Student

## 2024-05-03 ENCOUNTER — Other Ambulatory Visit: Payer: Self-pay | Admitting: Emergency Medicine

## 2024-05-03 VITALS — BP 118/68 | HR 73 | Ht 63.0 in | Wt 116.2 lb

## 2024-05-03 DIAGNOSIS — I340 Nonrheumatic mitral (valve) insufficiency: Secondary | ICD-10-CM

## 2024-05-03 DIAGNOSIS — M7989 Other specified soft tissue disorders: Secondary | ICD-10-CM | POA: Diagnosis not present

## 2024-05-03 DIAGNOSIS — I10 Essential (primary) hypertension: Secondary | ICD-10-CM | POA: Insufficient documentation

## 2024-05-03 DIAGNOSIS — R600 Localized edema: Secondary | ICD-10-CM | POA: Diagnosis not present

## 2024-05-03 DIAGNOSIS — R0602 Shortness of breath: Secondary | ICD-10-CM

## 2024-05-03 DIAGNOSIS — I2510 Atherosclerotic heart disease of native coronary artery without angina pectoris: Secondary | ICD-10-CM | POA: Diagnosis not present

## 2024-05-03 DIAGNOSIS — I82451 Acute embolism and thrombosis of right peroneal vein: Secondary | ICD-10-CM | POA: Diagnosis not present

## 2024-05-03 DIAGNOSIS — Z79899 Other long term (current) drug therapy: Secondary | ICD-10-CM

## 2024-05-03 MED ORDER — APIXABAN 5 MG PO TABS: 5.0000 mg | ORAL_TABLET | Freq: Two times a day (BID) | ORAL | 3 refills | Status: DC

## 2024-05-03 NOTE — Telephone Encounter (Signed)
-----   Message from Barnie Hila sent at 05/03/2024  2:26 PM EDT ----- Please let patient know she has a small blood clot in her right leg. I discussed these results with Dr. Gollan. She will need to start Eliquis  5 mg bid. She is to stop aspirin . I would like her to  come in for CBC in 3 weeks and then return for a follow up visit in 3 months. At that time we will discuss stopping medication versus reducing the dose.   Thank you!  DW  ----- Message ----- From: Interface, Rad Results In Sent: 05/03/2024   1:54 PM EDT To: Barnie Hila, NP

## 2024-05-03 NOTE — Patient Instructions (Signed)
 Medication Instructions:  Your physician recommends that you continue on your current medications as directed. Please refer to the Current Medication list given to you today.   *If you need a refill on your cardiac medications before your next appointment, please call your pharmacy*  Lab Work: None ordered at this time  If you have labs (blood work) drawn today and your tests are completely normal, you will receive your results only by: MyChart Message (if you have MyChart) OR A paper copy in the mail If you have any lab test that is abnormal or we need to change your treatment, we will call you to review the results.  Testing/Procedures: Your physician has requested that you have a lower extremity venous duplex. This test is an ultrasound of the veins in the legs or arms. It looks at venous blood flow that carries blood from the heart to the legs or arms. Allow one hour for a Lower Venous exam. Allow thirty minutes for an Upper Venous exam. There are no restrictions or special instructions.This will take place at [address], [place], Imaging on 1st Floor.  Please note: We ask at that you not bring children with you during ultrasound (echo/ vascular) testing. Due to room size and safety concerns, children are not allowed in the ultrasound rooms during exams. Our front office staff cannot provide observation of children in our lobby area while testing is being conducted. An adult accompanying a patient to their appointment will only be allowed in the ultrasound room at the discretion of the ultrasound technician under special circumstances. We apologize for any inconvenience.    Follow-Up: At [facility], you and your health needs are our priority.  As part of our continuing mission to provide you with exceptional heart care, our providers are all part of one team.  This team includes your primary Cardiologist (physician) and Advanced Practice Providers or APPs (Physician Assistants  and Nurse Practitioners) who all work together to provide you with the care you need, when you need it.  Your next appointment:   1 year(s)  Provider:   Evalene Lunger, MD or Barnie Hila, NP    We recommend signing up for the patient portal called MyChart.  Sign up information is provided on this After Visit Summary.  MyChart is used to connect with patients for Virtual Visits (Telemedicine).  Patients are able to view lab/test results, encounter notes, upcoming appointments, etc.  Non-urgent messages can be sent to your provider as well.   To learn more about what you can do with MyChart, go to [email or web address].

## 2024-05-03 NOTE — Telephone Encounter (Signed)
-----   Message from Mac DELENA Blanch, RN sent at 05/03/2024  2:42 PM EDT -----  Please schedule this patient for a 3 month follow up with Dr. Gollan or Barnie Hila, NP.  ----- Message ----- From: Hila Barnie, NP Sent: 05/03/2024   2:26 PM EDT To: Mac DELENA Blanch, RN  Please let patient know she has a small blood clot in her right leg. I discussed these results with Dr. Gollan. She will need to start Eliquis  5 mg bid. She is to stop aspirin . I would like her to  come in for CBC in 3 weeks and then return for a follow up visit in 3 months. At that time we will discuss stopping medication versus reducing the dose.   Thank you!  DW  ----- Message ----- From: Interface, Rad Results In Sent: 05/03/2024   1:54 PM EDT To: Barnie Hila, NP

## 2024-05-03 NOTE — Telephone Encounter (Signed)
 Called the patient's cell phone, husband's cell phone with no answer.  Was able to reach the patient on the home number.  Relayed the results of the US  and informed her of the need to stop the 81 mg Aspirin  and start Eliquis  5 mg two times daily.  Informed her that she needs to come to the office in about 3 weeks to get some blood work done and call scheduling tomorrow to schedule a 3 month follow up.  Patient asked how long she would have to be on Eliquis .  Explained that would be determined by the lab work and the 3 month follow up visit.  Patient verbalized understanding with all questions and concerns addressed at this time.

## 2024-05-03 NOTE — Telephone Encounter (Signed)
 [facility] calling in regards to STAT Ultrasound results

## 2024-05-03 NOTE — Telephone Encounter (Signed)
 Called the patient's phone, her husband's phone and the home phone.  Did not get an answer at any of them.  Left messages for them to call back and ask for this RN.  Call back number provided.  Order for the CBC in 3 weeks put in.  Prescription for Eliquis  5 mg BID sent to patient's pharmacy ([facility]).  Aspirin  81 mg daily cancelled from patient's medication list.  Message sent to Scheduling to schedule follow up for the patient in 3 months.

## 2024-05-03 NOTE — Telephone Encounter (Signed)
 STAT call from JP of [facility], Ultrasound Dept:  Pt has DVT in right peroneal vein.  At the time of writing this I can see the note that Barnie Hila, NP, has addressed this finding with Dr Gollan and orders.

## 2024-05-03 NOTE — Telephone Encounter (Signed)
 Called the patient's cell phone, spouse's number as well as the home number and was not able to reach.  Call back number left with the message to call back and ask for this RN.

## 2024-05-09 DIAGNOSIS — L89103 Pressure ulcer of unspecified part of back, stage 3: Secondary | ICD-10-CM | POA: Diagnosis not present

## 2024-05-09 DIAGNOSIS — S91103D Unspecified open wound of unspecified great toe without damage to nail, subsequent encounter: Secondary | ICD-10-CM | POA: Diagnosis not present

## 2024-05-09 DIAGNOSIS — L89102 Pressure ulcer of unspecified part of back, stage 2: Secondary | ICD-10-CM | POA: Diagnosis not present

## 2024-05-09 DIAGNOSIS — L89312 Pressure ulcer of right buttock, stage 2: Secondary | ICD-10-CM | POA: Diagnosis not present

## 2024-05-09 DIAGNOSIS — L89313 Pressure ulcer of right buttock, stage 3: Secondary | ICD-10-CM | POA: Diagnosis not present

## 2024-05-10 NOTE — Telephone Encounter (Signed)
-----   Message from Nurse Kaushal P sent at 05/10/2024  8:16 AM EDT ----- Can we make sure this patient is scheduled in 3 months with me?   Thank you!   DW   Thank you.  Abby, RN

## 2024-05-19 DIAGNOSIS — I38 Endocarditis, valve unspecified: Secondary | ICD-10-CM | POA: Diagnosis not present

## 2024-05-19 DIAGNOSIS — M5136 Other intervertebral disc degeneration, lumbar region with discogenic back pain only: Secondary | ICD-10-CM | POA: Diagnosis not present

## 2024-05-19 DIAGNOSIS — M4646 Discitis, unspecified, lumbar region: Secondary | ICD-10-CM | POA: Diagnosis not present

## 2024-05-19 DIAGNOSIS — I5041 Acute combined systolic (congestive) and diastolic (congestive) heart failure: Secondary | ICD-10-CM | POA: Diagnosis not present

## 2024-05-26 ENCOUNTER — Other Ambulatory Visit: Payer: Self-pay | Admitting: Emergency Medicine

## 2024-05-26 DIAGNOSIS — Z79899 Other long term (current) drug therapy: Secondary | ICD-10-CM | POA: Diagnosis not present

## 2024-05-27 ENCOUNTER — Ambulatory Visit: Payer: Self-pay | Admitting: Student

## 2024-05-27 LAB — CBC
Hematocrit: 35 % (ref 34.0–46.6)
Hemoglobin: 11.4 g/dL (ref 11.1–15.9)
MCH: 31.7 pg (ref 26.6–33.0)
MCHC: 32.6 g/dL (ref 31.5–35.7)
MCV: 97 fL (ref 79–97)
Platelets: 297 x10E3/uL (ref 150–450)
RBC: 3.6 x10E6/uL — ABNORMAL LOW (ref 3.77–5.28)
RDW: 13 % (ref 11.7–15.4)
WBC: 14.9 x10E3/uL — ABNORMAL HIGH (ref 3.4–10.8)

## 2024-05-30 ENCOUNTER — Other Ambulatory Visit: Payer: Self-pay | Admitting: Infectious Diseases

## 2024-05-30 DIAGNOSIS — N3090 Cystitis, unspecified without hematuria: Secondary | ICD-10-CM | POA: Diagnosis not present

## 2024-06-02 ENCOUNTER — Encounter: Payer: Self-pay | Admitting: Emergency Medicine

## 2024-06-02 NOTE — Progress Notes (Signed)
 Letter Sent.

## 2024-06-26 ENCOUNTER — Ambulatory Visit: Admitting: Cardiovascular Disease

## 2024-07-11 NOTE — Progress Notes (Unsigned)
 07/12/2024 12:14 PM   Jocelyn Hooper November 03, 1940 [id number]  Referring provider: Cleotilde Oneil FALCON, MD [phone] [facility] [address] [facility] [place],  [place] [address]  Urological history: 1. Urge incontinence -contributing factors of age, GSM, and lumbar DDD, diuretics and pelvic surgery  2. Nephrolithiasis -stone composition calcium  oxalate -ESWL, 12/2020 -CT renal stone study (01/2022) -nonobstructive bilateral nephrolithiasis - CT renal stone (07/2023) -nonobstructive bilateral nephrolithiasis  3. rUTI's -Contributing factors of age, GSM, incontinence and constipation -May 30, 2024-Proteus mirabilis -August 08, 2023-multiple species   Chief Complaint  Patient presents with   Urinary Frequency   HPI: Jocelyn Hooper is a 83 y.o. female who presents today for frequency, urgency, not emptying completely with her daughter, Randine.    Previous records reviewed  She has had an increase in urinary frequency and dysuria over the last week.  She continues to have leakage of urine which she soaks her depends at night.  She also states a week ago she had the sudden onset of left hip pain that radiated to the left groin and then suddenly abated.  Patient denies any modifying or aggravating factors.  Patient denies any recent UTI's, gross hematuria, dysuria or suprapubic/flank pain.  Patient denies any fevers, chills, nausea or vomiting.    UA yellow slightly cloudy, specific gravity 1.025, pH 6.0, trace protein, trace heme, 2+ leuks, greater than 30 WBCs, 0-2 RBCs, 0-10 epithelial cells and many bacteria.  PVR 0 mL   Serum creatinine 0.9 with a eGFR of 63 in June.  She is currently on doxycycline  100 mg twice daily per Dr. Searcy indefinitely.   PMH: Past Medical History:  Diagnosis Date   Bursitis of left shoulder    Chest pain, unspecified    Complication of anesthesia    PT HAS SEVERE SCOLIOSIS AND FOR TKR ANESTHESIA COULD NOT GET A SPINAL    Coronary artery disease    ascvd   CSF leak    DDD (degenerative disc disease), cervical    Diverticulosis    Esophageal reflux    Fibrocystic breast disease    GERD (gastroesophageal reflux disease)    Headache    H/O   History of colon polyps    History of hiatal hernia    History of kidney stones    IBS (irritable bowel syndrome)    Nasal polyps    Other and unspecified hyperlipidemia    Scoliosis    Tinnitus of both ears    Trigeminal neuralgia syndrome    Unspecified sinusitis (chronic)    Vitamin B12 deficiency     Surgical History: Past Surgical History:  Procedure Laterality Date   ABDOMINAL HYSTERECTOMY     APPENDECTOMY     BACK SURGERY  1982   disc   CARDIAC CATHETERIZATION     [facility]    CHOLECYSTECTOMY  2002   COLONOSCOPY WITH PROPOFOL  N/A 03/10/2017   Procedure: COLONOSCOPY WITH PROPOFOL ;  Surgeon: Viktoria Lamar DASEN, MD;  Location: [facility]COPY;  Service: Endoscopy;  Laterality: N/A;   COLONOSCOPY WITH PROPOFOL  N/A 01/13/2021   Procedure: COLONOSCOPY WITH PROPOFOL ;  Surgeon: Toledo, Ladell POUR, MD;  Location: [facility] ENDOSCOPY;  Service: Gastroenterology;  Laterality: N/A;   ESOPHAGOGASTRODUODENOSCOPY (EGD) WITH PROPOFOL  N/A 01/13/2021   Procedure: ESOPHAGOGASTRODUODENOSCOPY (EGD) WITH PROPOFOL ;  Surgeon: Toledo, Ladell POUR, MD;  Location: [facility] ENDOSCOPY;  Service: Gastroenterology;  Laterality: N/A;   EXTRACORPOREAL SHOCK WAVE LITHOTRIPSY Right 01/09/2021   Procedure: EXTRACORPOREAL SHOCK WAVE LITHOTRIPSY (ESWL);  Surgeon: Penne Knee,  MD;  Location: [facility] ORS;  Service: Urology;  Laterality: Right;   EYE SURGERY Bilateral    Cataract Extraction with IOL   hysterectomy (other)  1974   JOINT REPLACEMENT     LEFT knee   KNEE ARTHROPLASTY Right 09/27/2017   Procedure: COMPUTER ASSISTED TOTAL KNEE ARTHROPLASTY;  Surgeon: Mardee Lynwood SQUIBB, MD;  Location: [facility] ORS;  Service: Orthopedics;  Laterality: Right;   KNEE ARTHROSCOPY Right 09/13/2015   Procedure:  ARTHROSCOPY KNEE, PARTIAL LATERAL MENISECTOMY, CHONDROPLASTY ;  Surgeon: Lynwood SQUIBB Mardee, MD;  Location: [facility] ORS;  Service: Orthopedics;  Laterality: Right;   KNEE SURGERY  2004   having a lot of problems with left knee   NASAL SINUS SURGERY  1967, 1988, 1993, 2003, 2012   sinus procedures  1970,1990,1994,2005   TEE WITHOUT CARDIOVERSION N/A 08/11/2023   Procedure: TRANSESOPHAGEAL ECHOCARDIOGRAM (TEE);  Surgeon: Perla Evalene PARAS, MD;  Location: [facility] ORS;  Service: Cardiovascular;  Laterality: N/A;   TONSILLECTOMY  2015   TRANSFORAMINAL LUMBAR INTERBODY FUSION (TLIF) WITH PEDICLE SCREW FIXATION 1 LEVEL N/A 06/09/2023   Procedure: TRANSFOAMINAL LUMBAR INTERBODY FUSION,INTERBODY PROSTHESIS,POSTERIOR INSTRUMENTATION LUMBAR FIVE-SACRAL ONE;  Surgeon: Mavis Purchase, MD;  Location: [facility] OR;  Service: Neurosurgery;  Laterality: N/A;    Home Medications:  Allergies as of 07/12/2024       Reactions   Avelox [moxifloxacin Hcl In Nacl] Other (See Comments)   Redness all over in about 5 minutes after taking   Codeine Nausea Only   Acyclovir Other (See Comments)   Turn red all over   Acyclovir And Related    Turn red all over    Mirtazapine  Itching, Other (See Comments)   Becomes twitchy and myoclonic Confusion.   Shrimp [shellfish Allergy]    Hives    Ciprofloxacin Rash   Moxifloxacin Hives, Rash   Sucralfate Rash   Itching         Medication List        Accurate as of July 12, 2024 12:14 PM. If you have any questions, ask your nurse or doctor.          apixaban  5 MG Tabs tablet Commonly known as: Eliquis  Take 1 tablet (5 mg total) by mouth 2 (two) times daily.   ascorbic acid  500 MG tablet Commonly known as: VITAMIN C  Take 1,000 mg by mouth 2 (two) times daily.   Calcium  Carbonate-Vitamin D  600-400 MG-UNIT tablet Take 1 tablet by mouth 2 (two) times daily.   cetirizine 10 MG tablet Commonly known as: ZYRTEC Take 10 mg by mouth daily.   cyclobenzaprine  5 MG  tablet Commonly known as: FLEXERIL  Take 1 tablet (5 mg total) by mouth 3 (three) times daily as needed for muscle spasms.   daptomycin  500-0.9 MG/50ML-% Soln Commonly known as: CUBICIN  Inject 50 mLs (500 mg total) into the vein daily at 2 PM.   docusate sodium  100 MG capsule Commonly known as: COLACE Take 1 capsule (100 mg total) by mouth 2 (two) times daily.   doxycycline  100 MG tablet Commonly known as: ADOXA TAKE ONE TABLET BY MOUTH TWICE DAILY   estradiol  0.5 MG tablet Commonly known as: ESTRACE  Take 0.5 mg by mouth daily.   fluticasone  50 MCG/ACT nasal spray Commonly known as: FLONASE  Place 1 spray into both nostrils in the morning and at bedtime.   gabapentin  300 MG capsule Commonly known as: NEURONTIN  Take 300 mg by mouth at bedtime.   hydrochlorothiazide 25 MG tablet Commonly known as: HYDRODIURIL Take 25 mg by mouth  as needed.   HYDROcodone -acetaminophen  5-325 MG tablet Commonly known as: NORCO/VICODIN Take by mouth.   lansoprazole 30 MG capsule Commonly known as: PREVACID Take 30 mg by mouth daily.   Melatonin 10 MG Tabs Take 10 mg by mouth at bedtime.   metoCLOPramide  5 MG tablet Commonly known as: REGLAN  Take 5 mg by mouth every 8 (eight) hours as needed for vomiting or nausea.   metoprolol  succinate 25 MG 24 hr tablet Commonly known as: TOPROL -XL Take 1 tablet by mouth daily.   morphine  15 MG 12 hr tablet Commonly known as: MS CONTIN  Take 1 tablet (15 mg total) by mouth every 12 (twelve) hours.   oxyCODONE -acetaminophen  5-325 MG tablet Commonly known as: PERCOCET/ROXICET Take 1 tablet by mouth every 4 (four) hours as needed for moderate pain (pain score 4-6).   sucralfate 1 g tablet Commonly known as: CARAFATE Take 1 g by mouth 4 (four) times daily.   tamsulosin  0.4 MG Caps capsule Commonly known as: FLOMAX  Take 1 capsule (0.4 mg total) by mouth daily after supper.   topiramate  50 MG tablet Commonly known as: TOPAMAX  Take 150 mg by  mouth daily.        Allergies:  Allergies  Allergen Reactions   Avelox [Moxifloxacin Hcl In Nacl] Other (See Comments)    Redness all over in about 5 minutes after taking   Codeine Nausea Only   Acyclovir Other (See Comments)    Turn red all over   Acyclovir And Related     Turn red all over    Mirtazapine  Itching and Other (See Comments)    Becomes twitchy and myoclonic  Confusion.   Shrimp [Shellfish Allergy]     Hives     Ciprofloxacin Rash   Moxifloxacin Hives and Rash   Sucralfate Rash    Itching     Family History: Family History  Problem Relation Age of Onset   Heart attack Mother 7   Heart failure Mother    Heart attack Father    Heart disease Father        CABG   Heart attack Brother 5       premature   Breast cancer Sister 90    Social History:  reports that she has never smoked. She has never used smokeless tobacco. She reports that she does not drink alcohol and does not use drugs.  ROS: Pertinent ROS in HPI  Physical Exam: BP 118/74   Pulse 66   Ht 5' (1.524 m)   Wt 116 lb (52.6 kg)   BMI 22.65 kg/m   Constitutional:  Well nourished. Alert and oriented, No acute distress. HEENT: [place] AT, moist mucus membranes.  Trachea midline Cardiovascular: No clubbing, cyanosis, or edema. Respiratory: Normal respiratory effort, no increased work of breathing. Neurologic: Grossly intact, no focal deficits, moving all 4 extremities. Psychiatric: Normal mood and affect.    Laboratory Data: See Epic and HPI    I have reviewed the labs.   Pertinent Imaging:  07/12/24 11:01  Scan Result 0    Assessment & Plan:    1.  Feeling of incomplete bladder emptying - PVR demonstrates adequate bladder emptying   2. Bilateral nephrolithiasis - ? Of stone passage last week - may need to obtain a CT renal stone protocol if symptoms recur or urine culture is negative  3. Suspected UTI  - UA grossly infected  - Urine culture pending - Since she is already  taking doxycycline  twice daily, we will wait on culture  results before prescribing antibiotic and will also consult with ID for antibiotic guidance   Return for Follow up pending labs.  These notes generated with voice recognition software. I apologize for typographical errors.  CLOTILDA HELON RIGGERS  [facility] [address]  [address] [place], [place] [address] [phone]

## 2024-07-12 ENCOUNTER — Ambulatory Visit: Payer: Self-pay | Admitting: Urology

## 2024-07-12 ENCOUNTER — Ambulatory Visit: Admitting: Urology

## 2024-07-12 ENCOUNTER — Encounter: Payer: Self-pay | Admitting: Urology

## 2024-07-12 VITALS — BP 118/74 | HR 66 | Ht 60.0 in | Wt 116.0 lb

## 2024-07-12 DIAGNOSIS — N200 Calculus of kidney: Secondary | ICD-10-CM

## 2024-07-12 DIAGNOSIS — R3989 Other symptoms and signs involving the genitourinary system: Secondary | ICD-10-CM | POA: Diagnosis not present

## 2024-07-12 DIAGNOSIS — R3914 Feeling of incomplete bladder emptying: Secondary | ICD-10-CM

## 2024-07-12 LAB — URINALYSIS, COMPLETE
Bilirubin, UA: NEGATIVE
Glucose, UA: NEGATIVE
Ketones, UA: NEGATIVE
Nitrite, UA: NEGATIVE
Specific Gravity, UA: 1.025 (ref 1.005–1.030)
Urobilinogen, Ur: 0.2 mg/dL (ref 0.2–1.0)
pH, UA: 6 (ref 5.0–7.5)

## 2024-07-12 LAB — MICROSCOPIC EXAMINATION: WBC, UA: >30 /HPF — AB (ref 0–5)

## 2024-07-12 LAB — BLADDER SCAN AMB NON-IMAGING: Scan Result: 0

## 2024-07-12 NOTE — Addendum Note (Signed)
 Addended by: HELON KIRSCH A on: 07/12/2024 04:46 PM   Modules accepted: Orders

## 2024-07-15 LAB — CULTURE, URINE COMPREHENSIVE

## 2024-07-17 ENCOUNTER — Other Ambulatory Visit: Payer: Self-pay

## 2024-07-17 MED ORDER — CEFDINIR 300 MG PO CAPS: 300.0000 mg | ORAL_CAPSULE | Freq: Two times a day (BID) | ORAL | 0 refills | Status: AC

## 2024-07-17 NOTE — Progress Notes (Signed)
 Called patient, patient understood they will call back in two months to schedule follow up

## 2024-07-18 ENCOUNTER — Ambulatory Visit: Admitting: Urology

## 2024-08-07 NOTE — Progress Notes (Unsigned)
 Cardiology Office Note  Date:  08/08/2024   ID:  Jocelyn Hooper, DOB 04/04/1941, MRN [id number]  PCP:  Jocelyn Oneil FALCON, MD   Chief Complaint  Patient presents with   3 month follow up     Patient c/o shortness of breath with little to no activity & bilateral LE edema.     HPI:  [name] is a 83 year old woman with  hyperlipidemia,  degenerative disc disease, chronic back pain GERD, IBS, history of chest pain with normal cardiac catheterization may 2003 at [facility],  previously seen in our clinic for leg swelling,  Leg neuropathy Previous symptoms of shortness of breath, who presents for follow-up of her leg swelling, shortness of breath  LOV with myself 8/24 Seen by one of our providers August 2025 Had right lower extremity edema Positive for DVT right peroneal vein Started on Eliquis  5 twice daily, has completed 3 months  Has persistent bilateral lower extremity edema, shortness of breath Walks slowly secondary to chronic back pain, gait instability, sometimes uses a cane, no falls  Hurt left leg shin, concerned it could be getting infected, it is not healing Does not want referral to wound clinic  Married, husband helps Gabapentin  at night for pain  On PPI, denies GERD symptoms  Lab work reviewed Total chol 167, LDL 70 Creatinine 0.9 Hemoglobin 11.4 A1c 5.7  Prior imaging reviewed Echocardiogram January 2018 EF 55 to 60%  Echo 7/21: [place] NORMAL LEFT VENTRICULAR SYSTOLIC FUNCTION  NORMAL RIGHT VENTRICULAR SYSTOLIC FUNCTION  MILD VALVULAR REGURGITATION (See above)  NO VALVULAR STENOSIS  Mild/mod MR   CT chest 12/23 Moderate hiatal hernia. Aortic Atherosclerosis  CT coronary calcium  score Score of 80  EKG personally reviewed by myself on todays visit EKG Interpretation Date/Time:  Tuesday August 08 2024 14:14:22 EST Ventricular Rate:  86 PR Interval:  178 QRS Duration:  80 QT Interval:  382 QTC Calculation: 457 R Axis:   -16  Text  Interpretation: Normal sinus rhythm When compared with ECG of 05/03/24 No significant change was found T wave inversion now evident in Anterior leads Confirmed by Perla Lye [phone]) on 08/08/2024 2:16:56 PM    PMH:   has a past medical history of Bursitis of left shoulder, Chest pain, unspecified, Complication of anesthesia, Coronary artery disease, CSF leak, DDD (degenerative disc disease), cervical, Diverticulosis, Esophageal reflux, Fibrocystic breast disease, GERD (gastroesophageal reflux disease), Headache, History of colon polyps, History of hiatal hernia, History of kidney stones, IBS (irritable bowel syndrome), Nasal polyps, Other and unspecified hyperlipidemia, Scoliosis, Tinnitus of both ears, Trigeminal neuralgia syndrome, Unspecified sinusitis (chronic), and Vitamin B12 deficiency.  PSH:    Past Surgical History:  Procedure Laterality Date   ABDOMINAL HYSTERECTOMY     APPENDECTOMY     BACK SURGERY  1982   disc   CARDIAC CATHETERIZATION     [facility]    CHOLECYSTECTOMY  2002   COLONOSCOPY WITH PROPOFOL  N/A 03/10/2017   Procedure: COLONOSCOPY WITH PROPOFOL ;  Surgeon: Viktoria Lamar DASEN, MD;  Location: [facility] ENDOSCOPY;  Service: Endoscopy;  Laterality: N/A;   COLONOSCOPY WITH PROPOFOL  N/A 01/13/2021   Procedure: COLONOSCOPY WITH PROPOFOL ;  Surgeon: Toledo, Ladell POUR, MD;  Location: [facility] ENDOSCOPY;  Service: Gastroenterology;  Laterality: N/A;   ESOPHAGOGASTRODUODENOSCOPY (EGD) WITH PROPOFOL  N/A 01/13/2021   Procedure: ESOPHAGOGASTRODUODENOSCOPY (EGD) WITH PROPOFOL ;  Surgeon: Toledo, Ladell POUR, MD;  Location: [facility] ENDOSCOPY;  Service: Gastroenterology;  Laterality: N/A;   EXTRACORPOREAL SHOCK WAVE LITHOTRIPSY Right 01/09/2021   Procedure: EXTRACORPOREAL SHOCK  WAVE LITHOTRIPSY (ESWL);  Surgeon: Penne Knee, MD;  Location: [facility] ORS;  Service: Urology;  Laterality: Right;   EYE SURGERY Bilateral    Cataract Extraction with IOL   hysterectomy (other)  1974   JOINT REPLACEMENT      LEFT knee   KNEE ARTHROPLASTY Right 09/27/2017   Procedure: COMPUTER ASSISTED TOTAL KNEE ARTHROPLASTY;  Surgeon: Mardee Lynwood SQUIBB, MD;  Location: [facility] ORS;  Service: Orthopedics;  Laterality: Right;   KNEE ARTHROSCOPY Right 09/13/2015   Procedure: ARTHROSCOPY KNEE, PARTIAL LATERAL MENISECTOMY, CHONDROPLASTY ;  Surgeon: Lynwood SQUIBB Mardee, MD;  Location: [facility] ORS;  Service: Orthopedics;  Laterality: Right;   KNEE SURGERY  2004   having a lot of problems with left knee   NASAL SINUS SURGERY  1967, 1988, 1993, 2003, 2012   sinus procedures  1970,1990,1994,2005   TEE WITHOUT CARDIOVERSION N/A 08/11/2023   Procedure: TRANSESOPHAGEAL ECHOCARDIOGRAM (TEE);  Surgeon: Perla Evalene PARAS, MD;  Location: [facility] ORS;  Service: Cardiovascular;  Laterality: N/A;   TONSILLECTOMY  2015   TRANSFORAMINAL LUMBAR INTERBODY FUSION (TLIF) WITH PEDICLE SCREW FIXATION 1 LEVEL N/A 06/09/2023   Procedure: TRANSFOAMINAL LUMBAR INTERBODY FUSION,INTERBODY PROSTHESIS,POSTERIOR INSTRUMENTATION LUMBAR FIVE-SACRAL ONE;  Surgeon: Mavis Purchase, MD;  Location: [facility] OR;  Service: Neurosurgery;  Laterality: N/A;    Current Outpatient Medications  Medication Sig Dispense Refill   apixaban  (ELIQUIS ) 5 MG TABS tablet Take 1 tablet (5 mg total) by mouth 2 (two) times daily. 180 tablet 3   Calcium  Carbonate-Vitamin D  600-400 MG-UNIT tablet Take 1 tablet by mouth 2 (two) times daily.     cetirizine (ZYRTEC) 10 MG tablet Take 10 mg by mouth daily.     docusate sodium  (COLACE) 100 MG capsule Take 1 capsule (100 mg total) by mouth 2 (two) times daily. 30 capsule 0   doxycycline  (ADOXA) 100 MG tablet TAKE ONE TABLET BY MOUTH TWICE DAILY 60 tablet 6   estradiol  (ESTRACE ) 0.5 MG tablet Take 0.5 mg by mouth daily.     fluticasone  (FLONASE ) 50 MCG/ACT nasal spray Place 1 spray into both nostrils in the morning and at bedtime.     gabapentin  (NEURONTIN ) 300 MG capsule Take 300 mg by mouth at bedtime.      hydrochlorothiazide (HYDRODIURIL) 25 MG  tablet Take 25 mg by mouth as needed.     lansoprazole (PREVACID) 30 MG capsule Take 30 mg by mouth daily.     Melatonin 10 MG TABS Take 10 mg by mouth at bedtime.     metoprolol  succinate (TOPROL -XL) 25 MG 24 hr tablet Take 1 tablet by mouth daily.     oxyCODONE -acetaminophen  (PERCOCET/ROXICET) 5-325 MG tablet Take 1 tablet by mouth every 4 (four) hours as needed for moderate pain (pain score 4-6). 30 tablet 0   tamsulosin  (FLOMAX ) 0.4 MG CAPS capsule Take 1 capsule (0.4 mg total) by mouth daily after supper.     topiramate  (TOPAMAX ) 50 MG tablet Take 150 mg by mouth daily.     vitamin C  (ASCORBIC ACID ) 500 MG tablet Take 1,000 mg by mouth 2 (two) times daily.     cefdinir  (OMNICEF ) 300 MG capsule Take 1 capsule (300 mg total) by mouth 2 (two) times daily. (Patient not taking: Reported on 08/08/2024) 14 capsule 0   cyclobenzaprine  (FLEXERIL ) 5 MG tablet Take 1 tablet (5 mg total) by mouth 3 (three) times daily as needed for muscle spasms. (Patient not taking: Reported on 08/08/2024) 30 tablet 0   daptomycin  (CUBICIN ) 500-0.9 MG/50ML-% SOLN Inject 50 mLs (500  mg total) into the vein daily at 2 PM. (Patient not taking: Reported on 08/08/2024)     metoCLOPramide  (REGLAN ) 5 MG tablet Take 5 mg by mouth every 8 (eight) hours as needed for vomiting or nausea. (Patient not taking: Reported on 08/08/2024)     sucralfate (CARAFATE) 1 g tablet Take 1 g by mouth 4 (four) times daily. (Patient not taking: Reported on 08/08/2024)     No current facility-administered medications for this visit.    Allergies:   Avelox [moxifloxacin hcl in nacl], Codeine, Acyclovir, Acyclovir and related, Mirtazapine , Shrimp [shellfish allergy], Ciprofloxacin, Moxifloxacin, and Sucralfate   Social History:  The patient  reports that she has never smoked. She has never used smokeless tobacco. She reports that she does not drink alcohol and does not use drugs.   Family History:   family history includes Breast cancer (age of  onset: 35) in her sister; Heart attack in her father; Heart attack (age of onset: 65) in her brother; Heart attack (age of onset: 46) in her mother; Heart disease in her father; Heart failure in her mother.    Review of Systems: Review of Systems  Constitutional: Negative.   HENT: Negative.    Respiratory: Negative.    Cardiovascular: Negative.   Gastrointestinal: Negative.   Musculoskeletal: Negative.   Neurological: Negative.   Psychiatric/Behavioral: Negative.    All other systems reviewed and are negative.   PHYSICAL EXAM: VS:  BP 104/64 (BP Location: Left Arm, Patient Position: Sitting, Cuff Size: Normal)   Pulse 86   Ht 5' 3 (1.6 m)   Wt 119 lb 8 oz (54.2 kg)   SpO2 96%   BMI 21.17 kg/m  , BMI Body mass index is 21.17 kg/m. Constitutional:  oriented to person, place, and time. No distress.  HENT:  Head: Normocephalic and atraumatic.  Eyes:  no discharge. No scleral icterus.  Neck: Normal range of motion. Neck supple. No JVD present.  Cardiovascular: Normal rate, regular rhythm, normal heart sounds and intact distal pulses. Exam reveals no gallop and no friction rub.  1+ pitting bilateral lower extremity edema, bandage on the left leg No murmur heard. Pulmonary/Chest: Effort normal and breath sounds normal. No stridor. No respiratory distress.  no wheezes.  no rales.  no tenderness.  Abdominal: Soft.  no distension.  no tenderness.  Musculoskeletal: Normal range of motion.  no  tenderness or deformity.  Neurological:  normal muscle tone. Coordination normal. No atrophy Skin: Skin is warm and dry. No rash noted. not diaphoretic.  Psychiatric:  normal mood and affect. behavior is normal. Thought content normal.   Recent Labs: 08/12/2023: Magnesium  2.0 10/29/2023: ALT 14; BUN 24; Creatinine, Ser 0.81; Potassium 3.4; Sodium 137 05/26/2024: Hemoglobin 11.4; Platelets 297    Lipid Panel Lab Results  Component Value Date   CHOL 198 10/07/2016   HDL 75 10/07/2016    LDLCALC 105 (H) 10/07/2016   TRIG 88 10/07/2016      Wt Readings from Last 3 Encounters:  08/08/24 119 lb 8 oz (54.2 kg)  07/12/24 116 lb (52.6 kg)  05/03/24 116 lb 4 oz (52.7 kg)     ASSESSMENT AND PLAN:  Problem List Items Addressed This Visit       Cardiology Problems   ASCVD (arteriosclerotic cardiovascular disease) - Primary   Relevant Orders   EKG 12-Lead (Completed)   Mixed hyperlipidemia   Other Visit Diagnoses       Shortness of breath on exertion  Relevant Orders   EKG 12-Lead (Completed)     Mitral valve insufficiency, unspecified etiology       Relevant Orders   EKG 12-Lead (Completed)     Primary hypertension       Relevant Orders   EKG 12-Lead (Completed)     Edema of right lower extremity           Shortness of breath/leg swelling Sedentary, chronic back pain Worsening leg swelling compared to prior visits Weight up several pounds from several months ago -Recommend she start Lasix  20 mg 4 days a week with potassium 20 mill equivalents 4 days a week -Suggest a trial of the diuretic for 3 weeks -This may also aid in wound healing left leg -Compression hose, leg elevation Likely component of venous insufficiency - If no improvement in leg swelling and shortness of breath after 3 weeks, we may want to hold the Lasix  - If she does have improvement in symptoms, recommend she call our office.  We would order BMP and decide dosing of Lasix  to continue  Chest discomfort Denies having significant chest discomfort on exertion concerning for angina No further testing at this time  Chronic back pain Reports symptoms worse after PT yesterday  Anemia Numbers have improved, hemoglobin greater than 11   Signed, Velinda Lunger, M.D., Ph.D. [facility] [place], [place] [phone]

## 2024-08-08 ENCOUNTER — Ambulatory Visit: Attending: Cardiovascular Disease | Admitting: Cardiovascular Disease

## 2024-08-08 ENCOUNTER — Encounter: Payer: Self-pay | Admitting: Cardiovascular Disease

## 2024-08-08 VITALS — BP 104/64 | HR 86 | Ht 63.0 in | Wt 119.5 lb

## 2024-08-08 DIAGNOSIS — R600 Localized edema: Secondary | ICD-10-CM | POA: Diagnosis not present

## 2024-08-08 DIAGNOSIS — R0602 Shortness of breath: Secondary | ICD-10-CM

## 2024-08-08 DIAGNOSIS — I10 Essential (primary) hypertension: Secondary | ICD-10-CM | POA: Diagnosis not present

## 2024-08-08 DIAGNOSIS — I340 Nonrheumatic mitral (valve) insufficiency: Secondary | ICD-10-CM | POA: Diagnosis not present

## 2024-08-08 DIAGNOSIS — I2510 Atherosclerotic heart disease of native coronary artery without angina pectoris: Secondary | ICD-10-CM

## 2024-08-08 DIAGNOSIS — E782 Mixed hyperlipidemia: Secondary | ICD-10-CM | POA: Diagnosis not present

## 2024-08-08 MED ORDER — FUROSEMIDE 20 MG PO TABS: 20.0000 mg | ORAL_TABLET | ORAL | 1 refills | Status: AC

## 2024-08-08 MED ORDER — POTASSIUM CHLORIDE CRYS ER 10 MEQ PO TBCR: 10.0000 meq | EXTENDED_RELEASE_TABLET | ORAL | 1 refills | Status: AC

## 2024-08-08 MED ORDER — APIXABAN 2.5 MG PO TABS: 2.5000 mg | ORAL_TABLET | Freq: Two times a day (BID) | ORAL | 1 refills | Status: AC

## 2024-08-08 NOTE — Patient Instructions (Addendum)
 Medication Instructions:   Please decrease the eliquis  down to 2.5 mg twice a day  Please take lasix  20 mg 4 days a week with potassium for 3 weeks (see if leg swelling gets better) If swelling no  better in 3 weeks, stop the diuretic   If you need a refill on your cardiac medications before your next appointment, please call your pharmacy.   Lab work: No new labs needed  Testing/Procedures: No new testing needed  Follow-Up: At [facility], you and your health needs are our priority.  As part of our continuing mission to provide you with exceptional heart care, we have created designated Provider Care Teams.  These Care Teams include your primary Cardiologist (physician) and Advanced Practice Providers (APPs -  Physician Assistants and Nurse Practitioners) who all work together to provide you with the care you need, when you need it.  You will need a follow up appointment in 6 months  Providers on your designated Care Team:   Lonni Meager, NP Bernardino Bring, PA-C Cadence [place], [place]  COVID-19 Vaccine Information can be found at: [email or web address] For questions related to vaccine distribution or appointments, please email [email or web address] or call [phone].

## 2024-08-16 DIAGNOSIS — R2681 Unsteadiness on feet: Secondary | ICD-10-CM | POA: Diagnosis not present

## 2024-08-16 DIAGNOSIS — R293 Abnormal posture: Secondary | ICD-10-CM | POA: Diagnosis not present

## 2024-08-16 DIAGNOSIS — M546 Pain in thoracic spine: Secondary | ICD-10-CM | POA: Diagnosis not present

## 2024-08-23 DIAGNOSIS — M546 Pain in thoracic spine: Secondary | ICD-10-CM | POA: Diagnosis not present

## 2024-08-23 DIAGNOSIS — R293 Abnormal posture: Secondary | ICD-10-CM | POA: Diagnosis not present

## 2024-08-23 DIAGNOSIS — R2681 Unsteadiness on feet: Secondary | ICD-10-CM | POA: Diagnosis not present

## 2024-09-11 NOTE — Progress Notes (Signed)
 Jocelyn Hooper                                          MRN: [id number]   09/11/2024   The [facility] Quality Team Specialist reviewed this patient medical record for the purposes of chart review for care gap closure. The following were reviewed: chart review for care gap closure-transition of care:notification of admission, receipt of discharge, patient engagement, and medication reconciliation post discharge.    [facility] Quality Team

## 2024-10-20 ENCOUNTER — Telehealth: Payer: Self-pay

## 2024-10-20 NOTE — Progress Notes (Unsigned)
 Complex Care Management Note Care Guide Note  10/20/2024 Name: Jocelyn Hooper MRN: [id number] DOB: 05-Feb-1941   Complex Care Management Outreach Attempts: An unsuccessful telephone outreach was attempted today to offer the patient information about available complex care management services.  Follow Up Plan:  Additional outreach attempts will be made to offer the patient complex care management information and services.   Encounter Outcome:  No Answer  Debbe Fuse [facility], [facility] Guide  Direct Dial: [phone]  Fax [phone]
